# Patient Record
Sex: Female | Born: 1935
Health system: Southern US, Community
[De-identification: ages and names within clinical notes are randomized; demographics above are authoritative.]

## PROBLEM LIST (undated history)

## (undated) DIAGNOSIS — M1990 Unspecified osteoarthritis, unspecified site: Secondary | ICD-10-CM

## (undated) DIAGNOSIS — M545 Low back pain, unspecified: Secondary | ICD-10-CM

## (undated) DIAGNOSIS — K5900 Constipation, unspecified: Secondary | ICD-10-CM

## (undated) DIAGNOSIS — H04123 Dry eye syndrome of bilateral lacrimal glands: Secondary | ICD-10-CM

## (undated) DIAGNOSIS — M2540 Effusion, unspecified joint: Secondary | ICD-10-CM

## (undated) DIAGNOSIS — M2550 Pain in unspecified joint: Secondary | ICD-10-CM

## (undated) DIAGNOSIS — M810 Age-related osteoporosis without current pathological fracture: Secondary | ICD-10-CM

## (undated) DIAGNOSIS — R011 Cardiac murmur, unspecified: Secondary | ICD-10-CM

## (undated) DIAGNOSIS — E039 Hypothyroidism, unspecified: Secondary | ICD-10-CM

## (undated) DIAGNOSIS — R0602 Shortness of breath: Secondary | ICD-10-CM

## (undated) DIAGNOSIS — Z8709 Personal history of other diseases of the respiratory system: Secondary | ICD-10-CM

## (undated) DIAGNOSIS — F329 Major depressive disorder, single episode, unspecified: Secondary | ICD-10-CM

## (undated) DIAGNOSIS — R351 Nocturia: Secondary | ICD-10-CM

## (undated) DIAGNOSIS — M419 Scoliosis, unspecified: Secondary | ICD-10-CM

## (undated) DIAGNOSIS — T7840XA Allergy, unspecified, initial encounter: Secondary | ICD-10-CM

## (undated) DIAGNOSIS — I10 Essential (primary) hypertension: Secondary | ICD-10-CM

## (undated) DIAGNOSIS — R3915 Urgency of urination: Secondary | ICD-10-CM

## (undated) DIAGNOSIS — K219 Gastro-esophageal reflux disease without esophagitis: Secondary | ICD-10-CM

## (undated) DIAGNOSIS — G8929 Other chronic pain: Secondary | ICD-10-CM

## (undated) DIAGNOSIS — R32 Unspecified urinary incontinence: Secondary | ICD-10-CM

## (undated) DIAGNOSIS — R51 Headache: Secondary | ICD-10-CM

## (undated) DIAGNOSIS — H9319 Tinnitus, unspecified ear: Secondary | ICD-10-CM

## (undated) DIAGNOSIS — Z8719 Personal history of other diseases of the digestive system: Secondary | ICD-10-CM

## (undated) DIAGNOSIS — R350 Frequency of micturition: Secondary | ICD-10-CM

## (undated) DIAGNOSIS — F419 Anxiety disorder, unspecified: Secondary | ICD-10-CM

## (undated) DIAGNOSIS — F32A Depression, unspecified: Secondary | ICD-10-CM

## (undated) DIAGNOSIS — J309 Allergic rhinitis, unspecified: Secondary | ICD-10-CM

## (undated) DIAGNOSIS — R0789 Other chest pain: Secondary | ICD-10-CM

## (undated) HISTORY — PX: ESOPHAGOGASTRODUODENOSCOPY: SHX1529

## (undated) HISTORY — DX: Essential (primary) hypertension: I10

## (undated) HISTORY — PX: CATARACT EXTRACTION W/ INTRAOCULAR LENS  IMPLANT, BILATERAL: SHX1307

## (undated) HISTORY — DX: Gastro-esophageal reflux disease without esophagitis: K21.9

## (undated) HISTORY — DX: Allergic rhinitis, unspecified: J30.9

## (undated) HISTORY — DX: Depression, unspecified: F32.A

## (undated) HISTORY — DX: Major depressive disorder, single episode, unspecified: F32.9

## (undated) HISTORY — DX: Anxiety disorder, unspecified: F41.9

## (undated) HISTORY — PX: KNEE ARTHROSCOPY: SUR90

## (undated) HISTORY — PX: JOINT REPLACEMENT: SHX530

## (undated) HISTORY — DX: Allergy, unspecified, initial encounter: T78.40XA

## (undated) HISTORY — PX: COLONOSCOPY: SHX174

## (undated) HISTORY — PX: TUBAL LIGATION: SHX77

## (undated) HISTORY — PX: SHOULDER ARTHROSCOPY W/ ROTATOR CUFF REPAIR: SHX2400

## (undated) HISTORY — PX: EYE SURGERY: SHX253

## (undated) HISTORY — PX: NASAL SINUS SURGERY: SHX719

---
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
## 1997-12-25 ENCOUNTER — Encounter: Admission: RE | Admit: 1997-12-25 | Discharge: 1998-03-25 | Payer: Self-pay | Admitting: Internal Medicine

## 1998-03-29 ENCOUNTER — Other Ambulatory Visit: Admission: RE | Admit: 1998-03-29 | Discharge: 1998-03-29 | Payer: Self-pay | Admitting: Internal Medicine

## 1999-03-11 ENCOUNTER — Other Ambulatory Visit: Admission: RE | Admit: 1999-03-11 | Discharge: 1999-03-11 | Payer: Self-pay | Admitting: Internal Medicine

## 2000-04-05 ENCOUNTER — Other Ambulatory Visit: Admission: RE | Admit: 2000-04-05 | Discharge: 2000-04-05 | Payer: Self-pay | Admitting: Internal Medicine

## 2000-04-10 ENCOUNTER — Encounter: Payer: Self-pay | Admitting: Internal Medicine

## 2000-04-10 ENCOUNTER — Encounter: Admission: RE | Admit: 2000-04-10 | Discharge: 2000-04-10 | Payer: Self-pay | Admitting: Internal Medicine

## 2000-09-28 ENCOUNTER — Other Ambulatory Visit: Admission: RE | Admit: 2000-09-28 | Discharge: 2000-09-28 | Payer: Self-pay | Admitting: Internal Medicine

## 2001-05-20 ENCOUNTER — Other Ambulatory Visit: Admission: RE | Admit: 2001-05-20 | Discharge: 2001-05-20 | Payer: Self-pay | Admitting: Internal Medicine

## 2001-08-23 ENCOUNTER — Encounter: Payer: Self-pay | Admitting: Internal Medicine

## 2001-08-23 ENCOUNTER — Encounter: Admission: RE | Admit: 2001-08-23 | Discharge: 2001-08-23 | Payer: Self-pay | Admitting: Internal Medicine

## 2002-05-08 ENCOUNTER — Other Ambulatory Visit: Admission: RE | Admit: 2002-05-08 | Discharge: 2002-05-08 | Payer: Self-pay | Admitting: Internal Medicine

## 2002-12-05 ENCOUNTER — Ambulatory Visit (HOSPITAL_COMMUNITY): Admission: RE | Admit: 2002-12-05 | Discharge: 2002-12-05 | Payer: Self-pay | Admitting: Gastroenterology

## 2003-01-10 HISTORY — PX: CARDIAC CATHETERIZATION: SHX172

## 2003-05-11 ENCOUNTER — Other Ambulatory Visit: Admission: RE | Admit: 2003-05-11 | Discharge: 2003-05-11 | Payer: Self-pay | Admitting: Internal Medicine

## 2003-05-13 ENCOUNTER — Encounter: Admission: RE | Admit: 2003-05-13 | Discharge: 2003-05-13 | Payer: Self-pay | Admitting: Internal Medicine

## 2003-05-26 ENCOUNTER — Inpatient Hospital Stay (HOSPITAL_BASED_OUTPATIENT_CLINIC_OR_DEPARTMENT_OTHER): Admission: RE | Admit: 2003-05-26 | Discharge: 2003-05-26 | Payer: Self-pay | Admitting: Cardiovascular Disease

## 2004-06-28 ENCOUNTER — Other Ambulatory Visit: Admission: RE | Admit: 2004-06-28 | Discharge: 2004-06-28 | Payer: Self-pay | Admitting: Internal Medicine

## 2006-11-01 ENCOUNTER — Encounter: Admission: RE | Admit: 2006-11-01 | Discharge: 2006-11-01 | Payer: Self-pay | Admitting: Gastroenterology

## 2007-07-24 ENCOUNTER — Ambulatory Visit: Payer: Self-pay | Admitting: Cardiovascular Disease

## 2007-11-29 ENCOUNTER — Ambulatory Visit: Payer: Self-pay | Admitting: Internal Medicine

## 2007-12-31 ENCOUNTER — Ambulatory Visit: Payer: Self-pay | Admitting: Internal Medicine

## 2008-03-06 ENCOUNTER — Ambulatory Visit: Payer: Self-pay | Admitting: Internal Medicine

## 2008-03-26 ENCOUNTER — Ambulatory Visit: Payer: Self-pay | Admitting: Internal Medicine

## 2008-03-30 ENCOUNTER — Encounter: Admission: RE | Admit: 2008-03-30 | Discharge: 2008-03-30 | Payer: Self-pay | Admitting: Internal Medicine

## 2008-04-09 ENCOUNTER — Ambulatory Visit: Payer: Self-pay | Admitting: Internal Medicine

## 2008-08-20 ENCOUNTER — Ambulatory Visit: Payer: Self-pay | Admitting: Internal Medicine

## 2008-08-21 ENCOUNTER — Encounter: Admission: RE | Admit: 2008-08-21 | Discharge: 2008-08-21 | Payer: Self-pay | Admitting: Internal Medicine

## 2008-08-21 ENCOUNTER — Ambulatory Visit: Payer: Self-pay | Admitting: Internal Medicine

## 2008-08-25 ENCOUNTER — Ambulatory Visit: Payer: Self-pay | Admitting: Internal Medicine

## 2008-11-27 ENCOUNTER — Ambulatory Visit: Payer: Self-pay | Admitting: Internal Medicine

## 2009-04-16 ENCOUNTER — Ambulatory Visit: Payer: Self-pay | Admitting: Internal Medicine

## 2009-05-28 ENCOUNTER — Ambulatory Visit: Payer: Self-pay | Admitting: Internal Medicine

## 2009-10-28 ENCOUNTER — Ambulatory Visit: Payer: Self-pay | Admitting: Internal Medicine

## 2009-11-08 ENCOUNTER — Ambulatory Visit: Payer: Self-pay | Admitting: Internal Medicine

## 2009-11-30 ENCOUNTER — Ambulatory Visit: Payer: Self-pay | Admitting: Internal Medicine

## 2010-05-24 NOTE — Assessment & Plan Note (Signed)
[facility]                            CARDIOLOGY OFFICE NOTE   NAME:Terri Bridges, Terri Bridges                     MRN:          [id number]  DATE:07/24/2007                            DOB:          Jun 04, 1934    REFERRING PHYSICIAN:  [facility]   The patient referred by [facility].   The patient has had some mild dyspnea and was noted to have PACs on EKG.   In talking to the patient, she gets very infrequent palpitations.  She  has not had any in the last 3 months.  When she gets them, they are just  occasional flip-flops, they can be somewhat worse at night.   She has had a previous cardiac workup.  She saw Dr. Daleen Squibb in 2005.  I did  her heart cath.  She had normal coronary arteries.  Her chest pain  syndrome at that time was thought to be noncardiac.   The patient's coronary risk factors include hypertension which she takes  Cardizem for.  She is a nonsmoker and non-diabetic.   FAMILY HISTORY:  Only positive for hypertension not premature coronary  disease.   The patient's dyspnea seems functional.  She has actually raised her 21-  year-old granddaughter who is a left lower extremity amputee.  She has  not had PND or orthopnea.  There has been no lower extremity edema.   She has not had any syncope.   REVIEW OF SYSTEMS:  Otherwise negative.   PAST MEDICAL HISTORY:  Remarkable for history of hypertension, history  of hypothyroidism, history of anxiety, and depression.   The patient has a history of reflux.   PAST SURGICAL HISTORY:  She has had previous sinus surgery.   Family history is remarkable for hypertension on both sides of the  family.   I reviewed her notes from Dr. wall and my own heart cath from May 26, 2003.  The patient had normal LV function, normal coronary arteries with  an LVDP of 9.   The patient lives by herself.  She has raised her 74 year old  granddaughter by herself.  The granddaughter's  parents are not involved  with her care and the father is in jail.  The patient does have six  children total.  She is going to visit one of her sons in [place]  for the next 2-3 weeks.   ALLERGIES:  She has no known allergies.   MEDICATIONS:  1. Sertraline 100 a day.  2. Omeprazole 20 a day.  3. Cardizem 240 a day.  4. Synthroid 150 mcg a day.   The patient is anxious to get back to the care of Dr. Lenord Fellers.  Apparently, there was a transition period where Dr. Lenord Fellers was no longer  taking Medicare; however, since she is part of the extended [facility]  group now, I think that the patient go back to seeing her in October  2009.   PHYSICAL EXAMINATION:  GENERAL:  Her exam is remarkable for healthy-  appearing elderly white female in no distress.  Affect appropriate.  VITAL SIGNS:  Blood pressure is equal  to 140/80, pulse 70 and regular,  no PACs, respiratory rate 14, afebrile, weight 190.  HEENT:  Unremarkable.  NECK:  Carotids are normal without bruit.  No lymphadenopathy,  thyromegaly, or JVP elevation.  LUNGS:  Clear.  Good diaphragmatic motion.  No wheezing.  S1 and S2.  Normal heart sounds.  PMI normal.  ABDOMEN:  Benign.  Bowel sounds positive.  No AAA, no tenderness, no  bruit, no hepatosplenomegaly, no hepatojugular reflux, no tenderness.  Distal pulses were intact.  NEURO:  Nonfocal.  SKIN:  Warm and dry.  MUSCULOSKELETAL:  No muscular weakness.   EKG shows sinus rhythm with poor R-wave progression.   There has been no change from her EKG done in June 2005.   I reviewed lab work done at [facility].   The patient's LDL was 106, crit was 38.7, serum glucose was 94,  creatinine was 0.85   Liver function tests appeared normal.  TSH was 0.199.  Apparently, the  patient's Synthroid dose was thus decreased to 75 mcg a day.   IMPRESSION:  1. Premature atrial contractions and/or palpitations.  The patient has      a history of benign palpitations.   Premature atrial contractions      may be exacerbated by inappropriate Synthroid dose.  We will leave      this up to Dr. Lenord Fellers and her primary medical doctors to adjust      Synthroid based on normalizing TSH.  The patient does not need      further workup or event monitoring.  2. Dyspnea, functional.  The patient is overweight.  There is no      evidence of cardiopulmonary disease.  She has a normal exam.      Recommended increased exercise levels.  3. History of anxiety/depression.  Continue sertraline, reactive      secondary to issues regarding raising her granddaughter.  4. History of reflux.  Continue omeprazole.  Avoid late-night meals.      Encourage weight loss.  5. Hypertension.  Continue Cardizem 240 a day, currently seems under      good control.   Overall, I think the patient's cardiac status is stable.  I do not think  her palpitations, dyspnea, or PACs reflect underlying cardiac disease.  We have normal cath and no evidence of structural heart disease as  recent as in 2005.   The patient will try to get back in with Dr. Lenord Fellers regarding her  primary care needs.     Noralyn Pick. Eden Emms, MD, [facility]  Electronically Signed    PCN/MedQ  DD: 07/24/2007  DT: 07/24/2007  Job #: [id number]

## 2010-05-27 NOTE — Cardiovascular Report (Signed)
NAME:  Terri Bridges, Terri Bridges                        ACCOUNT NO.:  [id number]   MEDICAL RECORD NO.:  [id number]                   PATIENT TYPE:  OIB   LOCATION:  6501                                 FACILITY:  [facility]   PHYSICIAN:  Charlton Haws, M.D.                  DATE OF BIRTH:  1934-06-05   DATE OF PROCEDURE:  05/26/2003  DATE OF DISCHARGE:                              CARDIAC CATHETERIZATION   INDICATION:  The patient has had recurrent chest pain and dyspnea.   PROCEDURE:  Standard catheterization was done from the right femoral artery.   The left main coronary artery was normal.   The left anterior descending artery was a very large artery with two large  diagonal branches.  It was normal.   The circumflex coronary artery was also large.  There were two large obtuse  marginal branches.  They were normal.   The right coronary artery was large with two large posterior lateral  branches.  It was normal.   RAO VENTRICULOGRAPHY:  RAO ventriculography was normal.  Ejection fraction  was hyperdynamic in the 65% range. There was no gradient across the aortic  valve and no MR.  Aortic pressure was 144/74, LV pressure was in the 150/9  range.   IMPRESSION:  The patient's chest pain would appear to be noncardiac in  etiology.   She will follow up with Dr. Lenord Fellers in regards to further care.  She will be  discharged in about three hours as long as her groin heals well.                                               Charlton Haws, M.D.    PN/MEDQ  D:  05/26/2003  T:  05/26/2003  Job:  [id number]   cc:   Luanna Cole. Lenord Fellers, M.D.  [address], [place]  [place]  [place] [address]  Fax: [phone]   Jesse Sans. Wall, M.D.

## 2010-05-27 NOTE — Op Note (Signed)
NAME:  Terri Bridges, Terri Bridges                    ACCOUNT NO.:  [id number]   MEDICAL RECORD NO.:  [id number]                   PATIENT TYPE:  AMB   LOCATION:  ENDO                                 FACILITY:  [facility]   PHYSICIAN:  James L. Malon Kindle., M.D.          DATE OF BIRTH:  [birth year]   DATE OF PROCEDURE:  12/05/2002  DATE OF DISCHARGE:                                 OPERATIVE REPORT   PROCEDURE:  Esophagogastroduodenoscopy.   MEDICATIONS:  Fentanyl 50 mcg and Versed 5 mg IV.   INDICATIONS:  The patient with a history of asthma, persistent esophageal  reflux, has tried multiple medications.  Continues to have burning.  Takes a  lot of [address]  She has never had an endoscopy.  This is done as part of an  evaluation for reflux.   DESCRIPTION OF PROCEDURE:  The procedure explained to the patient and  consent obtained.  With the patient in the left lateral decubitus position,  the Olympus scope was then inserted and advanced.  We advanced into the  stomach.  The pylorus was identified and passed.  The bulb and second  duodenum were completely normal.  The scope was withdrawn, and the port and  channel was normal.  The antrum and body were normal.  The fundus and cardia  were seen well on the retroflexed view and were normal.  The diaphragmatic  hiatus was located at 39 cm.  There was a large hiatal hernia estimated to  be about 9 cm with the Z-line at approximately 30 cm.  There were several  erosions in the hiatal hernia sac, consistent with esophageal reflux.  The  distal and proximal esophagus were endoscopically normal.  The scope was  withdrawn.  The patient tolerated the procedure well.   ASSESSMENT:  Large hiatal hernia with erosions secondary to gastroesophageal  reflux disease, code 530.81.   PLAN:  Will give a reflux instruction sheet.  Given Nexium b.i.d. and Reglan  q.i.d. and see back in the office in three months.  If her symptoms are not  controllable, we will discuss  an antireflux operation with her at that time.                                               James L. Malon Kindle., M.D.    Waldron Session  D:  12/05/2002  T:  12/05/2002  Job:  [id number]   cc:   Jessica Priest, M.D.  [address]  [place] [address]  Fax: [phone]   Luanna Cole. Lenord Fellers, M.D.  [address] [place]  [place]  [place] [address]  Fax: [phone]

## 2010-06-02 ENCOUNTER — Other Ambulatory Visit: Payer: Self-pay

## 2010-06-03 ENCOUNTER — Encounter: Payer: Self-pay | Admitting: Internal Medicine

## 2010-06-03 ENCOUNTER — Ambulatory Visit (INDEPENDENT_AMBULATORY_CARE_PROVIDER_SITE_OTHER): Payer: Medicare Other | Admitting: Internal Medicine

## 2010-06-03 VITALS — BP 136/84 | HR 84 | Temp 98.1°F | Wt 164.0 lb

## 2010-06-03 DIAGNOSIS — F329 Major depressive disorder, single episode, unspecified: Secondary | ICD-10-CM

## 2010-06-03 DIAGNOSIS — F411 Generalized anxiety disorder: Secondary | ICD-10-CM

## 2010-06-03 DIAGNOSIS — E039 Hypothyroidism, unspecified: Secondary | ICD-10-CM

## 2010-06-03 DIAGNOSIS — F419 Anxiety disorder, unspecified: Secondary | ICD-10-CM

## 2010-06-03 MED ORDER — SERTRALINE HCL 100 MG PO TABS: 100.0000 mg | ORAL_TABLET | Freq: Every day | ORAL | Status: DC

## 2010-06-03 MED ORDER — LEVOTHYROXINE SODIUM 150 MCG PO TABS: 150.0000 ug | ORAL_TABLET | Freq: Every day | ORAL | Status: DC

## 2010-06-03 MED ORDER — ALPRAZOLAM 0.25 MG PO TABS: 0.5000 mg | ORAL_TABLET | Freq: Two times a day (BID) | ORAL | Status: DC | PRN

## 2010-06-03 NOTE — Patient Instructions (Signed)
Restart Xanax and Zoloft. Continue all other meds. See again in 6 months. Call Orthopedist about shoulder pain.

## 2010-06-03 NOTE — Progress Notes (Signed)
Addended by: Daisy Blossom on: 06/03/2010 12:49 PM   Modules accepted: Orders

## 2010-06-03 NOTE — Progress Notes (Signed)
  Subjective:    Patient ID: Terri Bridges, female    DOB: May 24, 1934, 76 y.o.   MRN: [id number]  HPI patient is here for six-month followup. Blood pressure under reasonably good control with 2 agents listed. She has hypothyroidism. TSH drawn today. Continues to have issues with benign positional vertigo. She  is now of Zoloft and Xanax. 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 Continues to take omeprazole for GE reflux. Has shoulder pain. Needs to contact orthopedist for medication for that issue. Has had shoulder surgery in the recent past.    Review of Systems     Objective:   Physical Exam neck: No thyromegaly chest: Clear to auscultation cardiac: Regular rate and rhythm normal S1 and S2 extremities: Without edema        Assessment & Plan:    #1 hypothyroidism-TSH pending  #2 GE reflux-continue omeprazole  #3 benign positional vertigo-renew Xanax        #4 depression-represcribed Zoloft  #5 hypertension-continue current medications  Return in 6 months for physical examination

## 2010-07-29 ENCOUNTER — Other Ambulatory Visit: Payer: Self-pay | Admitting: Internal Medicine

## 2010-09-16 ENCOUNTER — Other Ambulatory Visit: Payer: Self-pay | Admitting: Specialist

## 2010-09-16 ENCOUNTER — Encounter (HOSPITAL_COMMUNITY): Payer: Medicare Other

## 2010-09-16 ENCOUNTER — Ambulatory Visit (HOSPITAL_COMMUNITY)
Admission: RE | Admit: 2010-09-16 | Discharge: 2010-09-16 | Disposition: A | Discharge status: Home / Self Care | Payer: Medicare Other | Source: Ambulatory Visit | Attending: Specialist | Admitting: Specialist

## 2010-09-16 ENCOUNTER — Other Ambulatory Visit (HOSPITAL_COMMUNITY): Payer: Self-pay | Admitting: Specialist

## 2010-09-16 DIAGNOSIS — Z01812 Encounter for preprocedural laboratory examination: Secondary | ICD-10-CM | POA: Insufficient documentation

## 2010-09-16 DIAGNOSIS — M19019 Primary osteoarthritis, unspecified shoulder: Secondary | ICD-10-CM | POA: Insufficient documentation

## 2010-09-16 DIAGNOSIS — X58XXXA Exposure to other specified factors, initial encounter: Secondary | ICD-10-CM | POA: Insufficient documentation

## 2010-09-16 DIAGNOSIS — R9431 Abnormal electrocardiogram [ECG] [EKG]: Secondary | ICD-10-CM | POA: Insufficient documentation

## 2010-09-16 DIAGNOSIS — S43429A Sprain of unspecified rotator cuff capsule, initial encounter: Secondary | ICD-10-CM | POA: Insufficient documentation

## 2010-09-16 DIAGNOSIS — Z01818 Encounter for other preprocedural examination: Secondary | ICD-10-CM

## 2010-09-16 DIAGNOSIS — Z01810 Encounter for preprocedural cardiovascular examination: Secondary | ICD-10-CM | POA: Insufficient documentation

## 2010-09-16 LAB — CBC
MCH: 26.2 pg (ref 26.0–34.0)
MCHC: 32.1 g/dL (ref 30.0–36.0)
MCV: 81.7 fL (ref 78.0–100.0)
Platelets: 196 10*3/uL (ref 150–400)
RBC: 4.58 MIL/uL (ref 3.87–5.11)
RDW: 13.9 % (ref 11.5–15.5)

## 2010-09-16 LAB — BASIC METABOLIC PANEL
CO2: 31 mEq/L (ref 19–32)
Calcium: 10 mg/dL (ref 8.4–10.5)
Creatinine, Ser: 1.41 mg/dL — ABNORMAL HIGH (ref 0.50–1.10)

## 2010-09-16 LAB — SURGICAL PCR SCREEN: MRSA, PCR: NEGATIVE

## 2010-09-22 ENCOUNTER — Ambulatory Visit (HOSPITAL_COMMUNITY)
Admission: RE | Admit: 2010-09-22 | Discharge: 2010-09-24 | Disposition: A | Discharge status: Home / Self Care | Payer: Medicare Other | Source: Ambulatory Visit | Attending: Specialist | Admitting: Specialist

## 2010-09-22 DIAGNOSIS — I10 Essential (primary) hypertension: Secondary | ICD-10-CM | POA: Insufficient documentation

## 2010-09-22 DIAGNOSIS — X58XXXA Exposure to other specified factors, initial encounter: Secondary | ICD-10-CM | POA: Insufficient documentation

## 2010-09-22 DIAGNOSIS — Z79899 Other long term (current) drug therapy: Secondary | ICD-10-CM | POA: Insufficient documentation

## 2010-09-22 DIAGNOSIS — M19019 Primary osteoarthritis, unspecified shoulder: Secondary | ICD-10-CM | POA: Insufficient documentation

## 2010-09-22 DIAGNOSIS — J45909 Unspecified asthma, uncomplicated: Secondary | ICD-10-CM | POA: Insufficient documentation

## 2010-09-22 DIAGNOSIS — Z01812 Encounter for preprocedural laboratory examination: Secondary | ICD-10-CM | POA: Insufficient documentation

## 2010-09-22 DIAGNOSIS — S43429A Sprain of unspecified rotator cuff capsule, initial encounter: Secondary | ICD-10-CM | POA: Insufficient documentation

## 2010-09-22 LAB — BASIC METABOLIC PANEL
Calcium: 9.1 mg/dL (ref 8.4–10.5)
GFR calc Af Amer: 46 mL/min — ABNORMAL LOW (ref >60)
GFR calc non Af Amer: 38 mL/min — ABNORMAL LOW (ref >60)
Glucose, Bld: 99 mg/dL (ref 70–99)
Potassium: 3.9 mEq/L (ref 3.5–5.1)
Sodium: 142 mEq/L (ref 135–145)

## 2010-09-23 LAB — BASIC METABOLIC PANEL
Chloride: 103 mEq/L (ref 96–112)
GFR calc Af Amer: >60 mL/min (ref >60)
GFR calc non Af Amer: 52 mL/min — ABNORMAL LOW (ref >60)
Potassium: 3.5 mEq/L (ref 3.5–5.1)
Sodium: 138 mEq/L (ref 135–145)

## 2010-09-30 NOTE — Op Note (Signed)
Terri Bridges, Terri Bridges NO.:  [id number]  MEDICAL RECORD NO.:  [id number]  LOCATION:  DAYL                         FACILITY:  [facility]  PHYSICIAN:  Jene Every, M.D.    DATE OF BIRTH:  10/11/1934  DATE OF PROCEDURE:  09/22/2010 DATE OF DISCHARGE:                              OPERATIVE REPORT   PREOPERATIVE DIAGNOSES: 1. Rotator cuff tear, right shoulder. 2. Acromioclavicular joint arthrosis.  POSTOPERATIVE DIAGNOSES: 1. Rotator cuff tear, right shoulder. 2. Acromioclavicular joint arthrosis.  PROCEDURES PERFORMED: 1. Mini-open rotator cuff repair and subscapular tendon with a     JuggerKnot suture anchor. 2. Subacromial decompression and distal clavicle resection.  ANESTHESIA:  General.  ASSISTANT:  Roma Schanz, PA  BRIEF HISTORY:  A 76 year old with shoulder pain, retracted tear of the rotator cuff, severe AC arthrosis; indicated for subacromial decompression, repair of the rotator cuff, distal clavicle resection. Discussed the risks and benefits including bleeding, infection, no change in symptoms, worsening symptoms, need for protracted recovery, DVT, PE, anesthetic complications, etc.  TECHNIQUE:  The patient in supine beach-chair position, after induction of adequate general anesthesia and 1 g Kefzol, left shoulder was prepped and draped in the usual sterile fashion.  She actually had good range of motion in the exam.  Made an incision over the anterolateral aspect of the acromion towards the [facility] joint.  Attention turned towards first the [facility].  We incised the capsule; preserved it; skeletonized the distal clavicle; protected anteriorly, posteriorly, and inferiorly with mini Hohmann retractors.  Used an oscillating saw to remove a centimeter of the distal clavicle, which was severely arthritic.  There were degenerative changes of synovial fluid.  We performed a distal clavicle resection, preserving the coracoacromial ligaments.  Then irrigated  the joint and placed bone wax on the cancellous surface.  Next, I repaired the capsule given the deltotrapezial fascia with 2-0 Vicryl simple sutures.  I then turned attention to the raphe between the anterolateral heads of the deltoid and divided a centimeter and half of the anterolateral aspect of the acromion, placed a deep retractor. Subperiosteally elevated the deltoid off the anterolateral and anteromedial aspect of the acromion, preserving all its attachment.  I removed a spur with a 3 mm Kerrison.  Preserved the AC ligament, looked through the CA ligament.  I irrigated the joint and lavaged it.  There was complete absence of the rotator cuff tendon on the humeral head other than some portion of the subscap.  We thoroughly inspected the joint.  We were able to find the retracted supraspinatus.  Unable to find the infraspinatus.  We advanced the portion of the tendon only that was available which was the subscap.  No significant tear of the upper portion of the subscap.  Biceps tendon was ruptured, not evident. Repaired the subscap tendon utilizing a JuggerKnot suture anchor just medial to the bicipital groove after preparing it with A.O. elevator and a Beyer rongeur to good bleeding tissue.  Mobilized the subscap in the neutral position and placed a suture anchor in this position, threaded both of the suture up through the subscap tendon and with the subscap tendon held in the neutral position, we secured it with 2  good surgical knots.  Felt this completed the repair of the subscap in that neutral position.  Again, the remainder of the cuff was unrepairable.  Wound copiously irrigated.  Following this, I then repaired the raphe with #1 Vicryl interrupted figure-of-8 sutures over the top and through the acromion where I performed a bursectomy as well.  Subcu with 2-0 Vicryl simple sutures.  Skin was reapproximated with 4-0 subcuticular Prolene. Wound reinforced with Steri-Strips.   Sterile dressing applied, placed an abduction pillow, extubated without difficulty, and transported to the recovery room in satisfactory condition.  The patient tolerated the procedure well.  No complications.  Minimal blood loss.     Jene Every, M.D.     Cordelia Pen  D:  09/22/2010  T:  09/22/2010  Job:  [id number]  Electronically Signed by Jene Every M.D. on 09/30/2010 03:10:53 PM

## 2010-12-06 ENCOUNTER — Other Ambulatory Visit: Payer: Medicare Other | Admitting: Internal Medicine

## 2010-12-06 DIAGNOSIS — E039 Hypothyroidism, unspecified: Secondary | ICD-10-CM

## 2010-12-06 DIAGNOSIS — E559 Vitamin D deficiency, unspecified: Secondary | ICD-10-CM

## 2010-12-06 DIAGNOSIS — I10 Essential (primary) hypertension: Secondary | ICD-10-CM

## 2010-12-06 LAB — CBC WITH DIFFERENTIAL/PLATELET
Basophils Relative: 1 % (ref 0–1)
HCT: 37.3 % (ref 36.0–46.0)
Hemoglobin: 11.9 g/dL — ABNORMAL LOW (ref 12.0–15.0)
Lymphocytes Relative: 24 % (ref 12–46)
Monocytes Absolute: 0.5 10*3/uL (ref 0.1–1.0)
Monocytes Relative: 9 % (ref 3–12)
Neutro Abs: 3.2 10*3/uL (ref 1.7–7.7)
Neutrophils Relative %: 59 % (ref 43–77)
RBC: 4.5 MIL/uL (ref 3.87–5.11)
WBC: 5.4 10*3/uL (ref 4.0–10.5)

## 2010-12-06 LAB — COMPREHENSIVE METABOLIC PANEL
AST: 18 U/L (ref 0–37)
Albumin: 4 g/dL (ref 3.5–5.2)
Alkaline Phosphatase: 63 U/L (ref 39–117)
BUN: 25 mg/dL — ABNORMAL HIGH (ref 6–23)
Calcium: 9.4 mg/dL (ref 8.4–10.5)
Chloride: 104 mEq/L (ref 96–112)
Glucose, Bld: 84 mg/dL (ref 70–99)
Potassium: 4.4 mEq/L (ref 3.5–5.3)
Sodium: 144 mEq/L (ref 135–145)
Total Protein: 6.1 g/dL (ref 6.0–8.3)

## 2010-12-06 LAB — LIPID PANEL
HDL: 64 mg/dL (ref >39)
LDL Cholesterol: 145 mg/dL — ABNORMAL HIGH (ref 0–99)
Triglycerides: 74 mg/dL (ref <150)

## 2010-12-07 LAB — VITAMIN D 25 HYDROXY (VIT D DEFICIENCY, FRACTURES): Vit D, 25-Hydroxy: 37 ng/mL (ref 30–89)

## 2010-12-08 ENCOUNTER — Ambulatory Visit (INDEPENDENT_AMBULATORY_CARE_PROVIDER_SITE_OTHER): Payer: Medicare Other | Admitting: Internal Medicine

## 2010-12-08 ENCOUNTER — Encounter: Payer: Self-pay | Admitting: Internal Medicine

## 2010-12-08 VITALS — BP 130/82 | HR 68 | Temp 98.3°F | Ht 61.5 in | Wt 156.0 lb

## 2010-12-08 DIAGNOSIS — M8580 Other specified disorders of bone density and structure, unspecified site: Secondary | ICD-10-CM

## 2010-12-08 DIAGNOSIS — J329 Chronic sinusitis, unspecified: Secondary | ICD-10-CM

## 2010-12-08 DIAGNOSIS — I10 Essential (primary) hypertension: Secondary | ICD-10-CM

## 2010-12-08 DIAGNOSIS — Z8719 Personal history of other diseases of the digestive system: Secondary | ICD-10-CM

## 2010-12-08 DIAGNOSIS — J449 Chronic obstructive pulmonary disease, unspecified: Secondary | ICD-10-CM

## 2010-12-08 DIAGNOSIS — F329 Major depressive disorder, single episode, unspecified: Secondary | ICD-10-CM

## 2010-12-08 DIAGNOSIS — R7989 Other specified abnormal findings of blood chemistry: Secondary | ICD-10-CM

## 2010-12-08 DIAGNOSIS — J309 Allergic rhinitis, unspecified: Secondary | ICD-10-CM

## 2010-12-08 DIAGNOSIS — E039 Hypothyroidism, unspecified: Secondary | ICD-10-CM

## 2010-12-08 DIAGNOSIS — F3289 Other specified depressive episodes: Secondary | ICD-10-CM

## 2010-12-08 DIAGNOSIS — R799 Abnormal finding of blood chemistry, unspecified: Secondary | ICD-10-CM

## 2010-12-08 DIAGNOSIS — J4489 Other specified chronic obstructive pulmonary disease: Secondary | ICD-10-CM

## 2010-12-08 DIAGNOSIS — M899 Disorder of bone, unspecified: Secondary | ICD-10-CM

## 2010-12-08 LAB — POCT URINALYSIS DIPSTICK
Glucose, UA: NEGATIVE
Nitrite, UA: NEGATIVE
Protein, UA: NEGATIVE
Urobilinogen, UA: NEGATIVE

## 2010-12-09 ENCOUNTER — Other Ambulatory Visit: Payer: Self-pay | Admitting: Internal Medicine

## 2010-12-20 ENCOUNTER — Other Ambulatory Visit: Payer: Medicare Other | Admitting: Internal Medicine

## 2010-12-20 DIAGNOSIS — I10 Essential (primary) hypertension: Secondary | ICD-10-CM

## 2010-12-20 LAB — BASIC METABOLIC PANEL
Calcium: 9.1 mg/dL (ref 8.4–10.5)
Potassium: 4.5 mEq/L (ref 3.5–5.3)
Sodium: 144 mEq/L (ref 135–145)

## 2010-12-22 ENCOUNTER — Encounter: Payer: Self-pay | Admitting: Internal Medicine

## 2010-12-22 ENCOUNTER — Ambulatory Visit (INDEPENDENT_AMBULATORY_CARE_PROVIDER_SITE_OTHER): Payer: Medicare Other | Admitting: Internal Medicine

## 2010-12-22 VITALS — BP 188/44 | HR 88 | Temp 99.0°F | Resp 24 | Wt 156.0 lb

## 2010-12-22 DIAGNOSIS — R7989 Other specified abnormal findings of blood chemistry: Secondary | ICD-10-CM

## 2010-12-22 DIAGNOSIS — R799 Abnormal finding of blood chemistry, unspecified: Secondary | ICD-10-CM

## 2010-12-22 DIAGNOSIS — I10 Essential (primary) hypertension: Secondary | ICD-10-CM

## 2011-01-05 ENCOUNTER — Ambulatory Visit (INDEPENDENT_AMBULATORY_CARE_PROVIDER_SITE_OTHER): Payer: Medicare Other | Admitting: Internal Medicine

## 2011-01-05 ENCOUNTER — Other Ambulatory Visit: Payer: Self-pay | Admitting: Internal Medicine

## 2011-01-05 ENCOUNTER — Encounter: Payer: Self-pay | Admitting: Internal Medicine

## 2011-01-05 VITALS — BP 150/86 | HR 88 | Temp 98.4°F | Wt 158.0 lb

## 2011-01-05 DIAGNOSIS — M949 Disorder of cartilage, unspecified: Secondary | ICD-10-CM

## 2011-01-05 DIAGNOSIS — F329 Major depressive disorder, single episode, unspecified: Secondary | ICD-10-CM

## 2011-01-05 DIAGNOSIS — E039 Hypothyroidism, unspecified: Secondary | ICD-10-CM

## 2011-01-05 DIAGNOSIS — M8580 Other specified disorders of bone density and structure, unspecified site: Secondary | ICD-10-CM

## 2011-01-05 DIAGNOSIS — K219 Gastro-esophageal reflux disease without esophagitis: Secondary | ICD-10-CM

## 2011-01-05 DIAGNOSIS — J309 Allergic rhinitis, unspecified: Secondary | ICD-10-CM | POA: Insufficient documentation

## 2011-01-05 DIAGNOSIS — J329 Chronic sinusitis, unspecified: Secondary | ICD-10-CM | POA: Insufficient documentation

## 2011-01-05 DIAGNOSIS — I10 Essential (primary) hypertension: Secondary | ICD-10-CM

## 2011-01-05 NOTE — Progress Notes (Signed)
  Subjective:    Patient ID: Terri Bridges, female    DOB: 1934/02/03, 76 y.o.   MRN: [id number]  HPI patient here today to followup on hypertension. Says she has another sinus infection with a lot of postnasal drip and discolored sputum production. Says that since decreasing amlodipine to 5 mg daily she has felt much better. When she was taking 10 mg daily it made her extremely fatigued. We had to take her off lisinopril HCTZ 20/25 because of elevated creatinine at 1.6. Creatinine improved off this medication. I then had her on Lasix and amlodipine 10 mg daily but the 10 mg of amlodipine made her fatigued. We reduced her to 5 mg of amlodipine and continued with Lasix and she is now here to followup on that. Blood pressure still elevated at 150/90. We're going to need to add an additional medication for better blood pressure control. I rechecked it several times a day in both arms.    Review of Systems     Objective:   Physical Exam HEENT exam: TMs are slightly full; pharynx slightly injected; neck is supple without thyromegaly or adenopathy; chest clear; cardiac exam regular rate and rhythm; extremities without edema        Assessment & Plan:  Recurrent sinusitis-patient has long-standing history of this  Allergic rhinitis  Hypertension  Anxiety depression  Hypothyroidism  Plan: And Cozaar 50 mg daily 2 Lasix and amlodipine 5 mg daily. Return in 4 weeks at which time she'll need be met. Am reluctant to add beta blocker with history of respiratory issues. She is a former smoker and likely has some component of COPD. Have her metoprolol would be a possibility if Cozaar cannot be tolerated or increases her creatinine.  Prescription for Levaquin 500 milligrams daily for 10 days for recurrent sinusitis. Patient says she's going to have cataract extraction early January 2013 left eye.

## 2011-01-05 NOTE — Patient Instructions (Signed)
Take Levaquin 500 milligrams daily for 10 days for recurrent sinusitis. Start Cozaar 50 mg daily for hypertension control in addition to Lasix and Norvasc 5 mg daily. Return in 4 weeks

## 2011-01-08 ENCOUNTER — Encounter: Payer: Self-pay | Admitting: Internal Medicine

## 2011-01-08 DIAGNOSIS — J449 Chronic obstructive pulmonary disease, unspecified: Secondary | ICD-10-CM | POA: Insufficient documentation

## 2011-01-08 NOTE — Patient Instructions (Signed)
Continue same medications and return in 6 months 

## 2011-01-08 NOTE — Progress Notes (Signed)
  Subjective:    Patient ID: Franki Cabot, female    DOB: 27-Apr-1934, 76 y.o.   MRN: [id number]  HPI 76 year old white female in today for followup on hypertension. Last visit we increased amlodipine. She is complaining of extreme malaise and fatigue. Doesn't feel like doing anything. Just doesn't feel well. No energy to do anything. We had to discontinue lisinopril/HCTZ because of elevated creatinine.    Review of Systems     Objective:   Physical Exam neck supple without thyromegaly or carotid bruits; chest clear; cardiac exam regular rate and rhythm; extremities trace lower M.D. edema        Assessment & Plan:  Fatigue secondary to amlodipine increased dose  Hypertension  History of recurrent sinusitis  History of depression  History of hypothyroidism  Plan: Decrease amlodipine 5 mg daily and return in 2 weeks. May need to add another medication to control blood pressure.

## 2011-01-08 NOTE — Patient Instructions (Signed)
Decrease amlodipine to 5 mg daily and return in 2 weeks.

## 2011-01-09 ENCOUNTER — Encounter: Payer: Self-pay | Admitting: Internal Medicine

## 2011-01-09 NOTE — Progress Notes (Signed)
  Subjective:    Patient ID: Terri Bridges, female    DOB: Dec 25, 1934, 76 y.o.   MRN: [id number]  HPI 76 year old white female with history of recurrent sinus infections, allergic rhinitis, hypothyroidism, hypertension, depression, fatigue, GE reflux for annual exam. Recent fasting lab work showed elevated creatinine on ACE inhibitor. This is of some concern to me. Patient is raising her teenage granddaughter alone. Granddaughter has  a congenital  deformity and has had multiple surgeries. Patient struggles financially. Is retired. Several children but they don't seem to help her very much. Some of these children have addiction issues.    Review of Systems  Constitutional: Positive for fatigue.  HENT: Positive for congestion and postnasal drip.   Eyes: Negative.   Respiratory: Positive for cough.   Musculoskeletal: Positive for arthralgias.  Neurological: Positive for light-headedness.  Psychiatric/Behavioral: Positive for dysphoric mood.       Objective:   Physical Exam  Vitals reviewed. Constitutional: She is oriented to person, place, and time. She appears well-nourished.  HENT:  Head: Normocephalic and atraumatic.  Mouth/Throat: No oropharyngeal exudate.       Chronic scarring both the ears  Eyes: EOM are normal. Pupils are equal, round, and reactive to light. Right eye exhibits no discharge. Left eye exhibits no discharge. No scleral icterus.  Neck: Neck supple. No JVD present. No thyromegaly present.  Cardiovascular: Normal rate, regular rhythm, normal heart sounds and intact distal pulses.   No murmur heard. Pulmonary/Chest: Breath sounds normal. She has no wheezes. She has no rales.       Breasts normal female  Abdominal: Soft. Bowel sounds are normal. She exhibits no distension and no mass. There is no tenderness. There is no rebound and no guarding.  Genitourinary:       Bimanual normal  Musculoskeletal: Normal range of motion. She exhibits no edema.  Lymphadenopathy:      She has no cervical adenopathy.  Neurological: She is alert and oriented to person, place, and time. She has normal reflexes. No cranial nerve deficit. Coordination normal.  Skin: No rash noted. She is not diaphoretic.  Psychiatric:       Always has depressed affect          Assessment & Plan:  Elevated serum creatinine on ACE inhibitor  Other medical problems stable  Plan decrease ACE inhibitor with HCTZ. Try Lasix 20 mg daily with Norvasc 5 mg daily. Return in 2 weeks.

## 2011-02-02 ENCOUNTER — Ambulatory Visit (INDEPENDENT_AMBULATORY_CARE_PROVIDER_SITE_OTHER): Payer: Medicare Other | Admitting: Internal Medicine

## 2011-02-02 ENCOUNTER — Encounter: Payer: Self-pay | Admitting: Internal Medicine

## 2011-02-02 DIAGNOSIS — Z1211 Encounter for screening for malignant neoplasm of colon: Secondary | ICD-10-CM

## 2011-02-02 DIAGNOSIS — R5381 Other malaise: Secondary | ICD-10-CM

## 2011-02-02 DIAGNOSIS — I10 Essential (primary) hypertension: Secondary | ICD-10-CM

## 2011-02-02 DIAGNOSIS — R5383 Other fatigue: Secondary | ICD-10-CM

## 2011-02-02 LAB — BASIC METABOLIC PANEL
CO2: 28 mEq/L (ref 19–32)
Calcium: 8.6 mg/dL (ref 8.4–10.5)
Chloride: 106 mEq/L (ref 96–112)
Glucose, Bld: 86 mg/dL (ref 70–99)
Potassium: 4.1 mEq/L (ref 3.5–5.3)
Sodium: 141 mEq/L (ref 135–145)

## 2011-02-07 ENCOUNTER — Other Ambulatory Visit: Payer: Self-pay | Admitting: Internal Medicine

## 2011-02-21 ENCOUNTER — Ambulatory Visit (INDEPENDENT_AMBULATORY_CARE_PROVIDER_SITE_OTHER): Payer: Medicare Other | Admitting: Internal Medicine

## 2011-02-21 ENCOUNTER — Encounter: Payer: Self-pay | Admitting: Internal Medicine

## 2011-02-21 VITALS — BP 136/74 | HR 58 | Temp 99.1°F | Wt 160.0 lb

## 2011-02-21 DIAGNOSIS — H6691 Otitis media, unspecified, right ear: Secondary | ICD-10-CM

## 2011-02-21 DIAGNOSIS — J329 Chronic sinusitis, unspecified: Secondary | ICD-10-CM

## 2011-02-21 DIAGNOSIS — I10 Essential (primary) hypertension: Secondary | ICD-10-CM

## 2011-02-21 DIAGNOSIS — H7290 Unspecified perforation of tympanic membrane, unspecified ear: Secondary | ICD-10-CM

## 2011-02-21 DIAGNOSIS — H6690 Otitis media, unspecified, unspecified ear: Secondary | ICD-10-CM

## 2011-02-21 DIAGNOSIS — H7291 Unspecified perforation of tympanic membrane, right ear: Secondary | ICD-10-CM

## 2011-02-21 LAB — CBC WITH DIFFERENTIAL/PLATELET
Basophils Relative: 0 % (ref 0–1)
Eosinophils Relative: 6 % — ABNORMAL HIGH (ref 0–5)
HCT: 36.6 % (ref 36.0–46.0)
Hemoglobin: 11.4 g/dL — ABNORMAL LOW (ref 12.0–15.0)
MCH: 25.4 pg — ABNORMAL LOW (ref 26.0–34.0)
MCHC: 31.1 g/dL (ref 30.0–36.0)
MCV: 81.5 fL (ref 78.0–100.0)
Monocytes Absolute: 0.6 10*3/uL (ref 0.1–1.0)
Monocytes Relative: 8 % (ref 3–12)
Neutro Abs: 5.5 10*3/uL (ref 1.7–7.7)

## 2011-02-21 MED ORDER — CEFTRIAXONE SODIUM 1 G IJ SOLR
1.0000 g | Freq: Once | INTRAMUSCULAR | Status: AC
  Administered 2011-02-21: 1 g via INTRAMUSCULAR

## 2011-02-21 NOTE — Patient Instructions (Signed)
Take Avelox 400 mg daily for 6 days. Samples provided. He had been given an injection of 1 g IM Rocephin today. Use prescription for either drops in right external ear canal 4 drops 4 times a day for 5-7 days.

## 2011-02-21 NOTE — Progress Notes (Signed)
  Subjective:    Patient ID: Terri Bridges, female    DOB: Nov 06, 1934, 77 y.o.   MRN: [id number]  HPI  77 year old white female with multiple medical problems including hypothyroidism hypertension and depression as well as history of recurrent sinus infections. Late last week developed URI symptoms. Awakened on Friday, February 8 with drainage from her right ear on her pillow. Complains of maxillary sinus pressure and congestion. Discolored nasal drainage. Course and congested. No documented fever but his been sweating. No chills.    Review of Systems     Objective:   Physical Exam patient has purulent drainage in right external ear canal. Right TM is chronically scarred but not read. Left TM is chronically scarred but not read. Pharynx is injected without exudate. She sounds nasally congested and has a congested cough. Chest is clear.dictation without rales or wheezing.        Assessment & Plan:  Sinusitis  Perforation right TM  Right otitis media   Hypertension-currently working to get it under better control  Plan: 1 g IM Rocephin. Avelox samples 400 mg daily for 6 days. Ciprofloxacin ophthalmic drops to use in right external ear canal 4 drops right ear 4 times daily for 5-7 days. Patient has return appointment next week for blood pressure followup and we can check erythema time.

## 2011-02-22 ENCOUNTER — Encounter: Payer: Self-pay | Admitting: Internal Medicine

## 2011-03-01 ENCOUNTER — Other Ambulatory Visit: Payer: Self-pay | Admitting: Internal Medicine

## 2011-03-06 ENCOUNTER — Ambulatory Visit (INDEPENDENT_AMBULATORY_CARE_PROVIDER_SITE_OTHER): Payer: Medicare Other | Admitting: Internal Medicine

## 2011-03-06 ENCOUNTER — Encounter: Payer: Self-pay | Admitting: Internal Medicine

## 2011-03-06 DIAGNOSIS — F411 Generalized anxiety disorder: Secondary | ICD-10-CM

## 2011-03-06 DIAGNOSIS — H8110 Benign paroxysmal vertigo, unspecified ear: Secondary | ICD-10-CM

## 2011-03-06 DIAGNOSIS — H7290 Unspecified perforation of tympanic membrane, unspecified ear: Secondary | ICD-10-CM | POA: Insufficient documentation

## 2011-03-06 DIAGNOSIS — I10 Essential (primary) hypertension: Secondary | ICD-10-CM

## 2011-03-06 DIAGNOSIS — F419 Anxiety disorder, unspecified: Secondary | ICD-10-CM | POA: Insufficient documentation

## 2011-03-06 NOTE — Patient Instructions (Addendum)
Patient is to take Xanax sparingly 0.25 mg twice daily for benign positional vertigo. Return in 4 weeks to recheck right ruptured tympanic membrane. Take blood pressure medications in the mornings on a daily basis.

## 2011-03-06 NOTE — Progress Notes (Signed)
  Subjective:    Patient ID: Franki Cabot, female    DOB: Feb 27, 1934, 77 y.o.   MRN: [id number]  HPI today to followup on ruptured right tympanic membrane. She took 6 days of Avelox samples and use Cortisporin Otic suspension. Perforation remains in right tympanic membrane. She says that she feels like the ear is roaring. Has had some gait instability. Positional vertigo symptoms. Noticed this at church yesterday with change in position from sitting to standing quickly. Has had vertigo in the remote past. Blood pressure initially elevated on arrival rechecked it was 130/80. She has not had any antihypertensive medication this morning.    Review of Systems     Objective:   Physical Exam HEENT exam: Fairly large perforation right tympanic membrane. No further purulent drainage noted. Ear canal is clean. Left TM chronically scarred. Pharynx clear. Neck supple. Chest clear.        Assessment & Plan:  Perforation right tympanic membrane  Hypertension  Benign positional vertigo  Plan: Patient needs to take antihypertensive medication every morning on a regular basis. Says she was afraid to go purchased new medication since I might change it. For now will not change antihypertensive medication. She is to return here in 4 weeks and his eardrum has not begun to heal, she will need ENT referral. 4 benign positional vertigo have prescribed generic Xanax 0.25 mg #60 one by mouth twice a day when necessary vertigo with 2 refills. She is on generic Zoloft for depression.

## 2011-03-13 NOTE — Progress Notes (Signed)
  Subjective:    Patient ID: Terri Bridges, female    DOB: 03-17-1934, 77 y.o.   MRN: [id number]  HPI For followup of hypertension. Blood pressure control has not been good recently. We discontinued ACE inhibitor because of increasing creatinine. Tried amlodipine but she insisted this made her feel awful and tired. History of anxiety depression. Is raising grandchild alone. Never feels well. Has recurrent sinusitis. History of GE reflux.    Review of Systems     Objective:   Physical Exam chest clear to auscultation, cardiac exam: Regular rate and rhythm; extremities trace edema        Assessment & Plan:  Hypertension-patient complains of not feeling well on amlodipine. Says it made her tired.  Try Cozaar 50 mg daily with followup in a couple of weeks.

## 2011-03-15 ENCOUNTER — Other Ambulatory Visit (INDEPENDENT_AMBULATORY_CARE_PROVIDER_SITE_OTHER): Payer: Medicare Other

## 2011-03-15 DIAGNOSIS — Z1211 Encounter for screening for malignant neoplasm of colon: Secondary | ICD-10-CM

## 2011-03-15 LAB — POC HEMOCCULT BLD/STL (HOME/3-CARD/SCREEN)
Card #3 Fecal Occult Blood, POC: NEGATIVE
Fecal Occult Blood, POC: NEGATIVE

## 2011-03-19 NOTE — Patient Instructions (Signed)
Change to Cozaar 50 mg daily. Followup in a couple of weeks.

## 2011-04-03 ENCOUNTER — Encounter: Payer: Self-pay | Admitting: Internal Medicine

## 2011-04-03 ENCOUNTER — Ambulatory Visit (INDEPENDENT_AMBULATORY_CARE_PROVIDER_SITE_OTHER): Payer: Medicare Other | Admitting: Internal Medicine

## 2011-04-03 VITALS — BP 138/66 | Temp 98.5°F | Wt 160.0 lb

## 2011-04-03 DIAGNOSIS — M810 Age-related osteoporosis without current pathological fracture: Secondary | ICD-10-CM

## 2011-04-03 DIAGNOSIS — I10 Essential (primary) hypertension: Secondary | ICD-10-CM

## 2011-04-03 DIAGNOSIS — H7290 Unspecified perforation of tympanic membrane, unspecified ear: Secondary | ICD-10-CM

## 2011-04-03 DIAGNOSIS — J329 Chronic sinusitis, unspecified: Secondary | ICD-10-CM

## 2011-04-03 DIAGNOSIS — K219 Gastro-esophageal reflux disease without esophagitis: Secondary | ICD-10-CM

## 2011-04-03 NOTE — Progress Notes (Signed)
  Subjective:    Patient ID: Terri Bridges, female    DOB: 22-Dec-1934, 77 y.o.   MRN: [id number]  HPI Patient in today to followup on perforated right TM. At last visit was seen with purulent drainage from right external ear canal from perforated TM. Was given Rocephin and placed on oral antibiotics. Also wants to know results of bone density study. Bone density study shows osteoporosis which is not surprising given the fact that she's been on multiple rounds of steroids over the years for recurrent sinusitis. She also has history of smoking. Vitamin D level checked in November was within normal limits. She is not taking calcium or vitamin D supplement. Have recommended Caltrate +2000 units vitamin D 3 over-the-counter daily. Patient says that she cannot hear well out of right ear.    Review of Systems     Objective:   Physical Exam perforation right TM; also appears to have perforation of left TM although she has no complaint of hearing loss in that ear. Chest clear; cardiac exam regular rate and rhythm; extremities without edema. Blood pressure rechecked left arm regular cuff 120/80        Assessment & Plan:  Perforation right TM which was acute and now is complaining of hearing loss  Probable chronic left TM perforation with no complaint of hearing loss  Hypertension-well-controlled on current regimen  Depression  GE reflux  COPD  Chronic sinusitis recurrent  Plan: Refer to ENT physician to look at right tympanic membrane and check out hearing loss complaint.  Otherwise patient will return here in December 2013 for physical exam. Last physical exam was 12/08/2010. Have given her samples of albuterol inhaler, and Nexium today.

## 2011-04-03 NOTE — Patient Instructions (Signed)
Take calcium supplement daily as well as vitamin D 3 2000 units daily. We will make appointment for you to see ENT physician regarding hearing loss in right ear and perforation of right eardrum. Return in December 2013 for physical exam.

## 2011-04-06 ENCOUNTER — Telehealth: Payer: Self-pay | Admitting: Internal Medicine

## 2011-04-06 NOTE — Telephone Encounter (Signed)
Advised patient of appt and she verbalized understanding.

## 2011-04-30 ENCOUNTER — Other Ambulatory Visit: Payer: Self-pay | Admitting: Internal Medicine

## 2011-06-03 ENCOUNTER — Other Ambulatory Visit: Payer: Self-pay | Admitting: Internal Medicine

## 2011-07-03 ENCOUNTER — Other Ambulatory Visit: Payer: Self-pay | Admitting: Internal Medicine

## 2011-07-05 ENCOUNTER — Other Ambulatory Visit: Payer: Self-pay | Admitting: Internal Medicine

## 2011-08-16 ENCOUNTER — Other Ambulatory Visit: Payer: Self-pay | Admitting: Otolaryngology

## 2011-08-16 DIAGNOSIS — H9209 Otalgia, unspecified ear: Secondary | ICD-10-CM

## 2011-08-16 DIAGNOSIS — H7290 Unspecified perforation of tympanic membrane, unspecified ear: Secondary | ICD-10-CM

## 2011-08-17 ENCOUNTER — Ambulatory Visit
Admission: RE | Admit: 2011-08-17 | Discharge: 2011-08-17 | Disposition: A | Discharge status: Home / Self Care | Payer: Medicare Other | Source: Ambulatory Visit | Attending: Otolaryngology | Admitting: Otolaryngology

## 2011-08-17 DIAGNOSIS — H7290 Unspecified perforation of tympanic membrane, unspecified ear: Secondary | ICD-10-CM

## 2011-08-17 DIAGNOSIS — H9209 Otalgia, unspecified ear: Secondary | ICD-10-CM

## 2011-09-01 ENCOUNTER — Other Ambulatory Visit: Payer: Self-pay

## 2011-09-01 ENCOUNTER — Other Ambulatory Visit: Payer: Self-pay | Admitting: Internal Medicine

## 2011-09-01 MED ORDER — LOSARTAN POTASSIUM 50 MG PO TABS: 50.0000 mg | ORAL_TABLET | Freq: Every day | ORAL | Status: DC

## 2011-09-07 ENCOUNTER — Other Ambulatory Visit: Payer: Self-pay | Admitting: Internal Medicine

## 2011-10-03 ENCOUNTER — Other Ambulatory Visit: Payer: Self-pay | Admitting: Internal Medicine

## 2011-10-20 ENCOUNTER — Other Ambulatory Visit: Payer: Self-pay | Admitting: Internal Medicine

## 2011-10-20 ENCOUNTER — Encounter: Payer: Self-pay | Admitting: Internal Medicine

## 2011-10-20 ENCOUNTER — Ambulatory Visit
Admission: RE | Admit: 2011-10-20 | Discharge: 2011-10-20 | Disposition: A | Discharge status: Home / Self Care | Payer: Medicare Other | Source: Ambulatory Visit | Attending: Internal Medicine | Admitting: Internal Medicine

## 2011-10-20 ENCOUNTER — Ambulatory Visit (INDEPENDENT_AMBULATORY_CARE_PROVIDER_SITE_OTHER): Payer: Medicare Other | Admitting: Internal Medicine

## 2011-10-20 VITALS — BP 150/84 | HR 78 | Temp 97.9°F | Ht 61.5 in | Wt 167.0 lb

## 2011-10-20 DIAGNOSIS — F3289 Other specified depressive episodes: Secondary | ICD-10-CM

## 2011-10-20 DIAGNOSIS — S0990XA Unspecified injury of head, initial encounter: Secondary | ICD-10-CM

## 2011-10-20 DIAGNOSIS — Z23 Encounter for immunization: Secondary | ICD-10-CM

## 2011-10-20 DIAGNOSIS — F329 Major depressive disorder, single episode, unspecified: Secondary | ICD-10-CM

## 2011-10-20 DIAGNOSIS — I10 Essential (primary) hypertension: Secondary | ICD-10-CM

## 2011-10-20 DIAGNOSIS — R52 Pain, unspecified: Secondary | ICD-10-CM

## 2011-10-20 DIAGNOSIS — W19XXXA Unspecified fall, initial encounter: Secondary | ICD-10-CM

## 2011-10-20 DIAGNOSIS — J329 Chronic sinusitis, unspecified: Secondary | ICD-10-CM

## 2011-10-20 DIAGNOSIS — H9319 Tinnitus, unspecified ear: Secondary | ICD-10-CM

## 2011-10-20 DIAGNOSIS — R0781 Pleurodynia: Secondary | ICD-10-CM

## 2011-10-20 DIAGNOSIS — R51 Headache: Secondary | ICD-10-CM

## 2011-10-20 DIAGNOSIS — R079 Chest pain, unspecified: Secondary | ICD-10-CM

## 2011-10-20 MED ORDER — CEFTRIAXONE SODIUM 1 G IJ SOLR
1.0000 g | Freq: Once | INTRAMUSCULAR | Status: AC
  Administered 2011-10-20: 1 g via INTRAMUSCULAR

## 2011-10-20 NOTE — Patient Instructions (Addendum)
You have been given 1 g IM Rocephin today. Take clindamycin 3 times daily for 10 days. Use Advair inhaler and Nasonex nasal spray. Call in 2 weeks with progress report. You have Vicodin to take for rib cage pain.

## 2011-10-20 NOTE — Progress Notes (Signed)
  Subjective:    Patient ID: Terri Bridges, female    DOB: 1934-02-15, 77 y.o.   MRN: [id number]  HPI 77 year old White female fell at end of September and struck left side of head. Has not felt right since. Has ringing in ears. Saw Dr. Jearld Fenton end of September (ENT) who cleaned out right ear. Gave her antibiotics for several weeks. Says right ear and right post auricular area  was bruised and discolored. Had CT of temporal bone area on the right. There was suspicion for possible early cholesteatoma. Was told she had a persistent perforation and right TM.  Last week she was in her bedroom trying to open a cabinet, lost her balance and fell striking her left lateral rib cage. Does not think she had loss of consciousness with these falls. Having a lot of pain in left lateral rib cage area. Feels that she has persistent sinus congestion despite taking antibiotics for several weeks.  Says she's not had any issues with her memory. Says she's had some vague headaches.    Review of Systems     Objective:   Physical Exam perforation right TM. Chronic scarring left TM with some fullness. Pharynx is clear. PERRLA. Funduscopic exam is benign with this being sharp and flat bilaterally. Chest is clear. Cardiac exam regular rate and rhythm normal S1 and S2. No bruising noted around right postauricular area. Sounds nasally congested. Seems a bit drowsy today and fatigue. Extreme pain left lateral rib cage area to palpation along lower left lateral rib in axillary line. Neck is supple. Alert and oriented x3. Affect is flat and depressed but she seems fatigued and in pain. Sent for rib detail films.        Assessment & Plan:  Chronic sinusitis  Chronic perforation right TM  Possible right cholesteatoma  Left rib cage pain secondary to fall- no evidence of rib fracture  Tinnitus- no relief with lipoflavinoid  Plan: Vicodin/5/500 (#60) one po q 6 hours prn pain. Rocephin one gram IM.

## 2011-11-02 ENCOUNTER — Other Ambulatory Visit: Payer: Self-pay | Admitting: Internal Medicine

## 2011-11-13 ENCOUNTER — Ambulatory Visit
Admission: RE | Admit: 2011-11-13 | Discharge: 2011-11-13 | Disposition: A | Discharge status: Home / Self Care | Payer: Medicare Other | Source: Ambulatory Visit | Attending: Internal Medicine | Admitting: Internal Medicine

## 2011-11-13 ENCOUNTER — Encounter: Payer: Self-pay | Admitting: Internal Medicine

## 2011-11-13 ENCOUNTER — Ambulatory Visit (INDEPENDENT_AMBULATORY_CARE_PROVIDER_SITE_OTHER): Payer: Medicare Other | Admitting: Internal Medicine

## 2011-11-13 VITALS — BP 152/88 | HR 88 | Temp 98.2°F | Ht 61.5 in | Wt 167.0 lb

## 2011-11-13 DIAGNOSIS — S199XXA Unspecified injury of neck, initial encounter: Secondary | ICD-10-CM

## 2011-11-13 DIAGNOSIS — T148XXA Other injury of unspecified body region, initial encounter: Secondary | ICD-10-CM

## 2011-11-13 DIAGNOSIS — S0993XA Unspecified injury of face, initial encounter: Secondary | ICD-10-CM

## 2011-11-13 NOTE — Progress Notes (Signed)
  Subjective:    Patient ID: Terri Bridges, female    DOB: 12-15-1934, 77 y.o.   MRN: [id number]  HPI  77 year old white female with history of hypertension, hypothyroidism, chronic sinusitis and allergic rhinitis, depression in today without an appointment saying that she took a fall at home on Friday, November 1. Somehow she fell over an outside banister coming down handicap ramp built for her granddaughter. Does not sound like she had loss of consciousness. Her son came to help her up. She did not seek medical attention that day. She is not complaining of headache. No visual disturbance. Has noticed a lump on top forehead. She thinks her thinking is not disturbed and does not appear to be confused.       Review of Systems     Objective:   Physical Exam alert and oriented x3. Considerable bruising extending from left postauricular area down neck into occipital area. Large hematoma top of the head. PERRLA. Funduscopic exam shows cataract left eye. TMs are chronically scarred. Neck is supple. Chest clear to auscultation. Cardiac exam regular rate and rhythm. Skin is pale warm and dry. Cranial nerves II through XII are grossly intact. Deep tendon reflexes 2+ and symmetrical. Moves all 4 extremities. Cerebellar finger to nose testing is within normal limits.        Assessment & Plan:  Head trauma secondary to fall  Hematoma of the scalp  Contusions left postauricular and occipital areas  Plan: CT of the brain without contrast to rule out subdural hematoma or skull fracture. Also C-spine films.  Addendum: C-spine films are negative. CT of the brain shows no subdural hematoma or skull fracture.

## 2011-11-14 NOTE — Patient Instructions (Addendum)
Take Tylenol or Vicodin as needed for pain. Avoid NSAIDS. Apply ice to hematoma of the scalp. Call if not better in 7-10 days or sooner if worse.

## 2011-12-11 ENCOUNTER — Other Ambulatory Visit: Payer: Medicare Other | Admitting: Internal Medicine

## 2011-12-12 ENCOUNTER — Encounter: Payer: Medicare Other | Admitting: Internal Medicine

## 2011-12-25 ENCOUNTER — Other Ambulatory Visit: Payer: Medicare Other | Admitting: Internal Medicine

## 2011-12-25 DIAGNOSIS — E039 Hypothyroidism, unspecified: Secondary | ICD-10-CM

## 2011-12-25 DIAGNOSIS — I10 Essential (primary) hypertension: Secondary | ICD-10-CM

## 2011-12-25 DIAGNOSIS — M8580 Other specified disorders of bone density and structure, unspecified site: Secondary | ICD-10-CM

## 2011-12-25 LAB — CBC WITH DIFFERENTIAL/PLATELET
HCT: 38.4 % (ref 36.0–46.0)
Hemoglobin: 12.6 g/dL (ref 12.0–15.0)
Lymphocytes Relative: 20 % (ref 12–46)
Lymphs Abs: 1.1 10*3/uL (ref 0.7–4.0)
Monocytes Absolute: 0.6 10*3/uL (ref 0.1–1.0)
Monocytes Relative: 10 % (ref 3–12)
Neutro Abs: 3.9 10*3/uL (ref 1.7–7.7)
Neutrophils Relative %: 66 % (ref 43–77)
RBC: 4.9 MIL/uL (ref 3.87–5.11)

## 2011-12-25 LAB — LIPID PANEL
LDL Cholesterol: 135 mg/dL — ABNORMAL HIGH (ref 0–99)
Triglycerides: 56 mg/dL (ref <150)

## 2011-12-25 LAB — COMPREHENSIVE METABOLIC PANEL
Albumin: 4.3 g/dL (ref 3.5–5.2)
Alkaline Phosphatase: 83 U/L (ref 39–117)
BUN: 17 mg/dL (ref 6–23)
CO2: 26 mEq/L (ref 19–32)
Calcium: 9.2 mg/dL (ref 8.4–10.5)
Chloride: 107 mEq/L (ref 96–112)
Glucose, Bld: 93 mg/dL (ref 70–99)
Potassium: 4.2 mEq/L (ref 3.5–5.3)
Sodium: 145 mEq/L (ref 135–145)
Total Protein: 6.5 g/dL (ref 6.0–8.3)

## 2011-12-28 ENCOUNTER — Encounter: Payer: Self-pay | Admitting: Internal Medicine

## 2011-12-28 ENCOUNTER — Ambulatory Visit (INDEPENDENT_AMBULATORY_CARE_PROVIDER_SITE_OTHER): Payer: Medicare Other | Admitting: Internal Medicine

## 2011-12-28 VITALS — BP 160/84 | HR 80 | Temp 98.3°F | Ht 60.0 in | Wt 172.0 lb

## 2011-12-28 DIAGNOSIS — K219 Gastro-esophageal reflux disease without esophagitis: Secondary | ICD-10-CM

## 2011-12-28 DIAGNOSIS — F411 Generalized anxiety disorder: Secondary | ICD-10-CM

## 2011-12-28 DIAGNOSIS — R51 Headache: Secondary | ICD-10-CM

## 2011-12-28 DIAGNOSIS — H9193 Unspecified hearing loss, bilateral: Secondary | ICD-10-CM

## 2011-12-28 DIAGNOSIS — J309 Allergic rhinitis, unspecified: Secondary | ICD-10-CM

## 2011-12-28 DIAGNOSIS — I10 Essential (primary) hypertension: Secondary | ICD-10-CM

## 2011-12-28 DIAGNOSIS — F419 Anxiety disorder, unspecified: Secondary | ICD-10-CM

## 2011-12-28 DIAGNOSIS — Z0000 Encounter for general adult medical examination without abnormal findings: Secondary | ICD-10-CM

## 2011-12-28 DIAGNOSIS — M810 Age-related osteoporosis without current pathological fracture: Secondary | ICD-10-CM

## 2011-12-28 DIAGNOSIS — H9190 Unspecified hearing loss, unspecified ear: Secondary | ICD-10-CM

## 2011-12-28 DIAGNOSIS — E039 Hypothyroidism, unspecified: Secondary | ICD-10-CM

## 2011-12-28 DIAGNOSIS — F329 Major depressive disorder, single episode, unspecified: Secondary | ICD-10-CM

## 2011-12-28 DIAGNOSIS — J449 Chronic obstructive pulmonary disease, unspecified: Secondary | ICD-10-CM

## 2011-12-28 MED ORDER — LOSARTAN POTASSIUM 50 MG PO TABS: 100.0000 mg | ORAL_TABLET | Freq: Every day | ORAL | Status: DC

## 2011-12-28 MED ORDER — IPRATROPIUM BROMIDE 0.02 % IN SOLN: 500.0000 ug | Freq: Three times a day (TID) | RESPIRATORY_TRACT | Status: DC

## 2011-12-28 MED ORDER — SERTRALINE HCL 100 MG PO TABS: 100.0000 mg | ORAL_TABLET | Freq: Every day | ORAL | Status: DC

## 2011-12-28 MED ORDER — LEVOTHYROXINE SODIUM 150 MCG PO TABS: 150.0000 ug | ORAL_TABLET | Freq: Every day | ORAL | Status: DC

## 2011-12-28 MED ORDER — ALPRAZOLAM 0.25 MG PO TABS: 0.5000 mg | ORAL_TABLET | Freq: Two times a day (BID) | ORAL | Status: DC | PRN

## 2011-12-28 MED ORDER — LOSARTAN POTASSIUM 100 MG PO TABS: 100.0000 mg | ORAL_TABLET | Freq: Every day | ORAL | Status: DC

## 2011-12-28 NOTE — Patient Instructions (Addendum)
Change omeprazole to Protonix 40 mg daily. Increase Cozaar to 100 mg daily. Take xanax at night to sleep.

## 2011-12-28 NOTE — Progress Notes (Signed)
Subjective:    Patient ID: Terri Bridges, female    DOB: May 18, 1934, 77 y.o.   MRN: [id number]  HPI 77 year old for  health maintenance and evaluation of medical problems. GE reflux worse on omeprazole. Right ear has ringing and continues to see ENT. Had bad fall in November. Feels fatigued. Trying to clean houses for money. Chronically deperssed for years. Hx COPD and recurrent sinusitis. History of elevated serum creatinine on ACE inhibitor. Patient is raising her teenage granddaughter of line. Granddaughter has a history of leg deformity. Has had multiple surgeries. Is unable to find a job. Patient struggles financially. She has several children but they don't seem to help very much. Some of these children have addiction issues. She had a fall in November. She fell over an outside banister coming down a handicap ramp built for her granddaughter. Did not have loss of consciousness. Had hematoma old forehead. CT was negative. Continues to complain of headache.  History of hypothyroidism and hypertension. History of osteopenia. History of allergic rhinitis.  History of stress urinary incontinence. No known drug allergies.  History of bilateral sphenoethmoidectomies, bilateral nasal polypectomies, bilateral maxillary antrotomy 07/02/1989 by Dr. Lyman Bishop. Right intranasal ethmoidectomy, left drainage of periorbital abscess right 03/31/1991. Last ethmoidectomy, left sphenoidectomy, transnasal frontal sinusotomy 04/07/1991. Arthroscopic surgery right knee February 2004. Bilateral chronic tubes placed February 2004. Left cataract extraction January 2013.  Social history: Resides with granddaughter. 6 adult children. Is divorced. 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  Patient had colonoscopy by Dr. Randa Evens 12/24/2006. Had upper GI endoscopy at the same time showing a hiatal hernia.  History of  asthma and allergic rhinitis treated by Dr. Lucie Leather.  History of exercise tolerance test to evaluate chest pain but Dr. Deborah Chalk November 1990. Study was clinically negative electrocardiographically negative. Dr. Daleen Squibb saw her in 2005 for palpitations. He placed her on Cardizem with improvement in blood pressure and palpitations. She had a cardiac catheterization that showed no significant coronary artery disease. Ejection fraction was 65% with a hyperdynamic ventricle.  Patient had osteoporosis screening with bone density 02/13/2011 at [facility]. T score in the left femoral neck -2.5. T score at L4 -2.5. Because of GE reflux it is not likely she would tolerate Fosamax . She was on Actonel for while in 2002 but was discontinued.   Calcium and vitamin D recommended. Has difficulty affording medications. We give her many samples.  Former heavy smoker having smoked one or 2 packs of cigarettes daily for some 10 years. Quit smoking in 1993.  Family history: Father died at age 32 an MI, mother died at age 6 of an MI.   Review of Systems  Constitutional: Positive for fatigue.  HENT: Positive for hearing loss and congestion.   Eyes: Negative.   Respiratory:       History of asthma and allergic rhinitis with some occasional bronchospasm  Cardiovascular:       History of chest pain in the remote past with negative cardiac catheterization 20th  Endocrine: Negative.   Genitourinary:       Stress urinary incontinence  Neurological:       Headaches  Psychiatric/Behavioral:       Anxiety depression       Objective:   Physical Exam  Vitals reviewed. Constitutional: She appears well-developed and well-nourished. No distress.  HENT:  Head: Normocephalic and atraumatic.  Right Ear: External ear normal.  Left Ear: External ear normal.  Mouth/Throat: Oropharynx is clear and moist. No oropharyngeal exudate.  Eyes: Conjunctivae and EOM are normal. Pupils are equal, round, and reactive to light. Right eye  exhibits no discharge. Left eye exhibits no discharge. No scleral icterus.  Neck: No JVD present. No thyromegaly present.  Cardiovascular: Regular rhythm, normal heart sounds and intact distal pulses.   No murmur heard. Pulmonary/Chest: Effort normal. No respiratory distress. She has no wheezes. She has no rales. She exhibits no tenderness.  Breasts normal female  Abdominal: Soft. Bowel sounds are normal. She exhibits no distension and no mass. There is no tenderness. There is no rebound and no guarding.  Musculoskeletal: Normal range of motion. She exhibits no edema.  Lymphadenopathy:    She has no cervical adenopathy.  Neurological: She has normal reflexes. No cranial nerve deficit. Coordination normal.  Skin: Skin is warm and dry. No rash noted. She is not diaphoretic.  Psychiatric: Her behavior is normal. Judgment and thought content normal.  Flat affect          Assessment & Plan:  Hypertension-need to improve control. Increase Cozaar to 100 mg daily.  Hypothyroidism  History of stress urinary incontinence has taken it for pain in the past-on thyroid replacement therapy-recommend calcium and vitamin D  Anxiety depression-stable but chronic despite Zoloft  Osteoporosis  GE reflux-patient says Prilosec not working. Change to Protonix 40 mg daily.  Asthma and allergic rhinitis-has rescue inhaler  Headache related to head trauma with fall November 2013  Plan: Reevaluate blood pressure in several weeks.    . Subjective:   Patient presents for Medicare Annual/Subsequent preventive examination.   Review Past Medical/Family/Social:  See Epic   Risk Factors  Current exercise habits: sedentary Dietary issues discussed: low fat low carb  Cardiac risk factors:HTN, hyperlipidemia  Depression Screen  (Note: if answer to either of the following is "Yes", a more complete depression screening is indicated)   Over the past two weeks, have you felt down, depressed or  hopeless? yes Over the past two weeks, have you felt little interest or pleasure in doing things? yes Have you lost interest or pleasure in daily life? No Do you often feel hopeless? No Do you cry easily over simple problems? No   Activities of Daily Living  In your present state of health, do you have any difficulty performing the following activities?:   Driving? No  Managing money? No  Feeding yourself? No  Getting from bed to chair? No  Climbing a flight of stairs? No  Preparing food and eating?: No  Bathing or showering? No  Getting dressed: No  Getting to the toilet? No  Using the toilet:No  Moving around from place to place: No  In the past year have you fallen or had a near fall?:No  Are you sexually active? No  Do you have more than one partner? No   Hearing Difficulties: No  Do you often ask people to speak up or repeat themselves? No  Do you experience ringing or noises in your ears? No  Do you have difficulty understanding soft or whispered voices? No  Do you feel that you have a problem with memory? No Do you often misplace items? No    Home Safety:  Do you have a smoke alarm at your residence? Yes Do you have grab bars in the bathroom? no Do you have throw rugs in your house?yes   Cognitive Testing  Alert? Yes Normal Appearance?Yes  Oriented to  person? Yes Place? Yes  Time? Yes  Recall of three objects? Yes  Can perform simple calculations? Yes  Displays appropriate judgment?Yes  Can read the correct time from a watch face?Yes   List the Names of Other Physician/Practitioners you currently use:  See referral list for the physicians patient is currently seeing. Dr. Dagoberto Ligas    Review of Systems:   Objective:     General appearance: Appears stated age and mildly obese  Head: Normocephalic, without obvious abnormality, atraumatic  Eyes: conj clear, EOMi PEERLA  Ears: normal TM's and external ear canals both ears  Nose: Nares normal. Septum  midline. Mucosa normal. No drainage or sinus tenderness.  Throat: lips, mucosa, and tongue normal; teeth and gums normal  Neck: no adenopathy, no carotid bruit, no JVD, supple, symmetrical, trachea midline and thyroid not enlarged, symmetric, no tenderness/mass/nodules  No CVA tenderness.  Lungs: clear to auscultation bilaterally  Breasts: normal appearance, no masses or tenderness Heart: regular rate and rhythm, S1, S2 normal, no murmur, click, rub or gallop  Abdomen: soft, non-tender; bowel sounds normal; no masses, no organomegaly  Musculoskeletal: ROM normal in all joints, no crepitus, no deformity, Normal muscle strengthen. Back  is symmetric, no curvature. Skin: Skin color, texture, turgor normal. No rashes or lesions  Lymph nodes: Cervical, supraclavicular, and axillary nodes normal.  Neurologic: CN 2 -12 Normal, Normal symmetric reflexes. Normal coordination and gait  Psych: Alert & Oriented x 3, Mood appear stable.    Assessment:    Annual wellness medicare exam   Plan:    During the course of the visit the patient was educated and counseled about appropriate screening and preventive services including: mammogram       Patient Instructions (the written plan) was given to the patient.  Medicare Attestation  I have personally reviewed:  The patient's medical and social history  Their use of alcohol, tobacco or illicit drugs  Their current medications and supplements  The patient's functional ability including ADLs,fall risks, home safety risks, cognitive, and hearing and visual impairment  Diet and physical activities  Evidence for depression or mood disorders  The patient's weight, height, BMI, and visual acuity have been recorded in the chart. I have made referrals, counseling, and provided education to the patient based on review of the above and I have provided the patient with a written personalized care plan for preventive services.

## 2011-12-29 ENCOUNTER — Other Ambulatory Visit: Payer: Self-pay | Admitting: Internal Medicine

## 2012-02-08 ENCOUNTER — Other Ambulatory Visit: Payer: Self-pay | Admitting: Internal Medicine

## 2012-02-12 ENCOUNTER — Ambulatory Visit (INDEPENDENT_AMBULATORY_CARE_PROVIDER_SITE_OTHER): Payer: Medicare Other | Admitting: Internal Medicine

## 2012-02-12 ENCOUNTER — Encounter: Payer: Self-pay | Admitting: Internal Medicine

## 2012-02-12 VITALS — BP 134/70 | HR 76 | Temp 99.0°F | Wt 176.0 lb

## 2012-02-12 DIAGNOSIS — Z9181 History of falling: Secondary | ICD-10-CM

## 2012-02-12 DIAGNOSIS — M170 Bilateral primary osteoarthritis of knee: Secondary | ICD-10-CM

## 2012-02-12 DIAGNOSIS — M79604 Pain in right leg: Secondary | ICD-10-CM

## 2012-02-12 DIAGNOSIS — M79609 Pain in unspecified limb: Secondary | ICD-10-CM

## 2012-02-12 DIAGNOSIS — M79605 Pain in left leg: Secondary | ICD-10-CM | POA: Insufficient documentation

## 2012-02-12 DIAGNOSIS — I10 Essential (primary) hypertension: Secondary | ICD-10-CM

## 2012-02-12 DIAGNOSIS — M1710 Unilateral primary osteoarthritis, unspecified knee: Secondary | ICD-10-CM

## 2012-02-12 DIAGNOSIS — R51 Headache: Secondary | ICD-10-CM

## 2012-02-12 LAB — BASIC METABOLIC PANEL
Glucose, Bld: 82 mg/dL (ref 70–99)
Potassium: 4.3 mEq/L (ref 3.5–5.3)
Sodium: 142 mEq/L (ref 135–145)

## 2012-02-12 NOTE — Patient Instructions (Addendum)
Chronic leg pain at night to be evaluated with ABIs. Start Elavil 10 mg at bedtime for chronic headache. For chronic sinusitis Augmentin 500 mg 3 times daily for 10 days. May need to have ENT check right ear. Consider bilateral knee replacement as suggested by orthopedist. Do not take Xanax at night since starting Elavil. Return in 6 months for office visit and TSH.  Patient scheduled for ABI's on 02/19/2012 at 2:00pm at [facility]

## 2012-02-12 NOTE — Progress Notes (Signed)
  Subjective:    Patient ID: Terri Bridges, female    DOB: 11-05-1934, 78 y.o.   MRN: [id number]  HPI Multiple complaints. Never feels well.  Right occipital head pain daily and neck pain. Not continuous pain. Had CT of brain November showing old lacunar infarction and chronic sinusitis. Legs hurt at night. 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 Former smoker. Worried about peripheral vascular disease with leg pain and history of smoking. Has been told needs bilateral knee replacement which she is putting off. History of hypertension. Sister with scleroderma and arthritis.  At last visit, Cozaar was increased from 50 to 100 mg daily. B-met drawn today. Has fallen again and has contusions of both legs. Says she loses her balance. Has a cane at home but does not use. Hx hypothyroidism and TSH checked in December at time of PE.   FHX: Oldest sister has arthritis age 6. One sister with scleroderma age 68. One sister disappeared 40 years ago. Mother died with MI as did father. No brothers.  Quit smoking 1993. Formerly heavy smoker. Sees Dr. Dagoberto Ligas for opthalmology. Bilateral cataract extraction 2 years ago. Having trouble seeing well out of lefty eye and will see Dr. Dagoberto Ligas soon History of chronic depression and dysthymia.  Raising granddaughter who had leg length discrepancy resulting in amputation and prosthesis. Granddaughter cannot find a job and is age 39.    Review of Systems     Objective:   Physical Exam neck is supple without adenopathy JVD or thyromegaly. Chest clear to auscultation without rales. Cardiac exam regular rate and rhythm normal S1 and S2. Extremities without edema. Patient complaining of pain left popliteal fossa. Bilateral lower leg contusion secondary to fall. Is alert and oriented x3. Skin is dry. Affect is flat. Somewhat tearful in office today.        Assessment & Plan:    Chronic headache. Start Elavil 10 mg hs with refills. Do not take Xanax at bedtime. HTN- continue Cozaar 100 mg daily. Basic metabolic panel drawn today on Cozaar. Frequent falls- needs to use cane Bilateral leg pain- Have vascular eval done Depression- on Zoloft Hypothyroidism- stable on Synthroid 0.15 mg daily. TSH check December 2013  Chronic sinusitis-treat with Augmentin 500 mg 3 times daily for 10 days. May need to have right ear checked by ENT once again Return for six-month recheck July 2014 with TSH only.  25 minutes spent with patient.

## 2012-02-12 NOTE — Addendum Note (Signed)
Addended by: Judy Pimple on: 02/12/2012 03:22 PM   Modules accepted: Orders

## 2012-02-13 NOTE — Addendum Note (Signed)
Addended by: Judy Pimple on: 02/13/2012 01:07 PM   Modules accepted: Orders

## 2012-02-19 ENCOUNTER — Encounter (INDEPENDENT_AMBULATORY_CARE_PROVIDER_SITE_OTHER): Payer: Medicare Other | Admitting: *Deleted

## 2012-02-19 DIAGNOSIS — M79609 Pain in unspecified limb: Secondary | ICD-10-CM

## 2012-03-24 DIAGNOSIS — M810 Age-related osteoporosis without current pathological fracture: Secondary | ICD-10-CM | POA: Insufficient documentation

## 2012-04-06 ENCOUNTER — Other Ambulatory Visit: Payer: Self-pay | Admitting: Internal Medicine

## 2012-04-07 NOTE — Telephone Encounter (Signed)
Why does pt need refill? 

## 2012-04-08 NOTE — Telephone Encounter (Signed)
See request- does she need OV?

## 2012-04-09 NOTE — Telephone Encounter (Signed)
Called patient to see why she needed Augmentin refilled. Stated sinusitis back again. Dr. Lenord Fellers wanting to know if she got better from first round of antibiotics. Phone call then got disconnected

## 2012-04-11 ENCOUNTER — Other Ambulatory Visit: Payer: Self-pay | Admitting: Internal Medicine

## 2012-05-22 ENCOUNTER — Other Ambulatory Visit: Payer: Self-pay | Admitting: Internal Medicine

## 2012-07-07 ENCOUNTER — Other Ambulatory Visit: Payer: Self-pay | Admitting: Internal Medicine

## 2012-07-15 ENCOUNTER — Other Ambulatory Visit: Payer: Self-pay | Admitting: Internal Medicine

## 2012-07-24 ENCOUNTER — Telehealth: Payer: Self-pay

## 2012-07-24 NOTE — Telephone Encounter (Signed)
See Friday unless symptoms warrant sooner evaluation.

## 2012-07-24 NOTE — Telephone Encounter (Signed)
Patient informed. 

## 2012-07-24 NOTE — Telephone Encounter (Signed)
Patient fell at home yesterday. Went down on her knees and hit her head on the floor. Has a large goose egg knot. Using ice, Tylenol. Worried about her medications. Also states she just buried her son this past Saturday. Ha an appointment Friday 07/26/2012.

## 2012-07-26 ENCOUNTER — Encounter: Payer: Self-pay | Admitting: Internal Medicine

## 2012-07-26 ENCOUNTER — Ambulatory Visit
Admission: RE | Admit: 2012-07-26 | Discharge: 2012-07-26 | Disposition: A | Discharge status: Home / Self Care | Payer: Medicare Other | Source: Ambulatory Visit | Attending: Internal Medicine | Admitting: Internal Medicine

## 2012-07-26 ENCOUNTER — Ambulatory Visit (INDEPENDENT_AMBULATORY_CARE_PROVIDER_SITE_OTHER): Payer: Medicare Other | Admitting: Internal Medicine

## 2012-07-26 VITALS — BP 148/86 | HR 80 | Temp 98.5°F | Wt 171.0 lb

## 2012-07-26 DIAGNOSIS — Y92009 Unspecified place in unspecified non-institutional (private) residence as the place of occurrence of the external cause: Secondary | ICD-10-CM

## 2012-07-26 DIAGNOSIS — S8000XA Contusion of unspecified knee, initial encounter: Secondary | ICD-10-CM

## 2012-07-26 DIAGNOSIS — E039 Hypothyroidism, unspecified: Secondary | ICD-10-CM

## 2012-07-26 DIAGNOSIS — S0510XA Contusion of eyeball and orbital tissues, unspecified eye, initial encounter: Secondary | ICD-10-CM

## 2012-07-26 DIAGNOSIS — W19XXXA Unspecified fall, initial encounter: Secondary | ICD-10-CM

## 2012-07-26 DIAGNOSIS — F4321 Adjustment disorder with depressed mood: Secondary | ICD-10-CM

## 2012-07-26 DIAGNOSIS — F329 Major depressive disorder, single episode, unspecified: Secondary | ICD-10-CM

## 2012-07-26 DIAGNOSIS — F411 Generalized anxiety disorder: Secondary | ICD-10-CM

## 2012-07-26 DIAGNOSIS — S0011XA Contusion of right eyelid and periocular area, initial encounter: Secondary | ICD-10-CM

## 2012-07-26 DIAGNOSIS — S8001XA Contusion of right knee, initial encounter: Secondary | ICD-10-CM

## 2012-07-26 LAB — TSH: TSH: 0.075 u[IU]/mL — ABNORMAL LOW (ref 0.350–4.500)

## 2012-07-26 NOTE — Progress Notes (Signed)
  Subjective:    Patient ID: Terri Bridges, female    DOB: [birth year], 78 y.o.   MRN: [id number]  HPI  She found her son sitting on the truck bed outside unresponsive . [facility] was subsequently [facility]. Doctors felt he had a cardiac arrest. He apparently had an anoxic brain damage and passed away. She is grief stricken. On Tuesday, July 15 she had a fall at home. She was walking across a room in her home and suddenly failed. Says she felt a little unsteady at the time. She's been sleeping fair but not through the night. She did not black out. She has a right black eye and a bruised right knee. She has a little bit of headache on the right side. Her granddaughter is with her today. Patient has not been back to work yet because she's too distraught.    Review of Systems     Objective:   Physical Exam She is alert and oriented x3. She's crying in the office today with grief over losing her son. Moves all 4 extremities. Has significant contusions right knee. Is able to ambulate. Disc are sharp and flat bilaterally. No facial weakness. Significant right periorbital hematoma.       Assessment & Plan:  Right. Orbital hematoma secondary to fall at home   Contusion right knee secondary to fall at home  Grief reaction  Anxiety  Depression  Plan: CT of the brain without contrast  45 minutes spent with patient

## 2012-07-30 NOTE — Progress Notes (Signed)
Informed.

## 2012-08-08 ENCOUNTER — Other Ambulatory Visit: Payer: Self-pay | Admitting: Internal Medicine

## 2012-09-12 ENCOUNTER — Other Ambulatory Visit: Payer: Self-pay | Admitting: Internal Medicine

## 2012-09-29 ENCOUNTER — Encounter (HOSPITAL_COMMUNITY): Payer: Self-pay | Admitting: Emergency Medicine

## 2012-09-29 ENCOUNTER — Emergency Department (HOSPITAL_COMMUNITY): Payer: Medicare Other

## 2012-09-29 ENCOUNTER — Emergency Department (HOSPITAL_COMMUNITY)
Admission: EM | Admit: 2012-09-29 | Discharge: 2012-09-29 | Disposition: A | Discharge status: Home / Self Care | Payer: Medicare Other | Attending: Emergency Medicine | Admitting: Emergency Medicine

## 2012-09-29 DIAGNOSIS — Z8739 Personal history of other diseases of the musculoskeletal system and connective tissue: Secondary | ICD-10-CM | POA: Insufficient documentation

## 2012-09-29 DIAGNOSIS — Z79899 Other long term (current) drug therapy: Secondary | ICD-10-CM | POA: Insufficient documentation

## 2012-09-29 DIAGNOSIS — F329 Major depressive disorder, single episode, unspecified: Secondary | ICD-10-CM | POA: Insufficient documentation

## 2012-09-29 DIAGNOSIS — K219 Gastro-esophageal reflux disease without esophagitis: Secondary | ICD-10-CM | POA: Insufficient documentation

## 2012-09-29 DIAGNOSIS — S62639A Displaced fracture of distal phalanx of unspecified finger, initial encounter for closed fracture: Secondary | ICD-10-CM | POA: Insufficient documentation

## 2012-09-29 DIAGNOSIS — Y9229 Other specified public building as the place of occurrence of the external cause: Secondary | ICD-10-CM | POA: Insufficient documentation

## 2012-09-29 DIAGNOSIS — E039 Hypothyroidism, unspecified: Secondary | ICD-10-CM | POA: Insufficient documentation

## 2012-09-29 DIAGNOSIS — F3289 Other specified depressive episodes: Secondary | ICD-10-CM | POA: Insufficient documentation

## 2012-09-29 DIAGNOSIS — I10 Essential (primary) hypertension: Secondary | ICD-10-CM | POA: Insufficient documentation

## 2012-09-29 DIAGNOSIS — W010XXA Fall on same level from slipping, tripping and stumbling without subsequent striking against object, initial encounter: Secondary | ICD-10-CM | POA: Insufficient documentation

## 2012-09-29 DIAGNOSIS — Z87891 Personal history of nicotine dependence: Secondary | ICD-10-CM | POA: Insufficient documentation

## 2012-09-29 DIAGNOSIS — F411 Generalized anxiety disorder: Secondary | ICD-10-CM | POA: Insufficient documentation

## 2012-09-29 DIAGNOSIS — Y9389 Activity, other specified: Secondary | ICD-10-CM | POA: Insufficient documentation

## 2012-09-29 MED ORDER — BUPIVACAINE HCL 0.5 % IJ SOLN
50.0000 mL | Freq: Once | INTRAMUSCULAR | Status: DC
  Filled 2012-09-29: qty 50

## 2012-09-29 MED ORDER — BUPIVACAINE HCL (PF) 0.5 % IJ SOLN: 30.0000 mL | Freq: Once | INTRAMUSCULAR | Status: DC

## 2012-09-29 MED ORDER — HYDROCODONE-ACETAMINOPHEN 5-325 MG PO TABS: 1.0000 | ORAL_TABLET | Freq: Four times a day (QID) | ORAL | Status: DC | PRN

## 2012-09-29 MED ORDER — IBUPROFEN 600 MG PO TABS: 600.0000 mg | ORAL_TABLET | Freq: Four times a day (QID) | ORAL | Status: DC | PRN

## 2012-09-29 NOTE — ED Notes (Signed)
Pt fell at [employer] and has obvious deformity to right pinky finger

## 2012-09-29 NOTE — ED Provider Notes (Signed)
CSN: [id number]     Arrival date & time 09/29/12  1116 History   First MD Initiated Contact with Patient 09/29/12 1134     Chief Complaint  Patient presents with  . Finger Injury   (Consider location/radiation/quality/duration/timing/severity/associated sxs/prior Treatment) HPI Terri Bridges is a 78 y.o. female presents to emergency department with complaint of right fifth finger deformity. Patient states she was at church and tripped over something on the floor and fell down catching herself with her right hand. States what looked at her hand her right fifth finger was pointing in the wrong direction. Patient denies any other injuries. She states that she did not fall but to the ground or hit her head. She states that she did have her left knee on the ground but denies any pain. Patient is ambulatory. In fact patient was able to sit through the rest of the service at church and had to saying in the choir.  Past Medical History  Diagnosis Date  . Hypertension   . Allergy   . Thyroid disease     hypothyroidism  . SUI (stress urinary incontinence, female)   . Anxiety   . Depression   . PVC (premature ventricular contraction)   . GERD (gastroesophageal reflux disease)   . Osteopenia    Past Surgical History  Procedure Laterality Date  . Nasal sinus surgery    . Nasal polyp surgery    . Knee arthroscopy      rt knee   History reviewed. No pertinent family history. History  Substance Use Topics  . Smoking status: Former Smoker -- 1.00 packs/day for 10 years    Types: Cigarettes    Quit date: 06/03/1991  . Smokeless tobacco: Never Used  . Alcohol Use: No   OB History   Grav Para Term Preterm Abortions TAB SAB Ect Mult Living                 Review of Systems  Constitutional: Negative for fever and chills.  HENT: Negative for neck pain and neck stiffness.   Respiratory: Negative for cough, chest tightness and shortness of breath.   Cardiovascular: Negative for chest pain,  palpitations and leg swelling.  Musculoskeletal: Positive for joint swelling and arthralgias.  Neurological: Negative for dizziness, weakness, numbness and headaches.  All other systems reviewed and are negative.    Allergies  Review of patient's allergies indicates no known allergies.  Home Medications   Current Outpatient Rx  Name  Route  Sig  Dispense  Refill  . ALPRAZolam (XANAX) 0.25 MG tablet      TAKE 2 TABLETS BY MOUTH TWICE A DAY AS NEEDED   60 tablet   5   . diltiazem (DILACOR XR) 240 MG 24 hr capsule      TAKE ONE CAPSULE BY MOUTH EVERY DAY AS NEEDED FOR HYPERTENSION   30 capsule   6   . fish oil-omega-3 fatty acids 1000 MG capsule   Oral   Take 2 g by mouth daily.           . folic acid-pyridoxine-cyancobalamin (FOLTX) 2.5-25-2 MG TABS   Oral   Take 1 tablet by mouth daily.           Marland Kitchen ipratropium (ATROVENT) 0.02 % nebulizer solution   Nebulization   Take 2.5 mLs (500 mcg total) by nebulization 3 (three) times daily.   250 mL   0   . levothyroxine (SYNTHROID) 150 MCG tablet   Oral   Take  1 tablet (150 mcg total) by mouth daily.   30 tablet   6   . losartan (COZAAR) 100 MG tablet   Oral   Take 1 tablet (100 mg total) by mouth daily.   90 tablet   3   . omeprazole (PRILOSEC) 20 MG capsule      TAKE 1 TABLET BY MOUTH DAILY FOR REFLUX   30 capsule   11   . pantoprazole (PROTONIX) 40 MG tablet      TAKE 1 TABLET BY MOUTH EVERY DAY AS NEEDED FOR RELFUX   30 tablet   11   . sertraline (ZOLOFT) 100 MG tablet   Oral   Take 1 tablet (100 mg total) by mouth daily.   30 tablet   11   . sertraline (ZOLOFT) 100 MG tablet      TAKE 1 TABLET (100 MG TOTAL) BY MOUTH DAILY.   30 tablet   5   . SYNTHROID 150 MCG tablet      TAKE 1 TABLET EVERY DAY   30 tablet   6    BP 167/83  Pulse 79  Temp(Src) 98.2 F (36.8 C) (Oral)  Resp 16  SpO2 95% Physical Exam  Vitals reviewed. Constitutional: She is oriented to person, place, and time.  She appears well-developed and well-nourished. No distress.  HENT:  Head: Normocephalic.  Eyes: Conjunctivae are normal.  Musculoskeletal:  Deformity noted to the right fifth finger. There is a deformity to the proximal phalanx and PIP joint. The distal and of the finger is pointing dorsally and laterally. Good cap refill distally. There is some swelling and bruising over the PIP joint.  Neurological: She is alert and oriented to person, place, and time.    ED Course  Reduction of dislocation Date/Time: 09/29/2012 1:36 PM Performed by: Jaynie Crumble A Authorized by: Jaynie Crumble A Consent: Verbal consent obtained. Risks and benefits: risks, benefits and alternatives were discussed Consent given by: patient Local anesthesia used: yes Anesthesia: digital block Local anesthetic: bupivacaine 0.5% without epinephrine Anesthetic total: 4 ml Patient sedated: no Patient tolerance: Patient tolerated the procedure well with no immediate complications. Comments: Right 5th finger PIP dislocation. Successful closed reduction using traction.    (including critical care time)  NERVE BLOCK Performed by: Jaynie Crumble A Consent: Verbal consent obtained. Required items: required blood products, implants, devices, and special equipment available Time out: Immediately prior to procedure a "time out" was called to verify the correct patient, procedure, equipment, support staff and site/side marked as required.  Indication: right 5th finger dislocation Nerve block body site: finger  Preparation: Patient was prepped and draped in the usual sterile fashion. Needle gauge: 24 G Location technique: anatomical landmarks  Local anesthetic: lidocaine wo epi  Anesthetic total: 4 ml  Outcome: pain improved Patient tolerance: Patient tolerated the procedure well with no immediate complications.    Labs Review Labs Reviewed - No data to display Imaging Review Dg Finger Little  Right  09/29/2012   CLINICAL DATA:  Fall.  EXAM: RIGHT LITTLE FINGER 2+V  COMPARISON:  None.  FINDINGS: Small dorsal plate fracture involving the distal PIP joint is noted. There is mild diffuse soft tissue swelling.  IMPRESSION: Small dorsal plate fracture involves the DIP   Electronically Signed   By: Signa Kell M.D.   On: 09/29/2012 12:47    MDM   1. Closed fracture dislocation of finger, closed, initial encounter     Patient with right fifth finger dislocation at the PIP  joint. It was reduced in ER. X-ray shows small dorsal plate fracture involving the DIP joint. Patient's finger was splinted and buddy taped. She will be discharged home with close followup with a hand specialist.  Filed Vitals:   09/29/12 1121 09/29/12 1229 09/29/12 1329  BP: 167/83 136/80 159/82  Pulse: 79 71 79  Temp: 98.2 F (36.8 C)    TempSrc: Oral    Resp: 16 16 16   SpO2: 95% 94% 97%     Seith Aikey A Summers Buendia, PA-C 09/29/12 1608

## 2012-09-29 NOTE — ED Notes (Signed)
Patient transported to X-ray 

## 2012-09-29 NOTE — ED Provider Notes (Signed)
Medical screening examination/treatment/procedure(s) were performed by non-physician practitioner and as supervising physician I was immediately available for consultation/collaboration.   Shanna Cisco, MD 09/29/12 1622

## 2012-10-13 NOTE — Patient Instructions (Addendum)
CT of the brain is negative for subdural hematoma. Due for physical examination in 6 months. Return as needed.

## 2012-12-02 ENCOUNTER — Other Ambulatory Visit: Payer: Self-pay | Admitting: Internal Medicine

## 2013-01-17 ENCOUNTER — Other Ambulatory Visit: Payer: Self-pay

## 2013-01-17 MED ORDER — ALBUTEROL SULFATE (2.5 MG/3ML) 0.083% IN NEBU: 2.5000 mg | INHALATION_SOLUTION | Freq: Four times a day (QID) | RESPIRATORY_TRACT | Status: DC | PRN

## 2013-02-09 DIAGNOSIS — J069 Acute upper respiratory infection, unspecified: Secondary | ICD-10-CM | POA: Diagnosis not present

## 2013-02-09 DIAGNOSIS — R0982 Postnasal drip: Secondary | ICD-10-CM | POA: Diagnosis not present

## 2013-02-09 DIAGNOSIS — J019 Acute sinusitis, unspecified: Secondary | ICD-10-CM | POA: Diagnosis not present

## 2013-02-10 ENCOUNTER — Other Ambulatory Visit: Payer: Self-pay

## 2013-02-10 DIAGNOSIS — I10 Essential (primary) hypertension: Secondary | ICD-10-CM

## 2013-02-10 MED ORDER — LOSARTAN POTASSIUM 100 MG PO TABS: 100.0000 mg | ORAL_TABLET | Freq: Every day | ORAL | Status: DC

## 2013-02-11 ENCOUNTER — Other Ambulatory Visit: Payer: Self-pay

## 2013-02-11 MED ORDER — ALPRAZOLAM 0.25 MG PO TABS: 0.5000 mg | ORAL_TABLET | Freq: Two times a day (BID) | ORAL | Status: DC | PRN

## 2013-02-11 MED ORDER — OMEPRAZOLE 20 MG PO CPDR: 20.0000 mg | DELAYED_RELEASE_CAPSULE | Freq: Every day | ORAL | Status: DC

## 2013-02-28 DIAGNOSIS — J019 Acute sinusitis, unspecified: Secondary | ICD-10-CM | POA: Diagnosis not present

## 2013-03-04 DIAGNOSIS — IMO0002 Reserved for concepts with insufficient information to code with codable children: Secondary | ICD-10-CM | POA: Diagnosis not present

## 2013-03-04 DIAGNOSIS — M1710 Unilateral primary osteoarthritis, unspecified knee: Secondary | ICD-10-CM | POA: Diagnosis not present

## 2013-03-04 DIAGNOSIS — M25469 Effusion, unspecified knee: Secondary | ICD-10-CM | POA: Diagnosis not present

## 2013-03-04 DIAGNOSIS — M25569 Pain in unspecified knee: Secondary | ICD-10-CM | POA: Diagnosis not present

## 2013-03-11 ENCOUNTER — Other Ambulatory Visit: Payer: Medicare Other | Admitting: Internal Medicine

## 2013-03-11 DIAGNOSIS — E785 Hyperlipidemia, unspecified: Secondary | ICD-10-CM | POA: Diagnosis not present

## 2013-03-11 DIAGNOSIS — I10 Essential (primary) hypertension: Secondary | ICD-10-CM

## 2013-03-11 DIAGNOSIS — E039 Hypothyroidism, unspecified: Secondary | ICD-10-CM

## 2013-03-11 DIAGNOSIS — Z130 Encounter for screening for diseases of the blood and blood-forming organs and certain disorders involving the immune mechanism: Secondary | ICD-10-CM | POA: Diagnosis not present

## 2013-03-11 DIAGNOSIS — Z79899 Other long term (current) drug therapy: Secondary | ICD-10-CM | POA: Diagnosis not present

## 2013-03-11 DIAGNOSIS — Z1329 Encounter for screening for other suspected endocrine disorder: Secondary | ICD-10-CM

## 2013-03-11 DIAGNOSIS — Z13228 Encounter for screening for other metabolic disorders: Secondary | ICD-10-CM

## 2013-03-11 LAB — LIPID PANEL
Cholesterol: 207 mg/dL — ABNORMAL HIGH (ref 0–200)
HDL: 72 mg/dL (ref >39)
LDL Cholesterol: 125 mg/dL — ABNORMAL HIGH (ref 0–99)
TRIGLYCERIDES: 50 mg/dL (ref <150)
Total CHOL/HDL Ratio: 2.9 Ratio
VLDL: 10 mg/dL (ref 0–40)

## 2013-03-11 LAB — CBC WITH DIFFERENTIAL/PLATELET
Basophils Absolute: 0 10*3/uL (ref 0.0–0.1)
Basophils Relative: 0 % (ref 0–1)
EOS ABS: 0.3 10*3/uL (ref 0.0–0.7)
EOS PCT: 6 % — AB (ref 0–5)
HCT: 38.5 % (ref 36.0–46.0)
HEMOGLOBIN: 12.5 g/dL (ref 12.0–15.0)
Lymphocytes Relative: 26 % (ref 12–46)
Lymphs Abs: 1.2 10*3/uL (ref 0.7–4.0)
MCH: 25.8 pg — AB (ref 26.0–34.0)
MCHC: 32.5 g/dL (ref 30.0–36.0)
MCV: 79.4 fL (ref 78.0–100.0)
MONOS PCT: 7 % (ref 3–12)
Monocytes Absolute: 0.3 10*3/uL (ref 0.1–1.0)
Neutro Abs: 2.8 10*3/uL (ref 1.7–7.7)
Neutrophils Relative %: 61 % (ref 43–77)
PLATELETS: 194 10*3/uL (ref 150–400)
RBC: 4.85 MIL/uL (ref 3.87–5.11)
RDW: 15.1 % (ref 11.5–15.5)
WBC: 4.6 10*3/uL (ref 4.0–10.5)

## 2013-03-11 LAB — COMPREHENSIVE METABOLIC PANEL
ALT: 10 U/L (ref 0–35)
AST: 14 U/L (ref 0–37)
Albumin: 3.8 g/dL (ref 3.5–5.2)
Alkaline Phosphatase: 75 U/L (ref 39–117)
BILIRUBIN TOTAL: 0.4 mg/dL (ref 0.2–1.2)
BUN: 25 mg/dL — ABNORMAL HIGH (ref 6–23)
CALCIUM: 9.1 mg/dL (ref 8.4–10.5)
CHLORIDE: 105 meq/L (ref 96–112)
CO2: 31 mEq/L (ref 19–32)
CREATININE: 1.23 mg/dL — AB (ref 0.50–1.10)
Glucose, Bld: 89 mg/dL (ref 70–99)
Potassium: 4.6 mEq/L (ref 3.5–5.3)
SODIUM: 141 meq/L (ref 135–145)
TOTAL PROTEIN: 6.4 g/dL (ref 6.0–8.3)

## 2013-03-11 LAB — TSH: TSH: 0.131 u[IU]/mL — ABNORMAL LOW (ref 0.350–4.500)

## 2013-03-12 LAB — VITAMIN D 25 HYDROXY (VIT D DEFICIENCY, FRACTURES): VIT D 25 HYDROXY: 49 ng/mL (ref 30–89)

## 2013-03-13 ENCOUNTER — Encounter: Payer: Self-pay | Admitting: Internal Medicine

## 2013-03-13 ENCOUNTER — Ambulatory Visit (INDEPENDENT_AMBULATORY_CARE_PROVIDER_SITE_OTHER): Payer: Medicare Other | Admitting: Internal Medicine

## 2013-03-13 VITALS — BP 166/96 | HR 80 | Temp 98.6°F | Ht 60.25 in | Wt 169.0 lb

## 2013-03-13 DIAGNOSIS — F341 Dysthymic disorder: Secondary | ICD-10-CM

## 2013-03-13 DIAGNOSIS — N393 Stress incontinence (female) (male): Secondary | ICD-10-CM | POA: Diagnosis not present

## 2013-03-13 DIAGNOSIS — J329 Chronic sinusitis, unspecified: Secondary | ICD-10-CM | POA: Diagnosis not present

## 2013-03-13 DIAGNOSIS — M1710 Unilateral primary osteoarthritis, unspecified knee: Secondary | ICD-10-CM | POA: Diagnosis not present

## 2013-03-13 DIAGNOSIS — F419 Anxiety disorder, unspecified: Secondary | ICD-10-CM

## 2013-03-13 DIAGNOSIS — F329 Major depressive disorder, single episode, unspecified: Secondary | ICD-10-CM

## 2013-03-13 DIAGNOSIS — Z87891 Personal history of nicotine dependence: Secondary | ICD-10-CM | POA: Diagnosis not present

## 2013-03-13 DIAGNOSIS — I10 Essential (primary) hypertension: Secondary | ICD-10-CM

## 2013-03-13 DIAGNOSIS — Z8709 Personal history of other diseases of the respiratory system: Secondary | ICD-10-CM

## 2013-03-13 DIAGNOSIS — E039 Hypothyroidism, unspecified: Secondary | ICD-10-CM

## 2013-03-13 DIAGNOSIS — Z0000 Encounter for general adult medical examination without abnormal findings: Secondary | ICD-10-CM | POA: Diagnosis not present

## 2013-03-13 DIAGNOSIS — M810 Age-related osteoporosis without current pathological fracture: Secondary | ICD-10-CM

## 2013-03-13 DIAGNOSIS — Z23 Encounter for immunization: Secondary | ICD-10-CM | POA: Diagnosis not present

## 2013-03-13 DIAGNOSIS — J309 Allergic rhinitis, unspecified: Secondary | ICD-10-CM | POA: Diagnosis not present

## 2013-03-13 DIAGNOSIS — IMO0002 Reserved for concepts with insufficient information to code with codable children: Secondary | ICD-10-CM | POA: Diagnosis not present

## 2013-03-13 DIAGNOSIS — Z01810 Encounter for preprocedural cardiovascular examination: Secondary | ICD-10-CM

## 2013-03-13 DIAGNOSIS — K219 Gastro-esophageal reflux disease without esophagitis: Secondary | ICD-10-CM

## 2013-03-13 DIAGNOSIS — M170 Bilateral primary osteoarthritis of knee: Secondary | ICD-10-CM

## 2013-03-13 DIAGNOSIS — F32A Depression, unspecified: Secondary | ICD-10-CM

## 2013-03-13 LAB — POCT URINALYSIS DIPSTICK
Bilirubin, UA: NEGATIVE
Blood, UA: NEGATIVE
Glucose, UA: NEGATIVE
Ketones, UA: NEGATIVE
Leukocytes, UA: NEGATIVE
Nitrite, UA: NEGATIVE
Protein, UA: NEGATIVE
Spec Grav, UA: 1.02
Urobilinogen, UA: NEGATIVE
pH, UA: 6

## 2013-03-13 MED ORDER — TETANUS-DIPHTH-ACELL PERTUSSIS 5-2.5-18.5 LF-MCG/0.5 IM SUSP: 0.5000 mL | Freq: Once | INTRAMUSCULAR | Status: DC

## 2013-03-21 ENCOUNTER — Ambulatory Visit (INDEPENDENT_AMBULATORY_CARE_PROVIDER_SITE_OTHER): Payer: Medicare Other | Admitting: Internal Medicine

## 2013-03-21 ENCOUNTER — Encounter: Payer: Self-pay | Admitting: Internal Medicine

## 2013-03-21 VITALS — BP 130/80

## 2013-03-21 DIAGNOSIS — I10 Essential (primary) hypertension: Secondary | ICD-10-CM

## 2013-03-21 NOTE — Progress Notes (Signed)
   Subjective:    Patient ID: Terri Bridges, female    DOB: 1934/02/06, 79 y.o.   MRN: [id number]  HPI At last visit, she was here for physical examination and surgical clearance for joint replacement. She had not taken her antihypertensive medication prior to coming to the office and blood pressure was elevated so we deferred clearing her for surgery. She is here today having taken her medication as directed. She is closing out her sons a state. This is been stressful for her. Says she wants to get that done before having joint replacement.    Review of Systems     Objective:   Physical Exam Blood pressure 130/80 left arm. Neck is supple without thyromegaly JVD or carotid bruits. Chest clear. Cardiac exam regular rate and rhythm. Extremities without edema.       Assessment & Plan:  Essential hypertension  Plan: Form signed to clear for joint replacement surgery. Patient given copy of form.

## 2013-03-21 NOTE — Patient Instructions (Addendum)
Form signed for joint replacement surgery approval. Return in 6 months for six-month recheck.

## 2013-03-25 ENCOUNTER — Other Ambulatory Visit: Payer: Self-pay | Admitting: Orthopedic Surgery

## 2013-03-30 ENCOUNTER — Encounter: Payer: Self-pay | Admitting: Internal Medicine

## 2013-03-30 NOTE — Patient Instructions (Signed)
Return for blood pressure check when you have had her medicine prior to coming to the office. We cannot approve you for preoperative clearance until blood pressure is normal.

## 2013-03-30 NOTE — Progress Notes (Signed)
Subjective:    Patient ID: Terri Bridges, female    DOB: Oct 20, 1934, 79 y.o.   MRN: [id number]  HPI 79 year old White female in today for preop medical evaluation. Patient plans to have joint replacement in the near future by Dr. Ronnie Derby. Unfortunately has not taken antihypertensive medications this morning. Blood pressure is elevated in office today.  She has a history of chronic recurrent sinusitis and COPD. Has been chronically depressed for years. History of hypothyroidism and hypertension. History of allergic rhinitis. History of GE reflux and osteopenia. History of stress urinary incontinence.  History of bilateral sphenoethmoidectomies, bilateral nasal polypectomies, bilateral maxillary antrostomies in 1991 by Dr. Purcell Nails. Right intranasal ethmoidectomy,  drainage of right periorbital abscess on and March 31, 1991. Left ethmoidectomy, left sphenoidectomy, transnasal frontal sinusotomy April 07, 1991.  Arthroscopic surgery right knee February 2004. Bilateral grommet T tubes placed February 2004. Left cataract extraction January 2013. Right rotator cuff repair 2011.  Patient had colonoscopy by Dr. Oletta Lamas December 2008. She had upper GI endoscopy at the same time showing a hiatal hernia.  History of asthma and allergic rhinitis treated by Dr. Neldon Mc.  History of exercise tolerance test to evaluate chest pain by Dr. Doreatha Lew November 1990. Study was clinically and electrocardiographically negative. Dr. Verl Blalock saw her in 2005 for palpitations. He placed her on Cardizem with improvement in blood pressure and palpitations. She had a cardiac catheterization at that time showing no significant coronary artery disease. Ejection fraction was 65% with a hyperdynamic ventricle.  Patient had osteoporosis screening February 2013 at [facility] with T score in left femoral neck of -2.5 and T score at L4 being -2.5. Because of GE reflux, it is not likely she would tolerate Fosamax. She was on Actonel for while  in 2002 but it was discontinued. Calcium and vitamin D supplements have been recommended.  She has difficulty affording medications and we have given her many samples.  She is a former heavy smoker having smoked one to 2 packs of cigarettes daily for some 10 years. She quit smoking in 1993.  Family history: Father died at 39 of an MI. Mother died at 60 of an MI. Son was found unconscious and died presumably of an MI  Summer  2014  with history of alcoholism and drug use in the remote past.  Social history: Patient resides with her granddaughter who has history of leg deformity and has had multiple surgeries. Patient is divorced. 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 Prior to that she worked at [employer]. 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 She struggles financially. Patient does not consume alcohol. Quit smoking in 1993.    Review of Systems  Constitutional: Positive for fatigue.  HENT:       Denies sinusitis symptoms today  Eyes:       History of left cataract extraction  Respiratory: Negative.   Cardiovascular: Negative for chest pain, palpitations and leg swelling.  Gastrointestinal:       History of GE reflux  Endocrine:       Hypothyroidism  Genitourinary:       Stress urinary incontinence  Musculoskeletal:       Joint pain  Allergic/Immunologic:       History of allergic rhinitis  Neurological:       History of headaches  Psychiatric/Behavioral:       Anxiety depression  Objective:   Physical Exam  Vitals reviewed. Constitutional: She appears well-developed and well-nourished. No distress.  HENT:  Head: Normocephalic and atraumatic.  Left Ear: External ear normal.  Mouth/Throat: No oropharyngeal exudate.  Eyes: Conjunctivae and EOM are normal. Pupils are equal, round, and reactive to light. Right eye exhibits no discharge. Left eye exhibits  no discharge. No scleral icterus.  Neck: Neck supple. No JVD present. No thyromegaly present.  Cardiovascular: Normal rate, normal heart sounds and intact distal pulses.   No murmur heard. Pulmonary/Chest: Effort normal and breath sounds normal. No respiratory distress. She has no wheezes. She has no rales. She exhibits no tenderness.  Breasts normal female  Abdominal: Soft. Bowel sounds are normal. She exhibits no distension and no mass. There is no rebound and no guarding.  Genitourinary:  Deferred  Musculoskeletal: She exhibits no edema.  Lymphadenopathy:    She has no cervical adenopathy.  Neurological: She is alert. She has normal reflexes. No cranial nerve deficit. Coordination normal.  Skin: Skin is warm and dry. No rash noted. She is not diaphoretic.  Psychiatric: Her behavior is normal. Judgment and thought content normal.  Dysphoric mood          Assessment & Plan:  Hypertension-did not take antihypertensive medications this morning before coming to office therefore we will have to defer preop clearance. She'll need to return when she has had her medications in the next week or so.  Anxiety depression  History of chronic recurrent sinusitis  Osteoporosis  Hypothyroidism  History of smoking and COPD  Osteoarthritis  History of headaches  History of GE reflux  History of stress urinary incontinence  History of vertigo  Plan: Return for repeat blood pressure check and office visit prior to approving preoperative clearance. TSH is always low. She generally feels better on current dose of Synthroid so will not change at this point in time. She is on diltiazem on losartan for hypertension. Takes Protonix for GE reflux. Dose of Synthroid is 0.15 mg daily. Takes Zoloft and Xanax for anxiety depression.  Subjective:   Patient presents for Medicare Annual/Subsequent preventive examination.  Review Past Medical/Family/Social: See above   Risk Factors  Current  exercise habits: Sedentary Dietary issues discussed: Low-fat low-carb  Cardiac risk factors: Hypertension and family history  Depression Screen  (Note: if answer to either of the following is "Yes", a more complete depression screening is indicated)   Over the past two weeks, have you felt down, depressed or hopeless? No  Over the past two weeks, have you felt little interest or pleasure in doing things? No Have you lost interest or pleasure in daily life? No Do you often feel hopeless? No Do you cry easily over simple problems? yes  Activities of Daily Living  In your present state of health, do you have any difficulty performing the following activities?:   Driving? No  Managing money? No  Feeding yourself? No  Getting from bed to chair? No  Climbing a flight of stairs? yes Preparing food and eating?: No  Bathing or showering? No  Getting dressed: No  Getting to the toilet? No  Using the toilet:No  Moving around from place to place: No  In the past year have you fallen or had a near fall?: yes Are you sexually active? No  Do you have more than one partner? No   Hearing Difficulties: No  Do you often ask people to speak up or repeat themselves? yes Do you experience ringing or noises  in your ears? yes Do you have difficulty understanding soft or whispered voices? yes Do you feel that you have a problem with memory?  yes Do you often misplace items?  yes   Home Safety:  Do you have a smoke alarm at your residence? Yes Do you have grab bars in the bathroom? No Do you have throw rugs in your house? No   Cognitive Testing  Alert? Yes Normal Appearance?Yes  Oriented to person? Yes Place? Yes  Time? Yes  Recall of three objects? Yes  Can perform simple calculations? Yes  Displays appropriate judgment?Yes  Can read the correct time from a watch face?Yes   List the Names of Other Physician/Practitioners you currently use:  See referral list for the physicians patient  is currently seeing.  Dr. Ronnie Derby   Review of Systems: See above   Objective:     General appearance: Appears stated age and mildly obese  Head: Normocephalic, without obvious abnormality, atraumatic  Eyes: conj clear, EOMi PEERLA  Ears: normal TM's and external ear canals both ears  Nose: Nares normal. Septum midline. Mucosa normal. No drainage or sinus tenderness.  Throat: lips, mucosa, and tongue normal; teeth and gums normal  Neck: no adenopathy, no carotid bruit, no JVD, supple, symmetrical, trachea midline and thyroid not enlarged, symmetric, no tenderness/mass/nodules  No CVA tenderness.  Lungs: clear to auscultation bilaterally  Breasts: normal appearance, no masses or tenderness Heart: regular rate and rhythm, S1, S2 normal, no murmur, click, rub or gallop  Abdomen: soft, non-tender; bowel sounds normal; no masses, no organomegaly  Musculoskeletal: ROM normal in all joints, no crepitus, no deformity, Normal muscle strengthen. Back  is symmetric, no curvature. Skin: Skin color, texture, turgor normal. No rashes or lesions  Lymph nodes: Cervical, supraclavicular, and axillary nodes normal.  Neurologic: CN 2 -12 Normal, Normal symmetric reflexes. Normal coordination and gait  Psych: Alert & Oriented x 3, Mood appear stable.    Assessment:    Annual wellness medicare exam   Plan:    During the course of the visit the patient was educated and counseled about appropriate screening and preventive services including:  Recommend annual mammogram and influenza immunization      Patient Instructions (the written plan) was given to the patient.  Medicare Attestation  I have personally reviewed:  The patient's medical and social history  Their use of alcohol, tobacco or illicit drugs  Their current medications and supplements  The patient's functional ability including ADLs,fall risks, home safety risks, cognitive, and hearing and visual impairment  Diet and physical  activities  Evidence for depression or mood disorders  The patient's weight, height, BMI, and visual acuity have been recorded in the chart. I have made referrals, counseling, and provided education to the patient based on review of the above and I have provided the patient with a written personalized care plan for preventive services.

## 2013-04-08 ENCOUNTER — Encounter (HOSPITAL_COMMUNITY): Payer: Self-pay | Admitting: Pharmacy Technician

## 2013-04-08 NOTE — Pre-Procedure Instructions (Signed)
Terri Bridges  04/08/2013   Your procedure is scheduled on:  Mon, April 13 @ 8:45 AM  Report to [facility]  at 6:45 AM.  Call this number if you have problems the morning of surgery: [phone]   Remember:   Do not eat food or drink liquids after midnight.   Take these medicines the morning of surgery with A SIP OF WATER: Albuterol<Bring Your Inhaler With You>,Xanax(Alprazolam),Diltiazem(Dilacor),Flonase(Fluticasone),Pain Pill(if needed),Atrovent Neb treatment,Synthroid(Levothyroxine),Omeprazole(Prilosec),Pantoprazole(Protonix-if needed),Cleocin(Clindamycin),and Zoloft(Sertraline)               Stop taking your Fish Oil and Ibuprofen. No Goody's,BC's,Aleve,or any Herbal Medications   Do not wear jewelry, make-up or nail polish.  Do not wear lotions, powders, or perfumes. You may wear deodorant.  Do not shave 48 hours prior to surgery.   Do not bring valuables to the hospital.  [facility] is not responsible                  for any belongings or valuables.               Contacts, dentures or bridgework may not be worn into surgery.  Leave suitcase in the car. After surgery it may be brought to your room.  For patients admitted to the hospital, discharge time is determined by your                treatment team.               Special Instructions:  [place] - Preparing for Surgery  Before surgery, you can play an important role.  Because skin is not sterile, your skin needs to be as free of germs as possible.  You can reduce the number of germs on you skin by washing with CHG (chlorahexidine gluconate) soap before surgery.  CHG is an antiseptic cleaner which kills germs and bonds with the skin to continue killing germs even after washing.  Please DO NOT use if you have an allergy to CHG or antibacterial soaps.  If your skin becomes reddened/irritated stop using the CHG and inform your nurse when you arrive at Short Stay.  Do not shave (including legs and underarms) for  at least 48 hours prior to the first CHG shower.  You may shave your face.  Please follow these instructions carefully:   1.  Shower with CHG Soap the night before surgery and the                                morning of Surgery.  2.  If you choose to wash your hair, wash your hair first as usual with your       normal shampoo.  3.  After you shampoo, rinse your hair and body thoroughly to remove the                      Shampoo.  4.  Use CHG as you would any other liquid soap.  You can apply chg directly       to the skin and wash gently with scrungie or a clean washcloth.  5.  Apply the CHG Soap to your body ONLY FROM THE NECK DOWN.        Do not use on open wounds or open sores.  Avoid contact with your eyes,       ears, mouth and genitals (private parts).  Wash  genitals (private parts)       with your normal soap.  6.  Wash thoroughly, paying special attention to the area where your surgery        will be performed.  7.  Thoroughly rinse your body with warm water from the neck down.  8.  DO NOT shower/wash with your normal soap after using and rinsing off       the CHG Soap.  9.  Pat yourself dry with a clean towel.            10.  Wear clean pajamas.            11.  Place clean sheets on your bed the night of your first shower and do not        sleep with pets.  Day of Surgery  Do not apply any lotions/deoderants the morning of surgery.  Please wear clean clothes to the hospital/surgery center.     Please read over the following fact sheets that you were given: Pain Booklet, Coughing and Deep Breathing, Blood Transfusion Information, MRSA Information and Surgical Site Infection Prevention

## 2013-04-09 ENCOUNTER — Encounter (HOSPITAL_COMMUNITY)
Admission: RE | Admit: 2013-04-09 | Discharge: 2013-04-09 | Disposition: A | Discharge status: Home / Self Care | Payer: Medicare Other | Source: Ambulatory Visit | Attending: Orthopedic Surgery | Admitting: Orthopedic Surgery

## 2013-04-09 ENCOUNTER — Encounter (HOSPITAL_COMMUNITY): Payer: Self-pay

## 2013-04-09 DIAGNOSIS — Z01818 Encounter for other preprocedural examination: Secondary | ICD-10-CM | POA: Diagnosis not present

## 2013-04-09 DIAGNOSIS — Z01812 Encounter for preprocedural laboratory examination: Secondary | ICD-10-CM | POA: Insufficient documentation

## 2013-04-09 HISTORY — DX: Nocturia: R35.1

## 2013-04-09 HISTORY — DX: Urgency of urination: R39.15

## 2013-04-09 HISTORY — DX: Pain in unspecified joint: M25.50

## 2013-04-09 HISTORY — DX: Unspecified osteoarthritis, unspecified site: M19.90

## 2013-04-09 HISTORY — DX: Dry eye syndrome of bilateral lacrimal glands: H04.123

## 2013-04-09 HISTORY — DX: Effusion, unspecified joint: M25.40

## 2013-04-09 HISTORY — DX: Personal history of other diseases of the respiratory system: Z87.09

## 2013-04-09 HISTORY — DX: Age-related osteoporosis without current pathological fracture: M81.0

## 2013-04-09 HISTORY — DX: Frequency of micturition: R35.0

## 2013-04-09 HISTORY — DX: Tinnitus, unspecified ear: H93.19

## 2013-04-09 HISTORY — DX: Hypothyroidism, unspecified: E03.9

## 2013-04-09 HISTORY — DX: Constipation, unspecified: K59.00

## 2013-04-09 HISTORY — DX: Unspecified urinary incontinence: R32

## 2013-04-09 LAB — SURGICAL PCR SCREEN
MRSA, PCR: NEGATIVE
STAPHYLOCOCCUS AUREUS: NEGATIVE

## 2013-04-09 LAB — COMPREHENSIVE METABOLIC PANEL
ALT: 11 U/L (ref 0–35)
AST: 17 U/L (ref 0–37)
Albumin: 3.6 g/dL (ref 3.5–5.2)
Alkaline Phosphatase: 83 U/L (ref 39–117)
BUN: 26 mg/dL — AB (ref 6–23)
CO2: 26 mEq/L (ref 19–32)
Calcium: 9.1 mg/dL (ref 8.4–10.5)
Chloride: 104 mEq/L (ref 96–112)
Creatinine, Ser: 1.04 mg/dL (ref 0.50–1.10)
GFR calc Af Amer: 59 mL/min — ABNORMAL LOW (ref >90)
GFR calc non Af Amer: 51 mL/min — ABNORMAL LOW (ref >90)
Glucose, Bld: 85 mg/dL (ref 70–99)
POTASSIUM: 4.4 meq/L (ref 3.7–5.3)
Sodium: 142 mEq/L (ref 137–147)
TOTAL PROTEIN: 6.7 g/dL (ref 6.0–8.3)
Total Bilirubin: 0.3 mg/dL (ref 0.3–1.2)

## 2013-04-09 LAB — ABO/RH: ABO/RH(D): O POS

## 2013-04-09 LAB — CBC WITH DIFFERENTIAL/PLATELET
BASOS ABS: 0 10*3/uL (ref 0.0–0.1)
BASOS PCT: 1 % (ref 0–1)
EOS ABS: 0.3 10*3/uL (ref 0.0–0.7)
Eosinophils Relative: 7 % — ABNORMAL HIGH (ref 0–5)
HCT: 40 % (ref 36.0–46.0)
Hemoglobin: 13.1 g/dL (ref 12.0–15.0)
Lymphocytes Relative: 31 % (ref 12–46)
Lymphs Abs: 1.5 10*3/uL (ref 0.7–4.0)
MCH: 26.6 pg (ref 26.0–34.0)
MCHC: 32.8 g/dL (ref 30.0–36.0)
MCV: 81.1 fL (ref 78.0–100.0)
MONOS PCT: 8 % (ref 3–12)
Monocytes Absolute: 0.4 10*3/uL (ref 0.1–1.0)
NEUTROS PCT: 53 % (ref 43–77)
Neutro Abs: 2.6 10*3/uL (ref 1.7–7.7)
Platelets: 178 10*3/uL (ref 150–400)
RBC: 4.93 MIL/uL (ref 3.87–5.11)
RDW: 14.1 % (ref 11.5–15.5)
WBC: 4.8 10*3/uL (ref 4.0–10.5)

## 2013-04-09 LAB — TYPE AND SCREEN
ABO/RH(D): O POS
Antibody Screen: NEGATIVE

## 2013-04-09 LAB — PROTIME-INR
INR: 0.99 (ref 0.00–1.49)
PROTHROMBIN TIME: 12.9 s (ref 11.6–15.2)

## 2013-04-09 LAB — URINALYSIS, ROUTINE W REFLEX MICROSCOPIC
Bilirubin Urine: NEGATIVE
Glucose, UA: NEGATIVE mg/dL
Hgb urine dipstick: NEGATIVE
Ketones, ur: NEGATIVE mg/dL
LEUKOCYTES UA: NEGATIVE
Nitrite: NEGATIVE
Protein, ur: NEGATIVE mg/dL
SPECIFIC GRAVITY, URINE: 1.023 (ref 1.005–1.030)
Urobilinogen, UA: 0.2 mg/dL (ref 0.0–1.0)
pH: 5.5 (ref 5.0–8.0)

## 2013-04-09 LAB — APTT: aPTT: 29 seconds (ref 24–37)

## 2013-04-09 MED ORDER — CHLORHEXIDINE GLUCONATE 4 % EX LIQD: 60.0000 mL | Freq: Once | CUTANEOUS | Status: DC

## 2013-04-09 NOTE — Progress Notes (Addendum)
Saw Dr.Wall in 2005 but not since  Stress test done at least 45yrs ago  Denies ever having an echo  EKG to be requested from [facility]  Denies CXR in past yr   Heart cath in epic from 2005  Medical Md is Dr.Vaanya Renold Genta

## 2013-04-10 LAB — URINE CULTURE: Colony Count: 4000

## 2013-04-11 DIAGNOSIS — IMO0002 Reserved for concepts with insufficient information to code with codable children: Secondary | ICD-10-CM | POA: Diagnosis not present

## 2013-04-11 DIAGNOSIS — M25569 Pain in unspecified knee: Secondary | ICD-10-CM | POA: Insufficient documentation

## 2013-04-11 DIAGNOSIS — M1710 Unilateral primary osteoarthritis, unspecified knee: Secondary | ICD-10-CM | POA: Diagnosis not present

## 2013-04-20 MED ORDER — CEFAZOLIN SODIUM-DEXTROSE 2-3 GM-% IV SOLR
2.0000 g | INTRAVENOUS | Status: AC
  Administered 2013-04-21: 2 g via INTRAVENOUS
  Filled 2013-04-20: qty 50

## 2013-04-20 MED ORDER — TRANEXAMIC ACID 100 MG/ML IV SOLN
1000.0000 mg | INTRAVENOUS | Status: AC
  Administered 2013-04-21: 1000 mg via INTRAVENOUS
  Filled 2013-04-20: qty 10

## 2013-04-21 ENCOUNTER — Inpatient Hospital Stay (HOSPITAL_COMMUNITY): Payer: Medicare Other | Admitting: Anesthesiology

## 2013-04-21 ENCOUNTER — Encounter (HOSPITAL_COMMUNITY)
Admission: RE | Disposition: A | Discharge status: Skilled Nursing Facility | Payer: Self-pay | Source: Ambulatory Visit | Attending: Orthopedic Surgery

## 2013-04-21 ENCOUNTER — Inpatient Hospital Stay (HOSPITAL_COMMUNITY)
Admission: RE | Admit: 2013-04-21 | Discharge: 2013-04-24 | DRG: 470 | Disposition: A | Discharge status: Skilled Nursing Facility | Payer: Medicare Other | Source: Ambulatory Visit | Attending: Orthopedic Surgery | Admitting: Orthopedic Surgery

## 2013-04-21 ENCOUNTER — Encounter (HOSPITAL_COMMUNITY): Payer: Self-pay | Admitting: Certified Registered Nurse Anesthetist

## 2013-04-21 ENCOUNTER — Encounter (HOSPITAL_COMMUNITY): Payer: Medicare Other | Admitting: Anesthesiology

## 2013-04-21 DIAGNOSIS — D62 Acute posthemorrhagic anemia: Secondary | ICD-10-CM | POA: Diagnosis not present

## 2013-04-21 DIAGNOSIS — Z87891 Personal history of nicotine dependence: Secondary | ICD-10-CM

## 2013-04-21 DIAGNOSIS — J45909 Unspecified asthma, uncomplicated: Secondary | ICD-10-CM | POA: Diagnosis not present

## 2013-04-21 DIAGNOSIS — F3289 Other specified depressive episodes: Secondary | ICD-10-CM | POA: Diagnosis present

## 2013-04-21 DIAGNOSIS — J4489 Other specified chronic obstructive pulmonary disease: Secondary | ICD-10-CM | POA: Diagnosis not present

## 2013-04-21 DIAGNOSIS — J449 Chronic obstructive pulmonary disease, unspecified: Secondary | ICD-10-CM | POA: Diagnosis not present

## 2013-04-21 DIAGNOSIS — M1710 Unilateral primary osteoarthritis, unspecified knee: Secondary | ICD-10-CM | POA: Diagnosis not present

## 2013-04-21 DIAGNOSIS — E785 Hyperlipidemia, unspecified: Secondary | ICD-10-CM | POA: Diagnosis not present

## 2013-04-21 DIAGNOSIS — E039 Hypothyroidism, unspecified: Secondary | ICD-10-CM | POA: Diagnosis present

## 2013-04-21 DIAGNOSIS — K219 Gastro-esophageal reflux disease without esophagitis: Secondary | ICD-10-CM | POA: Diagnosis present

## 2013-04-21 DIAGNOSIS — Z79899 Other long term (current) drug therapy: Secondary | ICD-10-CM

## 2013-04-21 DIAGNOSIS — I10 Essential (primary) hypertension: Secondary | ICD-10-CM | POA: Diagnosis present

## 2013-04-21 DIAGNOSIS — F329 Major depressive disorder, single episode, unspecified: Secondary | ICD-10-CM | POA: Diagnosis present

## 2013-04-21 DIAGNOSIS — F411 Generalized anxiety disorder: Secondary | ICD-10-CM | POA: Diagnosis present

## 2013-04-21 DIAGNOSIS — G8918 Other acute postprocedural pain: Secondary | ICD-10-CM | POA: Diagnosis not present

## 2013-04-21 DIAGNOSIS — Z96659 Presence of unspecified artificial knee joint: Secondary | ICD-10-CM

## 2013-04-21 DIAGNOSIS — Z5189 Encounter for other specified aftercare: Secondary | ICD-10-CM | POA: Diagnosis not present

## 2013-04-21 DIAGNOSIS — N183 Chronic kidney disease, stage 3 unspecified: Secondary | ICD-10-CM | POA: Diagnosis not present

## 2013-04-21 DIAGNOSIS — D649 Anemia, unspecified: Secondary | ICD-10-CM | POA: Diagnosis not present

## 2013-04-21 DIAGNOSIS — IMO0002 Reserved for concepts with insufficient information to code with codable children: Secondary | ICD-10-CM | POA: Diagnosis not present

## 2013-04-21 DIAGNOSIS — D696 Thrombocytopenia, unspecified: Secondary | ICD-10-CM | POA: Diagnosis not present

## 2013-04-21 DIAGNOSIS — Z471 Aftercare following joint replacement surgery: Secondary | ICD-10-CM | POA: Diagnosis not present

## 2013-04-21 HISTORY — DX: Low back pain: M54.5

## 2013-04-21 HISTORY — DX: Other chronic pain: G89.29

## 2013-04-21 HISTORY — DX: Shortness of breath: R06.02

## 2013-04-21 HISTORY — DX: Low back pain, unspecified: M54.50

## 2013-04-21 HISTORY — DX: Scoliosis, unspecified: M41.9

## 2013-04-21 HISTORY — PX: TOTAL KNEE ARTHROPLASTY: SHX125

## 2013-04-21 LAB — CBC
HCT: 35.6 % — ABNORMAL LOW (ref 36.0–46.0)
Hemoglobin: 11.4 g/dL — ABNORMAL LOW (ref 12.0–15.0)
MCH: 26.5 pg (ref 26.0–34.0)
MCHC: 32 g/dL (ref 30.0–36.0)
MCV: 82.6 fL (ref 78.0–100.0)
Platelets: 148 10*3/uL — ABNORMAL LOW (ref 150–400)
RBC: 4.31 MIL/uL (ref 3.87–5.11)
RDW: 13.9 % (ref 11.5–15.5)
WBC: 8.4 10*3/uL (ref 4.0–10.5)

## 2013-04-21 LAB — CREATININE, SERUM
CREATININE: 1.01 mg/dL (ref 0.50–1.10)
GFR calc Af Amer: 61 mL/min — ABNORMAL LOW (ref >90)
GFR calc non Af Amer: 53 mL/min — ABNORMAL LOW (ref >90)

## 2013-04-21 SURGERY — ARTHROPLASTY, KNEE, TOTAL
Anesthesia: General | Site: Knee | Laterality: Left

## 2013-04-21 MED ORDER — SODIUM CHLORIDE 0.9 % IV SOLN
INTRAVENOUS | Status: DC
  Administered 2013-04-21: 16:00:00 via INTRAVENOUS

## 2013-04-21 MED ORDER — MENTHOL 3 MG MT LOZG: 1.0000 | LOZENGE | OROMUCOSAL | Status: DC | PRN

## 2013-04-21 MED ORDER — SERTRALINE HCL 100 MG PO TABS
100.0000 mg | ORAL_TABLET | Freq: Every day | ORAL | Status: DC
  Administered 2013-04-21 – 2013-04-24 (×4): 100 mg via ORAL
  Filled 2013-04-21 (×4): qty 1

## 2013-04-21 MED ORDER — BUPIVACAINE-EPINEPHRINE (PF) 0.5% -1:200000 IJ SOLN
INTRAMUSCULAR | Status: AC
  Filled 2013-04-21: qty 10

## 2013-04-21 MED ORDER — FENTANYL CITRATE 0.05 MG/ML IJ SOLN
INTRAMUSCULAR | Status: DC | PRN
  Administered 2013-04-21: 50 ug via INTRAVENOUS
  Administered 2013-04-21: 25 ug via INTRAVENOUS
  Administered 2013-04-21: 50 ug via INTRAVENOUS
  Administered 2013-04-21 (×3): 25 ug via INTRAVENOUS
  Administered 2013-04-21: 50 ug via INTRAVENOUS

## 2013-04-21 MED ORDER — SODIUM CHLORIDE 0.9 % IR SOLN
Status: DC | PRN
  Administered 2013-04-21: 1000 mL

## 2013-04-21 MED ORDER — LEVOTHYROXINE SODIUM 150 MCG PO TABS
150.0000 ug | ORAL_TABLET | Freq: Every day | ORAL | Status: DC
  Administered 2013-04-22 – 2013-04-24 (×3): 150 ug via ORAL
  Filled 2013-04-21 (×4): qty 1

## 2013-04-21 MED ORDER — DOCUSATE SODIUM 100 MG PO CAPS
100.0000 mg | ORAL_CAPSULE | Freq: Two times a day (BID) | ORAL | Status: DC
  Administered 2013-04-21 – 2013-04-24 (×6): 100 mg via ORAL
  Filled 2013-04-21 (×7): qty 1

## 2013-04-21 MED ORDER — ONDANSETRON HCL 4 MG/2ML IJ SOLN
4.0000 mg | Freq: Four times a day (QID) | INTRAMUSCULAR | Status: DC | PRN
  Administered 2013-04-21: 4 mg via INTRAVENOUS
  Filled 2013-04-21: qty 2

## 2013-04-21 MED ORDER — MIDAZOLAM HCL 2 MG/2ML IJ SOLN
INTRAMUSCULAR | Status: AC
  Administered 2013-04-21: 2 mg
  Filled 2013-04-21: qty 2

## 2013-04-21 MED ORDER — METHOCARBAMOL 500 MG PO TABS
500.0000 mg | ORAL_TABLET | Freq: Four times a day (QID) | ORAL | Status: DC | PRN
  Administered 2013-04-21 – 2013-04-23 (×2): 500 mg via ORAL
  Filled 2013-04-21: qty 1

## 2013-04-21 MED ORDER — PROPOFOL 10 MG/ML IV BOLUS
INTRAVENOUS | Status: AC
  Filled 2013-04-21: qty 20

## 2013-04-21 MED ORDER — SODIUM CHLORIDE 0.9 % IV SOLN: INTRAVENOUS | Status: DC

## 2013-04-21 MED ORDER — PHENOL 1.4 % MT LIQD: 1.0000 | OROMUCOSAL | Status: DC | PRN

## 2013-04-21 MED ORDER — OXYCODONE HCL 5 MG/5ML PO SOLN: 5.0000 mg | Freq: Once | ORAL | Status: AC | PRN

## 2013-04-21 MED ORDER — BUPIVACAINE LIPOSOME 1.3 % IJ SUSP
INTRAMUSCULAR | Status: DC | PRN
  Administered 2013-04-21: 20 mL

## 2013-04-21 MED ORDER — ACETAMINOPHEN 325 MG PO TABS: 650.0000 mg | ORAL_TABLET | Freq: Four times a day (QID) | ORAL | Status: DC | PRN

## 2013-04-21 MED ORDER — FLEET ENEMA 7-19 GM/118ML RE ENEM: 1.0000 | ENEMA | Freq: Once | RECTAL | Status: AC | PRN

## 2013-04-21 MED ORDER — BUPIVACAINE-EPINEPHRINE 0.5% -1:200000 IJ SOLN
INTRAMUSCULAR | Status: DC | PRN
  Administered 2013-04-21: 30 mL

## 2013-04-21 MED ORDER — LIDOCAINE HCL (CARDIAC) 20 MG/ML IV SOLN
INTRAVENOUS | Status: AC
  Filled 2013-04-21: qty 5

## 2013-04-21 MED ORDER — SUCCINYLCHOLINE CHLORIDE 20 MG/ML IJ SOLN
INTRAMUSCULAR | Status: AC
  Filled 2013-04-21: qty 1

## 2013-04-21 MED ORDER — ROCURONIUM BROMIDE 50 MG/5ML IV SOLN
INTRAVENOUS | Status: AC
  Filled 2013-04-21: qty 1

## 2013-04-21 MED ORDER — PANTOPRAZOLE SODIUM 40 MG PO TBEC: 40.0000 mg | DELAYED_RELEASE_TABLET | Freq: Every day | ORAL | Status: DC | PRN

## 2013-04-21 MED ORDER — FENTANYL CITRATE 0.05 MG/ML IJ SOLN
INTRAMUSCULAR | Status: AC
  Filled 2013-04-21: qty 5

## 2013-04-21 MED ORDER — ONDANSETRON HCL 4 MG/2ML IJ SOLN
INTRAMUSCULAR | Status: DC | PRN
  Administered 2013-04-21: 4 mg via INTRAVENOUS

## 2013-04-21 MED ORDER — LIDOCAINE HCL (CARDIAC) 20 MG/ML IV SOLN
INTRAVENOUS | Status: DC | PRN
  Administered 2013-04-21: 100 mg via INTRAVENOUS

## 2013-04-21 MED ORDER — METOCLOPRAMIDE HCL 10 MG PO TABS: 5.0000 mg | ORAL_TABLET | Freq: Three times a day (TID) | ORAL | Status: DC | PRN

## 2013-04-21 MED ORDER — FENTANYL CITRATE 0.05 MG/ML IJ SOLN
INTRAMUSCULAR | Status: AC
  Administered 2013-04-21: 50 ug
  Filled 2013-04-21: qty 2

## 2013-04-21 MED ORDER — MEPERIDINE HCL 25 MG/ML IJ SOLN: 6.2500 mg | INTRAMUSCULAR | Status: DC | PRN

## 2013-04-21 MED ORDER — EPHEDRINE SULFATE 50 MG/ML IJ SOLN
INTRAMUSCULAR | Status: AC
  Filled 2013-04-21: qty 1

## 2013-04-21 MED ORDER — DIPHENHYDRAMINE HCL 12.5 MG/5ML PO ELIX: 12.5000 mg | ORAL_SOLUTION | ORAL | Status: DC | PRN

## 2013-04-21 MED ORDER — ALUM & MAG HYDROXIDE-SIMETH 200-200-20 MG/5ML PO SUSP: 30.0000 mL | ORAL | Status: DC | PRN

## 2013-04-21 MED ORDER — HYDROMORPHONE HCL PF 1 MG/ML IJ SOLN: 1.0000 mg | INTRAMUSCULAR | Status: DC | PRN

## 2013-04-21 MED ORDER — 0.9 % SODIUM CHLORIDE (POUR BTL) OPTIME
TOPICAL | Status: DC | PRN
  Administered 2013-04-21: 1000 mL

## 2013-04-21 MED ORDER — OXYCODONE HCL ER 10 MG PO T12A
10.0000 mg | EXTENDED_RELEASE_TABLET | Freq: Two times a day (BID) | ORAL | Status: DC
  Administered 2013-04-21: 10 mg via ORAL
  Filled 2013-04-21 (×2): qty 1

## 2013-04-21 MED ORDER — ONDANSETRON HCL 4 MG/2ML IJ SOLN
INTRAMUSCULAR | Status: AC
  Filled 2013-04-21: qty 2

## 2013-04-21 MED ORDER — CEFAZOLIN SODIUM 1-5 GM-% IV SOLN
1.0000 g | Freq: Four times a day (QID) | INTRAVENOUS | Status: AC
  Administered 2013-04-21 (×2): 1 g via INTRAVENOUS
  Filled 2013-04-21 (×2): qty 50

## 2013-04-21 MED ORDER — DIPHENHYDRAMINE HCL 50 MG/ML IJ SOLN
INTRAMUSCULAR | Status: AC
  Administered 2013-04-21: 6.25 mg
  Filled 2013-04-21: qty 1

## 2013-04-21 MED ORDER — SODIUM CHLORIDE 0.9 % IJ SOLN
INTRAMUSCULAR | Status: AC
  Filled 2013-04-21: qty 10

## 2013-04-21 MED ORDER — METHOCARBAMOL 500 MG PO TABS
ORAL_TABLET | ORAL | Status: AC
  Administered 2013-04-21: 500 mg via ORAL
  Filled 2013-04-21: qty 1

## 2013-04-21 MED ORDER — FLUTICASONE PROPIONATE 50 MCG/ACT NA SUSP: 2.0000 | Freq: Every day | NASAL | Status: DC | PRN

## 2013-04-21 MED ORDER — ALBUTEROL SULFATE (2.5 MG/3ML) 0.083% IN NEBU
2.5000 mg | INHALATION_SOLUTION | Freq: Four times a day (QID) | RESPIRATORY_TRACT | Status: DC | PRN
  Administered 2013-04-21 – 2013-04-23 (×3): 2.5 mg via RESPIRATORY_TRACT
  Filled 2013-04-21 (×3): qty 3

## 2013-04-21 MED ORDER — ENOXAPARIN SODIUM 30 MG/0.3ML ~~LOC~~ SOLN
30.0000 mg | Freq: Two times a day (BID) | SUBCUTANEOUS | Status: DC
  Administered 2013-04-22 – 2013-04-24 (×5): 30 mg via SUBCUTANEOUS
  Filled 2013-04-21 (×7): qty 0.3

## 2013-04-21 MED ORDER — MIDAZOLAM HCL 2 MG/2ML IJ SOLN
INTRAMUSCULAR | Status: AC
  Filled 2013-04-21: qty 2

## 2013-04-21 MED ORDER — ARTIFICIAL TEARS OP OINT
TOPICAL_OINTMENT | OPHTHALMIC | Status: AC
Start: 2013-04-21 — End: 2013-04-21
  Filled 2013-04-21: qty 3.5

## 2013-04-21 MED ORDER — METOCLOPRAMIDE HCL 5 MG/ML IJ SOLN
5.0000 mg | Freq: Three times a day (TID) | INTRAMUSCULAR | Status: DC | PRN
  Administered 2013-04-22: 10 mg via INTRAVENOUS
  Filled 2013-04-21: qty 2

## 2013-04-21 MED ORDER — DILTIAZEM HCL ER COATED BEADS 240 MG PO CP24
240.0000 mg | ORAL_CAPSULE | Freq: Every day | ORAL | Status: DC
  Administered 2013-04-23 – 2013-04-24 (×2): 240 mg via ORAL
  Filled 2013-04-21 (×3): qty 1

## 2013-04-21 MED ORDER — METHOCARBAMOL 100 MG/ML IJ SOLN
500.0000 mg | Freq: Four times a day (QID) | INTRAVENOUS | Status: DC | PRN
  Filled 2013-04-21: qty 5

## 2013-04-21 MED ORDER — ONDANSETRON HCL 4 MG PO TABS
4.0000 mg | ORAL_TABLET | Freq: Four times a day (QID) | ORAL | Status: DC | PRN
  Administered 2013-04-22: 4 mg via ORAL
  Filled 2013-04-21: qty 1

## 2013-04-21 MED ORDER — PROPOFOL 10 MG/ML IV BOLUS
INTRAVENOUS | Status: DC | PRN
  Administered 2013-04-21: 150 mg via INTRAVENOUS

## 2013-04-21 MED ORDER — LACTATED RINGERS IV SOLN
INTRAVENOUS | Status: DC
  Administered 2013-04-21 (×2): via INTRAVENOUS

## 2013-04-21 MED ORDER — ALPRAZOLAM 0.5 MG PO TABS: 0.5000 mg | ORAL_TABLET | Freq: Two times a day (BID) | ORAL | Status: DC | PRN

## 2013-04-21 MED ORDER — OXYCODONE HCL 5 MG PO TABS
ORAL_TABLET | ORAL | Status: AC
  Administered 2013-04-21: 5 mg via ORAL
  Filled 2013-04-21: qty 1

## 2013-04-21 MED ORDER — SENNOSIDES-DOCUSATE SODIUM 8.6-50 MG PO TABS: 1.0000 | ORAL_TABLET | Freq: Every evening | ORAL | Status: DC | PRN

## 2013-04-21 MED ORDER — CELECOXIB 200 MG PO CAPS
200.0000 mg | ORAL_CAPSULE | Freq: Two times a day (BID) | ORAL | Status: DC
  Administered 2013-04-21 – 2013-04-24 (×5): 200 mg via ORAL
  Filled 2013-04-21 (×8): qty 1

## 2013-04-21 MED ORDER — HYDROMORPHONE HCL PF 1 MG/ML IJ SOLN
0.2500 mg | INTRAMUSCULAR | Status: DC | PRN
  Administered 2013-04-21: 0.5 mg via INTRAVENOUS
  Administered 2013-04-21 (×2): 0.25 mg via INTRAVENOUS
  Administered 2013-04-21: 0.5 mg via INTRAVENOUS

## 2013-04-21 MED ORDER — BUPIVACAINE-EPINEPHRINE PF 0.5-1:200000 % IJ SOLN
INTRAMUSCULAR | Status: DC | PRN
  Administered 2013-04-21: 30 mL via PERINEURAL

## 2013-04-21 MED ORDER — HYDROMORPHONE HCL PF 1 MG/ML IJ SOLN
INTRAMUSCULAR | Status: AC
  Administered 2013-04-21: 0.5 mg via INTRAVENOUS
  Filled 2013-04-21: qty 1

## 2013-04-21 MED ORDER — BUPIVACAINE LIPOSOME 1.3 % IJ SUSP
20.0000 mL | Freq: Once | INTRAMUSCULAR | Status: DC
  Filled 2013-04-21: qty 20

## 2013-04-21 MED ORDER — PHENYLEPHRINE 40 MCG/ML (10ML) SYRINGE FOR IV PUSH (FOR BLOOD PRESSURE SUPPORT)
PREFILLED_SYRINGE | INTRAVENOUS | Status: AC
  Filled 2013-04-21: qty 10

## 2013-04-21 MED ORDER — LOSARTAN POTASSIUM 50 MG PO TABS
100.0000 mg | ORAL_TABLET | Freq: Every day | ORAL | Status: DC
  Administered 2013-04-23 – 2013-04-24 (×2): 100 mg via ORAL
  Filled 2013-04-21 (×4): qty 2

## 2013-04-21 MED ORDER — OXYCODONE HCL 5 MG PO TABS
5.0000 mg | ORAL_TABLET | Freq: Once | ORAL | Status: AC | PRN
  Administered 2013-04-21: 5 mg via ORAL

## 2013-04-21 MED ORDER — HYDROMORPHONE HCL PF 1 MG/ML IJ SOLN
INTRAMUSCULAR | Status: AC
  Administered 2013-04-21: 0.25 mg via INTRAVENOUS
  Filled 2013-04-21: qty 1

## 2013-04-21 MED ORDER — ACETAMINOPHEN 650 MG RE SUPP: 650.0000 mg | Freq: Four times a day (QID) | RECTAL | Status: DC | PRN

## 2013-04-21 MED ORDER — OXYCODONE HCL 5 MG PO TABS
5.0000 mg | ORAL_TABLET | ORAL | Status: DC | PRN
  Administered 2013-04-21: 10 mg via ORAL
  Administered 2013-04-21: 5 mg via ORAL
  Administered 2013-04-22: 10 mg via ORAL
  Filled 2013-04-21: qty 1
  Filled 2013-04-21 (×2): qty 2

## 2013-04-21 MED ORDER — ONDANSETRON HCL 4 MG/2ML IJ SOLN: 4.0000 mg | Freq: Once | INTRAMUSCULAR | Status: DC | PRN

## 2013-04-21 MED ORDER — BISACODYL 5 MG PO TBEC: 5.0000 mg | DELAYED_RELEASE_TABLET | Freq: Every day | ORAL | Status: DC | PRN

## 2013-04-21 SURGICAL SUPPLY — 59 items
BANDAGE ESMARK 6X9 LF (GAUZE/BANDAGES/DRESSINGS) ×1 IMPLANT
BLADE SAGITTAL 13X1.27X60 (BLADE) ×2 IMPLANT
BLADE SAGITTAL 13X1.27X60MM (BLADE) ×1
BLADE SAW SGTL 83.5X18.5 (BLADE) ×3 IMPLANT
BNDG CMPR 9X6 STRL LF SNTH (GAUZE/BANDAGES/DRESSINGS) ×1
BNDG ESMARK 6X9 LF (GAUZE/BANDAGES/DRESSINGS) ×3
BOWL SMART MIX CTS (DISPOSABLE) ×3 IMPLANT
CAP POR TM CP VIT E LN CER HD ×2 IMPLANT
CEMENT BONE SIMPLEX SPEEDSET (Cement) ×6 IMPLANT
COVER SURGICAL LIGHT HANDLE (MISCELLANEOUS) ×3 IMPLANT
CUFF TOURNIQUET SINGLE 34IN LL (TOURNIQUET CUFF) ×3 IMPLANT
DRAPE EXTREMITY T 121X128X90 (DRAPE) ×3 IMPLANT
DRAPE INCISE IOBAN 66X45 STRL (DRAPES) ×6 IMPLANT
DRAPE PROXIMA HALF (DRAPES) ×3 IMPLANT
DRAPE U-SHAPE 47X51 STRL (DRAPES) ×3 IMPLANT
DRSG ADAPTIC 3X8 NADH LF (GAUZE/BANDAGES/DRESSINGS) ×3 IMPLANT
DRSG MEPILEX BORDER 4X4 (GAUZE/BANDAGES/DRESSINGS) ×2 IMPLANT
DRSG PAD ABDOMINAL 8X10 ST (GAUZE/BANDAGES/DRESSINGS) ×3 IMPLANT
DURAPREP 26ML APPLICATOR (WOUND CARE) ×6 IMPLANT
ELECT REM PT RETURN 9FT ADLT (ELECTROSURGICAL) ×3
ELECTRODE REM PT RTRN 9FT ADLT (ELECTROSURGICAL) ×1 IMPLANT
EVACUATOR 1/8 PVC DRAIN (DRAIN) ×3 IMPLANT
GLOVE BIOGEL M 7.0 STRL (GLOVE) IMPLANT
GLOVE BIOGEL PI IND STRL 7.5 (GLOVE) IMPLANT
GLOVE BIOGEL PI IND STRL 8.5 (GLOVE) ×2 IMPLANT
GLOVE BIOGEL PI INDICATOR 7.5 (GLOVE)
GLOVE BIOGEL PI INDICATOR 8.5 (GLOVE) ×4
GLOVE SURG ORTHO 8.0 STRL STRW (GLOVE) ×6 IMPLANT
GOWN STRL REUS W/ TWL LRG LVL3 (GOWN DISPOSABLE) ×2 IMPLANT
GOWN STRL REUS W/ TWL XL LVL3 (GOWN DISPOSABLE) ×2 IMPLANT
GOWN STRL REUS W/TWL LRG LVL3 (GOWN DISPOSABLE) ×6
GOWN STRL REUS W/TWL XL LVL3 (GOWN DISPOSABLE) ×6
HANDPIECE INTERPULSE COAX TIP (DISPOSABLE) ×3
HOOD PEEL AWAY FACE SHEILD DIS (HOOD) ×12 IMPLANT
KIT BASIN OR (CUSTOM PROCEDURE TRAY) ×3 IMPLANT
KIT ROOM TURNOVER OR (KITS) ×3 IMPLANT
MANIFOLD NEPTUNE II (INSTRUMENTS) ×3 IMPLANT
NEEDLE 22X1 1/2 (OR ONLY) (NEEDLE) ×6 IMPLANT
NS IRRIG 1000ML POUR BTL (IV SOLUTION) ×3 IMPLANT
PACK TOTAL JOINT (CUSTOM PROCEDURE TRAY) ×3 IMPLANT
PAD ABD 8X10 STRL (GAUZE/BANDAGES/DRESSINGS) ×2 IMPLANT
PAD ARMBOARD 7.5X6 YLW CONV (MISCELLANEOUS) ×6 IMPLANT
PADDING CAST COTTON 6X4 STRL (CAST SUPPLIES) ×3 IMPLANT
SET HNDPC FAN SPRY TIP SCT (DISPOSABLE) ×1 IMPLANT
SPONGE GAUZE 4X4 12PLY (GAUZE/BANDAGES/DRESSINGS) ×3 IMPLANT
SPONGE GAUZE 4X4 12PLY STER LF (GAUZE/BANDAGES/DRESSINGS) ×2 IMPLANT
STAPLER VISISTAT 35W (STAPLE) ×3 IMPLANT
SUCTION FRAZIER TIP 10 FR DISP (SUCTIONS) ×3 IMPLANT
SUT BONE WAX W31G (SUTURE) ×3 IMPLANT
SUT VIC AB 0 CTB1 27 (SUTURE) ×6 IMPLANT
SUT VIC AB 1 CT1 27 (SUTURE) ×6
SUT VIC AB 1 CT1 27XBRD ANBCTR (SUTURE) ×2 IMPLANT
SUT VIC AB 2-0 CT1 27 (SUTURE) ×6
SUT VIC AB 2-0 CT1 TAPERPNT 27 (SUTURE) ×2 IMPLANT
SYR CONTROL 10ML LL (SYRINGE) ×6 IMPLANT
TOWEL OR 17X24 6PK STRL BLUE (TOWEL DISPOSABLE) ×3 IMPLANT
TOWEL OR 17X26 10 PK STRL BLUE (TOWEL DISPOSABLE) ×3 IMPLANT
TRAY FOLEY CATH 14FR (SET/KITS/TRAYS/PACK) ×3 IMPLANT
WATER STERILE IRR 1000ML POUR (IV SOLUTION) ×6 IMPLANT

## 2013-04-21 NOTE — Progress Notes (Signed)
Orthopedic Tech Progress Note Patient Details:  Terri Bridges [birth year] [id number] CPM applied to LLE with appropriate settings. OHF applied to bed.  CPM Left Knee CPM Left Knee: On Left Knee Flexion (Degrees): 90 Left Knee Extension (Degrees): 0   Asia R Thompson 04/21/2013, 12:59 PM

## 2013-04-21 NOTE — H&P (Signed)
Terri Bridges MRN:  [id number] DOB/SEX:  04-02-1934/female  CHIEF COMPLAINT:  Painful left Knee  HISTORY: Patient is a 79 y.o. female presented with a history of pain in the left knee. Onset of symptoms was gradual starting several years ago with gradually worsening course since that time. Prior procedures on the knee include none. Patient has been treated conservatively with over-the-counter NSAIDs and activity modification. Patient currently rates pain in the knee at 9 out of 10 with activity. There is pain at night.  PAST MEDICAL HISTORY: Patient Active Problem List   Diagnosis Date Noted  . Osteoporosis, unspecified 03/24/2012  . At high risk for falls 02/12/2012  . Chronic headache 02/12/2012  . Osteoarthritis of both knees 02/12/2012  . Leg pain, bilateral 02/12/2012  . Ruptured tympanic membrane 03/06/2011  . Benign positional vertigo 03/06/2011  . Anxiety 03/06/2011  . COPD (chronic obstructive pulmonary disease) 01/08/2011  . Hypertension 01/05/2011  . Hypothyroidism 01/05/2011  . Recurrent sinusitis 01/05/2011  . Allergic rhinitis 01/05/2011  . Depression 01/05/2011  . GE reflux 01/05/2011   Past Medical History  Diagnosis Date  . Allergy     uses Flonase daily as needed  . Hypothyroidism     takes Synthroid daily  . Hypertension     takes Losartan and Cardizem daily  . Depression     takes Zoloft daily  . GERD (gastroesophageal reflux disease)     takes Omeprazole daily and Protonix daily as needed  . Anxiety     takes Xanax daily as needed  . Asthma     Albuterol as needed  . Shortness of breath     with exertion  . History of bronchitis     last time many yrs ago  . Ringing in ears     sees Dr.Byers for this  . Arthritis   . Joint pain   . Joint swelling   . Back pain     scoliosis  . Osteoporosis   . Constipation     takes an OTC stool softener  . Urinary frequency   . Urinary urgency   . Urinary incontinence   . Nocturia   . Dry eyes     uses eye drops   Past Surgical History  Procedure Laterality Date  . Nasal sinus surgery      x 4  . Knee arthroscopy      rt knee  . Shoulder surgery Right     rotator cuff   . Tonsillectomy    . Cataract surgery Bilateral   . Tubal ligation    . Cardiac catheterization  2005  . Colonoscopy    . Esophagogastroduodenoscopy       MEDICATIONS:   No prescriptions prior to admission    ALLERGIES:  No Known Allergies  REVIEW OF SYSTEMS:  Pertinent items are noted in HPI.   FAMILY HISTORY:  No family history on file.  SOCIAL HISTORY:   History  Substance Use Topics  . Smoking status: Former Smoker -- 1.00 packs/day for 10 years    Types: Cigarettes  . Smokeless tobacco: Never Used     Comment: quit smoking in Mar 1993  . Alcohol Use: No     EXAMINATION:  Vital signs in last 24 hours:    General appearance: alert, cooperative and no distress Lungs: clear to auscultation bilaterally Heart: regular rate and rhythm, S1, S2 normal, no murmur, click, rub or gallop Abdomen: soft, non-tender; bowel sounds normal; no masses,  no organomegaly Extremities:  extremities normal, atraumatic, no cyanosis or edema and Homans sign is negative, no sign of DVT Pulses: 2+ and symmetric Skin: Skin color, texture, turgor normal. No rashes or lesions Neurologic: Alert and oriented X 3, normal strength and tone. Normal symmetric reflexes. Normal coordination and gait  Musculoskeletal:  ROM 0-115, Ligaments intact,  Imaging Review Plain radiographs demonstrate severe degenerative joint disease of the left knee. The overall alignment is mild valgus. The bone quality appears to be good for age and reported activity level.  Assessment/Plan: End stage arthritis, left knee   The patient history, physical examination and imaging studies are consistent with advanced degenerative joint disease of the left knee. The patient has failed conservative treatment.  The clearance notes were reviewed.   After discussion with the patient it was felt that Total Knee Replacement was indicated. The procedure,  risks, and benefits of total knee arthroplasty were presented and reviewed. The risks including but not limited to aseptic loosening, infection, blood clots, vascular injury, stiffness, patella tracking problems complications among others were discussed. The patient acknowledged the explanation, agreed to proceed with the plan.  Carlynn Spry 04/21/2013, 6:54 AM

## 2013-04-21 NOTE — Anesthesia Procedure Notes (Addendum)
Anesthesia Regional Block:  Adductor canal block  Pre-Anesthetic Checklist: ,, timeout performed, Correct Patient, Correct Site, Correct Laterality, Correct Procedure, Correct Position, site marked, Risks and benefits discussed,  Surgical consent,  Pre-op evaluation,  At surgeon's request and post-op pain management  Laterality: Left  Prep: chloraprep       Needles:  Injection technique: Single-shot  Needle Type: Echogenic Stimulator Needle     Needle Length: 9cm 9 cm Needle Gauge: 21 and 21 G    Additional Needles:  Procedures: ultrasound guided (picture in chart) Adductor canal block Narrative:  Start time: 04/21/2013 9:10 AM End time: 04/21/2013 9:25 AM Injection made incrementally with aspirations every 5 mL.  Performed by: Personally  Anesthesiologist: Lillia Abed MD  Additional Notes: Monitors applied. Patient sedated. Sterile prep and drape,hand hygiene and sterile gloves were used. Relevant anatomy identified.Needle position confirmed.Local anesthetic injected incrementally after negative aspiration. Local anesthetic spread visualized around nerve(s). Vascular puncture avoided. No complications. Image printed for medical record.The patient tolerated the procedure well.    Lillia Abed MD   Procedure Name: LMA Insertion Date/Time: 04/21/2013 10:00 AM Performed by: [place] Pink Pre-anesthesia Checklist: Patient identified, Timeout performed, Emergency Drugs available, Suction available and Patient being monitored Patient Re-evaluated:Patient Re-evaluated prior to inductionOxygen Delivery Method: Circle system utilized Preoxygenation: Pre-oxygenation with 100% oxygen Intubation Type: IV induction LMA: LMA inserted LMA Size: 4.0 Number of attempts: 1 Placement Confirmation: positive ETCO2 and breath sounds checked- equal and bilateral Tube secured with: Tape Dental Injury: Teeth and Oropharynx as per pre-operative assessment

## 2013-04-21 NOTE — Progress Notes (Signed)
Orthopedic Tech Progress Note Patient Details:  Terri Bridges [facility] [birth year] [id number]  Ortho Devices Ortho Device/Splint Location: footsie roll Ortho Device/Splint Interventions: Criss Alvine 04/21/2013, 4:42 PM

## 2013-04-21 NOTE — Anesthesia Postprocedure Evaluation (Signed)
Anesthesia Post Note  Patient: Terri Bridges  Procedure(s) Performed: Procedure(s) (LRB): TOTAL KNEE ARTHROPLASTY (Left)  Anesthesia type: general  Patient location: PACU  Post pain: Pain level controlled  Post assessment: Patient's Cardiovascular Status Stable  Last Vitals:  Filed Vitals:   04/21/13 1245  BP: 142/52  Pulse: 59  Temp:   Resp: 18    Post vital signs: Reviewed and stable  Level of consciousness: sedated  Complications: No apparent anesthesia complications

## 2013-04-21 NOTE — Transfer of Care (Signed)
Immediate Anesthesia Transfer of Care Note  Patient: Terri Bridges  Procedure(s) Performed: Procedure(s): TOTAL KNEE ARTHROPLASTY (Left)  Patient Location: PACU  Anesthesia Type:General  Level of Consciousness: awake, alert  and oriented  Airway & Oxygen Therapy: Patient Spontanous Breathing and Patient connected to face mask oxygen  Post-op Assessment: Report given to PACU RN and Post -op Vital signs reviewed and stable  Post vital signs: Reviewed and stable  Complications: No apparent anesthesia complications

## 2013-04-21 NOTE — Progress Notes (Signed)
Utilization review completed.  

## 2013-04-21 NOTE — Progress Notes (Signed)
Dr.Ossey called, updated on pt status, ECG strips show 1 deg block with sinus arrythimia, Pre-op ecg shows 1 deg block only, states he will come to evaluate patient

## 2013-04-21 NOTE — Anesthesia Preprocedure Evaluation (Signed)
Anesthesia Evaluation  Patient identified by MRN, date of birth, ID band Patient awake    Reviewed: Allergy & Precautions, H&P , NPO status , Patient's Chart, lab work & pertinent test results  Airway Mallampati: I TM Distance: >3 FB Neck ROM: Full    Dental   Pulmonary COPDformer smoker,          Cardiovascular hypertension, Pt. on medications     Neuro/Psych    GI/Hepatic GERD-  Medicated and Controlled,  Endo/Other  Hypothyroidism   Renal/GU      Musculoskeletal   Abdominal   Peds  Hematology   Anesthesia Other Findings   Reproductive/Obstetrics                           Anesthesia Physical Anesthesia Plan  ASA: II  Anesthesia Plan: General   Post-op Pain Management:    Induction: Intravenous  Airway Management Planned: LMA  Additional Equipment:   Intra-op Plan:   Post-operative Plan: Extubation in OR  Informed Consent: I have reviewed the patients History and Physical, chart, labs and discussed the procedure including the risks, benefits and alternatives for the proposed anesthesia with the patient or authorized representative who has indicated his/her understanding and acceptance.     Plan Discussed with: CRNA and Surgeon  Anesthesia Plan Comments:         Anesthesia Quick Evaluation

## 2013-04-21 NOTE — Plan of Care (Signed)
Problem: Consults Goal: Diagnosis- Total Joint Replacement Outcome: Completed/Met Date Met:  04/21/13 Primary Total Knee

## 2013-04-21 NOTE — Progress Notes (Signed)
Dr.Ossey at bedside to eval patient, no new orders, pt VSS

## 2013-04-21 NOTE — Progress Notes (Signed)
Top teeth in pacu.  [place]. Called detiny

## 2013-04-22 ENCOUNTER — Encounter (HOSPITAL_COMMUNITY): Payer: Self-pay | Admitting: Orthopedic Surgery

## 2013-04-22 LAB — CBC
HEMATOCRIT: 31.9 % — AB (ref 36.0–46.0)
Hemoglobin: 10 g/dL — ABNORMAL LOW (ref 12.0–15.0)
MCH: 26.1 pg (ref 26.0–34.0)
MCHC: 31.3 g/dL (ref 30.0–36.0)
MCV: 83.3 fL (ref 78.0–100.0)
Platelets: 132 10*3/uL — ABNORMAL LOW (ref 150–400)
RBC: 3.83 MIL/uL — AB (ref 3.87–5.11)
RDW: 14 % (ref 11.5–15.5)
WBC: 6.5 10*3/uL (ref 4.0–10.5)

## 2013-04-22 LAB — BASIC METABOLIC PANEL
BUN: 18 mg/dL (ref 6–23)
CALCIUM: 8.3 mg/dL — AB (ref 8.4–10.5)
CO2: 25 meq/L (ref 19–32)
CREATININE: 1.13 mg/dL — AB (ref 0.50–1.10)
Chloride: 106 mEq/L (ref 96–112)
GFR calc Af Amer: 53 mL/min — ABNORMAL LOW (ref >90)
GFR calc non Af Amer: 46 mL/min — ABNORMAL LOW (ref >90)
GLUCOSE: 106 mg/dL — AB (ref 70–99)
Potassium: 4.3 mEq/L (ref 3.7–5.3)
Sodium: 142 mEq/L (ref 137–147)

## 2013-04-22 MED ORDER — HYDROCODONE-ACETAMINOPHEN 5-325 MG PO TABS
1.0000 | ORAL_TABLET | ORAL | Status: DC | PRN
  Administered 2013-04-23 – 2013-04-24 (×5): 1 via ORAL
  Filled 2013-04-22 (×4): qty 1
  Filled 2013-04-22: qty 2

## 2013-04-22 MED ORDER — TRAMADOL HCL 50 MG PO TABS
50.0000 mg | ORAL_TABLET | Freq: Four times a day (QID) | ORAL | Status: DC | PRN
  Administered 2013-04-22: 50 mg via ORAL
  Filled 2013-04-22: qty 1

## 2013-04-22 NOTE — Care Management Note (Signed)
CARE MANAGEMENT NOTE 04/22/2013  Patient:  Terri Bridges, Terri Bridges   Account Number:  [id number]  Date Initiated:  04/22/2013  Documentation initiated by:  Ricki Miller  Subjective/Objective Assessment:   79 yr old female s/p left total knee arthroplasty.     Action/Plan:   Case manager spoke with patient concerning needs at discharge. Patient states she is going to SNF for shorttern rehab. Wants either [facility] or [facility]. Social worker notified.   Anticipated DC Date:  04/24/2013   Anticipated DC Plan:  SKILLED NURSING FACILITY  In-house referral  Clinical Social Worker      DC Planning Services  CM consult      [facility] Choice  NA   Choice offered to / List presented to:             Status of service:  Completed, signed off Medicare Important Message given?   (If response is "NO", the following Medicare IM given date fields will be blank) Date Medicare IM given:   Date Additional Medicare IM given:    Discharge Disposition:  [place]

## 2013-04-22 NOTE — Evaluation (Signed)
Physical Therapy Evaluation Patient Details Name: Terri Bridges MRN: [id number] DOB: 1934-05-17 Today's Date: 04/22/2013   History of Present Illness  pt presents with L TKA.    Clinical Impression  Pt with decreased arousal and needs frequent cueing to maintain arousal even during ambulation.  RN made aware.  Pt will need SNF level of care prior to returning to home.  Will continue to follow.      Follow Up Recommendations SNF    Equipment Recommendations  None recommended by PT    Recommendations for Other Services       Precautions / Restrictions Precautions Precautions: Knee;Fall Precaution Booklet Issued: Yes (comment) Restrictions Weight Bearing Restrictions: Yes LLE Weight Bearing: Weight bearing as tolerated      Mobility  Bed Mobility Overal bed mobility: Needs Assistance Bed Mobility: Supine to Sit     Supine to sit: Min assist     General bed mobility comments: cues for sequencing.    Transfers Overall transfer level: Needs assistance Equipment used: Rolling walker (2 wheeled) Transfers: Sit to/from Stand Sit to Stand: Mod assist         General transfer comment: cues for UE use and getting closer to chair prior to sitting.  pt getting anxious towards end of amb 2/2 fatigued and fear of falling.    Ambulation/Gait Ambulation/Gait assistance: Min assist Ambulation Distance (Feet): 10 Feet Assistive device: Rolling walker (2 wheeled) Gait Pattern/deviations: Step-through pattern;Decreased stride length;Decreased step length - right;Decreased stance time - left;Trunk flexed     General Gait Details: pt fatigued and seems to start sleeping during amb and needs frequent cueing for arousal and attending to task.  pt gets fearful of falling as she increases fatigue.  pt needs firm cueing for safety and maintaining arousal.    Stairs            Wheelchair Mobility    Modified Rankin (Stroke Patients Only)       Balance                                             Pertinent Vitals/Pain Did not rate, but states pain during [place], but then begins to falls asleep.  RN made aware.      Home Living Family/patient expects to be discharged to:: Skilled nursing facility                      Prior Function Level of Independence: Independent with assistive device(s)               Hand Dominance        Extremity/Trunk Assessment   Upper Extremity Assessment: Defer to OT evaluation           Lower Extremity Assessment: LLE deficits/detail;Generalized weakness   LLE Deficits / Details: AAROM ~10-60.       Communication   Communication: No difficulties  Cognition Arousal/Alertness: Lethargic Behavior During Therapy: Flat affect Overall Cognitive Status: Within Functional Limits for tasks assessed                      General Comments      Exercises Total Joint Exercises Ankle Circles/Pumps: AROM;Left;10 reps Straight Leg Raises: AROM;Left;5 reps Long Arc Quad: AROM;Left;10 reps Knee Flexion: AROM;Left;10 reps      Assessment/Plan    PT Assessment Patient needs continued PT  services  PT Diagnosis Abnormality of gait;Acute pain   PT Problem List Decreased strength;Decreased range of motion;Decreased activity tolerance;Decreased balance;Decreased mobility;Decreased knowledge of use of DME;Pain;Impaired sensation  PT Treatment Interventions DME instruction;Gait training;Stair training;Functional mobility training;Therapeutic activities;Therapeutic exercise;Balance training;Patient/family education   PT Goals (Current goals can be found in the Care Plan section) Acute Rehab PT Goals Patient Stated Goal: Move better PT Goal Formulation: With patient Time For Goal Achievement: 05/06/13 Potential to Achieve Goals: Good    Frequency 7X/week   Barriers to discharge        Co-evaluation               End of Session Equipment Utilized During Treatment: Gait  belt Activity Tolerance: Patient tolerated treatment well Patient left: in chair;with call bell/phone within reach Nurse Communication: Mobility status (Arousal level)         Time: 1517-6160 PT Time Calculation (min): 26 min   Charges:   PT Evaluation $Initial PT Evaluation Tier I: 1 Procedure PT Treatments $Gait Training: 8-22 mins $Therapeutic Activity: 8-22 mins   PT G CodesCatarina Hartshorn, [place] [phone] 04/22/2013, 10:19 AM

## 2013-04-22 NOTE — Progress Notes (Signed)
SPORTS MEDICINE AND JOINT REPLACEMENT  Lara Mulch, MD   Carlynn Spry, PA-C [address], [place], [place]  [address]                             [phone]   PROGRESS NOTE  Subjective:  negative for Chest Pain  negative for Shortness of Breath  negative for Nausea/Vomiting   negative for Calf Pain  negative for Bowel Movement   Tolerating Diet: yes         Patient reports pain as 3 on 0-10 scale.    Objective: Vital signs in last 24 hours:   Patient Vitals for the past 24 hrs:  BP Temp Temp src Pulse Resp SpO2  04/22/13 1135 - - - - 18 -  04/22/13 1130 103/52 mmHg - - 79 18 90 %  04/22/13 0800 - - - - 18 -  04/22/13 0643 - - - 80 20 -  04/22/13 0600 115/53 mmHg 98.4 F (36.9 C) Oral 82 18 98 %  04/22/13 0400 - - - - 18 93 %  04/22/13 0138 117/59 mmHg 98.3 F (36.8 C) Oral 79 18 98 %  04/22/13 0000 - - - - 18 90 %  04/21/13 2053 130/66 mmHg 98.3 F (36.8 C) Oral 77 18 98 %  04/21/13 2043 - - - 80 18 -  04/21/13 2000 - - - - 18 97 %  04/21/13 1606 135/48 mmHg - - 70 - -  04/21/13 1600 - - - - 18 96 %  04/21/13 1504 96/62 mmHg 97.7 F (36.5 C) - 58 18 100 %  04/21/13 1415 125/61 mmHg 98.1 F (36.7 C) - 63 16 100 %  04/21/13 1400 98/76 mmHg - - 60 22 99 %  04/21/13 1345 121/62 mmHg - - 46 16 100 %  04/21/13 1330 117/65 mmHg - - 61 18 98 %    @flow {1959:LAST@   Intake/Output from previous day:   04/13 0701 - 04/14 0700 In: 2320 [P.O.:420; I.V.:1900] Out: 460 [Urine:450]   Intake/Output this shift:       Intake/Output     04/13 0701 - 04/14 0700 04/14 0701 - 04/15 0700   P.O. 420    I.V. (mL/kg) 1900 (25.1)    Total Intake(mL/kg) 2320 (30.6)    Urine (mL/kg/hr) 450    Blood 10    Total Output 460     Net +1860          Urine Occurrence 3 x    Emesis Occurrence 1 x       LABORATORY DATA:  Recent Labs  04/21/13 1542 04/22/13 0400  WBC 8.4 6.5  HGB 11.4* 10.0*  HCT 35.6* 31.9*  PLT 148* 132*    Recent Labs  04/21/13 1542  04/22/13 0400  NA  --  142  K  --  4.3  CL  --  106  CO2  --  25  BUN  --  18  CREATININE 1.01 1.13*  GLUCOSE  --  106*  CALCIUM  --  8.3*   Lab Results  Component Value Date   INR 0.99 04/09/2013    Examination:  General appearance: alert, cooperative and sleepy Extremities: Homans sign is negative, no sign of DVT  Wound Exam: clean, dry, intact   Drainage:  Scant/small amount Serosanguinous exudate  Motor Exam: EHL and FHL Intact  Sensory Exam: Deep Peroneal normal   Assessment:    1 Day  Post-Op  Procedure(s) (LRB): TOTAL KNEE ARTHROPLASTY (Left)  ADDITIONAL DIAGNOSIS:  Active Problems:   S/P total knee arthroplasty  Acute Blood Loss Anemia   Plan: Physical Therapy as ordered Weight Bearing as Tolerated (WBAT)  DVT Prophylaxis:  Lovenox  DISCHARGE PLAN: Skilled Nursing Facility/Rehab  DISCHARGE NEEDS: HHPT, CPM, Walker and 3-in-1 comode seat         Carlynn Spry 04/22/2013, 1:29 PM

## 2013-04-22 NOTE — Op Note (Signed)
TOTAL KNEE REPLACEMENT OPERATIVE NOTE:  04/21/2013  8:40 PM  PATIENT:  Terri Bridges  79 y.o. female  PRE-OPERATIVE DIAGNOSIS:  osteoarthritis left knee  POST-OPERATIVE DIAGNOSIS:  OA left knee  PROCEDURE:  Procedure(s): TOTAL KNEE ARTHROPLASTY  SURGEON:  Surgeon(s): Vickey Huger, MD  PHYSICIAN ASSISTANT: Carlynn Spry, [facility]  ANESTHESIA:   general  DRAINS: Hemovac  SPECIMEN: None  COUNTS:  Correct  TOURNIQUET:   Total Tourniquet Time Documented: Thigh (Left) - 49 minutes Total: Thigh (Left) - 49 minutes   DICTATION:  Indication for procedure:    The patient is a 79 y.o. female who has failed conservative treatment for osteoarthritis left knee.  Informed consent was obtained prior to anesthesia. The risks versus benefits of the operation were explain and in a way the patient can, and did, understand.   On the implant demand matching protocol, this patient scored 10.  Therefore, this patient was not receive a polyethylene insert with vitamin E which is a high demand implant.  Description of procedure:     The patient was taken to the operating room and placed under anesthesia.  The patient was positioned in the usual fashion taking care that all body parts were adequately padded and/or protected.  I foley catheter was not placed.  A tourniquet was applied and the leg prepped and draped in the usual sterile fashion.  The extremity was exsanguinated with the esmarch and tourniquet inflated to 350 mmHg.  Pre-operative range of motion was normal.  The knee was in 5 degree of mild varus.  A midline incision approximately 6-7 inches long was made with a #10 blade.  A new blade was used to make a parapatellar arthrotomy going 2-3 cm into the quadriceps tendon, over the patella, and alongside the medial aspect of the patellar tendon.  A synovectomy was then performed with the #10 blade and forceps. I then elevated the deep MCL off the medial tibial metaphysis subperiosteally  around to the semimembranosus attachment.    I everted the patella and used calipers to measure patellar thickness.  I used the reamer to ream down to appropriate thickness to recreate the native thickness.  I then removed excess bone with the rongeur and sagittal saw.  I used the appropriately sized template and drilled the three lug holes.  I then put the trial in place and measured the thickness with the calipers to ensure recreation of the native thickness.  The trial was then removed and the patella subluxed and the knee brought into flexion.  A homan retractor was place to retract and protect the patella and lateral structures.  A Z-retractor was place medially to protect the medial structures.  The extra-medullary alignment system was used to make cut the tibial articular surface perpendicular to the anamotic axis of the tibia and in 3 degrees of posterior slope.  The cut surface and alignment jig was removed.  I then used the intramedullary alignment guide to make a 5 valgus cut on the distal femur.  I then marked out the epicondylar axis on the distal femur.  The posterior condylar axis measured 6 degrees.  I then used the anterior referencing sizer and measured the femur to be a size 7.  The 4-In-1 cutting block was screwed into place in external rotation matching the posterior condylar angle, making our cuts perpendicular to the epicondylar axis.  Anterior, posterior and chamfer cuts were made with the sagittal saw.  The cutting block and cut pieces were removed.  A  lamina spreader was placed in 90 degrees of flexion.  The ACL, PCL, menisci, and posterior condylar osteophytes were removed.  A 10 mm spacer blocked was found to offer good flexion and extension gap balance after mild in degree releasing.   The scoop retractor was then placed and the femoral finishing block was pinned in place.  The small sagittal saw was used as well as the lug drill to finish the femur.  The block and cut surfaces  were removed and the medullary canal hole filled with autograft bone from the cut pieces.  The tibia was delivered forward in deep flexion and external rotation.  A size D tray was selected and pinned into place centered on the medial 1/3 of the tibial tubercle.  The reamer and keel was used to prepare the tibia through the tray.    I then trialed with the size 7 femur, size D tibia, a 11 mm insert and the 32 patella.  I had excellent flexion/extension gap balance, excellent patella tracking.  Flexion was full and beyond 120 degrees; extension was zero.  These components were chosen and the staff opened them to me on the back table while the knee was lavaged copiously and the cement mixed.  The soft tissue was infiltrated with 60cc of exparel 1.3% through a 21 gauge needle.  I cemented in the components and removed all excess cement.  The polyethylene tibial component was snapped into place and the knee placed in extension while cement was hardening.  The capsule was infilltrated with 30cc of .25% Marcaine with epinephrine.  A hemovac was place in the joint exiting superolaterally.  A pain pump was place superomedially superficial to the arthrotomy.  Once the cement was hard, the tourniquet was let down.  Hemostasis was obtained.  The arthrotomy was closed with figure-8 #1 vicryl sutures.  The deep soft tissues were closed with #0 vicryls and the subcuticular layer closed with a running #2-0 vicryl.  The skin was reapproximated and closed with skin staples.  The wound was dressed with xeroform, 4 x4's, 2 ABD sponges, a single layer of webril and a TED stocking.   The patient was then awakened, extubated, and taken to the recovery room in stable condition.  BLOOD LOSS:  300cc DRAINS: 1 hemovac, 1 pain catheter COMPLICATIONS:  None.  PLAN OF CARE: Admit to inpatient   PATIENT DISPOSITION:  PACU - hemodynamically stable.   Delay start of Pharmacological VTE agent (>24hrs) due to surgical blood loss or  risk of bleeding:  not applicable  Please fax a copy of this op note to my office at [phone] (please only include page 1 and 2 of the Case Information op note)

## 2013-04-22 NOTE — Progress Notes (Signed)
OT Cancellation Note  Patient Details Name: Terri Bridges MRN: [id number] DOB: 02-19-1934   Cancelled Treatment:    Reason Eval/Treat Not Completed: Other (comment) Pt is Medicare and current D/C plan is SNF. No apparent immediate acute care OT needs, therefore will defer OT to SNF. If OT eval is needed please call Acute Rehab Dept. at [phone] or text page OT at [phone].   Juluis Rainier [phone] 04/22/2013, 11:00 AM

## 2013-04-23 LAB — CBC
HCT: 30.9 % — ABNORMAL LOW (ref 36.0–46.0)
Hemoglobin: 9.8 g/dL — ABNORMAL LOW (ref 12.0–15.0)
MCH: 26.3 pg (ref 26.0–34.0)
MCHC: 31.7 g/dL (ref 30.0–36.0)
MCV: 82.8 fL (ref 78.0–100.0)
PLATELETS: 116 10*3/uL — AB (ref 150–400)
RBC: 3.73 MIL/uL — ABNORMAL LOW (ref 3.87–5.11)
RDW: 13.8 % (ref 11.5–15.5)
WBC: 5.8 10*3/uL (ref 4.0–10.5)

## 2013-04-23 MED ORDER — METHOCARBAMOL 500 MG PO TABS: 500.0000 mg | ORAL_TABLET | Freq: Four times a day (QID) | ORAL | Status: DC | PRN

## 2013-04-23 MED ORDER — ENOXAPARIN SODIUM 40 MG/0.4ML ~~LOC~~ SOLN: 40.0000 mg | SUBCUTANEOUS | Status: DC

## 2013-04-23 MED ORDER — HYDROCODONE-ACETAMINOPHEN 5-325 MG PO TABS: 1.0000 | ORAL_TABLET | ORAL | Status: DC | PRN

## 2013-04-23 NOTE — Progress Notes (Signed)
SPORTS MEDICINE AND JOINT REPLACEMENT  Terri Mulch, MD   Carlynn Spry, PA-C [place], [place], [place]  [address]                             [phone]   PROGRESS NOTE  Subjective:  negative for Chest Pain  negative for Shortness of Breath  negative for Nausea/Vomiting   negative for Calf Pain  negative for Bowel Movement   Tolerating Diet: yes         Patient reports pain as 2 on 0-10 scale.    Objective: Vital signs in last 24 hours:   Patient Vitals for the past 24 hrs:  BP Temp Pulse Resp SpO2  04/23/13 1713 - - - - 96 %  04/23/13 1330 129/57 mmHg 98.3 F (36.8 C) 90 18 97 %  04/23/13 1054 145/62 mmHg - - - -  04/23/13 0623 145/62 mmHg 98.6 F (37 C) 85 18 99 %  04/22/13 2119 144/63 mmHg 98 F (36.7 C) 89 18 96 %  04/22/13 1933 - - - - 97 %    @flow {1959:LAST@   Intake/Output from previous day:   04/14 0701 - 04/15 0700 In: 840 [P.O.:840] Out: -    Intake/Output this shift:   04/15 0701 - 04/15 1900 In: 360 [P.O.:360] Out: 700 [Urine:700]   Intake/Output     04/14 0701 - 04/15 0700 04/15 0701 - 04/16 0700   P.O. 840 360   I.V. (mL/kg)     Total Intake(mL/kg) 840 (11.1) 360 (4.8)   Urine (mL/kg/hr)  700 (0.9)   Blood     Total Output   700   Net +840 -340        Urine Occurrence 2 x 1 x   Emesis Occurrence 1 x       LABORATORY DATA:  Recent Labs  04/21/13 1542 04/22/13 0400 04/23/13 0404  WBC 8.4 6.5 5.8  HGB 11.4* 10.0* 9.8*  HCT 35.6* 31.9* 30.9*  PLT 148* 132* 116*    Recent Labs  04/21/13 1542 04/22/13 0400  NA  --  142  K  --  4.3  CL  --  106  CO2  --  25  BUN  --  18  CREATININE 1.01 1.13*  GLUCOSE  --  106*  CALCIUM  --  8.3*   Lab Results  Component Value Date   INR 0.99 04/09/2013    Examination:  General appearance: alert, cooperative and no distress Extremities: Homans sign is negative, no sign of DVT  Wound Exam: clean, dry, intact   Drainage:  None: wound tissue dry  Motor Exam: EHL and  FHL Intact  Sensory Exam: Deep Peroneal normal   Assessment:    2 Days Post-Op  Procedure(s) (LRB): TOTAL KNEE ARTHROPLASTY (Left)  ADDITIONAL DIAGNOSIS:  Active Problems:   S/P total knee arthroplasty  Acute Blood Loss Anemia   Plan: Physical Therapy as ordered Weight Bearing as Tolerated (WBAT)  DVT Prophylaxis:  Lovenox  DISCHARGE PLAN: Skilled Nursing Facility/Rehab  DISCHARGE NEEDS: CPM, Walker and 3-in-1 comode seat         Carlynn Spry 04/23/2013, 5:29 PM

## 2013-04-23 NOTE — Progress Notes (Signed)
Therex: APx10 BLEs QSx10 BLEs LAQx5 LLE HSx5 LLE  Agreed with above note 04/23/2013 Tonia Brooms Robinette PTA [phone] pager [phone] office

## 2013-04-23 NOTE — Clinical Social Work Note (Signed)
Clinical Social Work Department BRIEF PSYCHOSOCIAL ASSESSMENT 04/23/2013  Patient:  Terri Bridges,Terri Bridges     Account Number:  [id number]     Admit date:  04/21/2013  Clinical Social Worker:  Edis Huish, LCSWA  Date/Time:  04/23/2013 04:50 PM  Referred by:  Physician  Date Referred:  04/23/2013 Referred for  SNF Placement   Other Referral:   Interview type:  Patient Other interview type:    PSYCHOSOCIAL DATA Living Status:  ALONE Admitted from facility:   Level of care:   Primary support name:  daughter- Kim Primary support relationship to patient:  FAMILY Degree of support available:   good    CURRENT CONCERNS Current Concerns  Post-Acute Placement   Other Concerns:    SOCIAL WORK ASSESSMENT / PLAN Patient admitted from home where she lives alone- she is interested in SNF for rehab at [place] and CSW has given her bed offers and she has selected offer at [facility].   Assessment/plan status:  Other - See comment Other assessment/ plan:   FL2 and PASARR for SNF   Information/referral to community resources:   SNF list    PATIENT'S/FAMILY'S RESPONSE TO PLAN OF CARE: Patient pleased to know [facility] can take her for rehab at dc- her sister was there once for rehab and had a good experience-   Evetta Renner, MSW, LCSWA [phone]      

## 2013-04-23 NOTE — Progress Notes (Signed)
Orthopedic Tech Progress Note Patient Details:  Terri Bridges 1934-05-13 [id number] On cpm at 7:40 pm LLE 0-90 Patient ID: Terri Bridges, female   DOB: March 21, 1934, 79 y.o.   MRN: [id number]   Terri Bridges 04/23/2013, 7:40 PM

## 2013-04-23 NOTE — Clinical Social Work Note (Signed)
Clinical Social Work Department BRIEF PSYCHOSOCIAL ASSESSMENT 04/23/2013  Patient:  Terri Bridges,Terri Bridges     Account Number:  [id number]     Admit date:  04/21/2013  Clinical Social Worker:  Loralee Weitzman, LCSWA  Date/Time:  04/23/2013 04:50 PM  Referred by:  Physician  Date Referred:  04/23/2013 Referred for  SNF Placement   Other Referral:   Interview type:  Patient Other interview type:    PSYCHOSOCIAL DATA Living Status:  ALONE Admitted from facility:   Level of care:   Primary support name:  daughter- Kim Primary support relationship to patient:  FAMILY Degree of support available:   good    CURRENT CONCERNS Current Concerns  Post-Acute Placement   Other Concerns:    SOCIAL WORK ASSESSMENT / PLAN Patient admitted from home where she lives alone- she is interested in SNF for rehab at [place] and CSW has given her bed offers and she has selected offer at [facility].   Assessment/plan status:  Other - See comment Other assessment/ plan:   FL2 and PASARR for SNF   Information/referral to community resources:   SNF list    PATIENT'S/FAMILY'S RESPONSE TO PLAN OF CARE: Patient pleased to know [facility] can take her for rehab at dc- her sister was there once for rehab and had a good experience-   Jamile Sivils, MSW, LCSWA [phone]      

## 2013-04-23 NOTE — Discharge Instructions (Signed)
Diet: As you were doing prior to hospitalization   Activity:  Increase activity slowly as tolerated                  No lifting or driving for 6 weeks  Shower:  May shower without a dressing once there is no drainage from your wound.                 Do NOT wash over the wound.                 Dressing:  You may change your dressing on Friday                    Then change the dressing daily with sterile 4"x4"s gauze dressing                     And TED hose for knees.  Weight Bearing:  Weight bearing as tolerated as taught in physical therapy.  Use a                                walker or Crutches as instructed.  To prevent constipation: you may use a stool softener such as -               Colace ( over the counter) 100 mg by mouth twice a day                Drink plenty of fluids ( prune juice may be helpful) and high fiber foods                Miralax ( over the counter) for constipation as needed.    Precautions:  If you experience chest pain or shortness of breath - call 911 immediately               For transfer to the hospital emergency department!!               If you develop a fever greater that 101 F, purulent drainage from wound,                             increased redness or drainage from wound, or calf pain -- Call the office.  Follow- Up Appointment:  Please call for an appointment to be seen on 05/06/13                                              [facility] office:  [phone]            [address] [place],  [address]

## 2013-04-23 NOTE — Discharge Summary (Signed)
SPORTS MEDICINE & JOINT REPLACEMENT   Lara Mulch, MD   Carlynn Spry, PA-C [address], [place], [place]  [address]                             [phone]  PATIENT ID: Terri Bridges        MRN:  [id number]          DOB/AGE: 77-Feb-1936 / 77 y.o.    DISCHARGE SUMMARY  ADMISSION DATE:    04/21/2013 DISCHARGE DATE:   04/24/2013  ADMISSION DIAGNOSIS: osteoarthritis left knee    DISCHARGE DIAGNOSIS:  osteoarthritis left knee    ADDITIONAL DIAGNOSIS: Active Problems:   S/P total knee arthroplasty  Past Medical History  Diagnosis Date  . Allergy     uses Flonase daily as needed  . Hypothyroidism     takes Synthroid daily  . Hypertension     takes Losartan and Cardizem daily  . Depression     takes Zoloft daily  . GERD (gastroesophageal reflux disease)     takes Omeprazole daily and Protonix daily as needed  . Anxiety     takes Xanax daily as needed  . Asthma     Albuterol as needed  . History of bronchitis     last time many yrs ago  . Ringing in ears     sees Dr.Byers for this  . Joint pain   . Joint swelling   . Osteoporosis   . Constipation     takes an OTC stool softener  . Urinary frequency   . Urinary urgency   . Urinary incontinence   . Nocturia   . Dry eyes     uses eye drops  . Exertional shortness of breath   . Arthritis     "back" (04/21/2013)  . Scoliosis   . Chronic lower back pain     PROCEDURE: Procedure(s): TOTAL KNEE ARTHROPLASTY on 04/21/2013  CONSULTS:     HISTORY:  See H&P in chart  HOSPITAL COURSE:  Terri Bridges is a 79 y.o. admitted on 04/21/2013 and found to have a diagnosis of osteoarthritis left knee.  After appropriate laboratory studies were obtained  they were taken to the operating room on 04/21/2013 and underwent Procedure(s): TOTAL KNEE ARTHROPLASTY.   They were given perioperative antibiotics:  Anti-infectives   Start     Dose/Rate Route Frequency Ordered Stop   04/21/13 1600  ceFAZolin (ANCEF) IVPB 1  g/50 mL premix     1 g 100 mL/hr over 30 Minutes Intravenous Every 6 hours 04/21/13 1450 04/21/13 2244   04/21/13 0600  ceFAZolin (ANCEF) IVPB 2 g/50 mL premix     2 g 100 mL/hr over 30 Minutes Intravenous On call to O.R. 04/20/13 1309 04/21/13 1008    .  Tolerated the procedure well.  Placed with a foley intraoperatively.  Given Ofirmev at induction and for 48 hours.    POD# 1: Vital signs were stable.  Patient denied Chest pain, shortness of breath, or calf pain.  Patient was started on Lovenox 30 mg subcutaneously twice daily at 8am.  Consults to PT, OT, and care management were made.  The patient was weight bearing as tolerated.  CPM was placed on the operative leg 0-90 degrees for 6-8 hours a day.  Incentive spirometry was taught.  Dressing was changed.  Marcaine pump and hemovac were discontinued.      Continued  PT for ambulation and  exercise program.  IV saline locked.  O2 discontinued.    The remainder of the hospital course was dedicated to ambulation and strengthening.   The patient was discharged on 3 days post op in  Good condition.  Blood products given:none  DIAGNOSTIC STUDIES: Recent vital signs: Patient Vitals for the past 24 hrs:  BP Temp Pulse Resp SpO2  04/23/13 1713 - - - - 96 %  04/23/13 1330 129/57 mmHg 98.3 F (36.8 C) 90 18 97 %  04/23/13 1054 145/62 mmHg - - - -  04/23/13 0623 145/62 mmHg 98.6 F (37 C) 85 18 99 %  04/22/13 2119 144/63 mmHg 98 F (36.7 C) 89 18 96 %  04/22/13 1933 - - - - 97 %       Recent laboratory studies:  Recent Labs  04/21/13 1542 04/22/13 0400 04/23/13 0404  WBC 8.4 6.5 5.8  HGB 11.4* 10.0* 9.8*  HCT 35.6* 31.9* 30.9*  PLT 148* 132* 116*    Recent Labs  04/21/13 1542 04/22/13 0400  NA  --  142  K  --  4.3  CL  --  106  CO2  --  25  BUN  --  18  CREATININE 1.01 1.13*  GLUCOSE  --  106*  CALCIUM  --  8.3*   Lab Results  Component Value Date   INR 0.99 04/09/2013     Recent Radiographic Studies :  Dg  Chest 2 View  04/09/2013   CLINICAL DATA:  Pre operative respiratory exam. Osteoarthritis of the knee.  EXAM: CHEST  2 VIEW  COMPARISON:  09/16/2010  FINDINGS: The heart size and pulmonary vascularity are normal. Is chronic peribronchial thickening. There is a large hiatal hernia. Prominent accentuation of the thoracic kyphosis with tortuosity and calcification of the thoracic aorta. The lungs are somewhat hyperinflated suggesting emphysema.  IMPRESSION: No acute abnormality. Probable chronic lung disease. Large hiatal hernia.   Electronically Signed   By: Rozetta Nunnery M.D.   On: 04/09/2013 10:02    DISCHARGE INSTRUCTIONS: Discharge Orders   Future Orders Complete By Expires   Call MD / Call 911  As directed    Change dressing  As directed    Constipation Prevention  As directed    CPM  As directed    Diet - low sodium heart healthy  As directed    Do not put a pillow under the knee. Place it under the heel.  As directed    Driving restrictions  As directed    Increase activity slowly as tolerated  As directed    Lifting restrictions  As directed    TED hose  As directed       DISCHARGE MEDICATIONS:     Medication List    STOP taking these medications       fish oil-omega-3 fatty acids 1000 MG capsule      TAKE these medications       albuterol (2.5 MG/3ML) 0.083% nebulizer solution  Commonly known as:  PROVENTIL  Take 3 mLs (2.5 mg total) by nebulization every 6 (six) hours as needed for wheezing or shortness of breath.     ALPRAZolam 0.25 MG tablet  Commonly known as:  XANAX  Take 2 tablets (0.5 mg total) by mouth 2 (two) times daily as needed for anxiety.     CALCIUM + D PO  Take 1 tablet by mouth daily.     diltiazem 240 MG 24 hr capsule  Commonly known as:  CARDIZEM  CD  Take 240 mg by mouth daily.     enoxaparin 40 MG/0.4ML injection  Commonly known as:  LOVENOX  Inject 0.4 mLs (40 mg total) into the skin daily.     fluticasone 50 MCG/ACT nasal spray  Commonly  known as:  FLONASE  Place 2 sprays into both nostrils daily as needed for allergies.     HYDROcodone-acetaminophen 5-325 MG per tablet  Commonly known as:  NORCO/VICODIN  Take 1-2 tablets by mouth every 4 (four) hours as needed for moderate pain.     ibuprofen 600 MG tablet  Commonly known as:  ADVIL,MOTRIN  Take 1 tablet (600 mg total) by mouth every 6 (six) hours as needed for pain.     levothyroxine 150 MCG tablet  Commonly known as:  SYNTHROID  Take 1 tablet (150 mcg total) by mouth daily.     losartan 100 MG tablet  Commonly known as:  COZAAR  Take 1 tablet (100 mg total) by mouth daily.     methocarbamol 500 MG tablet  Commonly known as:  ROBAXIN  Take 1 tablet (500 mg total) by mouth every 6 (six) hours as needed for muscle spasms.     omeprazole 20 MG capsule  Commonly known as:  PRILOSEC  Take 20 mg by mouth daily.     pantoprazole 40 MG tablet  Commonly known as:  PROTONIX  Take 40 mg by mouth daily as needed (for reflux).     sertraline 100 MG tablet  Commonly known as:  ZOLOFT  Take 100 mg by mouth daily.     SYSTANE OP  Apply 1 drop to eye daily as needed (for dry eyes).        FOLLOW UP VISIT:       Follow-up Information   Follow up with Rudean Haskell, MD. Call on 05/06/2013.   Specialty:  Orthopedic Surgery   Contact information:   [place] [address] [phone]       DISPOSITION: SNF  CONDITION:  Vertell Novak 04/23/2013, 5:37 PM

## 2013-04-23 NOTE — Clinical Social Work Note (Signed)
Clinical Social Work Department BRIEF PSYCHOSOCIAL ASSESSMENT 04/23/2013  Patient:  DALISSA, LOVIN     Account Number:  [id number]     Admit date:  04/21/2013  Clinical Social Worker:  Daiva Huge  Date/Time:  04/23/2013 04:50 PM  Referred by:  Physician  Date Referred:  04/23/2013 Referred for  SNF Placement   Other Referral:   Interview type:  Patient Other interview type:    PSYCHOSOCIAL DATA Living Status:  ALONE Admitted from facility:   Level of care:   Primary support name:  daughterMaudie Mercury Primary support relationship to patient:  FAMILY Degree of support available:   good    CURRENT CONCERNS Current Concerns  Post-Acute Placement   Other Concerns:    SOCIAL WORK ASSESSMENT / PLAN Patient admitted from home where she lives alone- she is interested in SNF for rehab at [place] and CSW has given her bed offers and she has selected offer at [facility].   Assessment/plan status:  Other - See comment Other assessment/ plan:   FL2 and PASARR for SNF   Information/referral to community resources:   SNF list    PATIENT'S/FAMILY'S RESPONSE TO PLAN OF CARE: Patient pleased to know [facility] can take her for rehab at dc- her sister was there once for rehab and had a good experience-   Eduard Clos, MSW, [place]

## 2013-04-23 NOTE — Progress Notes (Signed)
Physical Therapy Treatment Patient Details Name: Terri Bridges MRN: [id number] DOB: November 21, 1934 Today's Date: 04/23/2013    History of Present Illness pt presents with L TKA.      PT Comments    Patient able to tolerate increase weight on LE during gait training. During therex patient appeared lethargic but wished to continue therapy. Patient is progressing towards goals but requires A for safety and encouragement. Continue to increase tolerance with activity. Continue to recommend SNF at this time for safety.   Follow Up Recommendations  SNF     Equipment Recommendations  None recommended by PT    Recommendations for Other Services       Precautions / Restrictions Precautions Precautions: Knee;Fall Restrictions Weight Bearing Restrictions: Yes LLE Weight Bearing: Weight bearing as tolerated    Mobility  Bed Mobility               General bed mobility comments: Patient in recliner prior to therapy.  Transfers Overall transfer level: Needs assistance Equipment used: Rolling walker (2 wheeled) Transfers: Sit to/from Stand Sit to Stand: Min assist         General transfer comment: Patient required cues for hand placement.  Ambulation/Gait Ambulation/Gait assistance: Min assist Ambulation Distance (Feet): 20 Feet Assistive device: Rolling walker (2 wheeled) Gait Pattern/deviations: Step-to pattern;Decreased stride length;Trunk flexed     General Gait Details: Patient able to tolerate more weight on LE. Patient required cues for upright posture and RW sequencing.    Stairs            Wheelchair Mobility    Modified Rankin (Stroke Patients Only)       Balance                                    Cognition Arousal/Alertness: Lethargic Behavior During Therapy: WFL for tasks assessed/performed Overall Cognitive Status: Within Functional Limits for tasks assessed                      Exercises      General Comments         Pertinent Vitals/Pain Patient reports 7/10 pain level prior to therapy and denies pain after. Patient was positioned for comfort.    Home Living                      Prior Function            PT Goals (current goals can now be found in the care plan section) Progress towards PT goals: Progressing toward goals    Frequency  7X/week    PT Plan Current plan remains appropriate    Co-evaluation             End of Session Equipment Utilized During Treatment: Gait belt Activity Tolerance: Patient tolerated treatment well;Patient limited by lethargy Patient left: in chair;with call bell/phone within reach     Time: 0852-0921 PT Time Calculation (min): 29 min  Charges:                       G Codes:      [place], SPTA 04/23/2013, 9:40 AM

## 2013-04-23 NOTE — Clinical Social Work Note (Signed)
CSW has met with patient and provided SNF bed offers- she has chosen bed at [facility] and we anticipate dc tomorrow- FL2 on chart for MD signature.  Eduard Clos, MSW, [place]

## 2013-04-24 DIAGNOSIS — Z96659 Presence of unspecified artificial knee joint: Secondary | ICD-10-CM | POA: Diagnosis not present

## 2013-04-24 DIAGNOSIS — F329 Major depressive disorder, single episode, unspecified: Secondary | ICD-10-CM | POA: Diagnosis not present

## 2013-04-24 DIAGNOSIS — D509 Iron deficiency anemia, unspecified: Secondary | ICD-10-CM | POA: Diagnosis not present

## 2013-04-24 DIAGNOSIS — F411 Generalized anxiety disorder: Secondary | ICD-10-CM | POA: Diagnosis not present

## 2013-04-24 DIAGNOSIS — E785 Hyperlipidemia, unspecified: Secondary | ICD-10-CM | POA: Diagnosis not present

## 2013-04-24 DIAGNOSIS — E039 Hypothyroidism, unspecified: Secondary | ICD-10-CM | POA: Diagnosis not present

## 2013-04-24 DIAGNOSIS — N183 Chronic kidney disease, stage 3 unspecified: Secondary | ICD-10-CM | POA: Diagnosis not present

## 2013-04-24 DIAGNOSIS — Z471 Aftercare following joint replacement surgery: Secondary | ICD-10-CM | POA: Diagnosis not present

## 2013-04-24 DIAGNOSIS — D649 Anemia, unspecified: Secondary | ICD-10-CM | POA: Diagnosis not present

## 2013-04-24 DIAGNOSIS — F3289 Other specified depressive episodes: Secondary | ICD-10-CM | POA: Diagnosis not present

## 2013-04-24 DIAGNOSIS — I10 Essential (primary) hypertension: Secondary | ICD-10-CM | POA: Diagnosis not present

## 2013-04-24 DIAGNOSIS — J449 Chronic obstructive pulmonary disease, unspecified: Secondary | ICD-10-CM | POA: Diagnosis not present

## 2013-04-24 DIAGNOSIS — J45909 Unspecified asthma, uncomplicated: Secondary | ICD-10-CM | POA: Diagnosis not present

## 2013-04-24 DIAGNOSIS — D696 Thrombocytopenia, unspecified: Secondary | ICD-10-CM | POA: Diagnosis not present

## 2013-04-24 DIAGNOSIS — K5900 Constipation, unspecified: Secondary | ICD-10-CM | POA: Diagnosis not present

## 2013-04-24 DIAGNOSIS — Z5189 Encounter for other specified aftercare: Secondary | ICD-10-CM | POA: Diagnosis not present

## 2013-04-24 LAB — CBC
HCT: 28.6 % — ABNORMAL LOW (ref 36.0–46.0)
Hemoglobin: 9 g/dL — ABNORMAL LOW (ref 12.0–15.0)
MCH: 26.1 pg (ref 26.0–34.0)
MCHC: 31.5 g/dL (ref 30.0–36.0)
MCV: 82.9 fL (ref 78.0–100.0)
PLATELETS: 109 10*3/uL — AB (ref 150–400)
RBC: 3.45 MIL/uL — ABNORMAL LOW (ref 3.87–5.11)
RDW: 13.9 % (ref 11.5–15.5)
WBC: 4.8 10*3/uL (ref 4.0–10.5)

## 2013-04-24 NOTE — Progress Notes (Signed)
Physical Therapy Treatment Patient Details Name: Terri Bridges MRN: [id number] DOB: 03/07/1934 Today's Date: 04/24/2013    History of Present Illness pt presents with L TKA.      PT Comments    Patient able to tolerate therapy today. Patient is progressing towards goals. Patient able to perform therex and gait training with limited fatigue and no c/o pain. Patient able to perform ADLs at at counter without support. Patient reports d/c to SNF today. Patient will benefit from SNF for safety as she continues to progress.  Follow Up Recommendations  SNF     Equipment Recommendations  None recommended by PT    Recommendations for Other Services       Precautions / Restrictions Precautions Precautions: Knee;Fall Restrictions LLE Weight Bearing: Weight bearing as tolerated    Mobility  Bed Mobility Overal bed mobility: Needs Assistance Bed Mobility: Supine to Sit     Supine to sit: Min assist     General bed mobility comments: HOB elevated and patient required min A for hand placement and cues to lift trunk.  Transfers Overall transfer level: Needs assistance Equipment used: Rolling walker (2 wheeled) Transfers: Sit to/from Stand Sit to Stand: Min assist         General transfer comment: Patient requires min A for proper technique scoot to edge of bed.   Ambulation/Gait Ambulation/Gait assistance: Min guard Ambulation Distance (Feet): 20 Feet Assistive device: Rolling walker (2 wheeled) Gait Pattern/deviations: Step-to pattern;Decreased stride length;Trunk flexed     General Gait Details: Patient able to tolerate gait training today. Patient required cuesd for upright posture.   Stairs            Wheelchair Mobility    Modified Rankin (Stroke Patients Only)       Balance                                    Cognition Arousal/Alertness: Awake/alert Behavior During Therapy: WFL for tasks assessed/performed Overall Cognitive  Status: Within Functional Limits for tasks assessed                      Exercises Total Joint Exercises Ankle Circles/Pumps: AROM;Left;10 reps;Supine Quad Sets: AROM;Left;10 reps;Supine Heel Slides: AAROM;Left;5 reps;Supine Straight Leg Raises: AROM;Left;5 reps;Supine Long Arc Quad: AROM;Left;10 reps;Seated Marching in Standing: AROM;Seated;Both;10 reps    General Comments        Pertinent Vitals/Pain Patient denies pain.    Home Living                      Prior Function            PT Goals (current goals can now be found in the care plan section) Progress towards PT goals: Progressing toward goals    Frequency  7X/week    PT Plan Current plan remains appropriate    Co-evaluation             End of Session Equipment Utilized During Treatment: Gait belt Activity Tolerance: Patient tolerated treatment well Patient left: in chair;with call bell/phone within reach     Time: 4098-1191 PT Time Calculation (min): 34 min  Charges:                       G Codes:      [place], SPTA 04/24/2013, 10:18 AM

## 2013-04-24 NOTE — Progress Notes (Signed)
Seen and agreed 04/24/2013 Julia Elizabeth Robinette PTA [phone] pager [phone] office    

## 2013-04-27 DIAGNOSIS — Z96659 Presence of unspecified artificial knee joint: Secondary | ICD-10-CM | POA: Diagnosis not present

## 2013-04-27 DIAGNOSIS — I10 Essential (primary) hypertension: Secondary | ICD-10-CM | POA: Diagnosis not present

## 2013-04-27 DIAGNOSIS — D509 Iron deficiency anemia, unspecified: Secondary | ICD-10-CM | POA: Diagnosis not present

## 2013-04-27 DIAGNOSIS — K5900 Constipation, unspecified: Secondary | ICD-10-CM | POA: Diagnosis not present

## 2013-04-27 DIAGNOSIS — J449 Chronic obstructive pulmonary disease, unspecified: Secondary | ICD-10-CM | POA: Diagnosis not present

## 2013-04-28 NOTE — Discharge Summary (Signed)
SPORTS MEDICINE & JOINT REPLACEMENT   Lara Mulch, MD   Carlynn Spry, PA-C [place], [place], [place]  [address]                             [phone]  PATIENT ID: Terri Bridges        MRN:  [id number]          DOB/AGE: [birth year] / 79 y.o.    DISCHARGE SUMMARY  ADMISSION DATE:    04/21/2013 DISCHARGE DATE:   04/24/2013  ADMISSION DIAGNOSIS: osteoarthritis left knee    DISCHARGE DIAGNOSIS:  osteoarthritis left knee    ADDITIONAL DIAGNOSIS: Active Problems:   S/P total knee arthroplasty  Past Medical History  Diagnosis Date  . Allergy     uses Flonase daily as needed  . Hypothyroidism     takes Synthroid daily  . Hypertension     takes Losartan and Cardizem daily  . Depression     takes Zoloft daily  . GERD (gastroesophageal reflux disease)     takes Omeprazole daily and Protonix daily as needed  . Anxiety     takes Xanax daily as needed  . Asthma     Albuterol as needed  . History of bronchitis     last time many yrs ago  . Ringing in ears     sees Dr.Byers for this  . Joint pain   . Joint swelling   . Osteoporosis   . Constipation     takes an OTC stool softener  . Urinary frequency   . Urinary urgency   . Urinary incontinence   . Nocturia   . Dry eyes     uses eye drops  . Exertional shortness of breath   . Arthritis     "back" (04/21/2013)  . Scoliosis   . Chronic lower back pain     PROCEDURE: Procedure(s): TOTAL KNEE ARTHROPLASTY on 04/21/2013  CONSULTS:     HISTORY:  See H&P in chart  HOSPITAL COURSE:  ATARA PATERSON is a 79 y.o. admitted on 04/21/2013 and found to have a diagnosis of osteoarthritis left knee.  After appropriate laboratory studies were obtained  they were taken to the operating room on 04/21/2013 and underwent Procedure(s): TOTAL KNEE ARTHROPLASTY.   They were given perioperative antibiotics:  Anti-infectives   Start     Dose/Rate Route Frequency Ordered Stop   04/21/13 1600  ceFAZolin (ANCEF) IVPB 1  g/50 mL premix     1 g 100 mL/hr over 30 Minutes Intravenous Every 6 hours 04/21/13 1450 04/21/13 2244   04/21/13 0600  ceFAZolin (ANCEF) IVPB 2 g/50 mL premix     2 g 100 mL/hr over 30 Minutes Intravenous On call to O.R. 04/20/13 1309 04/21/13 1008    .  Tolerated the procedure well.  Placed with a foley intraoperatively.  Given Ofirmev at induction and for 48 hours.    POD# 1: Vital signs were stable.  Patient denied Chest pain, shortness of breath, or calf pain.  Patient was started on Lovenox 30 mg subcutaneously twice daily at 8am.  Consults to PT, OT, and care management were made.  The patient was weight bearing as tolerated.  CPM was placed on the operative leg 0-90 degrees for 6-8 hours a day.  Incentive spirometry was taught.  Dressing was changed.  Marcaine pump and hemovac were discontinued.      POD #2, Continued  PT for  ambulation and exercise program.  IV saline locked.  O2 discontinued.    The remainder of the hospital course was dedicated to ambulation and strengthening.   The patient was discharged on day post op day 3 in  Good condition.  Blood products given:none  DIAGNOSTIC STUDIES: Recent vital signs: No data found.      Recent laboratory studies:  Recent Labs  04/21/13 1542 04/22/13 0400 04/23/13 0404 04/24/13 0431  WBC 8.4 6.5 5.8 4.8  HGB 11.4* 10.0* 9.8* 9.0*  HCT 35.6* 31.9* 30.9* 28.6*  PLT 148* 132* 116* 109*    Recent Labs  04/21/13 1542 04/22/13 0400  NA  --  142  K  --  4.3  CL  --  106  CO2  --  25  BUN  --  18  CREATININE 1.01 1.13*  GLUCOSE  --  106*  CALCIUM  --  8.3*   Lab Results  Component Value Date   INR 0.99 04/09/2013     Recent Radiographic Studies :  Dg Chest 2 View  04/09/2013   CLINICAL DATA:  Pre operative respiratory exam. Osteoarthritis of the knee.  EXAM: CHEST  2 VIEW  COMPARISON:  09/16/2010  FINDINGS: The heart size and pulmonary vascularity are normal. Is chronic peribronchial thickening. There is a large  hiatal hernia. Prominent accentuation of the thoracic kyphosis with tortuosity and calcification of the thoracic aorta. The lungs are somewhat hyperinflated suggesting emphysema.  IMPRESSION: No acute abnormality. Probable chronic lung disease. Large hiatal hernia.   Electronically Signed   By: Rozetta Nunnery M.D.   On: 04/09/2013 10:02    DISCHARGE INSTRUCTIONS: Discharge Orders   Future Orders Complete By Expires   Call MD / Call 911  As directed    Change dressing  As directed    Constipation Prevention  As directed    CPM  As directed    Diet - low sodium heart healthy  As directed    Do not put a pillow under the knee. Place it under the heel.  As directed    Driving restrictions  As directed    Increase activity slowly as tolerated  As directed    Lifting restrictions  As directed    TED hose  As directed       DISCHARGE MEDICATIONS:     Medication List    STOP taking these medications       fish oil-omega-3 fatty acids 1000 MG capsule      TAKE these medications       albuterol (2.5 MG/3ML) 0.083% nebulizer solution  Commonly known as:  PROVENTIL  Take 3 mLs (2.5 mg total) by nebulization every 6 (six) hours as needed for wheezing or shortness of breath.     ALPRAZolam 0.25 MG tablet  Commonly known as:  XANAX  Take 2 tablets (0.5 mg total) by mouth 2 (two) times daily as needed for anxiety.     CALCIUM + D PO  Take 1 tablet by mouth daily.     diltiazem 240 MG 24 hr capsule  Commonly known as:  CARDIZEM CD  Take 240 mg by mouth daily.     enoxaparin 40 MG/0.4ML injection  Commonly known as:  LOVENOX  Inject 0.4 mLs (40 mg total) into the skin daily.     fluticasone 50 MCG/ACT nasal spray  Commonly known as:  FLONASE  Place 2 sprays into both nostrils daily as needed for allergies.     HYDROcodone-acetaminophen 5-325 MG per  tablet  Commonly known as:  NORCO/VICODIN  Take 1-2 tablets by mouth every 4 (four) hours as needed for moderate pain.     ibuprofen  600 MG tablet  Commonly known as:  ADVIL,MOTRIN  Take 1 tablet (600 mg total) by mouth every 6 (six) hours as needed for pain.     levothyroxine 150 MCG tablet  Commonly known as:  SYNTHROID  Take 1 tablet (150 mcg total) by mouth daily.     losartan 100 MG tablet  Commonly known as:  COZAAR  Take 1 tablet (100 mg total) by mouth daily.     methocarbamol 500 MG tablet  Commonly known as:  ROBAXIN  Take 1 tablet (500 mg total) by mouth every 6 (six) hours as needed for muscle spasms.     omeprazole 20 MG capsule  Commonly known as:  PRILOSEC  Take 20 mg by mouth daily.     pantoprazole 40 MG tablet  Commonly known as:  PROTONIX  Take 40 mg by mouth daily as needed (for reflux).     sertraline 100 MG tablet  Commonly known as:  ZOLOFT  Take 100 mg by mouth daily.     SYSTANE OP  Apply 1 drop to eye daily as needed (for dry eyes).        FOLLOW UP VISIT:       Follow-up Information   Follow up with Rudean Haskell, MD. Call on 05/06/2013.   Specialty:  Orthopedic Surgery   Contact information:   [place] [address] [phone]       DISPOSITION: SNF  CONDITION:  Vertell Novak 04/28/2013, 7:26 AM

## 2013-05-02 NOTE — Progress Notes (Signed)
Clinical social worker assisted with patient discharge to skilled nursing facility, [facility].  CSW addressed all family questions and concerns. CSW copied chart and added all important documents. CSW also set up patient transportation with [facility]. Clinical Social Worker will sign off for now as social work intervention is no longer needed.   Deforrest Bogle, MSW, LCSWA [phone] 

## 2013-05-06 DIAGNOSIS — Z96659 Presence of unspecified artificial knee joint: Secondary | ICD-10-CM | POA: Diagnosis not present

## 2013-05-06 DIAGNOSIS — Z471 Aftercare following joint replacement surgery: Secondary | ICD-10-CM | POA: Diagnosis not present

## 2013-05-14 DIAGNOSIS — Z96659 Presence of unspecified artificial knee joint: Secondary | ICD-10-CM | POA: Diagnosis not present

## 2013-05-14 DIAGNOSIS — R262 Difficulty in walking, not elsewhere classified: Secondary | ICD-10-CM | POA: Diagnosis not present

## 2013-05-14 DIAGNOSIS — M25669 Stiffness of unspecified knee, not elsewhere classified: Secondary | ICD-10-CM | POA: Diagnosis not present

## 2013-05-14 DIAGNOSIS — M25569 Pain in unspecified knee: Secondary | ICD-10-CM | POA: Diagnosis not present

## 2013-05-15 ENCOUNTER — Telehealth: Payer: Self-pay | Admitting: Internal Medicine

## 2013-05-15 NOTE — Telephone Encounter (Signed)
Called the patient to advise of Dr. Verlene Mayer instruction.  Patient should be taking Cardizem as prescribed.  Patient scheduled to return to clinic on 5/18 @ 10:45 a.m.  Patient verbalized understanding of these instructions.

## 2013-05-26 ENCOUNTER — Encounter: Payer: Self-pay | Admitting: Internal Medicine

## 2013-05-26 ENCOUNTER — Ambulatory Visit (INDEPENDENT_AMBULATORY_CARE_PROVIDER_SITE_OTHER): Payer: Medicare Other | Admitting: Internal Medicine

## 2013-05-26 VITALS — BP 140/78 | HR 80 | Temp 99.6°F | Wt 166.0 lb

## 2013-05-26 DIAGNOSIS — F411 Generalized anxiety disorder: Secondary | ICD-10-CM | POA: Diagnosis not present

## 2013-05-26 DIAGNOSIS — I10 Essential (primary) hypertension: Secondary | ICD-10-CM | POA: Diagnosis not present

## 2013-05-26 DIAGNOSIS — R609 Edema, unspecified: Secondary | ICD-10-CM

## 2013-05-26 MED ORDER — ALPRAZOLAM 0.5 MG PO TBDP: 0.5000 mg | ORAL_TABLET | Freq: Two times a day (BID) | ORAL | Status: DC | PRN

## 2013-05-26 MED ORDER — FUROSEMIDE 20 MG PO TABS: 20.0000 mg | ORAL_TABLET | Freq: Every day | ORAL | Status: DC

## 2013-05-26 NOTE — Progress Notes (Signed)
   Subjective:    Patient ID: Terri Bridges, female    DOB: 1934/11/29, 79 y.o.   MRN: [id number]  HPI  Patient had left knee replacement surgery in April. [facility] because she needed some additional help and physical therapy. She continues with therapy and has done very well with her knee. Has increasing range of motion the past few weeks. Does have to take Hydrocodone APAP at night for sleep because of pain. Has been crying more recently. Has been thinking about her son who passed away apparently of an MI in the summer of 2014. She actually found him and called the ambulance. Says this anxiety/grief reaction has been worse since her surgery. She has Xanax to take at night but she  now thinks she  needs to take some during the day as well. Have prescribed Xanax 0.5 mg twice daily for anxiety with refills.  Her blood pressure is elevated today. Had stopped Cardizem during her hospitalization but started back about a week ago. Would like to see blood pressure control a bit better.    Review of Systems     Objective:   Physical Exam  Crying in the office today when she measured her son. Skin warm and dry. Nodes none. Chest clear. Cardiac exam regular rate and rhythm. Trace lower extremity edema.      Assessment & Plan:  Essential hypertension  Grief reaction/anxiety-already on an antidepressant  Dependent edema  Status post left knee replacement  Plan: Start Lasix 20 mg daily in addition to 2 drug regimen for hypertension. This includes Cardizem and losartan. Return in 6 weeks. Will need basic metabolic panel at that time.

## 2013-05-26 NOTE — Patient Instructions (Addendum)
Take Furosemide twice daily for Blood pressure and dependent edema. Take Xanax twice daily. Return in 6 weeks.

## 2013-06-03 DIAGNOSIS — R262 Difficulty in walking, not elsewhere classified: Secondary | ICD-10-CM | POA: Diagnosis not present

## 2013-06-03 DIAGNOSIS — M25669 Stiffness of unspecified knee, not elsewhere classified: Secondary | ICD-10-CM | POA: Diagnosis not present

## 2013-06-03 DIAGNOSIS — Z96659 Presence of unspecified artificial knee joint: Secondary | ICD-10-CM | POA: Diagnosis not present

## 2013-06-03 DIAGNOSIS — M25569 Pain in unspecified knee: Secondary | ICD-10-CM | POA: Diagnosis not present

## 2013-06-06 DIAGNOSIS — Z96659 Presence of unspecified artificial knee joint: Secondary | ICD-10-CM | POA: Diagnosis not present

## 2013-06-06 DIAGNOSIS — M25669 Stiffness of unspecified knee, not elsewhere classified: Secondary | ICD-10-CM | POA: Diagnosis not present

## 2013-06-06 DIAGNOSIS — M25569 Pain in unspecified knee: Secondary | ICD-10-CM | POA: Diagnosis not present

## 2013-06-06 DIAGNOSIS — R262 Difficulty in walking, not elsewhere classified: Secondary | ICD-10-CM | POA: Diagnosis not present

## 2013-06-13 DIAGNOSIS — M25669 Stiffness of unspecified knee, not elsewhere classified: Secondary | ICD-10-CM | POA: Diagnosis not present

## 2013-06-13 DIAGNOSIS — Z96659 Presence of unspecified artificial knee joint: Secondary | ICD-10-CM | POA: Diagnosis not present

## 2013-06-13 DIAGNOSIS — R262 Difficulty in walking, not elsewhere classified: Secondary | ICD-10-CM | POA: Diagnosis not present

## 2013-06-13 DIAGNOSIS — M25569 Pain in unspecified knee: Secondary | ICD-10-CM | POA: Diagnosis not present

## 2013-06-20 DIAGNOSIS — M25669 Stiffness of unspecified knee, not elsewhere classified: Secondary | ICD-10-CM | POA: Diagnosis not present

## 2013-06-20 DIAGNOSIS — R262 Difficulty in walking, not elsewhere classified: Secondary | ICD-10-CM | POA: Diagnosis not present

## 2013-06-20 DIAGNOSIS — M25569 Pain in unspecified knee: Secondary | ICD-10-CM | POA: Diagnosis not present

## 2013-06-20 DIAGNOSIS — Z96659 Presence of unspecified artificial knee joint: Secondary | ICD-10-CM | POA: Diagnosis not present

## 2013-06-27 DIAGNOSIS — R262 Difficulty in walking, not elsewhere classified: Secondary | ICD-10-CM | POA: Diagnosis not present

## 2013-06-27 DIAGNOSIS — Z96659 Presence of unspecified artificial knee joint: Secondary | ICD-10-CM | POA: Diagnosis not present

## 2013-06-27 DIAGNOSIS — M25669 Stiffness of unspecified knee, not elsewhere classified: Secondary | ICD-10-CM | POA: Diagnosis not present

## 2013-06-27 DIAGNOSIS — M25569 Pain in unspecified knee: Secondary | ICD-10-CM | POA: Diagnosis not present

## 2013-07-14 ENCOUNTER — Ambulatory Visit (INDEPENDENT_AMBULATORY_CARE_PROVIDER_SITE_OTHER): Payer: Medicare Other | Admitting: Internal Medicine

## 2013-07-14 ENCOUNTER — Encounter: Payer: Self-pay | Admitting: Internal Medicine

## 2013-07-14 VITALS — BP 140/80 | HR 72 | Temp 98.2°F | Wt 168.0 lb

## 2013-07-14 DIAGNOSIS — J441 Chronic obstructive pulmonary disease with (acute) exacerbation: Secondary | ICD-10-CM

## 2013-07-14 DIAGNOSIS — R609 Edema, unspecified: Secondary | ICD-10-CM

## 2013-07-14 DIAGNOSIS — J440 Chronic obstructive pulmonary disease with acute lower respiratory infection: Secondary | ICD-10-CM

## 2013-07-14 DIAGNOSIS — F411 Generalized anxiety disorder: Secondary | ICD-10-CM | POA: Diagnosis not present

## 2013-07-14 DIAGNOSIS — I10 Essential (primary) hypertension: Secondary | ICD-10-CM | POA: Diagnosis not present

## 2013-07-14 DIAGNOSIS — J209 Acute bronchitis, unspecified: Secondary | ICD-10-CM

## 2013-07-14 LAB — BASIC METABOLIC PANEL
BUN: 18 mg/dL (ref 6–23)
CO2: 27 mEq/L (ref 19–32)
Calcium: 8.5 mg/dL (ref 8.4–10.5)
Chloride: 103 mEq/L (ref 96–112)
Creat: 1.09 mg/dL (ref 0.50–1.10)
Glucose, Bld: 86 mg/dL (ref 70–99)
POTASSIUM: 4.6 meq/L (ref 3.5–5.3)
SODIUM: 141 meq/L (ref 135–145)

## 2013-07-14 MED ORDER — FUROSEMIDE 20 MG PO TABS: 20.0000 mg | ORAL_TABLET | Freq: Every day | ORAL | Status: DC

## 2013-07-14 MED ORDER — LOSARTAN POTASSIUM 100 MG PO TABS: 100.0000 mg | ORAL_TABLET | Freq: Every day | ORAL | Status: DC

## 2013-07-14 MED ORDER — PREDNISONE 10 MG PO KIT: PACK | ORAL | Status: DC

## 2013-07-14 MED ORDER — LEVOFLOXACIN 500 MG PO TABS: 500.0000 mg | ORAL_TABLET | Freq: Every day | ORAL | Status: DC

## 2013-07-14 MED ORDER — DILTIAZEM HCL ER COATED BEADS 240 MG PO CP24: 240.0000 mg | ORAL_CAPSULE | Freq: Every day | ORAL | Status: DC

## 2013-07-14 NOTE — Progress Notes (Signed)
   Subjective:    Patient ID: Terri Bridges, female    DOB: 1934-06-16, 79 y.o.   MRN: [id number]  HPI  At last visit, Lasix 20 mg added for edema and HTN in addition to losartan and Cardizem. Gets out of breath easily and says this is worse since left knee replacement surgery. Not taking Lasix everyday. C/O of cough with discolored sputum onset yesterday. No fever or shaking chills.. Still having to take pain pills for knee pain sometimes. Son passed away a year ago on 07/20/2022. She still grieving his death. Granddaughter lost her job and she is having some financial issues trying to help granddaughter.    Review of Systems     Objective:   Physical Exam Chest clear without rales or wheezing; Cor: RRR; extremities: trace LE edema. Skin warm and dry, crying in office grieving over son's death a year ago. Pulse oximetry 97% on room air. No adenopathy or thyromegaly. Affect is depressed.        Assessment & Plan:  Bronchitis Dependent edema  Grief reaction-anniversary of son's death on 07/20/22 HTN COPD Anxiety and depression  Plan: Albuterol inhaler 2 sprays po qid or use albuterol nebulizer Lasix 20 mg every day - did not take today Levaquin 500 mg daily x 10 days Losartan refilled. Cardizem refilled Sterapred DS  10 mg  6-5-4-3-2-1 taper Samples of Symbicort inhaler 160 x 2 to use twice a day.  25 minutes spent with patient RTC in 2 weeks

## 2013-07-14 NOTE — Patient Instructions (Addendum)
Take lasix everyday. Take levaquin 500 mg daily x 10 days. Take Prednisone as directed in tapering course over 6 days. Use albuterol and Symbicort inhalers as directed. Return in 2 weeks.

## 2013-07-28 ENCOUNTER — Encounter: Payer: Self-pay | Admitting: Internal Medicine

## 2013-07-28 ENCOUNTER — Ambulatory Visit (INDEPENDENT_AMBULATORY_CARE_PROVIDER_SITE_OTHER): Payer: Medicare Other | Admitting: Internal Medicine

## 2013-07-28 VITALS — BP 140/70 | HR 64 | Temp 99.3°F | Wt 168.0 lb

## 2013-07-28 DIAGNOSIS — R5381 Other malaise: Secondary | ICD-10-CM | POA: Diagnosis not present

## 2013-07-28 DIAGNOSIS — R718 Other abnormality of red blood cells: Secondary | ICD-10-CM | POA: Diagnosis not present

## 2013-07-28 DIAGNOSIS — I10 Essential (primary) hypertension: Secondary | ICD-10-CM | POA: Diagnosis not present

## 2013-07-28 DIAGNOSIS — R5383 Other fatigue: Secondary | ICD-10-CM

## 2013-07-28 LAB — COMPREHENSIVE METABOLIC PANEL
ALK PHOS: 87 U/L (ref 39–117)
ALT: <8 U/L (ref 0–35)
AST: 11 U/L (ref 0–37)
Albumin: 3.9 g/dL (ref 3.5–5.2)
BUN: 19 mg/dL (ref 6–23)
CO2: 28 mEq/L (ref 19–32)
CREATININE: 1.12 mg/dL — AB (ref 0.50–1.10)
Calcium: 8.9 mg/dL (ref 8.4–10.5)
Chloride: 103 mEq/L (ref 96–112)
Glucose, Bld: 91 mg/dL (ref 70–99)
POTASSIUM: 4.2 meq/L (ref 3.5–5.3)
Sodium: 140 mEq/L (ref 135–145)
Total Bilirubin: 0.6 mg/dL (ref 0.2–1.2)
Total Protein: 6.4 g/dL (ref 6.0–8.3)

## 2013-07-28 LAB — IRON AND TIBC
%SAT: 21 % (ref 20–55)
Iron: 74 ug/dL (ref 42–145)
TIBC: 355 ug/dL (ref 250–470)
UIBC: 281 ug/dL (ref 125–400)

## 2013-07-28 LAB — CBC WITH DIFFERENTIAL/PLATELET
Basophils Absolute: 0.1 10*3/uL (ref 0.0–0.1)
Basophils Relative: 1 % (ref 0–1)
EOS PCT: 2 % (ref 0–5)
Eosinophils Absolute: 0.1 10*3/uL (ref 0.0–0.7)
HEMATOCRIT: 39.1 % (ref 36.0–46.0)
HEMOGLOBIN: 12.8 g/dL (ref 12.0–15.0)
LYMPHS ABS: 1.4 10*3/uL (ref 0.7–4.0)
LYMPHS PCT: 24 % (ref 12–46)
MCH: 25.3 pg — ABNORMAL LOW (ref 26.0–34.0)
MCHC: 32.7 g/dL (ref 30.0–36.0)
MCV: 77.4 fL — AB (ref 78.0–100.0)
Monocytes Absolute: 0.5 10*3/uL (ref 0.1–1.0)
Monocytes Relative: 9 % (ref 3–12)
Neutro Abs: 3.8 10*3/uL (ref 1.7–7.7)
Neutrophils Relative %: 64 % (ref 43–77)
PLATELETS: 189 10*3/uL (ref 150–400)
RBC: 5.05 MIL/uL (ref 3.87–5.11)
RDW: 15.6 % — ABNORMAL HIGH (ref 11.5–15.5)
WBC: 6 10*3/uL (ref 4.0–10.5)

## 2013-07-28 LAB — TSH: TSH: 0.115 u[IU]/mL — ABNORMAL LOW (ref 0.350–4.500)

## 2013-07-28 NOTE — Patient Instructions (Signed)
Take amlodipine 5 mg daily. Take Lasix 20 mg daily Take Losartan 100 mg daily. Stop Diltiazem. Return in 2 weeks.

## 2013-07-28 NOTE — Progress Notes (Signed)
   Subjective:    Patient ID: Terri Bridges, female    DOB: 10-25-1934, 79 y.o.   MRN: [id number]  HPI Here today to followup on blood pressure. At last visit, had not taken diuretic. Blood pressure is still not at goal even though she's taken all of her medications well before coming to the office this morning. Also complaining bitterly of fatigue and no energy. History of COPD. CBC has not been checked since knee replacement. Will draw CBC with differential, iron /TIBC , TSH, B12 level C. met today.    Review of Systems     Objective:   Physical Exam  Not examined. Spent 10 minutes between with patient about these issues. Reminded her to use inhalers for history of COPD.      Assessment & Plan:  Hypertension  Fatigue  Plan: Patient will discontinue Cardizem. Add amlodipine 5 mg daily which she tolerated before but not 10 mg daily. Continue Lasix 20 mg daily and losartan 100 mg daily. If this does not work, we will consider either increasing Lasix or perhaps adding another medication. She is to return in 2 weeks for blood pressure check.

## 2013-08-05 DIAGNOSIS — Z96659 Presence of unspecified artificial knee joint: Secondary | ICD-10-CM | POA: Diagnosis not present

## 2013-08-11 ENCOUNTER — Encounter: Payer: Self-pay | Admitting: Internal Medicine

## 2013-08-11 ENCOUNTER — Ambulatory Visit (INDEPENDENT_AMBULATORY_CARE_PROVIDER_SITE_OTHER): Payer: Medicare Other | Admitting: Internal Medicine

## 2013-08-11 VITALS — BP 120/90 | HR 88 | Temp 99.4°F | Wt 165.0 lb

## 2013-08-11 DIAGNOSIS — I10 Essential (primary) hypertension: Secondary | ICD-10-CM | POA: Diagnosis not present

## 2013-08-11 DIAGNOSIS — Z96659 Presence of unspecified artificial knee joint: Secondary | ICD-10-CM | POA: Diagnosis not present

## 2013-08-11 MED ORDER — FUROSEMIDE 40 MG PO TABS: 40.0000 mg | ORAL_TABLET | Freq: Every day | ORAL | Status: DC

## 2013-08-11 NOTE — Progress Notes (Signed)
   Subjective:    Patient ID: Terri Bridges, female    DOB: 1934-02-20, 79 y.o.   MRN: [id number]  HPI  In today for blood pressure check. Is on losartan 100 mg daily, Lasix 20 mg daily, amlodipine 5 daily. Is feeling better and less washed out since discontinuing diltiazem. Is a bit depressed. Left knee has been hurting. Is on Zoloft. Recent TSH was a bit low on Synthroid 0.15 mg daily but were to continue with same dose for now and recheck in a few months. Granddaughter lost her job and she's having to help her out financially. Granddaughter is not receiving unemployment. This is been stressful. Granddaughter lives with her.  Continues to get physical therapy for left knee replacement done this past spring. Has felt a catch in pain in left knee and will be seeing Dr. Ronnie Derby tomorrow.  Recently she was complaining of fatigue. CBC was checked and was normal with the exception of low MCV. Iron level was normal.    Review of Systems     Objective:   Physical Exam Chest clear. Cardiac exam regular rate and rhythm. Extremities without pitting edema.       Assessment & Plan:  Hypertension-took several blood pressure readings today. The best I got was 120/90 after resting for 20 minutes. Were going to increase furosemide to 40 mg daily and she'll return in 4 weeks. She'll continue losartan 100 mg daily and amlodipine 5 mg daily.

## 2013-08-11 NOTE — Patient Instructions (Signed)
Increase furosemide to 40 mg daily. Continue amlodipine 5 mg daily and losartan 100 mg daily. Return in 4 weeks for office visit blood pressure check and basic metabolic panel.

## 2013-08-12 ENCOUNTER — Other Ambulatory Visit: Payer: Self-pay

## 2013-08-12 MED ORDER — SERTRALINE HCL 100 MG PO TABS
100.0000 mg | ORAL_TABLET | Freq: Every day | ORAL | Status: DC
Start: 2013-08-12 — End: 2014-09-12

## 2013-08-13 ENCOUNTER — Other Ambulatory Visit: Payer: Self-pay | Admitting: Orthopedic Surgery

## 2013-08-13 ENCOUNTER — Encounter (HOSPITAL_COMMUNITY): Payer: Self-pay | Admitting: Pharmacy Technician

## 2013-08-15 ENCOUNTER — Encounter (HOSPITAL_COMMUNITY): Payer: Self-pay | Admitting: Emergency Medicine

## 2013-08-15 ENCOUNTER — Emergency Department (HOSPITAL_COMMUNITY): Payer: Medicare Other

## 2013-08-15 ENCOUNTER — Other Ambulatory Visit: Payer: Self-pay

## 2013-08-15 ENCOUNTER — Emergency Department (HOSPITAL_COMMUNITY)
Admission: EM | Admit: 2013-08-15 | Discharge: 2013-08-15 | Disposition: A | Discharge status: Home / Self Care | Payer: Medicare Other | Attending: Emergency Medicine | Admitting: Emergency Medicine

## 2013-08-15 DIAGNOSIS — S0100XA Unspecified open wound of scalp, initial encounter: Secondary | ICD-10-CM | POA: Diagnosis not present

## 2013-08-15 DIAGNOSIS — R112 Nausea with vomiting, unspecified: Secondary | ICD-10-CM | POA: Insufficient documentation

## 2013-08-15 DIAGNOSIS — S0191XA Laceration without foreign body of unspecified part of head, initial encounter: Secondary | ICD-10-CM

## 2013-08-15 DIAGNOSIS — S199XXA Unspecified injury of neck, initial encounter: Secondary | ICD-10-CM | POA: Diagnosis not present

## 2013-08-15 DIAGNOSIS — F411 Generalized anxiety disorder: Secondary | ICD-10-CM | POA: Diagnosis not present

## 2013-08-15 DIAGNOSIS — Y9289 Other specified places as the place of occurrence of the external cause: Secondary | ICD-10-CM | POA: Insufficient documentation

## 2013-08-15 DIAGNOSIS — J45909 Unspecified asthma, uncomplicated: Secondary | ICD-10-CM | POA: Insufficient documentation

## 2013-08-15 DIAGNOSIS — F329 Major depressive disorder, single episode, unspecified: Secondary | ICD-10-CM | POA: Diagnosis not present

## 2013-08-15 DIAGNOSIS — I10 Essential (primary) hypertension: Secondary | ICD-10-CM | POA: Diagnosis not present

## 2013-08-15 DIAGNOSIS — Z87891 Personal history of nicotine dependence: Secondary | ICD-10-CM | POA: Diagnosis not present

## 2013-08-15 DIAGNOSIS — Z79899 Other long term (current) drug therapy: Secondary | ICD-10-CM | POA: Diagnosis not present

## 2013-08-15 DIAGNOSIS — T1490XA Injury, unspecified, initial encounter: Secondary | ICD-10-CM | POA: Diagnosis not present

## 2013-08-15 DIAGNOSIS — N179 Acute kidney failure, unspecified: Secondary | ICD-10-CM | POA: Diagnosis not present

## 2013-08-15 DIAGNOSIS — S0993XA Unspecified injury of face, initial encounter: Secondary | ICD-10-CM | POA: Diagnosis not present

## 2013-08-15 DIAGNOSIS — S0990XA Unspecified injury of head, initial encounter: Secondary | ICD-10-CM | POA: Diagnosis not present

## 2013-08-15 DIAGNOSIS — K219 Gastro-esophageal reflux disease without esophagitis: Secondary | ICD-10-CM | POA: Diagnosis not present

## 2013-08-15 DIAGNOSIS — G8929 Other chronic pain: Secondary | ICD-10-CM | POA: Diagnosis not present

## 2013-08-15 DIAGNOSIS — Z8669 Personal history of other diseases of the nervous system and sense organs: Secondary | ICD-10-CM | POA: Insufficient documentation

## 2013-08-15 DIAGNOSIS — F3289 Other specified depressive episodes: Secondary | ICD-10-CM | POA: Insufficient documentation

## 2013-08-15 DIAGNOSIS — Z8739 Personal history of other diseases of the musculoskeletal system and connective tissue: Secondary | ICD-10-CM | POA: Insufficient documentation

## 2013-08-15 DIAGNOSIS — Y93G3 Activity, cooking and baking: Secondary | ICD-10-CM | POA: Insufficient documentation

## 2013-08-15 DIAGNOSIS — R55 Syncope and collapse: Secondary | ICD-10-CM | POA: Diagnosis not present

## 2013-08-15 DIAGNOSIS — W1809XA Striking against other object with subsequent fall, initial encounter: Secondary | ICD-10-CM | POA: Diagnosis not present

## 2013-08-15 DIAGNOSIS — M542 Cervicalgia: Secondary | ICD-10-CM | POA: Diagnosis not present

## 2013-08-15 DIAGNOSIS — E039 Hypothyroidism, unspecified: Secondary | ICD-10-CM | POA: Insufficient documentation

## 2013-08-15 DIAGNOSIS — R4182 Altered mental status, unspecified: Secondary | ICD-10-CM | POA: Diagnosis not present

## 2013-08-15 LAB — CBC WITH DIFFERENTIAL/PLATELET
BASOS ABS: 0 10*3/uL (ref 0.0–0.1)
BASOS PCT: 0 % (ref 0–1)
Eosinophils Absolute: 0.1 10*3/uL (ref 0.0–0.7)
Eosinophils Relative: 2 % (ref 0–5)
HCT: 39 % (ref 36.0–46.0)
HEMOGLOBIN: 12.7 g/dL (ref 12.0–15.0)
LYMPHS PCT: 16 % (ref 12–46)
Lymphs Abs: 1 10*3/uL (ref 0.7–4.0)
MCH: 26.1 pg (ref 26.0–34.0)
MCHC: 32.6 g/dL (ref 30.0–36.0)
MCV: 80.1 fL (ref 78.0–100.0)
Monocytes Absolute: 0.5 10*3/uL (ref 0.1–1.0)
Monocytes Relative: 8 % (ref 3–12)
NEUTROS PCT: 74 % (ref 43–77)
Neutro Abs: 4.7 10*3/uL (ref 1.7–7.7)
Platelets: 158 10*3/uL (ref 150–400)
RBC: 4.87 MIL/uL (ref 3.87–5.11)
RDW: 14.5 % (ref 11.5–15.5)
WBC: 6.3 10*3/uL (ref 4.0–10.5)

## 2013-08-15 LAB — BASIC METABOLIC PANEL
Anion gap: 16 — ABNORMAL HIGH (ref 5–15)
BUN: 25 mg/dL — ABNORMAL HIGH (ref 6–23)
CO2: 25 meq/L (ref 19–32)
Calcium: 8.8 mg/dL (ref 8.4–10.5)
Chloride: 100 mEq/L (ref 96–112)
Creatinine, Ser: 1.76 mg/dL — ABNORMAL HIGH (ref 0.50–1.10)
GFR calc non Af Amer: 27 mL/min — ABNORMAL LOW (ref >90)
GFR, EST AFRICAN AMERICAN: 31 mL/min — AB (ref >90)
Glucose, Bld: 114 mg/dL — ABNORMAL HIGH (ref 70–99)
POTASSIUM: 3.9 meq/L (ref 3.7–5.3)
Sodium: 141 mEq/L (ref 137–147)

## 2013-08-15 LAB — I-STAT TROPONIN, ED: Troponin i, poc: 0 ng/mL (ref 0.00–0.08)

## 2013-08-15 LAB — URINALYSIS, ROUTINE W REFLEX MICROSCOPIC
Bilirubin Urine: NEGATIVE
Glucose, UA: NEGATIVE mg/dL
Hgb urine dipstick: NEGATIVE
Ketones, ur: NEGATIVE mg/dL
NITRITE: NEGATIVE
PH: 5.5 (ref 5.0–8.0)
Protein, ur: NEGATIVE mg/dL
SPECIFIC GRAVITY, URINE: 1.012 (ref 1.005–1.030)
UROBILINOGEN UA: 0.2 mg/dL (ref 0.0–1.0)

## 2013-08-15 LAB — URINE MICROSCOPIC-ADD ON

## 2013-08-15 MED ORDER — FUROSEMIDE 20 MG PO TABS: 20.0000 mg | ORAL_TABLET | Freq: Every day | ORAL | Status: DC

## 2013-08-15 MED ORDER — LEVOTHYROXINE SODIUM 150 MCG PO TABS: 150.0000 ug | ORAL_TABLET | Freq: Every day | ORAL | Status: DC

## 2013-08-15 MED ORDER — SODIUM CHLORIDE 0.9 % IV BOLUS (SEPSIS): 1000.0000 mL | Freq: Once | INTRAVENOUS | Status: DC

## 2013-08-15 MED ORDER — SODIUM CHLORIDE 0.9 % IV BOLUS (SEPSIS)
1000.0000 mL | Freq: Once | INTRAVENOUS | Status: AC
  Administered 2013-08-15: 1000 mL via INTRAVENOUS

## 2013-08-15 NOTE — ED Provider Notes (Signed)
CSN: [id number]     Arrival date & time 08/15/13  1833 History   First MD Initiated Contact with Patient 08/15/13 1839     Chief Complaint  Patient presents with  . Fall     (Consider location/radiation/quality/duration/timing/severity/associated sxs/prior Treatment) HPI Comments: The patient is a 79 year old female past medical history of hypertension, hypothyroidism, reflux, brought in by EMS with a  chief complaint of syncope today. The patient reports witness syncopal event just prior to arrival.  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 does not remember any further events. Per the patient's friend the patient syncopized while washing her hands, falling backwards and striking her head against a corner. The patient's friend reports total loss of consciousness for several seconds, coming to moaning and then vomiting. The patient denies history of syncope, cardiac arrhythmia, recent illness. Denies anticoagulation. Upon further questioning the patient reports recent increased her Lasix to 40 mg a day 3 days ago.  The history is provided by the patient. No language interpreter was used.    Past Medical History  Diagnosis Date  . Allergy     uses Flonase daily as needed  . Hypothyroidism     takes Synthroid daily  . Hypertension     takes Losartan and Cardizem daily  . Depression     takes Zoloft daily  . GERD (gastroesophageal reflux disease)     takes Omeprazole daily and Protonix daily as needed  . Anxiety     takes Xanax daily as needed  . Asthma     Albuterol as needed  . History of bronchitis     last time many yrs ago  . Ringing in ears     sees Dr.Byers for this  . Joint pain   . Joint swelling   . Osteoporosis   . Constipation     takes an OTC stool softener  . Urinary frequency   . Urinary urgency   . Urinary incontinence   . Nocturia   . Dry eyes     uses eye drops  . Exertional shortness of breath   .  Arthritis     "back" (04/21/2013)  . Scoliosis   . Chronic lower back pain    Past Surgical History  Procedure Laterality Date  . Nasal sinus surgery      x 4  . Knee arthroscopy Right   . Shoulder arthroscopy w/ rotator cuff repair Right   . Cataract extraction w/ intraocular lens  implant, bilateral Bilateral   . Tubal ligation    . Cardiac catheterization  2005  . Colonoscopy    . Esophagogastroduodenoscopy    . Total knee arthroplasty Left 04/21/2013  . Tonsillectomy  1940's  . Total knee arthroplasty Left 04/21/2013    Procedure: TOTAL KNEE ARTHROPLASTY;  Surgeon: Vickey Huger, MD;  Location: [facility];  Service: Orthopedics;  Laterality: Left;   Family History  Problem Relation Age of Onset  . Heart failure Mother   . Heart failure Father    History  Substance Use Topics  . Smoking status: Former Smoker -- 1.00 packs/day for 10 years    Types: Cigarettes  . Smokeless tobacco: Never Used     Comment: quit smoking in Mar 1993  . Alcohol Use: No   OB History   Grav Para Term Preterm Abortions TAB SAB Ect Mult Living  Review of Systems  Constitutional: Negative for fever and chills.  Gastrointestinal: Positive for vomiting. Negative for abdominal pain.  Musculoskeletal: Positive for neck pain.  Skin: Positive for wound.  Neurological: Positive for syncope. Negative for weakness and numbness.      Allergies  Review of patient's allergies indicates no known allergies.  Home Medications   Prior to Admission medications   Medication Sig Start Date End Date Taking? Authorizing Provider  albuterol (PROVENTIL HFA;VENTOLIN HFA) 108 (90 BASE) MCG/ACT inhaler Inhale 2 puffs into the lungs every 6 (six) hours as needed for wheezing or shortness of breath.    Historical Provider, MD  ALPRAZolam Duanne Moron) 0.5 MG tablet Take 0.5 mg by mouth at bedtime as needed for anxiety.    Historical Provider, MD  amLODipine (NORVASC) 5 MG tablet Take 5 mg by mouth daily.     Historical Provider, MD  Calcium-Vitamin D (CALTRATE 600 PLUS-VIT D PO) Take 1 tablet by mouth daily.    Historical Provider, MD  furosemide (LASIX) 40 MG tablet Take 40 mg by mouth.    Historical Provider, MD  levothyroxine (SYNTHROID, LEVOTHROID) 150 MCG tablet Take 1 tablet (150 mcg total) by mouth daily before breakfast. 08/15/13   Elby Showers, MD  losartan (COZAAR) 100 MG tablet Take 100 mg by mouth daily.    Historical Provider, MD  Omega-3 Fatty Acids (FISH OIL PO) Take 1 capsule by mouth daily.    Historical Provider, MD  omeprazole (PRILOSEC) 20 MG capsule Take 20 mg by mouth daily.    Historical Provider, MD  sertraline (ZOLOFT) 100 MG tablet Take 1 tablet (100 mg total) by mouth daily. 08/12/13   Elby Showers, MD   BP 136/58  Pulse 84  Temp(Src) 97.9 F (36.6 C) (Oral)  Resp 20  SpO2 95% Physical Exam  Nursing note and vitals reviewed. Constitutional: She is oriented to person, place, and time. She appears well-developed and well-nourished. No distress. Cervical collar in place.  Vomit on shirt  HENT:  Head: Normocephalic.    Mouth/Throat: No oropharyngeal exudate.  To sooner laceration to the occipital scalp, no obvious foreign bodies, oozing blood.   Eyes: EOM are normal. Pupils are equal, round, and reactive to light.  Neck:  Full range of C-spine after her c-collar removed.  Cardiovascular: Normal rate and regular rhythm.   Pulses:      Radial pulses are 2+ on the right side, and 2+ on the left side.  Pulmonary/Chest: Effort normal and breath sounds normal. No respiratory distress. She has no wheezes.  Abdominal: Soft. Bowel sounds are normal.  Musculoskeletal: Normal range of motion.  Neurological: She is alert and oriented to person, place, and time. She is not disoriented. No cranial nerve deficit or sensory deficit. She exhibits normal muscle tone. GCS eye subscore is 4. GCS verbal subscore is 5. GCS motor subscore is 6.  Speech is clear and goal oriented, follows  commands Cranial nerves III - XII grossly intact, no facial droop Normal strength in upper and lower extremities bilaterally, strong and equal grip strength Sensation normal to light touch Moves all 4 extremities without ataxia, coordination intact Normal finger to nose and rapid alternating movements No pronator drift   Skin: Skin is warm and dry. She is not diaphoretic.  Psychiatric: She has a normal mood and affect. Her behavior is normal.    ED Course  Procedures (including critical care time) Labs Review Results for orders placed during the hospital encounter of 08/15/13  CBC  WITH DIFFERENTIAL      Result Value Ref Range   WBC 6.3  4.0 - 10.5 K/uL   RBC 4.87  3.87 - 5.11 MIL/uL   Hemoglobin 12.7  12.0 - 15.0 g/dL   HCT 39.0  36.0 - 46.0 %   MCV 80.1  78.0 - 100.0 fL   MCH 26.1  26.0 - 34.0 pg   MCHC 32.6  30.0 - 36.0 g/dL   RDW 14.5  11.5 - 15.5 %   Platelets 158  150 - 400 K/uL   Neutrophils Relative % 74  43 - 77 %   Neutro Abs 4.7  1.7 - 7.7 K/uL   Lymphocytes Relative 16  12 - 46 %   Lymphs Abs 1.0  0.7 - 4.0 K/uL   Monocytes Relative 8  3 - 12 %   Monocytes Absolute 0.5  0.1 - 1.0 K/uL   Eosinophils Relative 2  0 - 5 %   Eosinophils Absolute 0.1  0.0 - 0.7 K/uL   Basophils Relative 0  0 - 1 %   Basophils Absolute 0.0  0.0 - 0.1 K/uL  BASIC METABOLIC PANEL      Result Value Ref Range   Sodium 141  137 - 147 mEq/L   Potassium 3.9  3.7 - 5.3 mEq/L   Chloride 100  96 - 112 mEq/L   CO2 25  19 - 32 mEq/L   Glucose, Bld 114 (*) 70 - 99 mg/dL   BUN 25 (*) 6 - 23 mg/dL   Creatinine, Ser 1.76 (*) 0.50 - 1.10 mg/dL   Calcium 8.8  8.4 - 10.5 mg/dL   GFR calc non Af Amer 27 (*) >90 mL/min   GFR calc Af Amer 31 (*) >90 mL/min   Anion gap 16 (*) 5 - 15  URINALYSIS, ROUTINE W REFLEX MICROSCOPIC      Result Value Ref Range   Color, Urine YELLOW  YELLOW   APPearance CLEAR  CLEAR   Specific Gravity, Urine 1.012  1.005 - 1.030   pH 5.5  5.0 - 8.0   Glucose, UA NEGATIVE   NEGATIVE mg/dL   Hgb urine dipstick NEGATIVE  NEGATIVE   Bilirubin Urine NEGATIVE  NEGATIVE   Ketones, ur NEGATIVE  NEGATIVE mg/dL   Protein, ur NEGATIVE  NEGATIVE mg/dL   Urobilinogen, UA 0.2  0.0 - 1.0 mg/dL   Nitrite NEGATIVE  NEGATIVE   Leukocytes, UA TRACE (*) NEGATIVE  URINE MICROSCOPIC-ADD ON      Result Value Ref Range   Squamous Epithelial / LPF FEW (*) RARE   WBC, UA 7-10  <3 WBC/hpf   Bacteria, UA FEW (*) RARE  I-STAT TROPOININ, ED      Result Value Ref Range   Troponin i, poc 0.00  0.00 - 0.08 ng/mL   Comment 3            Dg Chest 2 View  08/15/2013   CLINICAL DATA:  79 year old female with syncope and nausea.  EXAM: CHEST  2 VIEW  COMPARISON:  04/09/2013 and multiple prior chest radiographs  FINDINGS: The cardiomediastinal silhouette is stable.  A large hiatal hernia and eventration of the anterior right hemidiaphragm again noted.  There is no evidence of focal airspace disease, pulmonary edema, suspicious pulmonary nodule/mass, pleural effusion, or pneumothorax. No acute bony abnormalities are identified.  IMPRESSION: No active cardiopulmonary disease.  Large hiatal hernia again identified.   Electronically Signed   By: Hassan Rowan M.D.   On: 08/15/2013 20:48  Ct Head Wo Contrast  08/15/2013   CLINICAL DATA:  Syncopal episode.  Hit head.  Head pain.  Neck pain.  EXAM: CT HEAD WITHOUT CONTRAST  CT CERVICAL SPINE WITHOUT CONTRAST  TECHNIQUE: Multidetector CT imaging of the head and cervical spine was performed following the standard protocol without intravenous contrast. Multiplanar CT image reconstructions of the cervical spine were also generated.  COMPARISON:  CT head 07/26/2012.  FINDINGS: CT HEAD FINDINGS  No evidence for acute infarction, hemorrhage, mass lesion, hydrocephalus, or extra-axial fluid. Generalized atrophy. Chronic microvascular ischemic change affects the periventricular and subcortical white matter. The calvarium appears intact. There is a moderate-sized scalp  hematoma and laceration with subcutaneous air in the midline occiput region. No contrecoup injury. No subarachnoid blood. Chronic sinusitis with evidence of functional endoscopic sinus surgery in the maxillary and ethmoid regions. The sphenoid and frontal sinuses are nearly completely filled with fluid. There is proliferative osseous reaction suggesting chronicity.  CT CERVICAL SPINE FINDINGS  There is no visible cervical spine fracture or traumatic subluxation. Mild spondylosis affects the C4-5 and C5-6 disc spaces. There is multilevel facet arthropathy. No neck masses are evident. Carotid atherosclerosis is noted. No lung apex lesion is seen.  IMPRESSION: Atrophy and small vessel disease.  Midline scalp hematoma posteriorly with laceration, but no underlying skull fracture or visible intracranial hemorrhage.  Cervical spondylosis. No cervical spine fracture or traumatic subluxation.   Electronically Signed   By: Rolla Flatten M.D.   On: 08/15/2013 19:36   Ct Cervical Spine Wo Contrast  08/15/2013   CLINICAL DATA:  Syncopal episode.  Hit head.  Head pain.  Neck pain.  EXAM: CT HEAD WITHOUT CONTRAST  CT CERVICAL SPINE WITHOUT CONTRAST  TECHNIQUE: Multidetector CT imaging of the head and cervical spine was performed following the standard protocol without intravenous contrast. Multiplanar CT image reconstructions of the cervical spine were also generated.  COMPARISON:  CT head 07/26/2012.  FINDINGS: CT HEAD FINDINGS  No evidence for acute infarction, hemorrhage, mass lesion, hydrocephalus, or extra-axial fluid. Generalized atrophy. Chronic microvascular ischemic change affects the periventricular and subcortical white matter. The calvarium appears intact. There is a moderate-sized scalp hematoma and laceration with subcutaneous air in the midline occiput region. No contrecoup injury. No subarachnoid blood. Chronic sinusitis with evidence of functional endoscopic sinus surgery in the maxillary and ethmoid regions.  The sphenoid and frontal sinuses are nearly completely filled with fluid. There is proliferative osseous reaction suggesting chronicity.  CT CERVICAL SPINE FINDINGS  There is no visible cervical spine fracture or traumatic subluxation. Mild spondylosis affects the C4-5 and C5-6 disc spaces. There is multilevel facet arthropathy. No neck masses are evident. Carotid atherosclerosis is noted. No lung apex lesion is seen.  IMPRESSION: Atrophy and small vessel disease.  Midline scalp hematoma posteriorly with laceration, but no underlying skull fracture or visible intracranial hemorrhage.  Cervical spondylosis. No cervical spine fracture or traumatic subluxation.   Electronically Signed   By: Rolla Flatten M.D.   On: 08/15/2013 19:36    EKG Interpretation   Date/Time:  Friday August 15 2013 18:48:27 EDT Ventricular Rate:  90 PR Interval:  238 QRS Duration: 100 QT Interval:  383 QTC Calculation: 469 R Axis:   72 Text Interpretation:  Sinus rhythm Prolonged PR interval No significant  change was found Confirmed by CAMPOS  MD, KEVIN ([id number]) on 08/15/2013  6:51:57 PM      MDM   Final diagnoses:  Syncope, unspecified syncope type  Laceration of head, initial encounter  Acute kidney injury   Patient presents after a witnessed syncopal event today. No neurologic deficits on exam approximately 2 cm laceration without obvious foreign bodies to back of head. Will CT to evaluate intracranial process., labs, EKG, urine sent. BMP shows slight elevation of BUN and creatinine, likely do to recent increase in Lasix. CT head shows laceration with contusion noa chest x-ray without acute abnormalities. Dr. Venora Maples also evaluated the patient during this encounter. And agrees his syncope likely secondary to increase in Lasix and long period standing. Staples placed by Delena Bali, NP. UA shows 7-10 WBC, few bacteria, trace leukocytes, will not treat at this time. Urine culture sent.  Meds given in ED:  Medications    sodium chloride 0.9 % bolus 1,000 mL (1,000 mLs Intravenous New Bag/Given 08/15/13 2048)    Discharge Medication List as of 08/15/2013  9:19 PM    START taking these medications   Details  furosemide (LASIX) 20 MG tablet Take 1 tablet (20 mg total) by mouth daily., Starting 08/15/2013, Until Discontinued, Print            Harvie Heck, PA-C 08/16/13 2314

## 2013-08-15 NOTE — ED Notes (Signed)
Pt here via EMS with witnessed fall at church while cooking. Pt hit head on table. Pt denies blood thinners, back/neck pain, blurred vision, n/v, dizziness. Hematoma noted to occipital region. Neuro intact.  MAE equally, equal strength. NAD noted. No pain reported.

## 2013-08-15 NOTE — Discharge Instructions (Signed)
Call for a follow up appointment with a Family or Primary Care Provider for repeat testing of your kidney function. Return to your primary care or to the ED for a staple removal in 7 days. Stopped taking your Lasix twice a day. Take only 20 mg once a day as sure previously doing 4 days ago. Return if Symptoms worsen.   Take medication as prescribed.  Keep your wound clean and dry.

## 2013-08-17 LAB — URINE CULTURE: Colony Count: 5000

## 2013-08-17 NOTE — ED Provider Notes (Signed)
Medical screening examination/treatment/procedure(s) were conducted as a shared visit with non-physician practitioner(s) and myself.  I personally evaluated the patient during the encounter.   EKG Interpretation   Date/Time:  Friday August 15 2013 18:48:27 EDT Ventricular Rate:  90 PR Interval:  238 QRS Duration: 100 QT Interval:  383 QTC Calculation: 469 R Axis:   72 Text Interpretation:  Sinus rhythm Prolonged PR interval No significant  change was found Confirmed by Amauri Keefe  MD, Mckenley Birenbaum ([id number]) on 08/15/2013  6:51:57 PM      Likely vasovagal vs orthostatic. Decreased oral intake recently and increase in her lasix. Will cut her lasix in half. Laceration repaired  Hoy Morn, MD 08/17/13 [phone]

## 2013-08-23 DIAGNOSIS — R0789 Other chest pain: Secondary | ICD-10-CM

## 2013-08-23 HISTORY — DX: Other chest pain: R07.89

## 2013-08-26 ENCOUNTER — Other Ambulatory Visit: Payer: Self-pay | Admitting: *Deleted

## 2013-08-26 ENCOUNTER — Encounter: Payer: Self-pay | Admitting: Internal Medicine

## 2013-08-26 ENCOUNTER — Ambulatory Visit (INDEPENDENT_AMBULATORY_CARE_PROVIDER_SITE_OTHER): Payer: Medicare Other | Admitting: Internal Medicine

## 2013-08-26 VITALS — BP 120/78 | HR 79 | Temp 98.6°F | Wt 166.0 lb

## 2013-08-26 DIAGNOSIS — R55 Syncope and collapse: Secondary | ICD-10-CM

## 2013-08-26 MED ORDER — AMLODIPINE BESYLATE 5 MG PO TABS: 5.0000 mg | ORAL_TABLET | Freq: Every day | ORAL | Status: DC

## 2013-08-26 NOTE — Patient Instructions (Signed)
Labs are pending. To see cardiologist tomorrow.

## 2013-08-26 NOTE — Progress Notes (Signed)
   Subjective:    Patient ID: Terri Bridges, female    DOB: 26-Apr-1934, 79 y.o.   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 We recently had increased her Lasix to 40 mg daily. She denies being dehydrated. Said that she had consumed water and a glass of milk earlier in the day before going to bake pies. She also ate part of a [employer] sub sandwich with some fries and felt a bit ill just before she started today. She was evaluated in the emergency department. Her creatinine was elevated at 1.76. She was thought to be volume depleted. She had a first degree AV block of EKG. Troponin was negative. She also had a scalp laceration requiring 2 staples.  Interestingly, she really doesn't remember much about the syncopal episode. She was told she asked to go to the restroom just prior to syncope. When she came to she vomited. EMS was called and she was transported to the hospital. Think she struck her head on a table.  Is also a bit anxious because she  had knee replacement surgery and left knee steel is giving her some problems. She is scheduled for some exploratory surgery of the left knee on August 31. Initial left knee replacement was done April 2015.  She is complaining today of some chest tightness. No shortness of breath. This tightness is at rest. Not complaining of palpitations.  Used to take Cardizem prescribed by Dr. Verl Blalock in 2005 but she says she feels more energetic since that was discontinued. Does have some issues with dependent edema. She had a negative Cardiolite study read by Dr. Johnsie Cancel in 2002. Negative cardiac catheterization in 2005.  She has a history of COPD and chronic sinusitis.    Review of Systems     Objective:   Physical Exam  Small scalp laceration healed well and 2 staples removed. Neck is supple without JVD thyromegaly or bruits. Chest  clear. Cardiac exam regular rate and rhythm. Extremities without edema. No orthostasis.      Assessment & Plan:  Recent episode of syncope  First degree AV block  Complaint of chest pain-EKG done in emergency department showed no acute changes, occasional PVC, and a first degree AV block  History of COPD and chronic sinusitis  Plan: Before exploratory surgery of left knee, she basically evaluated by cardiologist. No changes in blood pressure regimen. Leave Lasix at 20 mg daily. Labs are pending today. Patient will see cardiologist tomorrow.

## 2013-08-27 ENCOUNTER — Encounter: Payer: Self-pay | Admitting: Cardiovascular Disease

## 2013-08-27 ENCOUNTER — Ambulatory Visit (INDEPENDENT_AMBULATORY_CARE_PROVIDER_SITE_OTHER)
Admission: RE | Admit: 2013-08-27 | Discharge: 2013-08-27 | Disposition: A | Discharge status: Home / Self Care | Payer: Medicare Other | Source: Ambulatory Visit | Attending: Cardiovascular Disease | Admitting: Cardiovascular Disease

## 2013-08-27 ENCOUNTER — Ambulatory Visit (INDEPENDENT_AMBULATORY_CARE_PROVIDER_SITE_OTHER): Payer: Medicare Other | Admitting: Cardiovascular Disease

## 2013-08-27 VITALS — BP 126/74 | HR 64 | Ht 65.0 in | Wt 165.0 lb

## 2013-08-27 DIAGNOSIS — R0989 Other specified symptoms and signs involving the circulatory and respiratory systems: Secondary | ICD-10-CM

## 2013-08-27 DIAGNOSIS — R0789 Other chest pain: Secondary | ICD-10-CM

## 2013-08-27 DIAGNOSIS — R0600 Dyspnea, unspecified: Secondary | ICD-10-CM

## 2013-08-27 DIAGNOSIS — R0609 Other forms of dyspnea: Secondary | ICD-10-CM | POA: Diagnosis not present

## 2013-08-27 DIAGNOSIS — R55 Syncope and collapse: Secondary | ICD-10-CM | POA: Insufficient documentation

## 2013-08-27 DIAGNOSIS — I10 Essential (primary) hypertension: Secondary | ICD-10-CM

## 2013-08-27 DIAGNOSIS — Z01810 Encounter for preprocedural cardiovascular examination: Secondary | ICD-10-CM

## 2013-08-27 DIAGNOSIS — R0602 Shortness of breath: Secondary | ICD-10-CM | POA: Diagnosis not present

## 2013-08-27 DIAGNOSIS — R079 Chest pain, unspecified: Secondary | ICD-10-CM | POA: Diagnosis not present

## 2013-08-27 LAB — CBC WITH DIFFERENTIAL/PLATELET
BASOS PCT: 1 % (ref 0–1)
Basophils Absolute: 0.1 10*3/uL (ref 0.0–0.1)
Eosinophils Absolute: 0.2 10*3/uL (ref 0.0–0.7)
Eosinophils Relative: 3 % (ref 0–5)
HCT: 39.4 % (ref 36.0–46.0)
Hemoglobin: 13.3 g/dL (ref 12.0–15.0)
LYMPHS ABS: 2.5 10*3/uL (ref 0.7–4.0)
Lymphocytes Relative: 49 % — ABNORMAL HIGH (ref 12–46)
MCH: 28.2 pg (ref 26.0–34.0)
MCHC: 33.8 g/dL (ref 30.0–36.0)
MCV: 83.5 fL (ref 78.0–100.0)
Monocytes Absolute: 0.5 10*3/uL (ref 0.1–1.0)
Monocytes Relative: 9 % (ref 3–12)
NEUTROS ABS: 1.9 10*3/uL (ref 1.7–7.7)
NEUTROS PCT: 38 % — AB (ref 43–77)
Platelets: 288 10*3/uL (ref 150–400)
RBC: 4.72 MIL/uL (ref 3.87–5.11)
RDW: 14 % (ref 11.5–15.5)
WBC: 5.1 10*3/uL (ref 4.0–10.5)

## 2013-08-27 LAB — COMPREHENSIVE METABOLIC PANEL
ALK PHOS: 95 U/L (ref 39–117)
ALT: 11 U/L (ref 0–35)
AST: 14 U/L (ref 0–37)
Albumin: 4.1 g/dL (ref 3.5–5.2)
BUN: 21 mg/dL (ref 6–23)
CO2: 28 meq/L (ref 19–32)
Calcium: 8.9 mg/dL (ref 8.4–10.5)
Chloride: 105 mEq/L (ref 96–112)
Creat: 1.2 mg/dL — ABNORMAL HIGH (ref 0.50–1.10)
Glucose, Bld: 95 mg/dL (ref 70–99)
Potassium: 4.5 mEq/L (ref 3.5–5.3)
SODIUM: 141 meq/L (ref 135–145)
TOTAL PROTEIN: 6.7 g/dL (ref 6.0–8.3)
Total Bilirubin: 0.4 mg/dL (ref 0.2–1.2)

## 2013-08-27 MED ORDER — IOHEXOL 350 MG/ML SOLN
80.0000 mL | Freq: Once | INTRAVENOUS | Status: AC | PRN
  Administered 2013-08-27: 80 mL via INTRAVENOUS

## 2013-08-27 NOTE — Assessment & Plan Note (Signed)
Continued SSCP sounds inflamatory  CXR ok  No pericardial changes on ECG  Will order lexiscan myovue to clear for surgery.  Also check CT to R/O PE given recent knee surgery syncope pleural pain With normal mediastinum aorta probably ok but will see this even non opacified on CT.  Advised her to take 400 motrin tid.

## 2013-08-27 NOTE — Patient Instructions (Addendum)
CT Angiography (CTA), is a special type of CT scan that uses a computer to produce multi-dimensional views of major blood vessels throughout the body. In CT angiography, a contrast material is injected through an IV to help visualize the blood vessels  Your physician has requested that you have an echocardiogram. Echocardiography is a painless test that uses sound waves to create images of your heart. It provides your doctor with information about the size and shape of your heart and how well your heart's chambers and valves are working. This procedure takes approximately one hour. There are no restrictions for this procedure.  Your physician has requested that you have a lexiscan myoview. For further information please visit [email or web address]. Please follow instruction sheet, as given.  Your physician recommends that you schedule a follow-up appointment at next available.

## 2013-08-27 NOTE — Assessment & Plan Note (Signed)
Well controlled.  Continue current medications and low sodium Dash type diet.    

## 2013-08-27 NOTE — Progress Notes (Signed)
Patient ID: Terri Bridges, female   DOB: [birth year], 79 y.o.   MRN: [id number]   79 yo referred by Dr Renold Genta.  She had TKR on left 4/15 and needs revision end of this month.  Has had atypical SSCP last two weeks Had syncopal episode 8/7 and seen in ER Was helping church role fried pies.  Had been on her feet a lot and had more lasix than usual last 3 days.  In ER BUN 25 and CR 1.7.  Had laceration on scalp that required staples Those removed by Dr Renold Genta this week.  Since then has had positional pain in chest with radiation to back  Worse at night when she roles side to side.  No real pleurisy.  No history of CAD  CRF HTN  Denies LE edema or calf pain.  L knee "catches" and does not have fluid motion and needs to be revised by Dr Ronnie Derby       ROS: Denies fever, malais, weight loss, blurry vision, decreased visual acuity, cough, sputum, SOB, hemoptysis, pleuritic pain, palpitaitons, heartburn, abdominal pain, melena, lower extremity edema, claudication, or rash.  All other systems reviewed and negative   General: Affect appropriate Elderly bronchitic female  HEENT: normal Neck supple with no adenopathy JVP normal no bruits no thyromegaly Lungs clear with no wheezing and good diaphragmatic motion Heart:  S1/S2 no murmur,rub, gallop or click PMI normal Abdomen: benighn, BS positve, no tenderness, no AAA no bruit.  No HSM or HJR Distal pulses intact with no bruits No edema Neuro non-focal Skin warm and dry No muscular weakness  Left TKR  LE varicosities   Medications Current Outpatient Prescriptions  Medication Sig Dispense Refill  . albuterol (PROVENTIL HFA;VENTOLIN HFA) 108 (90 BASE) MCG/ACT inhaler Inhale 2 puffs into the lungs every 6 (six) hours as needed for wheezing or shortness of breath.      . ALPRAZolam (XANAX) 0.5 MG tablet Take 0.5 mg by mouth at bedtime as needed for anxiety.      Marland Kitchen amLODipine (NORVASC) 5 MG tablet Take 1 tablet (5 mg total) by mouth daily.  30 tablet  6    . Calcium-Vitamin D (CALTRATE 600 PLUS-VIT D PO) Take 1 tablet by mouth daily.      . furosemide (LASIX) 20 MG tablet Take 1 tablet (20 mg total) by mouth daily.  30 tablet  0  . levothyroxine (SYNTHROID, LEVOTHROID) 150 MCG tablet Take 1 tablet (150 mcg total) by mouth daily before breakfast.  30 tablet  11  . losartan (COZAAR) 100 MG tablet Take 100 mg by mouth daily.      . Omega-3 Fatty Acids (FISH OIL PO) Take 1 capsule by mouth daily.      Marland Kitchen omeprazole (PRILOSEC) 20 MG capsule Take 20 mg by mouth daily.      . sertraline (ZOLOFT) 100 MG tablet Take 1 tablet (100 mg total) by mouth daily.  30 tablet  11  . Tetrahydrozoline HCl (VISINE OP) Apply 1 drop to eye daily as needed.       No current facility-administered medications for this visit.    Allergies Review of patient's allergies indicates no known allergies.  Family History: Family History  Problem Relation Age of Onset  . Heart failure Mother   . Heart failure Father     Social History: History   Social History  . Marital Status: Single    Spouse Name: N/A    Number of Children: N/A  . Years of  Education: N/A   Occupational History  . Not on file.   Social History Main Topics  . Smoking status: Former Smoker -- 1.00 packs/day for 10 years    Types: Cigarettes  . Smokeless tobacco: Never Used     Comment: quit smoking in Mar 1993  . Alcohol Use: No  . Drug Use: No  . Sexual Activity: No   Other Topics Concern  . Not on file   Social History Narrative  . No narrative on file    Electrocardiogram:  SR 90 normlal no ischemic changes or pericardial changes   Assessment and Plan

## 2013-08-27 NOTE — Assessment & Plan Note (Signed)
With head trauma in setting of standing dehydration and pre renal azotemia.  No obvious arrhythmia  or structural heart problem  Myovue and CT ordered as described above.

## 2013-08-28 NOTE — Pre-Procedure Instructions (Signed)
Zaylie Gisler Yuille  08/28/2013   Your procedure is scheduled on:  Monday  09/08/13  Report to [facility] Admitting at 730 AM.  Call this number if you have problems the morning of surgery: [phone]   Remember:   Do not eat food or drink liquids after midnight.   Take these medicines the morning of surgery with A SIP OF WATER: Synthroid,Xanax, Amlodipine,  Prilosec, and Zoloft.  Stop Aspirin , Fish oil, Vitamins ,Nsaids(Ibuprofen, Advil, Aleve, Naproxen), and herbal medications 5 days prior to surgery.   Do not wear jewelry, make-up or nail polish.  Do not wear lotions, powders, or perfumes. You may wear deodorant.  Do not shave 48 hours prior to surgery.  Do not bring valuables to the hospital.  [facility] is not responsible for any belongings or valuables.               Contacts, dentures or bridgework may not be worn into surgery.  Leave suitcase in the car. After surgery it may be brought to your room.  For patients admitted to the hospital, discharge time is determined by your  treatment team.               Patients discharged the day of surgery will not be allowed to drive home.      Special Instructions:  Special Instructions: [place] - Preparing for Surgery  Before surgery, you can play an important role.  Because skin is not sterile, your skin needs to be as free of germs as possible.  You can reduce the number of germs on you skin by washing with CHG (chlorahexidine gluconate) soap before surgery.  CHG is an antiseptic cleaner which kills germs and bonds with the skin to continue killing germs even after washing.  Please DO NOT use if you have an allergy to CHG or antibacterial soaps.  If your skin becomes reddened/irritated stop using the CHG and inform your nurse when you arrive at Short Stay.  Do not shave (including legs and underarms) for at least 48 hours prior to the first CHG shower.  You may shave your face.  Please follow these instructions  carefully:   1.  Shower with CHG Soap the night before surgery and the morning of Surgery.  2.  If you choose to wash your hair, wash your hair first as usual with your normal shampoo.  3.  After you shampoo, rinse your hair and body thoroughly to remove the Shampoo.  4.  Use CHG as you would any other liquid soap. You can apply chg directly to the skin and wash gently with scrungie or a clean washcloth.  5.  Apply the CHG Soap to your body ONLY FROM THE NECK DOWN.  Do not use on open wounds or open sores.  Avoid contact with your eyes, ears, mouth and genitals (private parts).  Wash genitals (private parts with your normal soap.  6.  Wash thoroughly, paying special attention to the area where your surgery will be performed.  7.  Thoroughly rinse your body with warm water from the neck down.  8.  DO NOT shower/wash with your normal soap after using and rinsing off the CHG Soap.  9.  Pat yourself dry with a clean towel.            10.  Wear clean pajamas.            11.  Place clean sheets on your bed the night of  your first shower and do not sleep with pets.  Day of Surgery  Do not apply any lotions/deodorants the morning of surgery.  Please wear clean clothes to the hospital/surgery center.   Please read over the following fact sheets that you were given: Pain Booklet, Coughing and Deep Breathing, Blood Transfusion Information, Total Joint Packet, MRSA Information and Surgical Site Infection Prevention

## 2013-08-29 ENCOUNTER — Encounter (HOSPITAL_COMMUNITY)
Admission: RE | Admit: 2013-08-29 | Discharge: 2013-08-29 | Disposition: A | Discharge status: Home / Self Care | Payer: Medicare Other | Source: Ambulatory Visit | Attending: Orthopedic Surgery | Admitting: Orthopedic Surgery

## 2013-08-29 ENCOUNTER — Encounter (HOSPITAL_COMMUNITY): Payer: Self-pay

## 2013-08-29 DIAGNOSIS — Z96659 Presence of unspecified artificial knee joint: Secondary | ICD-10-CM | POA: Diagnosis not present

## 2013-08-29 DIAGNOSIS — T8489XA Other specified complication of internal orthopedic prosthetic devices, implants and grafts, initial encounter: Secondary | ICD-10-CM | POA: Diagnosis not present

## 2013-08-29 DIAGNOSIS — Z01818 Encounter for other preprocedural examination: Secondary | ICD-10-CM | POA: Diagnosis not present

## 2013-08-29 DIAGNOSIS — X58XXXA Exposure to other specified factors, initial encounter: Secondary | ICD-10-CM | POA: Insufficient documentation

## 2013-08-29 DIAGNOSIS — M25569 Pain in unspecified knee: Secondary | ICD-10-CM | POA: Insufficient documentation

## 2013-08-29 DIAGNOSIS — T84029A Dislocation of unspecified internal joint prosthesis, initial encounter: Secondary | ICD-10-CM | POA: Insufficient documentation

## 2013-08-29 HISTORY — DX: Headache: R51

## 2013-08-29 HISTORY — DX: Other chest pain: R07.89

## 2013-08-29 HISTORY — DX: Cardiac murmur, unspecified: R01.1

## 2013-08-29 HISTORY — DX: Personal history of other diseases of the digestive system: Z87.19

## 2013-08-29 LAB — URINALYSIS, ROUTINE W REFLEX MICROSCOPIC
Bilirubin Urine: NEGATIVE
Glucose, UA: NEGATIVE mg/dL
Hgb urine dipstick: NEGATIVE
Ketones, ur: NEGATIVE mg/dL
Leukocytes, UA: NEGATIVE
NITRITE: NEGATIVE
PROTEIN: NEGATIVE mg/dL
SPECIFIC GRAVITY, URINE: 1.009 (ref 1.005–1.030)
UROBILINOGEN UA: 0.2 mg/dL (ref 0.0–1.0)
pH: 6 (ref 5.0–8.0)

## 2013-08-29 LAB — COMPREHENSIVE METABOLIC PANEL
ALBUMIN: 4 g/dL (ref 3.5–5.2)
ALT: 11 U/L (ref 0–35)
AST: 16 U/L (ref 0–37)
Alkaline Phosphatase: 108 U/L (ref 39–117)
Anion gap: 17 — ABNORMAL HIGH (ref 5–15)
BUN: 17 mg/dL (ref 6–23)
CALCIUM: 9.4 mg/dL (ref 8.4–10.5)
CO2: 25 mEq/L (ref 19–32)
CREATININE: 1.25 mg/dL — AB (ref 0.50–1.10)
Chloride: 101 mEq/L (ref 96–112)
GFR calc Af Amer: 47 mL/min — ABNORMAL LOW (ref >90)
GFR, EST NON AFRICAN AMERICAN: 41 mL/min — AB (ref >90)
Glucose, Bld: 96 mg/dL (ref 70–99)
Potassium: 4.2 mEq/L (ref 3.7–5.3)
Sodium: 143 mEq/L (ref 137–147)
Total Bilirubin: 0.3 mg/dL (ref 0.3–1.2)
Total Protein: 7.1 g/dL (ref 6.0–8.3)

## 2013-08-29 LAB — CBC WITH DIFFERENTIAL/PLATELET
BASOS PCT: 0 % (ref 0–1)
Basophils Absolute: 0 10*3/uL (ref 0.0–0.1)
EOS PCT: 2 % (ref 0–5)
Eosinophils Absolute: 0.1 10*3/uL (ref 0.0–0.7)
HEMATOCRIT: 42.1 % (ref 36.0–46.0)
Hemoglobin: 13.5 g/dL (ref 12.0–15.0)
Lymphocytes Relative: 19 % (ref 12–46)
Lymphs Abs: 1.4 10*3/uL (ref 0.7–4.0)
MCH: 25.8 pg — ABNORMAL LOW (ref 26.0–34.0)
MCHC: 32.1 g/dL (ref 30.0–36.0)
MCV: 80.3 fL (ref 78.0–100.0)
MONO ABS: 0.7 10*3/uL (ref 0.1–1.0)
Monocytes Relative: 9 % (ref 3–12)
Neutro Abs: 5 10*3/uL (ref 1.7–7.7)
Neutrophils Relative %: 70 % (ref 43–77)
Platelets: 213 10*3/uL (ref 150–400)
RBC: 5.24 MIL/uL — ABNORMAL HIGH (ref 3.87–5.11)
RDW: 14.5 % (ref 11.5–15.5)
WBC: 7.2 10*3/uL (ref 4.0–10.5)

## 2013-08-29 LAB — TYPE AND SCREEN
ABO/RH(D): O POS
Antibody Screen: NEGATIVE

## 2013-08-29 LAB — PROTIME-INR
INR: 0.94 (ref 0.00–1.49)
PROTHROMBIN TIME: 12.6 s (ref 11.6–15.2)

## 2013-08-29 LAB — SURGICAL PCR SCREEN
MRSA, PCR: POSITIVE — AB
Staphylococcus aureus: POSITIVE — AB

## 2013-08-29 LAB — APTT: aPTT: 27 seconds (ref 24–37)

## 2013-08-29 MED ORDER — CHLORHEXIDINE GLUCONATE 4 % EX LIQD: 60.0000 mL | Freq: Once | CUTANEOUS | Status: DC

## 2013-08-29 NOTE — Progress Notes (Signed)
Dr Ruel Favors office made aware that nasal swab tested positive for MRSA and staph. Script was called to pharmacy at [phone]. Unable to reach patient as yet,message left to call with family member.

## 2013-08-29 NOTE — Progress Notes (Signed)
Patient made aware nasal swab was positive and that script was called to her pharmacy. She verbalized instructions given.

## 2013-08-30 LAB — URINE CULTURE
COLONY COUNT: NO GROWTH
Culture: NO GROWTH

## 2013-09-01 NOTE — Progress Notes (Addendum)
Anesthesia Chart Review:  Patient is a 79 year old female scheduled for excisional TKA polyexchange on 09/08/13 by Dr. Ronnie Derby.  History includes former smoker, HTN, GERD, asthma, hiatal hernia, murmur, scoliosis, dry eyes, tinnitus, anxiety, hypothyroidism, depression, chronic low back pain, exertional dyspnea, chest pressure 08/2013 and syncope in the setting of dehydration with cardiology evaluation on 08/27/13 with planned [place] and echo (scheduled for 09/04/13) and chest CT (to rule out PE) for surgical clearance. PCP is Dr. Renold Genta.  EKG on 08/15/13 showed SR, prolonged QT.   CXR on 08/15/13 showed: No active cardiopulmonary disease. Large hiatal hernia again identified.  Chest CTA on o8/19/15 showed: No CT evidence for acute pulmonary embolus. No CT findings to explain the patient's history of chest pressure and shortness of breath. Large hiatal hernia.   Preoperative labs noted.  Cr 1.25. Urine culture showed no growth.  Plans to proceed with depend on stress and echo results scheduled for this week.  Chart will be left for follow-up.  George Hugh [facility] Short Stay Center/Anesthesiology Phone [phone] 09/01/2013 1:11 PM  Addendum:  The results from today's nuclear stress test are still pending in Epic.    Echo on 09/04/13 showed: Left ventricle: The cavity size was normal. Systolic function was normal. The estimated ejection fraction was in the range of 55% to 60%. Trivial mitral regurgitation.  I called Dr. Ruel Favors office earlier this afternoon.  He, his PA Carlynn Spry, and scheduler Marianna Fuss were all gone.  I was told to notify Linton Rump if stress test results were not back by 5PM at phone # [phone]. I have spoken with him since report is not yet available.  He states that they will follow-up on the morning of surgery to see if cardiology has cleared patient based on her stress test results.  If not, they will page Dr. Johnsie Cancel to clarify or cancel procedure if needed.    George Hugh [facility] Short Stay Center/Anesthesiology Phone [phone] 09/05/2013 5:18 PM

## 2013-09-03 ENCOUNTER — Encounter: Payer: Self-pay | Admitting: Cardiology

## 2013-09-04 ENCOUNTER — Other Ambulatory Visit (HOSPITAL_COMMUNITY): Payer: Self-pay | Admitting: Cardiovascular Disease

## 2013-09-04 ENCOUNTER — Ambulatory Visit (HOSPITAL_COMMUNITY): Payer: Medicare Other | Attending: Internal Medicine | Admitting: Radiology

## 2013-09-04 ENCOUNTER — Ambulatory Visit (HOSPITAL_BASED_OUTPATIENT_CLINIC_OR_DEPARTMENT_OTHER): Payer: Medicare Other

## 2013-09-04 DIAGNOSIS — R0609 Other forms of dyspnea: Secondary | ICD-10-CM | POA: Diagnosis not present

## 2013-09-04 DIAGNOSIS — R0789 Other chest pain: Secondary | ICD-10-CM | POA: Diagnosis not present

## 2013-09-04 DIAGNOSIS — Z01810 Encounter for preprocedural cardiovascular examination: Secondary | ICD-10-CM | POA: Diagnosis present

## 2013-09-04 DIAGNOSIS — R0989 Other specified symptoms and signs involving the circulatory and respiratory systems: Secondary | ICD-10-CM

## 2013-09-04 DIAGNOSIS — R0600 Dyspnea, unspecified: Secondary | ICD-10-CM

## 2013-09-04 DIAGNOSIS — R079 Chest pain, unspecified: Secondary | ICD-10-CM

## 2013-09-04 MED ORDER — TECHNETIUM TC 99M SESTAMIBI GENERIC - CARDIOLITE
33.0000 | Freq: Once | INTRAVENOUS | Status: AC | PRN
  Administered 2013-09-04: 33 via INTRAVENOUS

## 2013-09-04 NOTE — ED Provider Notes (Signed)
LACERATION REPAIR Performed by: Garald Balding Authorized by: Garald Balding Consent: Verbal consent obtained. Risks and benefits: risks, benefits and alternatives were discussed Consent given by: patient Patient identity confirmed: provided demographic data Prepped and Draped in normal sterile fashion Wound explored  Laceration Location: scalp  Laceration Length: 2cm  No Foreign Bodies seen or palpated  Anesthesia: local infiltration  Local anesthetic: lidocaine 1%with epinephrine  Anesthetic total: 1 ml  Irrigation method: syringe Amount of cleaning: standard  Skin closure: staple  Number of sutures: 3  Technique:  Patient tolerance: Patient tolerated the procedure well with no immediate complications.  Garald Balding, NP 09/04/13 2015

## 2013-09-04 NOTE — Progress Notes (Signed)
2D Echo completed. 09/04/2013

## 2013-09-05 ENCOUNTER — Ambulatory Visit (HOSPITAL_COMMUNITY): Payer: Medicare Other | Attending: Cardiology | Admitting: Radiology

## 2013-09-05 VITALS — BP 125/76 | HR 88 | Ht 64.0 in | Wt 166.0 lb

## 2013-09-05 DIAGNOSIS — R079 Chest pain, unspecified: Secondary | ICD-10-CM | POA: Diagnosis not present

## 2013-09-05 DIAGNOSIS — Z01810 Encounter for preprocedural cardiovascular examination: Secondary | ICD-10-CM

## 2013-09-05 DIAGNOSIS — J45909 Unspecified asthma, uncomplicated: Secondary | ICD-10-CM | POA: Insufficient documentation

## 2013-09-05 DIAGNOSIS — Z87891 Personal history of nicotine dependence: Secondary | ICD-10-CM | POA: Insufficient documentation

## 2013-09-05 MED ORDER — REGADENOSON 0.4 MG/5ML IV SOLN
0.4000 mg | Freq: Once | INTRAVENOUS | Status: AC
  Administered 2013-09-05: 0.4 mg via INTRAVENOUS

## 2013-09-05 MED ORDER — TECHNETIUM TC 99M SESTAMIBI GENERIC - CARDIOLITE
33.0000 | Freq: Once | INTRAVENOUS | Status: AC | PRN
  Administered 2013-09-05: 33 via INTRAVENOUS

## 2013-09-05 NOTE — Progress Notes (Signed)
[place] [facility] [address] [place], [place] [address] [phone]    Cardiology Nuclear Med Study  Terri Bridges is a 79 y.o. female     MRN : [id number]     DOB: 1934-08-19  Procedure Date: 09/05/2013  Nuclear Med Background Indication for Stress Test:  Evaluation for Ischemia and Surgical Clearance History:  No known CAD, Cath 2005 (normal), Asthma Cardiac Risk Factors: History of Smoking  Symptoms:  Chest Pain, Chest Pain with Exertion (last date of chest discomfort was last week), DOE and Syncope   Nuclear Pre-Procedure Caffeine/Decaff Intake:  None NPO After: 6:00 pm   Lungs:  clear O2 Sat: 98% on room air. IV 0.9% NS with Angio Cath:  22g  IV Site: R Antecubital  IV Started by:  Annye Rusk, CNMT  Chest Size (in):  42 Cup Size: C  Height: 5\' 4"  (1.626 m)  Weight:  166 lb (75.297 kg)  BMI:  Body mass index is 28.48 kg/(m^2). Tech Comments:  n/a    Nuclear Med Study 1 or 2 day study: 2 day  Stress Test Type:  Lexiscan  Reading MD: Darlin Coco, MD  Order Authorizing Provider:  Jenkins Rouge, MD  Resting Radionuclide: Technetium 15m Sestamibi  Resting Radionuclide Dose: 30.6 mCi on 09/04/13  Stress Radionuclide:  Technetium 69m Sestamibi  Stress Radionuclide Dose: 31.0 mCi on 09/05/13          Stress Protocol Rest HR: 88 Stress HR: 105  Rest BP: 125/76 Stress BP: 135/63  Exercise Time (min): n/a METS: n/a           Dose of Adenosine (mg):  n/a Dose of Lexiscan: 0.4 mg  Dose of Atropine (mg): n/a Dose of Dobutamine: n/a mcg/kg/min (at max HR)  Stress Test Technologist: Glade Lloyd, BS-ES  Nuclear Technologist:  Annye Rusk, CNMT     Rest Procedure:  Myocardial perfusion imaging was performed at rest 45 minutes following the intravenous administration of Technetium 59m Sestamibi. Rest ECG: NSR with non-specific ST-T wave changes  Stress Procedure:  The patient received IV Lexiscan 0.4 mg over 15-seconds.  Technetium 17m  Sestamibi injected at 30-seconds.  Quantitative spect images were obtained after a 45 minute delay.  During the infusion of Lexiscan the patient complained of chest discomfort and weakness.  These symptoms began to ease in recovery.  Stress ECG: No significant change from baseline ECG  QPS Raw Data Images:  Normal; no motion artifact; normal heart/lung ratio. Stress Images:  Normal homogeneous uptake in all areas of the myocardium. Rest Images:  Normal homogeneous uptake in all areas of the myocardium. Subtraction (SDS):  No evidence of ischemia. Transient Ischemic Dilatation (Normal <1.22):  0.87 Lung/Heart Ratio (Normal <0.45):  0.32  Quantitative Gated Spect Images QGS EDV:  89 ml QGS ESV:  28 ml  Impression Exercise Capacity:  Lexiscan with no exercise. BP Response:  Normal blood pressure response. Clinical Symptoms:  Mild chest pain/dyspnea. ECG Impression:  No significant ST segment change suggestive of ischemia. Comparison with Prior Nuclear Study: No images to compare  Overall Impression:  Normal stress nuclear study.  LV Ejection Fraction: 68%.  LV Wall Motion:  NL LV Function; NL Wall Motion  .Darlin Coco MD

## 2013-09-07 MED ORDER — CHLORHEXIDINE GLUCONATE 4 % EX LIQD
60.0000 mL | Freq: Once | CUTANEOUS | Status: DC
  Filled 2013-09-07: qty 60

## 2013-09-07 MED ORDER — TRANEXAMIC ACID 100 MG/ML IV SOLN
1000.0000 mg | INTRAVENOUS | Status: AC
  Administered 2013-09-08: 1000 mg via INTRAVENOUS
  Filled 2013-09-07: qty 10

## 2013-09-07 MED ORDER — CEFAZOLIN SODIUM-DEXTROSE 2-3 GM-% IV SOLR
2.0000 g | INTRAVENOUS | Status: AC
  Administered 2013-09-08: 2 g via INTRAVENOUS
  Filled 2013-09-07: qty 50

## 2013-09-08 ENCOUNTER — Inpatient Hospital Stay (HOSPITAL_COMMUNITY): Payer: Medicare Other | Admitting: Anesthesiology

## 2013-09-08 ENCOUNTER — Inpatient Hospital Stay (HOSPITAL_COMMUNITY)
Admission: RE | Admit: 2013-09-08 | Discharge: 2013-09-09 | DRG: 488 | Disposition: A | Discharge status: Home Health | Payer: Medicare Other | Source: Ambulatory Visit | Attending: Orthopedic Surgery | Admitting: Orthopedic Surgery

## 2013-09-08 ENCOUNTER — Encounter (HOSPITAL_COMMUNITY): Payer: Medicare Other | Admitting: Vascular Surgery

## 2013-09-08 ENCOUNTER — Encounter (HOSPITAL_COMMUNITY): Payer: Self-pay | Admitting: *Deleted

## 2013-09-08 ENCOUNTER — Encounter (HOSPITAL_COMMUNITY)
Admission: RE | Disposition: A | Discharge status: Home Health | Payer: Self-pay | Source: Ambulatory Visit | Attending: Orthopedic Surgery

## 2013-09-08 DIAGNOSIS — T84099A Other mechanical complication of unspecified internal joint prosthesis, initial encounter: Principal | ICD-10-CM | POA: Diagnosis present

## 2013-09-08 DIAGNOSIS — F3289 Other specified depressive episodes: Secondary | ICD-10-CM | POA: Diagnosis present

## 2013-09-08 DIAGNOSIS — I10 Essential (primary) hypertension: Secondary | ICD-10-CM | POA: Diagnosis present

## 2013-09-08 DIAGNOSIS — F411 Generalized anxiety disorder: Secondary | ICD-10-CM | POA: Diagnosis present

## 2013-09-08 DIAGNOSIS — Z96659 Presence of unspecified artificial knee joint: Secondary | ICD-10-CM

## 2013-09-08 DIAGNOSIS — J4489 Other specified chronic obstructive pulmonary disease: Secondary | ICD-10-CM | POA: Diagnosis present

## 2013-09-08 DIAGNOSIS — D62 Acute posthemorrhagic anemia: Secondary | ICD-10-CM | POA: Diagnosis not present

## 2013-09-08 DIAGNOSIS — K219 Gastro-esophageal reflux disease without esophagitis: Secondary | ICD-10-CM | POA: Diagnosis present

## 2013-09-08 DIAGNOSIS — M25569 Pain in unspecified knee: Secondary | ICD-10-CM | POA: Diagnosis present

## 2013-09-08 DIAGNOSIS — M25562 Pain in left knee: Secondary | ICD-10-CM | POA: Diagnosis present

## 2013-09-08 DIAGNOSIS — E039 Hypothyroidism, unspecified: Secondary | ICD-10-CM | POA: Diagnosis present

## 2013-09-08 DIAGNOSIS — J449 Chronic obstructive pulmonary disease, unspecified: Secondary | ICD-10-CM | POA: Diagnosis present

## 2013-09-08 DIAGNOSIS — T84019A Broken internal joint prosthesis, unspecified site, initial encounter: Secondary | ICD-10-CM | POA: Diagnosis not present

## 2013-09-08 DIAGNOSIS — F329 Major depressive disorder, single episode, unspecified: Secondary | ICD-10-CM | POA: Diagnosis present

## 2013-09-08 DIAGNOSIS — G8918 Other acute postprocedural pain: Secondary | ICD-10-CM | POA: Diagnosis not present

## 2013-09-08 DIAGNOSIS — T8489XA Other specified complication of internal orthopedic prosthetic devices, implants and grafts, initial encounter: Secondary | ICD-10-CM | POA: Diagnosis not present

## 2013-09-08 DIAGNOSIS — Y831 Surgical operation with implant of artificial internal device as the cause of abnormal reaction of the patient, or of later complication, without mention of misadventure at the time of the procedure: Secondary | ICD-10-CM | POA: Diagnosis present

## 2013-09-08 HISTORY — PX: EXCISIONAL TOTAL KNEE ARTHROPLASTY: SHX5015

## 2013-09-08 LAB — CREATININE, SERUM
CREATININE: 1.05 mg/dL (ref 0.50–1.10)
GFR calc Af Amer: 58 mL/min — ABNORMAL LOW (ref >90)
GFR calc non Af Amer: 50 mL/min — ABNORMAL LOW (ref >90)

## 2013-09-08 LAB — CBC
HCT: 35.7 % — ABNORMAL LOW (ref 36.0–46.0)
Hemoglobin: 11.9 g/dL — ABNORMAL LOW (ref 12.0–15.0)
MCH: 26 pg (ref 26.0–34.0)
MCHC: 33.3 g/dL (ref 30.0–36.0)
MCV: 78.1 fL (ref 78.0–100.0)
Platelets: 145 10*3/uL — ABNORMAL LOW (ref 150–400)
RBC: 4.57 MIL/uL (ref 3.87–5.11)
RDW: 14.3 % (ref 11.5–15.5)
WBC: 5.6 10*3/uL (ref 4.0–10.5)

## 2013-09-08 SURGERY — EXCISIONAL TOTAL KNEE ARTHROPLASTY
Anesthesia: General | Site: Knee | Laterality: Left

## 2013-09-08 MED ORDER — EPHEDRINE SULFATE 50 MG/ML IJ SOLN
INTRAMUSCULAR | Status: DC | PRN
  Administered 2013-09-08 (×2): 10 mg via INTRAVENOUS

## 2013-09-08 MED ORDER — LACTATED RINGERS IV SOLN
INTRAVENOUS | Status: DC
  Administered 2013-09-08: 09:00:00 via INTRAVENOUS

## 2013-09-08 MED ORDER — SODIUM CHLORIDE 0.9 % IR SOLN
Status: DC | PRN
  Administered 2013-09-08: 1000 mL

## 2013-09-08 MED ORDER — OXYCODONE HCL 5 MG/5ML PO SOLN: 5.0000 mg | Freq: Once | ORAL | Status: DC | PRN

## 2013-09-08 MED ORDER — LACTATED RINGERS IV SOLN
INTRAVENOUS | Status: DC | PRN
  Administered 2013-09-08 (×2): via INTRAVENOUS

## 2013-09-08 MED ORDER — HYDROMORPHONE HCL PF 1 MG/ML IJ SOLN
1.0000 mg | INTRAMUSCULAR | Status: DC | PRN
  Administered 2013-09-08: 1 mg via INTRAVENOUS
  Filled 2013-09-08: qty 1

## 2013-09-08 MED ORDER — METHOCARBAMOL 1000 MG/10ML IJ SOLN: 500.0000 mg | Freq: Four times a day (QID) | INTRAMUSCULAR | Status: DC | PRN

## 2013-09-08 MED ORDER — OXYCODONE HCL 5 MG PO TABS: 5.0000 mg | ORAL_TABLET | Freq: Once | ORAL | Status: DC | PRN

## 2013-09-08 MED ORDER — LIDOCAINE HCL (CARDIAC) 20 MG/ML IV SOLN
INTRAVENOUS | Status: AC
  Filled 2013-09-08: qty 5

## 2013-09-08 MED ORDER — MEPERIDINE HCL 25 MG/ML IJ SOLN: 6.2500 mg | INTRAMUSCULAR | Status: DC | PRN

## 2013-09-08 MED ORDER — DEXAMETHASONE SODIUM PHOSPHATE 10 MG/ML IJ SOLN
INTRAMUSCULAR | Status: DC | PRN
  Administered 2013-09-08: 4 mg via INTRAVENOUS

## 2013-09-08 MED ORDER — AMLODIPINE BESYLATE 5 MG PO TABS
5.0000 mg | ORAL_TABLET | Freq: Every day | ORAL | Status: DC
  Filled 2013-09-08: qty 1

## 2013-09-08 MED ORDER — ALUM & MAG HYDROXIDE-SIMETH 200-200-20 MG/5ML PO SUSP: 30.0000 mL | ORAL | Status: DC | PRN

## 2013-09-08 MED ORDER — FENTANYL CITRATE 0.05 MG/ML IJ SOLN
INTRAMUSCULAR | Status: AC
  Filled 2013-09-08: qty 5

## 2013-09-08 MED ORDER — FLEET ENEMA 7-19 GM/118ML RE ENEM
1.0000 | ENEMA | Freq: Once | RECTAL | Status: AC | PRN
Start: 2013-09-08 — End: 2013-09-08

## 2013-09-08 MED ORDER — BUPIVACAINE LIPOSOME 1.3 % IJ SUSP
20.0000 mL | Freq: Once | INTRAMUSCULAR | Status: DC
Start: 2013-09-08 — End: 2013-09-08
  Filled 2013-09-08: qty 20

## 2013-09-08 MED ORDER — FUROSEMIDE 20 MG PO TABS
20.0000 mg | ORAL_TABLET | Freq: Every day | ORAL | Status: DC
  Administered 2013-09-08 – 2013-09-09 (×2): 20 mg via ORAL
  Filled 2013-09-08 (×2): qty 1

## 2013-09-08 MED ORDER — PHENYLEPHRINE HCL 10 MG/ML IJ SOLN
INTRAMUSCULAR | Status: DC | PRN
  Administered 2013-09-08: 80 ug via INTRAVENOUS
  Administered 2013-09-08 (×2): 40 ug via INTRAVENOUS
  Administered 2013-09-08: 80 ug via INTRAVENOUS
  Administered 2013-09-08 (×2): 40 ug via INTRAVENOUS
  Administered 2013-09-08: 80 ug via INTRAVENOUS

## 2013-09-08 MED ORDER — ENOXAPARIN SODIUM 30 MG/0.3ML ~~LOC~~ SOLN
30.0000 mg | Freq: Two times a day (BID) | SUBCUTANEOUS | Status: DC
Start: 2013-09-09 — End: 2013-09-09
  Administered 2013-09-09: 30 mg via SUBCUTANEOUS
  Filled 2013-09-08 (×4): qty 0.3

## 2013-09-08 MED ORDER — MIDAZOLAM HCL 2 MG/2ML IJ SOLN
INTRAMUSCULAR | Status: AC
  Administered 2013-09-08: 2 mg
  Filled 2013-09-08: qty 2

## 2013-09-08 MED ORDER — MIDAZOLAM HCL 2 MG/2ML IJ SOLN: 2.0000 mg | Freq: Once | INTRAMUSCULAR | Status: DC

## 2013-09-08 MED ORDER — PROPOFOL 10 MG/ML IV BOLUS
INTRAVENOUS | Status: DC | PRN
  Administered 2013-09-08: 150 mg via INTRAVENOUS

## 2013-09-08 MED ORDER — FENTANYL CITRATE 0.05 MG/ML IJ SOLN
INTRAMUSCULAR | Status: AC
  Filled 2013-09-08: qty 2

## 2013-09-08 MED ORDER — DIPHENHYDRAMINE HCL 12.5 MG/5ML PO ELIX: 12.5000 mg | ORAL_SOLUTION | ORAL | Status: DC | PRN

## 2013-09-08 MED ORDER — BUPIVACAINE LIPOSOME 1.3 % IJ SUSP
INTRAMUSCULAR | Status: DC | PRN
  Administered 2013-09-08: 20 mL

## 2013-09-08 MED ORDER — ZOLPIDEM TARTRATE 5 MG PO TABS: 5.0000 mg | ORAL_TABLET | Freq: Every evening | ORAL | Status: DC | PRN

## 2013-09-08 MED ORDER — LEVOTHYROXINE SODIUM 150 MCG PO TABS
150.0000 ug | ORAL_TABLET | Freq: Every day | ORAL | Status: DC
  Administered 2013-09-09: 150 ug via ORAL
  Filled 2013-09-08 (×2): qty 1

## 2013-09-08 MED ORDER — ACETAMINOPHEN 325 MG PO TABS: 650.0000 mg | ORAL_TABLET | Freq: Four times a day (QID) | ORAL | Status: DC | PRN

## 2013-09-08 MED ORDER — FENTANYL CITRATE 0.05 MG/ML IJ SOLN
50.0000 ug | Freq: Once | INTRAMUSCULAR | Status: AC
  Administered 2013-09-08: 50 ug via INTRAVENOUS

## 2013-09-08 MED ORDER — SENNOSIDES-DOCUSATE SODIUM 8.6-50 MG PO TABS: 1.0000 | ORAL_TABLET | Freq: Every evening | ORAL | Status: DC | PRN

## 2013-09-08 MED ORDER — DEXAMETHASONE SODIUM PHOSPHATE 4 MG/ML IJ SOLN
INTRAMUSCULAR | Status: AC
  Filled 2013-09-08: qty 1

## 2013-09-08 MED ORDER — BUPIVACAINE-EPINEPHRINE (PF) 0.5% -1:200000 IJ SOLN
INTRAMUSCULAR | Status: DC | PRN
  Administered 2013-09-08: 30 mL

## 2013-09-08 MED ORDER — DOCUSATE SODIUM 100 MG PO CAPS
100.0000 mg | ORAL_CAPSULE | Freq: Two times a day (BID) | ORAL | Status: DC
  Administered 2013-09-08 – 2013-09-09 (×2): 100 mg via ORAL
  Filled 2013-09-08 (×3): qty 1

## 2013-09-08 MED ORDER — PANTOPRAZOLE SODIUM 40 MG PO TBEC
80.0000 mg | DELAYED_RELEASE_TABLET | Freq: Every day | ORAL | Status: DC
  Administered 2013-09-09: 80 mg via ORAL
  Filled 2013-09-08 (×2): qty 2

## 2013-09-08 MED ORDER — OXYCODONE HCL 5 MG PO TABS
5.0000 mg | ORAL_TABLET | ORAL | Status: DC | PRN
  Administered 2013-09-09 (×3): 10 mg via ORAL
  Filled 2013-09-08 (×3): qty 2

## 2013-09-08 MED ORDER — ONDANSETRON HCL 4 MG/2ML IJ SOLN
INTRAMUSCULAR | Status: DC | PRN
  Administered 2013-09-08: 4 mg via INTRAVENOUS

## 2013-09-08 MED ORDER — ALBUTEROL SULFATE HFA 108 (90 BASE) MCG/ACT IN AERS: 2.0000 | INHALATION_SPRAY | Freq: Four times a day (QID) | RESPIRATORY_TRACT | Status: DC | PRN

## 2013-09-08 MED ORDER — SODIUM CHLORIDE 0.9 % IV SOLN
INTRAVENOUS | Status: DC
  Administered 2013-09-08 – 2013-09-09 (×2): via INTRAVENOUS

## 2013-09-08 MED ORDER — LOSARTAN POTASSIUM 50 MG PO TABS
100.0000 mg | ORAL_TABLET | Freq: Every day | ORAL | Status: DC
  Administered 2013-09-08: 100 mg via ORAL
  Filled 2013-09-08 (×2): qty 2

## 2013-09-08 MED ORDER — BUPIVACAINE HCL (PF) 0.25 % IJ SOLN
INTRAMUSCULAR | Status: AC
  Filled 2013-09-08: qty 30

## 2013-09-08 MED ORDER — ALBUTEROL SULFATE (2.5 MG/3ML) 0.083% IN NEBU: 2.5000 mg | INHALATION_SOLUTION | Freq: Four times a day (QID) | RESPIRATORY_TRACT | Status: DC | PRN

## 2013-09-08 MED ORDER — PROPOFOL 10 MG/ML IV BOLUS
INTRAVENOUS | Status: AC
  Filled 2013-09-08: qty 20

## 2013-09-08 MED ORDER — BISACODYL 5 MG PO TBEC: 5.0000 mg | DELAYED_RELEASE_TABLET | Freq: Every day | ORAL | Status: DC | PRN

## 2013-09-08 MED ORDER — FENTANYL CITRATE 0.05 MG/ML IJ SOLN
INTRAMUSCULAR | Status: DC | PRN
  Administered 2013-09-08 (×2): 25 ug via INTRAVENOUS
  Administered 2013-09-08: 50 ug via INTRAVENOUS
  Administered 2013-09-08 (×2): 25 ug via INTRAVENOUS

## 2013-09-08 MED ORDER — ONDANSETRON HCL 4 MG PO TABS: 4.0000 mg | ORAL_TABLET | Freq: Four times a day (QID) | ORAL | Status: DC | PRN

## 2013-09-08 MED ORDER — OXYCODONE HCL ER 10 MG PO T12A
10.0000 mg | EXTENDED_RELEASE_TABLET | Freq: Two times a day (BID) | ORAL | Status: DC
Start: 2013-09-08 — End: 2013-09-09
  Administered 2013-09-08 – 2013-09-09 (×2): 10 mg via ORAL
  Filled 2013-09-08 (×3): qty 1

## 2013-09-08 MED ORDER — METHOCARBAMOL 500 MG PO TABS: 500.0000 mg | ORAL_TABLET | Freq: Four times a day (QID) | ORAL | Status: DC | PRN

## 2013-09-08 MED ORDER — HYDROMORPHONE HCL PF 1 MG/ML IJ SOLN
0.2500 mg | INTRAMUSCULAR | Status: DC | PRN
  Administered 2013-09-08: 0.25 mg via INTRAVENOUS
  Administered 2013-09-08: 0.5 mg via INTRAVENOUS
  Administered 2013-09-08: 0.25 mg via INTRAVENOUS

## 2013-09-08 MED ORDER — CELECOXIB 200 MG PO CAPS
200.0000 mg | ORAL_CAPSULE | Freq: Two times a day (BID) | ORAL | Status: DC
  Administered 2013-09-09 (×2): 200 mg via ORAL
  Filled 2013-09-08 (×5): qty 1

## 2013-09-08 MED ORDER — BUPIVACAINE-EPINEPHRINE (PF) 0.25% -1:200000 IJ SOLN
INTRAMUSCULAR | Status: AC
  Filled 2013-09-08: qty 30

## 2013-09-08 MED ORDER — MENTHOL 3 MG MT LOZG: 1.0000 | LOZENGE | OROMUCOSAL | Status: DC | PRN

## 2013-09-08 MED ORDER — BUPIVACAINE-EPINEPHRINE (PF) 0.5% -1:200000 IJ SOLN
INTRAMUSCULAR | Status: AC
  Filled 2013-09-08: qty 30

## 2013-09-08 MED ORDER — PHENYLEPHRINE 40 MCG/ML (10ML) SYRINGE FOR IV PUSH (FOR BLOOD PRESSURE SUPPORT)
PREFILLED_SYRINGE | INTRAVENOUS | Status: AC
  Filled 2013-09-08: qty 10

## 2013-09-08 MED ORDER — METOCLOPRAMIDE HCL 10 MG PO TABS: 5.0000 mg | ORAL_TABLET | Freq: Three times a day (TID) | ORAL | Status: DC | PRN

## 2013-09-08 MED ORDER — HYDROMORPHONE HCL PF 1 MG/ML IJ SOLN
INTRAMUSCULAR | Status: AC
  Filled 2013-09-08: qty 1

## 2013-09-08 MED ORDER — CEFAZOLIN SODIUM 1-5 GM-% IV SOLN
1.0000 g | Freq: Four times a day (QID) | INTRAVENOUS | Status: AC
  Administered 2013-09-08 (×2): 1 g via INTRAVENOUS
  Filled 2013-09-08 (×2): qty 50

## 2013-09-08 MED ORDER — SERTRALINE HCL 100 MG PO TABS
100.0000 mg | ORAL_TABLET | Freq: Every day | ORAL | Status: DC
  Administered 2013-09-09: 100 mg via ORAL
  Filled 2013-09-08 (×2): qty 1

## 2013-09-08 MED ORDER — ARTIFICIAL TEARS OP OINT
TOPICAL_OINTMENT | OPHTHALMIC | Status: DC | PRN
  Administered 2013-09-08: 1 via OPHTHALMIC

## 2013-09-08 MED ORDER — ALPRAZOLAM 0.5 MG PO TABS: 0.5000 mg | ORAL_TABLET | Freq: Every evening | ORAL | Status: DC | PRN

## 2013-09-08 MED ORDER — ONDANSETRON HCL 4 MG/2ML IJ SOLN
4.0000 mg | Freq: Four times a day (QID) | INTRAMUSCULAR | Status: DC | PRN
Start: 2013-09-08 — End: 2013-09-09

## 2013-09-08 MED ORDER — LIDOCAINE HCL 1 % IJ SOLN
INTRAMUSCULAR | Status: DC | PRN
  Administered 2013-09-08: 100 mg via INTRADERMAL

## 2013-09-08 MED ORDER — ROCURONIUM BROMIDE 50 MG/5ML IV SOLN
INTRAVENOUS | Status: AC
  Filled 2013-09-08: qty 1

## 2013-09-08 MED ORDER — PHENOL 1.4 % MT LIQD: 1.0000 | OROMUCOSAL | Status: DC | PRN

## 2013-09-08 MED ORDER — SODIUM CHLORIDE 0.9 % IV SOLN: INTRAVENOUS | Status: DC

## 2013-09-08 MED ORDER — ACETAMINOPHEN 650 MG RE SUPP: 650.0000 mg | Freq: Four times a day (QID) | RECTAL | Status: DC | PRN

## 2013-09-08 MED ORDER — METOCLOPRAMIDE HCL 5 MG/ML IJ SOLN: 5.0000 mg | Freq: Three times a day (TID) | INTRAMUSCULAR | Status: DC | PRN

## 2013-09-08 MED ORDER — ONDANSETRON HCL 4 MG/2ML IJ SOLN: 4.0000 mg | Freq: Once | INTRAMUSCULAR | Status: DC | PRN

## 2013-09-08 MED ORDER — 0.9 % SODIUM CHLORIDE (POUR BTL) OPTIME
TOPICAL | Status: DC | PRN
  Administered 2013-09-08: 1000 mL

## 2013-09-08 SURGICAL SUPPLY — 67 items
ARTICULAR SURFACE 14 PS (Knees) ×1 IMPLANT
ARTICULAR SURFACE 14MM PS (Knees) ×1 IMPLANT
BAG URINE DRAINAGE (UROLOGICAL SUPPLIES) ×1 IMPLANT
BANDAGE ELASTIC 6 VELCRO ST LF (GAUZE/BANDAGES/DRESSINGS) ×2 IMPLANT
BANDAGE ESMARK 6X9 LF (GAUZE/BANDAGES/DRESSINGS) ×1 IMPLANT
BLADE SAW SGTL 83.5X18.5 (BLADE) ×3 IMPLANT
BLADE SAW SGTL NAR THIN XSHT (BLADE) ×3 IMPLANT
BNDG CMPR 9X6 STRL LF SNTH (GAUZE/BANDAGES/DRESSINGS) ×1
BNDG ESMARK 6X9 LF (GAUZE/BANDAGES/DRESSINGS) ×3
BOWL SMART MIX CTS (DISPOSABLE) ×1 IMPLANT
CONT SPEC 4OZ CLIKSEAL STRL BL (MISCELLANEOUS) ×2 IMPLANT
COVER BACK TABLE 24X17X13 BIG (DRAPES) IMPLANT
COVER SURGICAL LIGHT HANDLE (MISCELLANEOUS) ×3 IMPLANT
CUFF TOURNIQUET SINGLE 34IN LL (TOURNIQUET CUFF) ×2 IMPLANT
DRAPE EXTREMITY T 121X128X90 (DRAPE) ×3 IMPLANT
DRAPE INCISE IOBAN 66X45 STRL (DRAPES) ×6 IMPLANT
DRAPE PROXIMA HALF (DRAPES) ×3 IMPLANT
DRAPE U-SHAPE 47X51 STRL (DRAPES) ×3 IMPLANT
DRSG ADAPTIC 3X8 NADH LF (GAUZE/BANDAGES/DRESSINGS) ×3 IMPLANT
DRSG PAD ABDOMINAL 8X10 ST (GAUZE/BANDAGES/DRESSINGS) ×3 IMPLANT
DURAPREP 26ML APPLICATOR (WOUND CARE) ×6 IMPLANT
ELECT REM PT RETURN 9FT ADLT (ELECTROSURGICAL) ×3
ELECTRODE REM PT RTRN 9FT ADLT (ELECTROSURGICAL) ×1 IMPLANT
EVACUATOR 1/8 PVC DRAIN (DRAIN) ×1 IMPLANT
GAUZE SPONGE 4X4 12PLY STRL (GAUZE/BANDAGES/DRESSINGS) ×3 IMPLANT
GEL ULTRASOUND 20GR AQUASONIC (MISCELLANEOUS) ×1 IMPLANT
GLOVE BIOGEL PI IND STRL 7.5 (GLOVE) IMPLANT
GLOVE BIOGEL PI IND STRL 8.5 (GLOVE) ×2 IMPLANT
GLOVE BIOGEL PI INDICATOR 7.5 (GLOVE)
GLOVE BIOGEL PI INDICATOR 8.5 (GLOVE) ×4
GLOVE SURG ORTHO 7.0 STRL STRW (GLOVE) IMPLANT
GLOVE SURG ORTHO 8.0 STRL STRW (GLOVE) ×6 IMPLANT
GOWN STRL REUS W/ TWL LRG LVL3 (GOWN DISPOSABLE) ×2 IMPLANT
GOWN STRL REUS W/ TWL XL LVL3 (GOWN DISPOSABLE) ×2 IMPLANT
GOWN STRL REUS W/TWL LRG LVL3 (GOWN DISPOSABLE) ×6
GOWN STRL REUS W/TWL XL LVL3 (GOWN DISPOSABLE) ×9
HANDPIECE INTERPULSE COAX TIP (DISPOSABLE) ×3
HOOD PEEL AWAY FACE SHEILD DIS (HOOD) ×13 IMPLANT
KIT BASIN OR (CUSTOM PROCEDURE TRAY) ×3 IMPLANT
KIT ROOM TURNOVER OR (KITS) ×3 IMPLANT
MANIFOLD NEPTUNE II (INSTRUMENTS) ×3 IMPLANT
NDL 18GX1X1/2 (RX/OR ONLY) (NEEDLE) IMPLANT
NDL HYPO 25GX1X1/2 BEV (NEEDLE) ×1 IMPLANT
NEEDLE 18GX1X1/2 (RX/OR ONLY) (NEEDLE) IMPLANT
NEEDLE HYPO 25GX1X1/2 BEV (NEEDLE) IMPLANT
NS IRRIG 1000ML POUR BTL (IV SOLUTION) ×1 IMPLANT
PACK TOTAL JOINT (CUSTOM PROCEDURE TRAY) ×3 IMPLANT
PAD ARMBOARD 7.5X6 YLW CONV (MISCELLANEOUS) ×6 IMPLANT
PADDING CAST COTTON 6X4 STRL (CAST SUPPLIES) ×3 IMPLANT
SET HNDPC FAN SPRY TIP SCT (DISPOSABLE) ×1 IMPLANT
SPONGE GAUZE 4X4 12PLY STER LF (GAUZE/BANDAGES/DRESSINGS) ×2 IMPLANT
STAPLER VISISTAT 35W (STAPLE) ×3 IMPLANT
SUCTION FRAZIER TIP 10 FR DISP (SUCTIONS) ×1 IMPLANT
SUT VIC AB 0 CTB1 27 (SUTURE) ×6 IMPLANT
SUT VIC AB 1 CT1 27 (SUTURE) ×9
SUT VIC AB 1 CT1 27XBRD ANBCTR (SUTURE) ×4 IMPLANT
SUT VIC AB 2-0 CT1 27 (SUTURE) ×6
SUT VIC AB 2-0 CT1 TAPERPNT 27 (SUTURE) ×2 IMPLANT
SYR 20CC LL (SYRINGE) ×2 IMPLANT
SYR 20ML ECCENTRIC (SYRINGE) IMPLANT
SYR CONTROL 10ML LL (SYRINGE) ×5 IMPLANT
SYRINGE 10CC LL (SYRINGE) ×1 IMPLANT
TOWEL OR 17X24 6PK STRL BLUE (TOWEL DISPOSABLE) ×3 IMPLANT
TOWEL OR 17X26 10 PK STRL BLUE (TOWEL DISPOSABLE) ×3 IMPLANT
TRAY FOLEY CATH 16FRSI W/METER (SET/KITS/TRAYS/PACK) ×1 IMPLANT
TUBE ANAEROBIC SPECIMEN COL (MISCELLANEOUS) IMPLANT
WATER STERILE IRR 1000ML POUR (IV SOLUTION) ×3 IMPLANT

## 2013-09-08 NOTE — Anesthesia Postprocedure Evaluation (Signed)
Anesthesia Post Note  Patient: Terri Bridges  Procedure(s) Performed: Procedure(s) (LRB): EXCISIONAL TOTAL KNEE ARTHROPLASTY POLYEXCHANGE (Left)  Anesthesia type: general  Patient location: PACU  Post pain: Pain level controlled  Post assessment: Patient's Cardiovascular Status Stable  Last Vitals:  Filed Vitals:   09/08/13 1341  BP: 132/63  Pulse: 95  Temp: 36.6 C  Resp: 16    Post vital signs: Reviewed and stable  Level of consciousness: sedated  Complications: No apparent anesthesia complications

## 2013-09-08 NOTE — Anesthesia Procedure Notes (Addendum)
Anesthesia Regional Block:  Adductor canal block  Pre-Anesthetic Checklist: ,, timeout performed, Correct Patient, Correct Site, Correct Laterality, Correct Procedure, Correct Position, site marked, Risks and benefits discussed,  Surgical consent,  Pre-op evaluation,  At surgeon's request and post-op pain management  Laterality: Left  Prep: chloraprep       Needles:  Injection technique: Single-shot  Needle Type: Echogenic Stimulator Needle     Needle Length: 9cm 9 cm Needle Gauge: 21 and 21 G    Additional Needles:  Procedures: ultrasound guided (picture in chart) Adductor canal block Narrative:  Start time: 09/08/2013 9:35 AM End time: 09/08/2013 9:50 AM Injection made incrementally with aspirations every 5 mL.  Performed by: Personally  Anesthesiologist: Lillia Abed MD  Additional Notes: Monitors applied. Patient sedated. Sterile prep and drape,hand hygiene and sterile gloves were used. Relevant anatomy identified.Needle position confirmed.Local anesthetic injected incrementally after negative aspiration. Local anesthetic spread visualized around nerve(s). Vascular puncture avoided. No complications. Image printed for medical record.The patient tolerated the procedure well.    Lillia Abed MD   Procedure Name: LMA Insertion Date/Time: 09/08/2013 10:32 AM Performed by: Ned Grace Pre-anesthesia Checklist: Patient identified, Patient being monitored, Emergency Drugs available, Timeout performed and Suction available Patient Re-evaluated:Patient Re-evaluated prior to inductionOxygen Delivery Method: Circle system utilized Preoxygenation: Pre-oxygenation with 100% oxygen Intubation Type: IV induction LMA: LMA inserted LMA Size: 4.0 Number of attempts: 1 Placement Confirmation: breath sounds checked- equal and bilateral and positive ETCO2 Tube secured with: Tape Dental Injury: Teeth and Oropharynx as per pre-operative assessment

## 2013-09-08 NOTE — Evaluation (Signed)
Physical Therapy Evaluation Patient Details Name: Terri Bridges MRN: [id number] DOB: [birth year] Today's Date: 09/08/2013   History of Present Illness  79 y.o. female s/p left EXCISIONAL TOTAL KNEE ARTHROPLASTY POLYEXCHANGE. Hx of HTN, COPD, Lt TKA on 04/21/2013, and syncope.  Clinical Impression  Patient is seen following the above procedure, presenting with functional limitations due to the deficits listed below (see PT Problem List). Able to tolerate 2 short bouts of ambulation and therapeutic exercises post op day #0. Anticipate patient will progress nicely towards physical therapy goals and will have 24 hour care from family upon d/c home. Patient will benefit from skilled PT to increase their independence and safety with mobility to allow discharge to the venue listed below.       Follow Up Recommendations Home health PT;Supervision for mobility/OOB    Equipment Recommendations  None recommended by PT    Recommendations for Other Services OT consult     Precautions / Restrictions Precautions Precautions: Knee Precaution Comments: Reviewed knee precautions Restrictions Weight Bearing Restrictions: Yes LLE Weight Bearing: Weight bearing as tolerated      Mobility  Bed Mobility Overal bed mobility: Modified Independent                Transfers Overall transfer level: Needs assistance Equipment used: Rolling walker (2 wheeled) Transfers: Sit to/from Stand Sit to Stand: Min guard         General transfer comment: Min guard for safety. VC for hand placement. Performed from lowest bed setting and BSC. Good control with desent to sit.  Ambulation/Gait Ambulation/Gait assistance: Min guard Ambulation Distance (Feet): 15 Feet (x2) Assistive device: Rolling walker (2 wheeled) Gait Pattern/deviations: Step-to pattern;Step-through pattern;Decreased step length - right;Decreased stance time - left;Antalgic   Gait velocity interpretation: Below normal speed for  age/gender General Gait Details: Pre-grait activity with weight shifting for load acceptance through Lt knee. Educated on safe DME use. VC for sequencing and walker placement. No instances of buckling during bouts.   Stairs            Wheelchair Mobility    Modified Rankin (Stroke Patients Only)       Balance Overall balance assessment: Needs assistance Sitting-balance support: No upper extremity supported;Feet supported Sitting balance-Leahy Scale: Good     Standing balance support: No upper extremity supported Standing balance-Leahy Scale: Fair                               Pertinent Vitals/Pain Pain Assessment: No/denies pain    Home Living Family/patient expects to be discharged to:: Private residence Living Arrangements: Other relatives Available Help at Discharge: Family;Available 24 hours/day Type of Home: Mobile home Home Access: Ramped entrance     Home Layout: One level Home Equipment: [place] - 2 wheels (3-in-1 commode)      Prior Function Level of Independence: Independent               Hand Dominance   Dominant Hand: Right    Extremity/Trunk Assessment   Upper Extremity Assessment: Defer to OT evaluation           Lower Extremity Assessment: LLE deficits/detail   LLE Deficits / Details: Decreased strength and ROM as expected post op     Communication   Communication: No difficulties  Cognition Arousal/Alertness: Lethargic;Suspect due to medications Behavior During Therapy: [facility] for tasks assessed/performed Overall Cognitive Status: Within Functional Limits for tasks assessed  General Comments General comments (skin integrity, edema, etc.): Educated on importance of exercise compliance and footsie roll use.    Exercises Total Joint Exercises Ankle Circles/Pumps: AROM;Both;10 reps;Supine Quad Sets: AROM;Left;10 reps;Seated Long Arc Quad: AAROM;Left;10 reps;Seated       Assessment/Plan    PT Assessment Patient needs continued PT services  PT Diagnosis Difficulty walking;Abnormality of gait;Acute pain   PT Problem List Decreased strength;Decreased range of motion;Decreased activity tolerance;Decreased balance;Decreased mobility;Decreased knowledge of use of DME;Decreased knowledge of precautions;Impaired sensation;Pain  PT Treatment Interventions DME instruction;Gait training;Functional mobility training;Therapeutic activities;Therapeutic exercise;Balance training;Neuromuscular re-education;Patient/family education;Modalities   PT Goals (Current goals can be found in the Care Plan section) Acute Rehab PT Goals Patient Stated Goal: Go back home PT Goal Formulation: With patient Time For Goal Achievement: 09/15/13 Potential to Achieve Goals: Good    Frequency 7X/week   Barriers to discharge        Co-evaluation               End of Session   Activity Tolerance: Patient tolerated treatment well Patient left: in chair;with call bell/phone within reach;with family/visitor present Nurse Communication: Mobility status         Time: 1459-1531 PT Time Calculation (min): 32 min   Charges:   PT Evaluation $Initial PT Evaluation Tier I: 1 Procedure PT Treatments $Therapeutic Activity: 8-22 mins   PT G Codes:        Elayne Snare, [place]   Ellouise Newer 09/08/2013, 4:28 PM

## 2013-09-08 NOTE — Transfer of Care (Signed)
Immediate Anesthesia Transfer of Care Note  Patient: Terri Bridges  Procedure(s) Performed: Procedure(s): EXCISIONAL TOTAL KNEE ARTHROPLASTY POLYEXCHANGE (Left)  Patient Location: PACU  Anesthesia Type:General  Level of Consciousness: awake, alert , oriented and patient cooperative  Airway & Oxygen Therapy: Patient Spontanous Breathing and Patient connected to nasal cannula oxygen  Post-op Assessment: Report given to PACU RN and Post -op Vital signs reviewed and stable  Post vital signs: Reviewed and stable  Complications: No apparent anesthesia complications

## 2013-09-08 NOTE — H&P (Signed)
Terri Bridges MRN:  [id number] DOB/SEX:  1934-08-17/female  CHIEF COMPLAINT:  Painful left Knee  HISTORY: Patient is a 79 y.o. female presented with a history of pain in the left knee. Onset of symptoms was abrupt starting several weeks ago with gradually worsening course since that time. Prior procedures on the knee include arthroplasty. Patient has been treated conservatively with over-the-counter NSAIDs and activity modification. Patient currently rates pain in the knee at 7 out of 10 with activity. There is no pain at night. Patient c/o of chest pain last month and is being cleared by caardiology.  PAST MEDICAL HISTORY: Patient Active Problem List   Diagnosis Date Noted  . Preop cardiovascular exam 08/27/2013  . Syncope 08/27/2013  . S/P total knee arthroplasty 04/21/2013  . Osteoporosis, unspecified 03/24/2012  . At high risk for falls 02/12/2012  . Chronic headache 02/12/2012  . Osteoarthritis of both knees 02/12/2012  . Leg pain, bilateral 02/12/2012  . Ruptured tympanic membrane 03/06/2011  . Benign positional vertigo 03/06/2011  . Anxiety 03/06/2011  . COPD (chronic obstructive pulmonary disease) 01/08/2011  . Hypertension 01/05/2011  . Hypothyroidism 01/05/2011  . Recurrent sinusitis 01/05/2011  . Allergic rhinitis 01/05/2011  . Depression 01/05/2011  . GE reflux 01/05/2011   Past Medical History  Diagnosis Date  . Allergy     uses Flonase daily as needed  . Hypothyroidism     takes Synthroid daily  . Hypertension     takes Losartan and Cardizem daily  . Depression     takes Zoloft daily  . GERD (gastroesophageal reflux disease)     takes Omeprazole daily and Protonix daily as needed  . Anxiety     takes Xanax daily as needed  . Asthma     Albuterol as needed  . History of bronchitis     last time many yrs ago  . Ringing in ears     sees Dr.Byers for this  . Joint pain   . Joint swelling   . Osteoporosis   . Constipation     takes an OTC stool  softener  . Urinary frequency   . Urinary urgency   . Urinary incontinence   . Nocturia   . Dry eyes     uses eye drops  . Exertional shortness of breath   . Arthritis     "back" (04/21/2013)  . Scoliosis   . Chronic lower back pain   . Pressure in chest 08/23/13    fell and started having chest pressure, will have ECHO and stress test 09/04/13 before surgery  . Heart murmur     years ago  . H/O hiatal hernia   . Headache(784.0)     sinus   Past Surgical History  Procedure Laterality Date  . Nasal sinus surgery      x 4  . Knee arthroscopy Right   . Shoulder arthroscopy w/ rotator cuff repair Right   . Cataract extraction w/ intraocular lens  implant, bilateral Bilateral   . Tubal ligation    . Colonoscopy    . Esophagogastroduodenoscopy    . Total knee arthroplasty Left 04/21/2013  . Tonsillectomy  1940's  . Total knee arthroplasty Left 04/21/2013    Procedure: TOTAL KNEE ARTHROPLASTY;  Surgeon: Vickey Huger, MD;  Location: [place];  Service: Orthopedics;  Laterality: Left;  Marland Kitchen Eye surgery    . Joint replacement    . Cardiac catheterization  2005     MEDICATIONS:   No prescriptions prior to admission  ALLERGIES:  No Known Allergies  REVIEW OF SYSTEMS:  Pertinent items are noted in HPI.   FAMILY HISTORY:   Family History  Problem Relation Age of Onset  . Heart failure Mother   . Heart failure Father     SOCIAL HISTORY:   History  Substance Use Topics  . Smoking status: Former Smoker -- 1.00 packs/day for 10 years    Types: Cigarettes    Quit date: 03/30/1991  . Smokeless tobacco: Never Used     Comment: quit smoking in Mar 1993  . Alcohol Use: No     EXAMINATION:  Vital signs in last 24 hours:    General appearance: alert, cooperative and no distress Lungs: clear to auscultation bilaterally Heart: regular rate and rhythm, S1, S2 normal, no murmur, click, rub or gallop Abdomen: soft, non-tender; bowel sounds normal; no masses,  no  organomegaly Extremities: extremities normal, atraumatic, no cyanosis or edema and Homans sign is negative, no sign of DVT Pulses: 2+ and symmetric Skin: Skin color, texture, turgor normal. No rashes or lesions Neurologic: Alert and oriented X 3, normal strength and tone. Normal symmetric reflexes. Normal coordination and gait  Musculoskeletal:  ROM 0-100, Ligaments intact,  Imaging Review Plain radiographs demonstrate components in good alignment   Assessment/Plan: Left knee pain s/p total knee    The clearance notes were reviewed.  After discussion with the patient it was felt that poly swap was indicated. The procedure,  risks, and benefits of total knee arthroplasty were presented and reviewed. The risks including but not limited to aseptic loosening, infection, blood clots, vascular injury, stiffness, patella tracking problems complications among others were discussed. The patient acknowledged the explanation, agreed to proceed with the plan.  Reubin Bushnell 09/08/2013, 6:40 AM

## 2013-09-08 NOTE — ED Provider Notes (Signed)
I ws the primary provider of the pt. NP performed laceration repair at my request.  Please see her documentation of the procedures.     EKG Interpretation   Date/Time:  Friday August 15 2013 18:48:27 EDT Ventricular Rate:  90 PR Interval:  238 QRS Duration: 100 QT Interval:  383 QTC Calculation: 469 R Axis:   72 Text Interpretation:  Sinus rhythm Prolonged PR interval No significant  change was found Confirmed by Kelcy Baeten  MD, Zoie Sarin ([id number]) on 08/15/2013  6:51:57 PM        Hoy Morn, MD 09/08/13 [phone]

## 2013-09-08 NOTE — Anesthesia Preprocedure Evaluation (Signed)
Anesthesia Evaluation  Patient identified by MRN, date of birth, ID band Patient awake    Reviewed: Allergy & Precautions, H&P , NPO status , Patient's Chart, lab work & pertinent test results  Airway Mallampati: I TM Distance: >3 FB Neck ROM: Full    Dental   Pulmonary COPDformer smoker,          Cardiovascular hypertension, Pt. on medications     Neuro/Psych    GI/Hepatic GERD-  Medicated and Controlled,  Endo/Other  Hypothyroidism   Renal/GU      Musculoskeletal   Abdominal   Peds  Hematology   Anesthesia Other Findings   Reproductive/Obstetrics                           Anesthesia Physical Anesthesia Plan  ASA: III  Anesthesia Plan: General   Post-op Pain Management:    Induction: Intravenous  Airway Management Planned: LMA  Additional Equipment:   Intra-op Plan:   Post-operative Plan: Extubation in OR  Informed Consent: I have reviewed the patients History and Physical, chart, labs and discussed the procedure including the risks, benefits and alternatives for the proposed anesthesia with the patient or authorized representative who has indicated his/her understanding and acceptance.     Plan Discussed with: CRNA and Surgeon  Anesthesia Plan Comments:         Anesthesia Quick Evaluation

## 2013-09-08 NOTE — Plan of Care (Signed)
Problem: Consults Goal: Diagnosis- Total Joint Replacement Primary Total Knee Left     

## 2013-09-08 NOTE — Progress Notes (Signed)
Orthopedic Tech Progress Note Patient Details:  Terri Bridges Dec 16, 1934 [id number]  CPM Left Knee CPM Left Knee: On Left Knee Flexion (Degrees): 90 Left Knee Extension (Degrees): 0 Additional Comments: Trapeze bar   Cammer, Theodoro Parma 09/08/2013, 1:18 PM

## 2013-09-09 LAB — BASIC METABOLIC PANEL
Anion gap: 8 (ref 5–15)
BUN: 20 mg/dL (ref 6–23)
CO2: 29 mEq/L (ref 19–32)
CREATININE: 1.21 mg/dL — AB (ref 0.50–1.10)
Calcium: 8.7 mg/dL (ref 8.4–10.5)
Chloride: 104 mEq/L (ref 96–112)
GFR, EST AFRICAN AMERICAN: 49 mL/min — AB (ref >90)
GFR, EST NON AFRICAN AMERICAN: 42 mL/min — AB (ref >90)
GLUCOSE: 105 mg/dL — AB (ref 70–99)
Potassium: 4.1 mEq/L (ref 3.7–5.3)
Sodium: 141 mEq/L (ref 137–147)

## 2013-09-09 LAB — CBC
HCT: 33.1 % — ABNORMAL LOW (ref 36.0–46.0)
HEMOGLOBIN: 10.7 g/dL — AB (ref 12.0–15.0)
MCH: 26.1 pg (ref 26.0–34.0)
MCHC: 32.3 g/dL (ref 30.0–36.0)
MCV: 80.7 fL (ref 78.0–100.0)
Platelets: 141 10*3/uL — ABNORMAL LOW (ref 150–400)
RBC: 4.1 MIL/uL (ref 3.87–5.11)
RDW: 14.7 % (ref 11.5–15.5)
WBC: 6.3 10*3/uL (ref 4.0–10.5)

## 2013-09-09 MED ORDER — OXYCODONE HCL 5 MG PO TABS: 5.0000 mg | ORAL_TABLET | ORAL | Status: DC | PRN

## 2013-09-09 MED ORDER — ASPIRIN EC 325 MG PO TBEC: 325.0000 mg | DELAYED_RELEASE_TABLET | Freq: Every day | ORAL | Status: DC

## 2013-09-09 MED ORDER — METHOCARBAMOL 500 MG PO TABS: 500.0000 mg | ORAL_TABLET | Freq: Four times a day (QID) | ORAL | Status: DC | PRN

## 2013-09-09 NOTE — Progress Notes (Signed)
Patient discharged to home accompanied by family. Discharge instructions and rx given and patient stated understanding. IV was removed and patient left unit in a stable condition with all personal belongings via wheelchair.

## 2013-09-09 NOTE — Progress Notes (Signed)
Physical Therapy Treatment Patient Details Name: Terri Bridges MRN: [id number] DOB: 12-26-1934 Today's Date: 09/09/2013    History of Present Illness 79 y.o. female s/p left EXCISIONAL TOTAL KNEE ARTHROPLASTY POLYEXCHANGE. Hx of HTN, COPD, Lt TKA on 04/21/2013, and syncope.    PT Comments    Continues to progress towards physical therapy goals. Reports soreness from AM therapy session, therefore limited ambulatory distance this afternoon. Tolerating therapeutic exercises well. Have reviewed all education, precautions, and safety with mobility. Pt states she is ready to go home today. Feel she is adequate for d/c from a mobility standpoint. Patient will continue to benefit from skilled physical therapy services at home with HHPT to further improve independence with functional mobility.    Follow Up Recommendations  Home health PT;Supervision for mobility/OOB     Equipment Recommendations  None recommended by PT    Recommendations for Other Services OT consult     Precautions / Restrictions Precautions Precautions: Knee Precaution Booklet Issued: Yes (comment) Precaution Comments: Reviewed knee precautions Restrictions Weight Bearing Restrictions: Yes LLE Weight Bearing: Weight bearing as tolerated    Mobility  Bed Mobility Overal bed mobility: Modified Independent                Transfers Overall transfer level: Needs assistance Equipment used: Rolling walker (2 wheeled) Transfers: Sit to/from Stand Sit to Stand: Supervision         General transfer comment: Supervision for safety. VC for hand placement. Safely approaches and descends into chair without assist.  Ambulation/Gait Ambulation/Gait assistance: Supervision Ambulation Distance (Feet): 200 Feet Assistive device: Rolling walker (2 wheeled) Gait Pattern/deviations: Step-to pattern;Step-through pattern;Decreased step length - right;Decreased stance time - left;Antalgic   Gait velocity interpretation:  Below normal speed for age/gender General Gait Details: VC for forward gaze and left knee extension in stance phase for quad activation. No instances of buckling or loss of balance during bout.   Stairs            Wheelchair Mobility    Modified Rankin (Stroke Patients Only)       Balance                                    Cognition Arousal/Alertness: Awake/alert Behavior During Therapy: WFL for tasks assessed/performed Overall Cognitive Status: Within Functional Limits for tasks assessed                      Exercises Total Joint Exercises Ankle Circles/Pumps: AROM;Both;10 reps;Supine Quad Sets: AROM;Left;10 reps;Seated Long Arc Quad: AAROM;Left;10 reps;Seated Knee Flexion: AROM;Left;10 reps;Seated    General Comments        Pertinent Vitals/Pain Pain Assessment: 0-10 Pain Score: 5  Pain Location: Lt knee Pain Intervention(s): Limited activity within patient's tolerance;Monitored during session;Repositioned;Patient requesting pain meds-RN notified    Home Living                      Prior Function            PT Goals (current goals can now be found in the care plan section) Acute Rehab PT Goals PT Goal Formulation: With patient Time For Goal Achievement: 09/15/13 Potential to Achieve Goals: Good Progress towards PT goals: Progressing toward goals    Frequency  7X/week    PT Plan Current plan remains appropriate    Co-evaluation  End of Session   Activity Tolerance: Patient tolerated treatment well Patient left: in chair;with call bell/phone within reach     Time: 1342-1400 PT Time Calculation (min): 18 min  Charges:  $Gait Training: 8-22 mins                    G Codes:      [employer], [place]  Ellouise Newer 09/09/2013, 2:14 PM

## 2013-09-09 NOTE — Progress Notes (Signed)
SPORTS MEDICINE AND JOINT REPLACEMENT  Terri Mulch, MD   Carlynn Spry, PA-C [place], [place], [place]  [address]                             [phone]   PROGRESS NOTE  Subjective:  negative for Chest Pain  negative for Shortness of Breath  negative for Nausea/Vomiting   negative for Calf Pain  negative for Bowel Movement   Tolerating Diet: yes         Patient reports pain as 5 on 0-10 scale.    Objective: Vital signs in last 24 hours:   Patient Vitals for the past 24 hrs:  BP Temp Temp src Pulse Resp SpO2  09/09/13 1320 112/56 mmHg 97.8 F (36.6 C) Oral 73 18 95 %  09/09/13 1103 110/58 mmHg - - - - -  09/09/13 0817 97/50 mmHg 98.5 F (36.9 C) - 82 15 91 %  09/09/13 0457 107/52 mmHg 98 F (36.7 C) - 81 16 93 %  09/09/13 0400 - - - - 16 96 %  09/09/13 0121 111/59 mmHg 97.7 F (36.5 C) - 89 16 92 %  09/09/13 0000 - - - - 14 92 %  09/08/13 2014 119/57 mmHg 97.9 F (36.6 C) - 104 16 93 %  09/08/13 2000 - - - - 16 94 %    @flow {1959:LAST@   Intake/Output from previous day:   08/31 0701 - 09/01 0700 In: 2972.5 [P.O.:240; I.V.:2632.5] Out: 50    Intake/Output this shift:   09/01 0701 - 09/01 1900 In: 780 [P.O.:480; I.V.:300] Out: 225 [Urine:225]   Intake/Output     08/31 0701 - 09/01 0700 09/01 0701 - 09/02 0700   P.O. 240 480   I.V. (mL/kg) 2632.5 (35) 300 (4)   IV Piggyback 100    Total Intake(mL/kg) 2972.5 (39.5) 780 (10.4)   Urine (mL/kg/hr)  225 (0.3)   Blood 50    Total Output 50 225   Net +2922.5 +555        Urine Occurrence 1 x 2 x      LABORATORY DATA:  Recent Labs  09/08/13 1505 09/09/13 0542  WBC 5.6 6.3  HGB 11.9* 10.7*  HCT 35.7* 33.1*  PLT 145* 141*    Recent Labs  09/08/13 1505 09/09/13 0542  NA  --  141  K  --  4.1  CL  --  104  CO2  --  29  BUN  --  20  CREATININE 1.05 1.21*  GLUCOSE  --  105*  CALCIUM  --  8.7   Lab Results  Component Value Date   INR 0.94 08/29/2013   INR 0.99 04/09/2013     Examination:  General appearance: alert, cooperative and no distress Extremities: Homans sign is negative, no sign of DVT  Wound Exam: clean, dry, intact   Drainage:  Scant/small amount Serosanguinous exudate  Motor Exam: EHL and FHL Intact  Sensory Exam: Deep Peroneal and Tibial normal   Assessment:    1 Day Post-Op  Procedure(s) (LRB): EXCISIONAL TOTAL KNEE ARTHROPLASTY POLYEXCHANGE (Left)  ADDITIONAL DIAGNOSIS:  Active Problems:   Left knee pain  Acute Blood Loss Anemia   Plan: Physical Therapy as ordered Weight Bearing as Tolerated (WBAT)  DVT Prophylaxis:  Lovenox  DISCHARGE PLAN: Home  DISCHARGE NEEDS: CPM, Walker and 3-in-1 comode seat         Terri Bridges 09/09/2013, 5:15 PM

## 2013-09-09 NOTE — Progress Notes (Signed)
Physical Therapy Treatment Patient Details Name: Terri Bridges MRN: [id number] DOB: January 12, 1934 Today's Date: 09/09/2013    History of Present Illness 79 y.o. female s/p left EXCISIONAL TOTAL KNEE ARTHROPLASTY POLYEXCHANGE. Hx of HTN, COPD, Lt TKA on 04/21/2013, and syncope.    PT Comments    Patient is progressing well towards physical therapy goals, ambulating up to 325 feet with supervision while using a rolling walker. Focused on gait training and therapeutic exercise, tolerating well. Feel that patient is adequate for discharge from a mobility standpoint when medically ready. Patient will continue to benefit from skilled physical therapy services at home with HHPT to further improve independence with functional mobility.    Follow Up Recommendations  Home health PT;Supervision for mobility/OOB     Equipment Recommendations  None recommended by PT    Recommendations for Other Services OT consult     Precautions / Restrictions Precautions Precautions: Knee Precaution Comments: Reviewed knee precautions Restrictions Weight Bearing Restrictions: Yes LLE Weight Bearing: Weight bearing as tolerated    Mobility  Bed Mobility Overal bed mobility: Modified Independent                Transfers Overall transfer level: Needs assistance Equipment used: Rolling walker (2 wheeled) Transfers: Sit to/from Stand Sit to Stand: Supervision         General transfer comment: Supervision for safety. VC for walker and hand placement to sit down safely.   Ambulation/Gait Ambulation/Gait assistance: Supervision Ambulation Distance (Feet): 325 Feet Assistive device: Rolling walker (2 wheeled) Gait Pattern/deviations: Step-through pattern;Decreased step length - right;Decreased stance time - left;Antalgic   Gait velocity interpretation: Below normal speed for age/gender General Gait Details: VC for walker positioning, especially with turns. Mildy antalgic gait pattern. Additonal  cues for left knee extension in stance phase for quad activation. No loss of balance, no knee buckling.   Stairs            Wheelchair Mobility    Modified Rankin (Stroke Patients Only)       Balance                                    Cognition Arousal/Alertness: Awake/alert Behavior During Therapy: WFL for tasks assessed/performed Overall Cognitive Status: Within Functional Limits for tasks assessed                      Exercises Total Joint Exercises Ankle Circles/Pumps: AROM;Both;10 reps;Supine Quad Sets: AROM;Left;10 reps;Seated Long Arc Quad: AAROM;Left;10 reps;Seated Knee Flexion: AROM;Left;10 reps;Seated Goniometric ROM: 11-82 degrees left knee flexion in sitting    General Comments        Pertinent Vitals/Pain Pain Assessment: 0-10 Pain Score:  ("I'm doing okay" no value given) Pain Intervention(s): Limited activity within patient's tolerance;Monitored during session;Repositioned    Home Living                      Prior Function            PT Goals (current goals can now be found in the care plan section) Acute Rehab PT Goals PT Goal Formulation: With patient Time For Goal Achievement: 09/15/13 Potential to Achieve Goals: Good Progress towards PT goals: Progressing toward goals    Frequency  7X/week    PT Plan Current plan remains appropriate    Co-evaluation             End of  Session   Activity Tolerance: Patient tolerated treatment well Patient left: in chair;with call bell/phone within reach     Time: 0834-0900 PT Time Calculation (min): 26 min  Charges:  $Gait Training: 8-22 mins $Therapeutic Exercise: 8-22 mins                    G Codes:      [employer], [place]  Ellouise Newer 09/09/2013, 9:07 AM

## 2013-09-09 NOTE — Op Note (Signed)
Dictation [employer]

## 2013-09-09 NOTE — Progress Notes (Signed)
OT Cancellation Note  Patient Details Name: Terri Bridges MRN: [id number] DOB: [birth year]   Cancelled Treatment:    Reason Eval/Treat Not Completed: OT screened, no needs identified, will sign off. Pt her for knee revision from April of this year. She reports no concerns about BADLs, we will sign off.  Almon Register [phone] 09/09/2013, 10:44 AM

## 2013-09-09 NOTE — Discharge Instructions (Signed)
Diet: As you were doing prior to hospitalization   Activity:  Increase activity slowly as tolerated                  No lifting or driving for 6 weeks  Shower:  May shower without a dressing once there is no drainage from your wound.                 Do NOT wash over the wound.                 Dressing:  You may change your dressing on Wednesday                    Then change the dressing daily with sterile 4"x4"s gauze dressing                     And TED hose for knees.  Weight Bearing:  Weight bearing as tolerated as taught in physical therapy.  Use a                                walker or Crutches as instructed.  To prevent constipation: you may use a stool softener such as -               Colace ( over the counter) 100 mg by mouth twice a day                Drink plenty of fluids ( prune juice may be helpful) and high fiber foods                Miralax ( over the counter) for constipation as needed.    Precautions:  If you experience chest pain or shortness of breath - call 911 immediately               For transfer to the hospital emergency department!!               If you develop a fever greater that 101 F, purulent drainage from wound,                             increased redness or drainage from wound, or calf pain -- Call the office.  Follow- Up Appointment:  Please call for an appointment to be seen on 09/23/13                                              [facility] office:  [phone]            [place], [place] [address]  Physical therapy 09/11/13 please call to confirm

## 2013-09-10 ENCOUNTER — Encounter (HOSPITAL_COMMUNITY): Payer: Self-pay | Admitting: Orthopedic Surgery

## 2013-09-10 ENCOUNTER — Telehealth: Payer: Self-pay | Admitting: *Deleted

## 2013-09-10 NOTE — Op Note (Addendum)
[name], DELEO NO.:  [id number]  MEDICAL RECORD NO.:  [id number]  LOCATION:  5N05C                        FACILITY:  [facility]  PHYSICIAN:  Terri Bridges, M.D. DATE OF BIRTH:  30-Apr-1934  DATE OF PROCEDURE:  09/08/2013 DATE OF DISCHARGE:  09/09/2013                              OPERATIVE REPORT   SURGEON:  Terri Bridges, M.D.  ASSISTANT:  Carlynn Spry, PA-C.  ANESTHESIA:  General.  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  PROCEDURE:  Left revision total knee arthroplasty with polyethylene exchange.  INDICATION FOR PROCEDURE:  The patient is a 79 year old white female status post left knee replacement done by myself.  She had an injury in physical therapy where she was then having mechanical symptoms and unfortunately, these did not abate and she needed repeat surgery due to instability in the knee.  DESCRIPTION OF PROCEDURE:  The patient was laid supine, administered general anesthesia.  Left leg prepped and draped in usual fashion.  The extremity exsanguinated with the Esmarch and tourniquet inflated to 300 mmHg.  The same incision was used anteriorly and made with a #10 blade. I used the new blade to incise the medial parapatellar arthrotomy. Evaluation of the knee looked excellent.  There were no loose bodies. There were no signs of any infection.  Upon further examination, it appeared that perhaps she had injury of the MCL and there was more opening medially with a good end point.  I removed the polyethylene and examined the back of the knee, I could not find any loose bodies.  The popliteus tendon which I felt may have avulsed was intact.  I then incised a little laterally, releasing the further balance of the knee.  I then went from 11 polyethylene to 14 polyethylene and at this point, had excellent stability with a  very smooth arc of motion and no popping or catching or clunking in the knee. I then, lavaged, put in the real polyethylene, and then closed the arthrotomy with #1 Vicryl sutures.  I did infiltrate with Exparel both deep and in the arthrotomy and the skin, subcutaneous tissues.  I did not need a drain.  I then closed the deep soft tissue using 0 Vicryl suture, subcuticular 2-0 Vicryl sutures, skin staples.  Dressed with Xeroform dressing, sponges, sterile Webril and an ACE.  COMPLICATIONS:  None.  DRAINS:  None.          ______________________________ Terri Bridges, M.D.     SDL/MEDQ  D:  09/09/2013  T:  09/09/2013  Job:  [id number]

## 2013-09-10 NOTE — Care Management Note (Signed)
CARE MANAGEMENT NOTE 09/10/2013  Patient:  DEOLA, REWIS   Account Number:  [id number]  Date Initiated:  09/09/2013  Documentation initiated by:  Ricki Miller  Subjective/Objective Assessment:   79 yr old female s/p left total knee poly exchange.     Action/Plan:   Patient was preoperatively setup with outpatient physical therapy. [facility] 09/11/13. No case manager needs identified.   Anticipated DC Date:  09/09/2013   Anticipated DC Plan:  HOME/SELF CARE      DC Planning Services  CM consult      PAC Choice  DURABLE MEDICAL EQUIPMENT   Choice offered to / List presented to:     DME arranged  3-N-1  [place]  CPM      DME agency  [employer]     HH arranged  NA      Status of service:  Completed, signed off Medicare Important Message given?  NA - LOS <3 / Initial given by admissions (If response is "NO", the following Medicare IM given date fields will be blank) Date Medicare IM given:   Medicare IM given by:   Date Additional Medicare IM given:   Additional Medicare IM given by:    Discharge Disposition:  HOME/SELF CARE  Per UR Regulation:  Reviewed for med. necessity/level of care/duration of stay

## 2013-09-10 NOTE — Telephone Encounter (Signed)
PT AWARE OF MYOVIEW RESULTS./CY 

## 2013-09-12 ENCOUNTER — Encounter: Payer: Self-pay | Admitting: Internal Medicine

## 2013-09-12 ENCOUNTER — Ambulatory Visit (INDEPENDENT_AMBULATORY_CARE_PROVIDER_SITE_OTHER): Payer: Medicare Other | Admitting: Internal Medicine

## 2013-09-12 VITALS — BP 128/68 | HR 94 | Ht 64.0 in | Wt 165.0 lb

## 2013-09-12 DIAGNOSIS — E0590 Thyrotoxicosis, unspecified without thyrotoxic crisis or storm: Secondary | ICD-10-CM

## 2013-09-12 DIAGNOSIS — IMO0001 Reserved for inherently not codable concepts without codable children: Secondary | ICD-10-CM | POA: Diagnosis not present

## 2013-09-12 DIAGNOSIS — F3289 Other specified depressive episodes: Secondary | ICD-10-CM | POA: Diagnosis not present

## 2013-09-12 DIAGNOSIS — F329 Major depressive disorder, single episode, unspecified: Secondary | ICD-10-CM | POA: Diagnosis not present

## 2013-09-12 DIAGNOSIS — I10 Essential (primary) hypertension: Secondary | ICD-10-CM

## 2013-09-12 DIAGNOSIS — Z23 Encounter for immunization: Secondary | ICD-10-CM

## 2013-09-12 DIAGNOSIS — Z4789 Encounter for other orthopedic aftercare: Secondary | ICD-10-CM | POA: Diagnosis not present

## 2013-09-12 DIAGNOSIS — R269 Unspecified abnormalities of gait and mobility: Secondary | ICD-10-CM | POA: Diagnosis not present

## 2013-09-12 DIAGNOSIS — E119 Type 2 diabetes mellitus without complications: Secondary | ICD-10-CM | POA: Diagnosis not present

## 2013-09-12 DIAGNOSIS — J45909 Unspecified asthma, uncomplicated: Secondary | ICD-10-CM | POA: Diagnosis not present

## 2013-09-12 LAB — CBC WITH DIFFERENTIAL/PLATELET
Basophils Absolute: 0 10*3/uL (ref 0.0–0.1)
Basophils Relative: 0 % (ref 0–1)
Eosinophils Absolute: 0.2 10*3/uL (ref 0.0–0.7)
Eosinophils Relative: 4 % (ref 0–5)
HEMATOCRIT: 36.5 % (ref 36.0–46.0)
Hemoglobin: 12.2 g/dL (ref 12.0–15.0)
LYMPHS ABS: 0.8 10*3/uL (ref 0.7–4.0)
LYMPHS PCT: 17 % (ref 12–46)
MCH: 25.6 pg — ABNORMAL LOW (ref 26.0–34.0)
MCHC: 33.4 g/dL (ref 30.0–36.0)
MCV: 76.5 fL — ABNORMAL LOW (ref 78.0–100.0)
Monocytes Absolute: 0.4 10*3/uL (ref 0.1–1.0)
Monocytes Relative: 9 % (ref 3–12)
Neutro Abs: 3.3 10*3/uL (ref 1.7–7.7)
Neutrophils Relative %: 70 % (ref 43–77)
PLATELETS: 191 10*3/uL (ref 150–400)
RBC: 4.77 MIL/uL (ref 3.87–5.11)
RDW: 15.2 % (ref 11.5–15.5)
WBC: 4.7 10*3/uL (ref 4.0–10.5)

## 2013-09-12 LAB — TSH: TSH: 0.173 u[IU]/mL — ABNORMAL LOW (ref 0.350–4.500)

## 2013-09-12 NOTE — Patient Instructions (Signed)
Return in January for TSH CBC and basic metabolic panel. Labs drawn today. Flu vaccine given today.

## 2013-09-12 NOTE — Progress Notes (Signed)
   Subjective:    Patient ID: Terri Bridges, female    DOB: 1934/09/24, 79 y.o.   MRN: [id number]  HPI   Recent cardiac evaluation with Myoview study was negative for ischemia. She's here today following knee surgery on August 31. She's able to ambulate with a walker. Will have home health followup. Recent TSH in July was low but I didn't change her dose of Synthroid. Here to repeat TSH today along with CBC and basic metabolic panel. We are going to followup on her creatinine which has been ranging about 1.2. She's going to get a flu vaccine today. She was pleased to hear that her cardiac workup was negative.    Review of Systems     Objective:   Physical Exam  Skin warm and dry. Nodes none. Neck is supple without thyromegaly. Chest clear. Cardiac exam regular rate and rhythm. Extremities without edema      Assessment & Plan:  Hypothyroidism-recent TSH was low on Synthroid 0.15 mg daily. May need to adjust dose.  Depression-stable  Status post repeat left knee surgery  Hypertension  Recent syncopal episode followed by cardiac workup which was negative. Syncope thought to be due to volume depletion and heat  Plan: Return in January which will be her 6 month recheck appointment. We will check CBC basic metabolic panel and TSH at that time.  25 minutes spent with patient

## 2013-09-13 LAB — BASIC METABOLIC PANEL
BUN: 13 mg/dL (ref 6–23)
CHLORIDE: 100 meq/L (ref 96–112)
CO2: 30 mEq/L (ref 19–32)
CREATININE: 0.99 mg/dL (ref 0.50–1.10)
Calcium: 9 mg/dL (ref 8.4–10.5)
Glucose, Bld: 126 mg/dL — ABNORMAL HIGH (ref 70–99)
POTASSIUM: 4 meq/L (ref 3.5–5.3)
Sodium: 141 mEq/L (ref 135–145)

## 2013-09-16 ENCOUNTER — Telehealth: Payer: Self-pay

## 2013-09-16 DIAGNOSIS — E119 Type 2 diabetes mellitus without complications: Secondary | ICD-10-CM | POA: Diagnosis not present

## 2013-09-16 DIAGNOSIS — J45909 Unspecified asthma, uncomplicated: Secondary | ICD-10-CM | POA: Diagnosis not present

## 2013-09-16 DIAGNOSIS — Z4789 Encounter for other orthopedic aftercare: Secondary | ICD-10-CM | POA: Diagnosis not present

## 2013-09-16 DIAGNOSIS — R269 Unspecified abnormalities of gait and mobility: Secondary | ICD-10-CM | POA: Diagnosis not present

## 2013-09-16 DIAGNOSIS — F329 Major depressive disorder, single episode, unspecified: Secondary | ICD-10-CM | POA: Diagnosis not present

## 2013-09-16 DIAGNOSIS — IMO0001 Reserved for inherently not codable concepts without codable children: Secondary | ICD-10-CM | POA: Diagnosis not present

## 2013-09-16 DIAGNOSIS — F3289 Other specified depressive episodes: Secondary | ICD-10-CM | POA: Diagnosis not present

## 2013-09-16 NOTE — Telephone Encounter (Signed)
Patient informed of lab results.  Advised her per Dr Renold Genta to take OTC iron supplement and decrease her thyroid medication.  Patient understands.

## 2013-09-17 DIAGNOSIS — F3289 Other specified depressive episodes: Secondary | ICD-10-CM | POA: Diagnosis not present

## 2013-09-17 DIAGNOSIS — R269 Unspecified abnormalities of gait and mobility: Secondary | ICD-10-CM | POA: Diagnosis not present

## 2013-09-17 DIAGNOSIS — Z4789 Encounter for other orthopedic aftercare: Secondary | ICD-10-CM | POA: Diagnosis not present

## 2013-09-17 DIAGNOSIS — IMO0001 Reserved for inherently not codable concepts without codable children: Secondary | ICD-10-CM | POA: Diagnosis not present

## 2013-09-17 DIAGNOSIS — E119 Type 2 diabetes mellitus without complications: Secondary | ICD-10-CM | POA: Diagnosis not present

## 2013-09-17 DIAGNOSIS — F329 Major depressive disorder, single episode, unspecified: Secondary | ICD-10-CM | POA: Diagnosis not present

## 2013-09-17 DIAGNOSIS — J45909 Unspecified asthma, uncomplicated: Secondary | ICD-10-CM | POA: Diagnosis not present

## 2013-09-18 DIAGNOSIS — J45909 Unspecified asthma, uncomplicated: Secondary | ICD-10-CM | POA: Diagnosis not present

## 2013-09-18 DIAGNOSIS — R269 Unspecified abnormalities of gait and mobility: Secondary | ICD-10-CM | POA: Diagnosis not present

## 2013-09-18 DIAGNOSIS — Z4789 Encounter for other orthopedic aftercare: Secondary | ICD-10-CM | POA: Diagnosis not present

## 2013-09-18 DIAGNOSIS — F3289 Other specified depressive episodes: Secondary | ICD-10-CM | POA: Diagnosis not present

## 2013-09-18 DIAGNOSIS — IMO0001 Reserved for inherently not codable concepts without codable children: Secondary | ICD-10-CM | POA: Diagnosis not present

## 2013-09-18 DIAGNOSIS — E119 Type 2 diabetes mellitus without complications: Secondary | ICD-10-CM | POA: Diagnosis not present

## 2013-09-18 DIAGNOSIS — F329 Major depressive disorder, single episode, unspecified: Secondary | ICD-10-CM | POA: Diagnosis not present

## 2013-09-18 NOTE — Discharge Summary (Signed)
SPORTS MEDICINE & JOINT REPLACEMENT   Terri Mulch, MD   Terri Spry, PA-C [place], [place], [place]  [address]                             [phone]  PATIENT ID: Terri Bridges        MRN:  [id number]          DOB/AGE: [birth year] / 79 y.o.    DISCHARGE SUMMARY  ADMISSION DATE:    09/08/2013 DISCHARGE DATE:  09/09/2013  ADMISSION DIAGNOSIS: painful hardware clunking left total knee    DISCHARGE DIAGNOSIS:  painful hardware/clunking left total knee    ADDITIONAL DIAGNOSIS: Active Problems:   Left knee pain  Past Medical History  Diagnosis Date  . Allergy     uses Flonase daily as needed  . Hypothyroidism     takes Synthroid daily  . Hypertension     takes Losartan and Cardizem daily  . Depression     takes Zoloft daily  . GERD (gastroesophageal reflux disease)     takes Omeprazole daily and Protonix daily as needed  . Anxiety     takes Xanax daily as needed  . Asthma     Albuterol as needed  . History of bronchitis     last time many yrs ago  . Ringing in ears     sees Dr.Byers for this  . Joint pain   . Joint swelling   . Osteoporosis   . Constipation     takes an OTC stool softener  . Urinary frequency   . Urinary urgency   . Urinary incontinence   . Nocturia   . Dry eyes     uses eye drops  . Exertional shortness of breath   . Arthritis     "back" (04/21/2013)  . Scoliosis   . Chronic lower back pain   . Pressure in chest 08/23/13    fell and started having chest pressure, will have ECHO and stress test 09/04/13 before surgery  . Heart murmur     years ago  . H/O hiatal hernia   . Headache(784.0)     sinus    PROCEDURE: Procedure(s): EXCISIONAL TOTAL KNEE ARTHROPLASTY POLYEXCHANGE on 09/08/2013  CONSULTS:     HISTORY:  See H&P in chart  HOSPITAL COURSE:  Terri Bridges is a 79 y.o. admitted on 09/08/2013 and found to have a diagnosis of painful hardware/clunking left total knee.  After appropriate laboratory studies were  obtained  they were taken to the operating room on 09/08/2013 and underwent Procedure(s): EXCISIONAL TOTAL KNEE ARTHROPLASTY POLYEXCHANGE.   They were given perioperative antibiotics:  Anti-infectives   Start     Dose/Rate Route Frequency Ordered Stop   09/08/13 1415  ceFAZolin (ANCEF) IVPB 1 g/50 mL premix     1 g 100 mL/hr over 30 Minutes Intravenous Every 6 hours 09/08/13 1340 09/08/13 2158   09/08/13 0600  ceFAZolin (ANCEF) IVPB 2 g/50 mL premix     2 g 100 mL/hr over 30 Minutes Intravenous On call to O.R. 09/07/13 1404 09/08/13 1034    .  Tolerated the procedure well.  Placed with a foley intraoperatively.  Given Ofirmev at induction and for 48 hours.    POD# 1: Vital signs were stable.  Patient denied Chest pain, shortness of breath, or calf pain.  Patient was started on Lovenox 30 mg subcutaneously twice daily at 8am.  Consults to  PT, OT, and care management were made.  The patient was weight bearing as tolerated.  CPM was placed on the operative leg 0-90 degrees for 6-8 hours a day.  Incentive spirometry was taught.  Dressing was changed.  Hemovac was discontinued.      POD #2, Continued  PT for ambulation and exercise program.  IV saline locked.  O2 discontinued.    The remainder of the hospital course was dedicated to ambulation and strengthening.   The patient was discharged on 1 day post op in  Good condition.  Blood products given:none  DIAGNOSTIC STUDIES: Recent vital signs: No data found.      Recent laboratory studies:  Recent Labs  09/12/13 1020  WBC 4.7  HGB 12.2  HCT 36.5  PLT 191    Recent Labs  09/12/13 1020  NA 141  K 4.0  CL 100  CO2 30  BUN 13  CREATININE 0.99  GLUCOSE 126*  CALCIUM 9.0   Lab Results  Component Value Date   INR 0.94 08/29/2013   INR 0.99 04/09/2013     Recent Radiographic Studies :  Ct Angio Chest Pe W/cm &/or Wo Cm  08/27/2013   CLINICAL DATA:  Chest pain and pressure.  Shortness of breath.  EXAM: CT ANGIOGRAPHY CHEST  WITH CONTRAST  TECHNIQUE: Multidetector CT imaging of the chest was performed using the standard protocol during bolus administration of intravenous contrast. Multiplanar CT image reconstructions and MIPs were obtained to evaluate the vascular anatomy.  CONTRAST:  80mL OMNIPAQUE IOHEXOL 350 MG/ML SOLN  COMPARISON:  None.  FINDINGS: Soft tissue / Mediastinum: There is no filling defect in the opacified pulmonary arteries to suggest the presence of an acute pulmonary embolus. No thoracic aortic aneurysm. Aberrant origin of the right subclavian artery noted.  No axillary lymphadenopathy. No mediastinal or hilar lymphadenopathy. The heart size is normal. No pericardial effusion.  A large hiatal hernia contains more than half the stomach.  Lungs / Pleura: Biapical scarring is symmetric. There is some dependent atelectasis in the lower lobes bilaterally. Compressive atelectasis is seen in the left lower lobe adjacent to the hiatal hernia. No focal airspace consolidation or pulmonary edema. No pulmonary parenchymal nodule or mass.  Bones: Bone windows reveal no worrisome lytic or sclerotic osseous lesions.  Upper Abdomen: Incompletely visualized 6 cm water density structure in the upper left retroperitoneal space is probably a large exophytic left renal cyst.  Review of the MIP images confirms the above findings.  IMPRESSION: No CT evidence for acute pulmonary embolus. No CT findings to explain the patient's history of chest pressure and shortness of breath.  Large hiatal hernia.   Electronically Signed   By: Misty Stanley M.D.   On: 08/27/2013 15:11    DISCHARGE INSTRUCTIONS: Discharge Instructions   CPM    Complete by:  As directed   Continuous passive motion machine (CPM):      Use the CPM from 0 to 90 for 6-8 hours per day.      You may increase by 10 per day.  You may break it up into 2 or 3 sessions per day.      Use CPM for 2 weeks or until you are told to stop.     Call MD / Call 911    Complete by:  As  directed   If you experience chest pain or shortness of breath, CALL 911 and be transported to the hospital emergency room.  If you develope a fever above  40 F, pus (white drainage) or increased drainage or redness at the wound, or calf pain, call your surgeon's office.     Change dressing    Complete by:  As directed   Change dressing on Wednesday, then change the dressing daily with sterile 4 x 4 inch gauze dressing and apply TED hose.     Constipation Prevention    Complete by:  As directed   Drink plenty of fluids.  Prune juice may be helpful.  You may use a stool softener, such as Colace (over the counter) 100 mg twice a day.  Use MiraLax (over the counter) for constipation as needed.     Diet - low sodium heart healthy    Complete by:  As directed      Driving restrictions    Complete by:  As directed   No driving for 6 weeks     Increase activity slowly as tolerated    Complete by:  As directed      Lifting restrictions    Complete by:  As directed   No lifting for 6 weeks     TED hose    Complete by:  As directed   Use stockings (TED hose) for 3 weeks on both leg(s).  You may remove them at night for sleeping.           DISCHARGE MEDICATIONS:     Medication List         albuterol 108 (90 BASE) MCG/ACT inhaler  Commonly known as:  PROVENTIL HFA;VENTOLIN HFA  Inhale 2 puffs into the lungs every 6 (six) hours as needed for wheezing or shortness of breath.     ALPRAZolam 0.5 MG tablet  Commonly known as:  XANAX  Take 0.5 mg by mouth at bedtime as needed for anxiety.     amLODipine 5 MG tablet  Commonly known as:  NORVASC  Take 1 tablet (5 mg total) by mouth daily.     aspirin EC 325 MG tablet  Take 1 tablet (325 mg total) by mouth daily.     CALTRATE 600 PLUS-VIT D PO  Take 1 tablet by mouth daily.     FISH OIL PO  Take 1 capsule by mouth daily.     furosemide 20 MG tablet  Commonly known as:  LASIX  Take 1 tablet (20 mg total) by mouth daily.      levothyroxine 150 MCG tablet  Commonly known as:  SYNTHROID, LEVOTHROID  Take 1 tablet (150 mcg total) by mouth daily before breakfast.     losartan 100 MG tablet  Commonly known as:  COZAAR  Take 100 mg by mouth daily.     methocarbamol 500 MG tablet  Commonly known as:  ROBAXIN  Take 1-2 tablets (500-1,000 mg total) by mouth every 6 (six) hours as needed for muscle spasms.     omeprazole 20 MG capsule  Commonly known as:  PRILOSEC  Take 20 mg by mouth daily.     oxyCODONE 5 MG immediate release tablet  Commonly known as:  Oxy IR/ROXICODONE  Take 1-2 tablets (5-10 mg total) by mouth every 3 (three) hours as needed for breakthrough pain.     sertraline 100 MG tablet  Commonly known as:  ZOLOFT  Take 1 tablet (100 mg total) by mouth daily.     VISINE OP  Apply 1 drop to eye daily as needed.        FOLLOW UP VISIT:       Follow-up  Information   Follow up with Rudean Haskell, MD. Call on 09/23/2013.   Specialty:  Orthopedic Surgery   Contact information:   [address] [place] [address] [phone]       DISPOSITION: HOME  CONDITION:  Good   Ashelynn Marks 09/18/2013, 10:24 AM

## 2013-09-19 DIAGNOSIS — Z4789 Encounter for other orthopedic aftercare: Secondary | ICD-10-CM | POA: Diagnosis not present

## 2013-09-19 DIAGNOSIS — IMO0001 Reserved for inherently not codable concepts without codable children: Secondary | ICD-10-CM | POA: Diagnosis not present

## 2013-09-19 DIAGNOSIS — F329 Major depressive disorder, single episode, unspecified: Secondary | ICD-10-CM | POA: Diagnosis not present

## 2013-09-19 DIAGNOSIS — J45909 Unspecified asthma, uncomplicated: Secondary | ICD-10-CM | POA: Diagnosis not present

## 2013-09-19 DIAGNOSIS — F3289 Other specified depressive episodes: Secondary | ICD-10-CM | POA: Diagnosis not present

## 2013-09-19 DIAGNOSIS — R269 Unspecified abnormalities of gait and mobility: Secondary | ICD-10-CM | POA: Diagnosis not present

## 2013-09-19 DIAGNOSIS — E119 Type 2 diabetes mellitus without complications: Secondary | ICD-10-CM | POA: Diagnosis not present

## 2013-09-22 DIAGNOSIS — R269 Unspecified abnormalities of gait and mobility: Secondary | ICD-10-CM | POA: Diagnosis not present

## 2013-09-22 DIAGNOSIS — F3289 Other specified depressive episodes: Secondary | ICD-10-CM | POA: Diagnosis not present

## 2013-09-22 DIAGNOSIS — E119 Type 2 diabetes mellitus without complications: Secondary | ICD-10-CM | POA: Diagnosis not present

## 2013-09-22 DIAGNOSIS — J45909 Unspecified asthma, uncomplicated: Secondary | ICD-10-CM | POA: Diagnosis not present

## 2013-09-22 DIAGNOSIS — F329 Major depressive disorder, single episode, unspecified: Secondary | ICD-10-CM | POA: Diagnosis not present

## 2013-09-22 DIAGNOSIS — Z4789 Encounter for other orthopedic aftercare: Secondary | ICD-10-CM | POA: Diagnosis not present

## 2013-09-22 DIAGNOSIS — IMO0001 Reserved for inherently not codable concepts without codable children: Secondary | ICD-10-CM | POA: Diagnosis not present

## 2013-09-23 DIAGNOSIS — Z471 Aftercare following joint replacement surgery: Secondary | ICD-10-CM | POA: Diagnosis not present

## 2013-09-23 DIAGNOSIS — Z96659 Presence of unspecified artificial knee joint: Secondary | ICD-10-CM | POA: Diagnosis not present

## 2013-09-24 DIAGNOSIS — Z4789 Encounter for other orthopedic aftercare: Secondary | ICD-10-CM | POA: Diagnosis not present

## 2013-09-24 DIAGNOSIS — R269 Unspecified abnormalities of gait and mobility: Secondary | ICD-10-CM | POA: Diagnosis not present

## 2013-09-24 DIAGNOSIS — F329 Major depressive disorder, single episode, unspecified: Secondary | ICD-10-CM | POA: Diagnosis not present

## 2013-09-24 DIAGNOSIS — F3289 Other specified depressive episodes: Secondary | ICD-10-CM | POA: Diagnosis not present

## 2013-09-24 DIAGNOSIS — J45909 Unspecified asthma, uncomplicated: Secondary | ICD-10-CM | POA: Diagnosis not present

## 2013-09-24 DIAGNOSIS — IMO0001 Reserved for inherently not codable concepts without codable children: Secondary | ICD-10-CM | POA: Diagnosis not present

## 2013-09-24 DIAGNOSIS — E119 Type 2 diabetes mellitus without complications: Secondary | ICD-10-CM | POA: Diagnosis not present

## 2013-09-25 DIAGNOSIS — J45909 Unspecified asthma, uncomplicated: Secondary | ICD-10-CM | POA: Diagnosis not present

## 2013-09-25 DIAGNOSIS — IMO0001 Reserved for inherently not codable concepts without codable children: Secondary | ICD-10-CM | POA: Diagnosis not present

## 2013-09-25 DIAGNOSIS — F329 Major depressive disorder, single episode, unspecified: Secondary | ICD-10-CM | POA: Diagnosis not present

## 2013-09-25 DIAGNOSIS — Z4789 Encounter for other orthopedic aftercare: Secondary | ICD-10-CM | POA: Diagnosis not present

## 2013-09-25 DIAGNOSIS — F3289 Other specified depressive episodes: Secondary | ICD-10-CM | POA: Diagnosis not present

## 2013-09-25 DIAGNOSIS — R269 Unspecified abnormalities of gait and mobility: Secondary | ICD-10-CM | POA: Diagnosis not present

## 2013-09-25 DIAGNOSIS — E119 Type 2 diabetes mellitus without complications: Secondary | ICD-10-CM | POA: Diagnosis not present

## 2013-09-26 DIAGNOSIS — J45909 Unspecified asthma, uncomplicated: Secondary | ICD-10-CM | POA: Diagnosis not present

## 2013-09-26 DIAGNOSIS — E119 Type 2 diabetes mellitus without complications: Secondary | ICD-10-CM | POA: Diagnosis not present

## 2013-09-26 DIAGNOSIS — Z4789 Encounter for other orthopedic aftercare: Secondary | ICD-10-CM | POA: Diagnosis not present

## 2013-09-26 DIAGNOSIS — F3289 Other specified depressive episodes: Secondary | ICD-10-CM | POA: Diagnosis not present

## 2013-09-26 DIAGNOSIS — IMO0001 Reserved for inherently not codable concepts without codable children: Secondary | ICD-10-CM | POA: Diagnosis not present

## 2013-09-26 DIAGNOSIS — R269 Unspecified abnormalities of gait and mobility: Secondary | ICD-10-CM | POA: Diagnosis not present

## 2013-09-26 DIAGNOSIS — F329 Major depressive disorder, single episode, unspecified: Secondary | ICD-10-CM | POA: Diagnosis not present

## 2013-09-29 DIAGNOSIS — Z96659 Presence of unspecified artificial knee joint: Secondary | ICD-10-CM | POA: Diagnosis not present

## 2013-09-29 DIAGNOSIS — M25569 Pain in unspecified knee: Secondary | ICD-10-CM | POA: Diagnosis not present

## 2013-09-29 DIAGNOSIS — M25669 Stiffness of unspecified knee, not elsewhere classified: Secondary | ICD-10-CM | POA: Diagnosis not present

## 2013-09-29 DIAGNOSIS — R262 Difficulty in walking, not elsewhere classified: Secondary | ICD-10-CM | POA: Diagnosis not present

## 2013-10-01 DIAGNOSIS — M25669 Stiffness of unspecified knee, not elsewhere classified: Secondary | ICD-10-CM | POA: Diagnosis not present

## 2013-10-01 DIAGNOSIS — M25569 Pain in unspecified knee: Secondary | ICD-10-CM | POA: Diagnosis not present

## 2013-10-01 DIAGNOSIS — Z96659 Presence of unspecified artificial knee joint: Secondary | ICD-10-CM | POA: Diagnosis not present

## 2013-10-01 DIAGNOSIS — R262 Difficulty in walking, not elsewhere classified: Secondary | ICD-10-CM | POA: Diagnosis not present

## 2013-10-06 DIAGNOSIS — R262 Difficulty in walking, not elsewhere classified: Secondary | ICD-10-CM | POA: Diagnosis not present

## 2013-10-06 DIAGNOSIS — M25569 Pain in unspecified knee: Secondary | ICD-10-CM | POA: Diagnosis not present

## 2013-10-06 DIAGNOSIS — Z96659 Presence of unspecified artificial knee joint: Secondary | ICD-10-CM | POA: Diagnosis not present

## 2013-10-06 DIAGNOSIS — M25669 Stiffness of unspecified knee, not elsewhere classified: Secondary | ICD-10-CM | POA: Diagnosis not present

## 2013-10-08 DIAGNOSIS — R262 Difficulty in walking, not elsewhere classified: Secondary | ICD-10-CM | POA: Diagnosis not present

## 2013-10-08 DIAGNOSIS — M25569 Pain in unspecified knee: Secondary | ICD-10-CM | POA: Diagnosis not present

## 2013-10-08 DIAGNOSIS — Z96659 Presence of unspecified artificial knee joint: Secondary | ICD-10-CM | POA: Diagnosis not present

## 2013-10-08 DIAGNOSIS — M25669 Stiffness of unspecified knee, not elsewhere classified: Secondary | ICD-10-CM | POA: Diagnosis not present

## 2013-10-09 ENCOUNTER — Ambulatory Visit (INDEPENDENT_AMBULATORY_CARE_PROVIDER_SITE_OTHER): Payer: Medicare Other | Admitting: Cardiovascular Disease

## 2013-10-09 ENCOUNTER — Encounter: Payer: Self-pay | Admitting: Cardiovascular Disease

## 2013-10-09 VITALS — BP 106/60 | HR 85 | Ht 64.0 in | Wt 164.0 lb

## 2013-10-09 DIAGNOSIS — J410 Simple chronic bronchitis: Secondary | ICD-10-CM | POA: Diagnosis not present

## 2013-10-09 DIAGNOSIS — R55 Syncope and collapse: Secondary | ICD-10-CM | POA: Diagnosis not present

## 2013-10-09 DIAGNOSIS — Z96652 Presence of left artificial knee joint: Secondary | ICD-10-CM

## 2013-10-09 NOTE — Assessment & Plan Note (Signed)
Still with pain in left knee post redo  Continue PT/OT  F/U ortho

## 2013-10-09 NOTE — Progress Notes (Signed)
Patient ID: Terri Bridges, female   DOB: 04/22/1934, 79 y.o.   MRN: [id number] 79 yo referred by Dr Renold Genta.8/15   She had TKR on left 4/15 and needs revision end of this month. Has had atypical SSCP last two weeks Had syncopal episode 8/7 and seen in ER   Was helping church role fried pies. Had been on her feet a lot and had more lasix than usual last 3 days. In ER BUN 25 and CR 1.7. Had laceration on scalp that required staples Those removed by Dr Renold Genta this week. Since then has had positional pain in chest with radiation to back Worse at night when she roles side to side. No real pleurisy. No history of CAD CRF HTN Denies LE edema or calf pain. L knee "catches" and does not have fluid motion and needs to be revised by Dr Ronnie Derby   Normal myovue 09/08/13 CT negative for PE and normal aorta   Had uncomplicated left knee revision 9/15      ROS: Denies fever, malais, weight loss, blurry vision, decreased visual acuity, cough, sputum, SOB, hemoptysis, pleuritic pain, palpitaitons, heartburn, abdominal pain, melena, lower extremity edema, claudication, or rash.  All other systems reviewed and negative  General: Affect appropriate Healthy:  appears stated age 10: normal Neck supple with no adenopathy JVP normal no bruits no thyromegaly Lungs clear with no wheezing and good diaphragmatic motion Heart:  S1/S2 no murmur, no rub, gallop or click PMI normal Abdomen: benighn, BS positve, no tenderness, no AAA no bruit.  No HSM or HJR Distal pulses intact with no bruits No edema Neuro non-focal Skin warm and dry No muscular weakness   Current Outpatient Prescriptions  Medication Sig Dispense Refill  . albuterol (PROVENTIL HFA;VENTOLIN HFA) 108 (90 BASE) MCG/ACT inhaler Inhale 2 puffs into the lungs every 6 (six) hours as needed for wheezing or shortness of breath.      . ALPRAZolam (XANAX) 0.5 MG tablet Take 0.5 mg by mouth at bedtime as needed for anxiety.      Marland Kitchen amLODipine (NORVASC) 5 MG  tablet Take 1 tablet (5 mg total) by mouth daily.  30 tablet  6  . aspirin EC 325 MG tablet Take 1 tablet (325 mg total) by mouth daily.  30 tablet  0  . Calcium-Vitamin D (CALTRATE 600 PLUS-VIT D PO) Take 1 tablet by mouth daily.      . furosemide (LASIX) 20 MG tablet Take 1 tablet (20 mg total) by mouth daily.  30 tablet  0  . levothyroxine (SYNTHROID, LEVOTHROID) 150 MCG tablet Take 1 tablet (150 mcg total) by mouth daily before breakfast.  30 tablet  11  . losartan (COZAAR) 100 MG tablet Take 100 mg by mouth daily.      . methocarbamol (ROBAXIN) 500 MG tablet Take 1-2 tablets (500-1,000 mg total) by mouth every 6 (six) hours as needed for muscle spasms.  60 tablet  2  . Omega-3 Fatty Acids (FISH OIL PO) Take 1 capsule by mouth daily.      Marland Kitchen omeprazole (PRILOSEC) 20 MG capsule Take 20 mg by mouth daily.      Marland Kitchen oxyCODONE (OXY IR/ROXICODONE) 5 MG immediate release tablet Take 1-2 tablets (5-10 mg total) by mouth every 3 (three) hours as needed for breakthrough pain.  90 tablet  0  . sertraline (ZOLOFT) 100 MG tablet Take 1 tablet (100 mg total) by mouth daily.  30 tablet  11  . Tetrahydrozoline HCl (VISINE OP) Apply 1 drop  to eye daily as needed.       No current facility-administered medications for this visit.    Allergies  Review of patient's allergies indicates no known allergies.  Electrocardiogram:  SR rate 90 PR 238  Otherwise normal   Assessment and Plan

## 2013-10-09 NOTE — Assessment & Plan Note (Signed)
Well controlled.  Continue current medications and low sodium Dash type diet.    

## 2013-10-09 NOTE — Patient Instructions (Signed)
Your physician recommends that you schedule a follow-up appointment in: AS NEEDED  Your physician recommends that you continue on your current medications as directed. Please refer to the Current Medication list given to you today.  

## 2013-10-09 NOTE — Assessment & Plan Note (Signed)
No active wheezing  Has had flu shot  Yearly CXR F/U Dr Renold Genta

## 2013-10-09 NOTE — Assessment & Plan Note (Signed)
Non recurrent no evidence of structural or electrical heart disease  Needs yearly ECG with Dr Renold Genta

## 2013-10-13 DIAGNOSIS — R262 Difficulty in walking, not elsewhere classified: Secondary | ICD-10-CM | POA: Diagnosis not present

## 2013-10-13 DIAGNOSIS — M25662 Stiffness of left knee, not elsewhere classified: Secondary | ICD-10-CM | POA: Diagnosis not present

## 2013-10-13 DIAGNOSIS — M25562 Pain in left knee: Secondary | ICD-10-CM | POA: Diagnosis not present

## 2013-10-13 DIAGNOSIS — Z96652 Presence of left artificial knee joint: Secondary | ICD-10-CM | POA: Diagnosis not present

## 2013-10-16 DIAGNOSIS — R262 Difficulty in walking, not elsewhere classified: Secondary | ICD-10-CM | POA: Diagnosis not present

## 2013-10-16 DIAGNOSIS — Z96652 Presence of left artificial knee joint: Secondary | ICD-10-CM | POA: Diagnosis not present

## 2013-10-16 DIAGNOSIS — M25662 Stiffness of left knee, not elsewhere classified: Secondary | ICD-10-CM | POA: Diagnosis not present

## 2013-10-16 DIAGNOSIS — M25562 Pain in left knee: Secondary | ICD-10-CM | POA: Diagnosis not present

## 2013-10-21 DIAGNOSIS — M25562 Pain in left knee: Secondary | ICD-10-CM | POA: Diagnosis not present

## 2013-10-21 DIAGNOSIS — Z9889 Other specified postprocedural states: Secondary | ICD-10-CM | POA: Insufficient documentation

## 2013-10-21 DIAGNOSIS — Z96652 Presence of left artificial knee joint: Secondary | ICD-10-CM | POA: Diagnosis not present

## 2013-10-21 DIAGNOSIS — R262 Difficulty in walking, not elsewhere classified: Secondary | ICD-10-CM | POA: Diagnosis not present

## 2013-10-21 DIAGNOSIS — M25662 Stiffness of left knee, not elsewhere classified: Secondary | ICD-10-CM | POA: Diagnosis not present

## 2013-10-23 DIAGNOSIS — Z96652 Presence of left artificial knee joint: Secondary | ICD-10-CM | POA: Diagnosis not present

## 2013-10-23 DIAGNOSIS — M25562 Pain in left knee: Secondary | ICD-10-CM | POA: Diagnosis not present

## 2013-10-23 DIAGNOSIS — R262 Difficulty in walking, not elsewhere classified: Secondary | ICD-10-CM | POA: Diagnosis not present

## 2013-10-23 DIAGNOSIS — M25662 Stiffness of left knee, not elsewhere classified: Secondary | ICD-10-CM | POA: Diagnosis not present

## 2013-10-28 ENCOUNTER — Encounter: Payer: Self-pay | Admitting: Internal Medicine

## 2013-10-28 ENCOUNTER — Ambulatory Visit (INDEPENDENT_AMBULATORY_CARE_PROVIDER_SITE_OTHER): Payer: Medicare Other | Admitting: Internal Medicine

## 2013-10-28 VITALS — BP 120/80 | HR 87 | Temp 98.9°F | Wt 165.0 lb

## 2013-10-28 DIAGNOSIS — H1033 Unspecified acute conjunctivitis, bilateral: Secondary | ICD-10-CM | POA: Diagnosis not present

## 2013-10-28 DIAGNOSIS — J0101 Acute recurrent maxillary sinusitis: Secondary | ICD-10-CM

## 2013-10-28 MED ORDER — TOBRAMYCIN-DEXAMETHASONE 0.3-0.1 % OP SUSP: 2.0000 [drp] | Freq: Four times a day (QID) | OPHTHALMIC | Status: DC

## 2013-10-28 MED ORDER — LEVOFLOXACIN 500 MG PO TABS: 500.0000 mg | ORAL_TABLET | Freq: Every day | ORAL | Status: DC

## 2013-10-28 NOTE — Patient Instructions (Addendum)
Tobradex ophthalmic solution 2 drops in each eye 4 times daily for 5-7 days. Take Levaquin as directed for 10 days.  Addendum: Patient could not afford this medication.: Ocuflox ophthalmic solution instead 2 drops in each eye 4 times daily for 5-7 days

## 2013-10-29 ENCOUNTER — Telehealth: Payer: Self-pay

## 2013-10-29 MED ORDER — OFLOXACIN 0.3 % OP SOLN: 2.0000 [drp] | Freq: Four times a day (QID) | OPHTHALMIC | Status: DC

## 2013-10-29 MED ORDER — PREDNISONE (PAK) 10 MG PO TABS: ORAL_TABLET | ORAL | Status: DC

## 2013-10-29 NOTE — Telephone Encounter (Signed)
Patient called regarding the eye drops she was prescribed.  She says they cost over $100.  She would like to know if there are any other options?  Advised patient Dr Renold Genta was not seeing patients today and that most eye drops do cost over $100.  Suggested patient call her insurance to see if they had any recommendations while waiting for a response from Dr Renold Genta.

## 2013-10-29 NOTE — Telephone Encounter (Signed)
Suggest  Prednisone 10 mg 6 day dosepack and ocuflox opthalmic drops 2 drops each eye 4 times a day instead.

## 2013-10-29 NOTE — Telephone Encounter (Signed)
Patient informed prednisone and eye drops sent to pharmacy.

## 2013-12-07 NOTE — Progress Notes (Signed)
   Subjective:    Patient ID: Terri Bridges, female    DOB: 04-Sep-1934, 79 y.o.   MRN: [id number]  HPI  Patient in today complaining of sinus infection symptoms. Has had recurrent maxillary sinus congestion. Also has developed conjunctivitis.    Review of Systems     Objective:   Physical Exam   Conjunctivae erythematous. TMs chronically scarred. Pharynx clear. Neck supple without adenopathy. Chest clear      Assessment & Plan:  Acute sinusitis  Conjunctivitis-bilateral  Plan: Tobra decks ophthalmic solution 4 times daily. Levaquin 500 milligrams daily for 10 days.  Addendum: Patient called following day and says she cannot afford Tobradex. Switched  To Ocuflox ophthalmic solution and use 2 drops in each eye 4 times daily for 5-7 days. Continue Levaquin for 10 day course.

## 2014-01-27 ENCOUNTER — Telehealth: Payer: Self-pay | Admitting: *Deleted

## 2014-01-27 ENCOUNTER — Ambulatory Visit
Admission: RE | Admit: 2014-01-27 | Discharge: 2014-01-27 | Disposition: A | Discharge status: Home / Self Care | Payer: Medicare Other | Source: Ambulatory Visit | Attending: Internal Medicine | Admitting: Internal Medicine

## 2014-01-27 ENCOUNTER — Encounter: Payer: Self-pay | Admitting: Internal Medicine

## 2014-01-27 ENCOUNTER — Ambulatory Visit (INDEPENDENT_AMBULATORY_CARE_PROVIDER_SITE_OTHER): Payer: Medicare Other | Admitting: Internal Medicine

## 2014-01-27 VITALS — BP 150/88 | HR 88 | Temp 97.7°F | Wt 166.0 lb

## 2014-01-27 DIAGNOSIS — E039 Hypothyroidism, unspecified: Secondary | ICD-10-CM

## 2014-01-27 DIAGNOSIS — I10 Essential (primary) hypertension: Secondary | ICD-10-CM

## 2014-01-27 DIAGNOSIS — R05 Cough: Secondary | ICD-10-CM

## 2014-01-27 DIAGNOSIS — R0989 Other specified symptoms and signs involving the circulatory and respiratory systems: Secondary | ICD-10-CM | POA: Diagnosis not present

## 2014-01-27 DIAGNOSIS — J441 Chronic obstructive pulmonary disease with (acute) exacerbation: Secondary | ICD-10-CM

## 2014-01-27 DIAGNOSIS — J0100 Acute maxillary sinusitis, unspecified: Secondary | ICD-10-CM

## 2014-01-27 DIAGNOSIS — R059 Cough, unspecified: Secondary | ICD-10-CM

## 2014-01-27 DIAGNOSIS — K449 Diaphragmatic hernia without obstruction or gangrene: Secondary | ICD-10-CM | POA: Diagnosis not present

## 2014-01-27 DIAGNOSIS — J209 Acute bronchitis, unspecified: Secondary | ICD-10-CM | POA: Diagnosis not present

## 2014-01-27 DIAGNOSIS — F4321 Adjustment disorder with depressed mood: Secondary | ICD-10-CM

## 2014-01-27 LAB — CBC WITH DIFFERENTIAL/PLATELET
BASOS PCT: 0 % (ref 0–1)
Basophils Absolute: 0 10*3/uL (ref 0.0–0.1)
EOS ABS: 0.4 10*3/uL (ref 0.0–0.7)
EOS PCT: 7 % — AB (ref 0–5)
HEMATOCRIT: 38.1 % (ref 36.0–46.0)
HEMOGLOBIN: 12.2 g/dL (ref 12.0–15.0)
LYMPHS ABS: 1.3 10*3/uL (ref 0.7–4.0)
Lymphocytes Relative: 23 % (ref 12–46)
MCH: 25.1 pg — AB (ref 26.0–34.0)
MCHC: 32 g/dL (ref 30.0–36.0)
MCV: 78.2 fL (ref 78.0–100.0)
MPV: 9.8 fL (ref 8.6–12.4)
Monocytes Absolute: 0.6 10*3/uL (ref 0.1–1.0)
Monocytes Relative: 11 % (ref 3–12)
Neutro Abs: 3.2 10*3/uL (ref 1.7–7.7)
Neutrophils Relative %: 59 % (ref 43–77)
Platelets: 193 10*3/uL (ref 150–400)
RBC: 4.87 MIL/uL (ref 3.87–5.11)
RDW: 15.1 % (ref 11.5–15.5)
WBC: 5.5 10*3/uL (ref 4.0–10.5)

## 2014-01-27 LAB — BASIC METABOLIC PANEL
BUN: 24 mg/dL — ABNORMAL HIGH (ref 6–23)
CO2: 25 mEq/L (ref 19–32)
Calcium: 9 mg/dL (ref 8.4–10.5)
Chloride: 104 mEq/L (ref 96–112)
Creat: 1.43 mg/dL — ABNORMAL HIGH (ref 0.50–1.10)
GLUCOSE: 68 mg/dL — AB (ref 70–99)
POTASSIUM: 4.5 meq/L (ref 3.5–5.3)
Sodium: 140 mEq/L (ref 135–145)

## 2014-01-27 LAB — TSH: TSH: 0.042 u[IU]/mL — ABNORMAL LOW (ref 0.350–4.500)

## 2014-01-27 MED ORDER — PREDNISONE 10 MG PO TABS: ORAL_TABLET | ORAL | Status: DC

## 2014-01-27 MED ORDER — OXYCODONE HCL 5 MG PO TABS: 5.0000 mg | ORAL_TABLET | ORAL | Status: DC | PRN

## 2014-01-27 MED ORDER — LEVOFLOXACIN 500 MG PO TABS: 500.0000 mg | ORAL_TABLET | Freq: Every day | ORAL | Status: DC

## 2014-01-27 NOTE — Patient Instructions (Addendum)
Take Levaquin 500 mg daily x 10 days.  Take prednisone as directed. Have CXR. Stay at home and rest for remainder of week.

## 2014-01-27 NOTE — Progress Notes (Signed)
   Subjective:    Patient ID: Terri Bridges, female    DOB: [birth year], 80 y.o.   MRN: [id number]  HPI   Sister died 40 weeks ago in [place] at 39 years old.   Was found dead at home after a bout of shingles.   Was traveling there to help her. Came down with URI. Has been SOB and using nebulizer more. Has  Also been a bit dizzy. Labs pending. Discolored sputum.  Took BP meds at 7am. Other sister has scleroderma. Working for a retired [employer]. No fever or chills. Had flu vaccine. Has been wheezing. Complaining of more back pain recently. Request pain medication  Review of Systems     Objective:   Physical Exam  Skin warm and dry. Coughing a lot in office. TMs scarred. Pharynx clear. Neck supple Chest clear. Ext: without edema. Pulse ox is 92%. Cardiac exam regular rate and rhythm normal S1 and S2. Alert and oriented 3. No focal deficits on brief neurological exam      Assessment & Plan:  Sinusitis  Bronchitis  Hypothyroidism  HTN- elevated today due to respiratory distress. Coughing a lot in office.  Back pain-patient request refill on oxycodone APAP  Recent episode of vertigo-resolved within 1 day  Grief reaction secondary to sister's death  Plan: Levaquin 500 mg daily x 10 days           Prednisone 10 mg 12 day taper.           CXR            Do not work rest of week.           Continue same blood pressure medication.  25 minutes spent with patient including examining patient, counseling regarding grief reaction, and coordination of care

## 2014-01-27 NOTE — Telephone Encounter (Signed)
Patient returned call reviewed chest xray results with patient

## 2014-01-28 ENCOUNTER — Telehealth: Payer: Self-pay | Admitting: *Deleted

## 2014-01-28 NOTE — Telephone Encounter (Signed)
Reviewed lab work with patient . She will come in next Monday or Tuesday for repeat BMET as ordered by Dr Renold Genta

## 2014-02-03 ENCOUNTER — Other Ambulatory Visit: Payer: Medicare Other | Admitting: Internal Medicine

## 2014-02-03 DIAGNOSIS — R7989 Other specified abnormal findings of blood chemistry: Secondary | ICD-10-CM

## 2014-02-03 DIAGNOSIS — R799 Abnormal finding of blood chemistry, unspecified: Secondary | ICD-10-CM | POA: Diagnosis not present

## 2014-02-03 LAB — BASIC METABOLIC PANEL
BUN: 28 mg/dL — ABNORMAL HIGH (ref 6–23)
CO2: 29 meq/L (ref 19–32)
Calcium: 9.1 mg/dL (ref 8.4–10.5)
Chloride: 103 mEq/L (ref 96–112)
Creat: 1.24 mg/dL — ABNORMAL HIGH (ref 0.50–1.10)
Glucose, Bld: 83 mg/dL (ref 70–99)
POTASSIUM: 3.9 meq/L (ref 3.5–5.3)
SODIUM: 141 meq/L (ref 135–145)

## 2014-02-04 ENCOUNTER — Telehealth: Payer: Self-pay | Admitting: *Deleted

## 2014-02-06 NOTE — Telephone Encounter (Signed)
Reviewed lab results with patient.

## 2014-04-01 ENCOUNTER — Other Ambulatory Visit: Payer: Self-pay | Admitting: *Deleted

## 2014-04-01 ENCOUNTER — Telehealth: Payer: Self-pay | Admitting: Internal Medicine

## 2014-04-01 MED ORDER — MAGIC MOUTHWASH: 10.0000 mL | Freq: Four times a day (QID) | ORAL | Status: DC

## 2014-04-01 NOTE — Telephone Encounter (Signed)
Stop brushing tongue. Use magic mouthwash 2 teaspoons 4 times daily swish do not swallow. Call if no better in 7-10 days.

## 2014-04-01 NOTE — Telephone Encounter (Signed)
States she has had these raised areas on the "BACK" of her tongue that are red/raised for the past 2 weeks.  They are not really "sore".  She has tried salt water, she has tried brushing them with orajel/peroxyl wash.  States it isn't painful, it is just aggravating.  She wanted to know if something like magic mouthwash would help it.  It is not white at all, it's just lil raised red bumps.  Advised that you most likely would need to see them in order to prescribe something for her.    Pharmacy:  [facility])

## 2014-04-01 NOTE — Telephone Encounter (Signed)
Reviewed instructions with patient . Script sent for [employer] to patient pharmacy

## 2014-04-01 NOTE — Telephone Encounter (Signed)
Resent script for magic mouthwash pharmacy didn't receive first order

## 2014-04-07 ENCOUNTER — Other Ambulatory Visit: Payer: Self-pay | Admitting: Internal Medicine

## 2014-05-22 ENCOUNTER — Ambulatory Visit (INDEPENDENT_AMBULATORY_CARE_PROVIDER_SITE_OTHER): Payer: Medicare Other | Admitting: Internal Medicine

## 2014-05-22 ENCOUNTER — Encounter: Payer: Self-pay | Admitting: Internal Medicine

## 2014-05-22 ENCOUNTER — Ambulatory Visit
Admission: RE | Admit: 2014-05-22 | Discharge: 2014-05-22 | Disposition: A | Discharge status: Home / Self Care | Payer: Medicare Other | Source: Ambulatory Visit | Attending: Internal Medicine | Admitting: Internal Medicine

## 2014-05-22 VITALS — BP 132/74 | HR 94 | Temp 97.7°F | Ht 64.0 in | Wt 167.0 lb

## 2014-05-22 DIAGNOSIS — N183 Chronic kidney disease, stage 3 unspecified: Secondary | ICD-10-CM

## 2014-05-22 DIAGNOSIS — I10 Essential (primary) hypertension: Secondary | ICD-10-CM | POA: Diagnosis not present

## 2014-05-22 DIAGNOSIS — E039 Hypothyroidism, unspecified: Secondary | ICD-10-CM | POA: Diagnosis not present

## 2014-05-22 DIAGNOSIS — R062 Wheezing: Secondary | ICD-10-CM | POA: Diagnosis not present

## 2014-05-22 DIAGNOSIS — R05 Cough: Secondary | ICD-10-CM | POA: Diagnosis not present

## 2014-05-22 DIAGNOSIS — R7989 Other specified abnormal findings of blood chemistry: Secondary | ICD-10-CM

## 2014-05-22 DIAGNOSIS — R946 Abnormal results of thyroid function studies: Secondary | ICD-10-CM | POA: Diagnosis not present

## 2014-05-22 DIAGNOSIS — J441 Chronic obstructive pulmonary disease with (acute) exacerbation: Secondary | ICD-10-CM | POA: Diagnosis not present

## 2014-05-22 DIAGNOSIS — N189 Chronic kidney disease, unspecified: Secondary | ICD-10-CM | POA: Insufficient documentation

## 2014-05-22 LAB — TSH: TSH: 0.031 u[IU]/mL — ABNORMAL LOW (ref 0.350–4.500)

## 2014-05-22 MED ORDER — PREDNISONE 10 MG PO TABS: ORAL_TABLET | ORAL | Status: DC

## 2014-05-22 MED ORDER — CEFTRIAXONE SODIUM 1 G IJ SOLR
1.0000 g | Freq: Once | INTRAMUSCULAR | Status: AC
  Administered 2014-05-22: 1 g via INTRAMUSCULAR

## 2014-05-22 MED ORDER — LEVOFLOXACIN 500 MG PO TABS: 500.0000 mg | ORAL_TABLET | Freq: Every day | ORAL | Status: DC

## 2014-05-22 NOTE — Patient Instructions (Addendum)
Rocephin 1 g IM. Sterapred DS 10 mg 12 day dosepak. Levaquin 500 milligrams daily for 10 days. Symbicort inhaler 161 spray by mouth every 12 hours. Albuterol inhaler 2 sprays by mouth 4 times a day or albuterol nebulizer treatment 4 times a day. TSH rechecked from January.

## 2014-05-22 NOTE — Progress Notes (Signed)
   Subjective:    Patient ID: Terri Bridges, female    DOB: May 07, 1934, 80 y.o.   MRN: [id number]  HPI  Has been having respiratory distress at least since last week some time. She is very vague about the history but apparently has been coughing for several weeks. Has gotten worse with acute bronchospasm onset latter part of last week. She got an albuterol inhaler from a relative yesterday. She does have a prescription for her own albuterol inhaler. Does not have prescription for Symbicort or Advair. History of exacerbations of COPD. History of chronic recurrent sinusitis. No discolored sputum. No fever or shaking chills. Chest x-ray done today shows large child will hernia but no heart failure or pneumonia. Has albuterol nebulizer device at home which she has used some.  History of low TSH January 2016. This will be repeated today. She is on thyroid replacement therapy.  History of chronic kidney disease.    Review of Systems     Objective:   Physical Exam  Skin warm and dry. Nodes none. Pharynx is clear. TMs are chronically scarred. Neck is supple without adenopathy JVD or thyromegaly. Chest clear to auscultation after coughing. Initially when she presented to the office she had bilateral rhonchi and wheezing.      Assessment & Plan:  Exacerbation of COPD  Bronchitis  Hypothyroidism  Low TSH January 2016  Chronic kidney disease with elevated serum creatinine  Plan: TSH will be repeated. Symbicort inhaler 160  one spray by mouth every 12 hours. Albuterol nebulizer or inhaler 4 times daily. Rocephin 1 g IM. Sterapred DS 10 mg 12 day dosepak. Levaquin 500 milligrams daily for 10 days.

## 2014-05-25 ENCOUNTER — Telehealth: Payer: Self-pay | Admitting: *Deleted

## 2014-05-25 MED ORDER — LEVOTHYROXINE SODIUM 137 MCG PO TABS: 137.0000 ug | ORAL_TABLET | Freq: Every day | ORAL | Status: DC

## 2014-05-25 NOTE — Telephone Encounter (Signed)
Patient notified regarding levothyroxine dose change. Patient scheduled for repeat TSH on July 20, 2014

## 2014-06-22 ENCOUNTER — Other Ambulatory Visit: Payer: Self-pay | Admitting: *Deleted

## 2014-06-22 MED ORDER — ALBUTEROL SULFATE HFA 108 (90 BASE) MCG/ACT IN AERS: 2.0000 | INHALATION_SPRAY | Freq: Four times a day (QID) | RESPIRATORY_TRACT | Status: DC | PRN

## 2014-06-23 ENCOUNTER — Other Ambulatory Visit: Payer: Self-pay | Admitting: *Deleted

## 2014-06-23 MED ORDER — ALBUTEROL SULFATE (2.5 MG/3ML) 0.083% IN NEBU: 2.5000 mg | INHALATION_SOLUTION | Freq: Four times a day (QID) | RESPIRATORY_TRACT | Status: DC | PRN

## 2014-06-23 NOTE — Telephone Encounter (Signed)
Refilled albuterol neb solution

## 2014-07-11 ENCOUNTER — Other Ambulatory Visit: Payer: Self-pay | Admitting: Internal Medicine

## 2014-07-20 ENCOUNTER — Other Ambulatory Visit: Payer: Medicare Other | Admitting: Internal Medicine

## 2014-07-20 DIAGNOSIS — E039 Hypothyroidism, unspecified: Secondary | ICD-10-CM

## 2014-07-20 LAB — TSH: TSH: 0.052 u[IU]/mL — ABNORMAL LOW (ref 0.350–4.500)

## 2014-08-20 ENCOUNTER — Other Ambulatory Visit: Payer: Self-pay | Admitting: Internal Medicine

## 2014-09-12 ENCOUNTER — Other Ambulatory Visit: Payer: Self-pay | Admitting: Internal Medicine

## 2014-09-17 ENCOUNTER — Encounter: Payer: Self-pay | Admitting: Internal Medicine

## 2014-09-17 ENCOUNTER — Ambulatory Visit (INDEPENDENT_AMBULATORY_CARE_PROVIDER_SITE_OTHER): Payer: Medicare Other | Admitting: Internal Medicine

## 2014-09-17 VITALS — BP 136/76 | HR 75 | Temp 97.9°F | Wt 161.0 lb

## 2014-09-17 DIAGNOSIS — J0100 Acute maxillary sinusitis, unspecified: Secondary | ICD-10-CM | POA: Diagnosis not present

## 2014-09-17 DIAGNOSIS — E039 Hypothyroidism, unspecified: Secondary | ICD-10-CM | POA: Diagnosis not present

## 2014-09-17 DIAGNOSIS — J209 Acute bronchitis, unspecified: Secondary | ICD-10-CM | POA: Diagnosis not present

## 2014-09-17 DIAGNOSIS — R946 Abnormal results of thyroid function studies: Secondary | ICD-10-CM

## 2014-09-17 DIAGNOSIS — R7989 Other specified abnormal findings of blood chemistry: Secondary | ICD-10-CM

## 2014-09-17 MED ORDER — LEVOFLOXACIN 500 MG PO TABS: 500.0000 mg | ORAL_TABLET | Freq: Every day | ORAL | Status: DC

## 2014-09-17 MED ORDER — PREDNISONE 10 MG PO TABS: ORAL_TABLET | ORAL | Status: DC

## 2014-09-22 ENCOUNTER — Other Ambulatory Visit: Payer: Self-pay | Admitting: Internal Medicine

## 2014-10-09 ENCOUNTER — Encounter: Payer: Self-pay | Admitting: Internal Medicine

## 2014-10-09 NOTE — Patient Instructions (Addendum)
Take Sterapred and Levaquin as directed. To have repeat TSH in the near future. Has nebulizer at home for home nebulizer treatments. Use either nebulizer or albuterol inhaler.

## 2014-10-09 NOTE — Progress Notes (Signed)
   Subjective:    Patient ID: Terri Bridges, female    DOB: March 15, 1934, 80 y.o.   MRN: [id number]  HPI In today with acute URI symptoms and sinusitis. History of chronic recurrent sinusitis. Sounds nasally congested. Apparently has been ill for almost 2 weeks and did not seek medical attention until today. Some shortness of breath. Cough with discolored sputum and discolored nasal drainage. History of hypertension, hypothyroidism, recurrent sinusitis, allergic rhinitis, GE reflux, COPD, depression. History of anxiety. Chronic malaise and fatigue.    Review of Systems     Objective:   Physical Exam  Skin warm and dry. Nodes none. TMs are chronically scarred. Pharynx is clear. Neck is supple without adenopathy. Chest clear to auscultation without rales or wheezing.      Assessment & Plan:  Acute sinusitis  Acute bronchitis  Recurrent sinusitis  Anxiety depression  Hypertension  Hypothyroidism-recent TSH was low and needs to be repeated  Plan: Sterapred DS 10 mg 6 day dosepak. Levaquin 500 milligrams daily for 10 days. To have repeat TSH in the near future.

## 2014-10-16 DIAGNOSIS — H10021 Other mucopurulent conjunctivitis, right eye: Secondary | ICD-10-CM | POA: Diagnosis not present

## 2014-10-19 DIAGNOSIS — H10413 Chronic giant papillary conjunctivitis, bilateral: Secondary | ICD-10-CM | POA: Diagnosis not present

## 2014-10-26 DIAGNOSIS — H10413 Chronic giant papillary conjunctivitis, bilateral: Secondary | ICD-10-CM | POA: Diagnosis not present

## 2014-10-29 DIAGNOSIS — Z23 Encounter for immunization: Secondary | ICD-10-CM | POA: Diagnosis not present

## 2014-11-09 DIAGNOSIS — H04123 Dry eye syndrome of bilateral lacrimal glands: Secondary | ICD-10-CM | POA: Diagnosis not present

## 2014-11-09 DIAGNOSIS — H1033 Unspecified acute conjunctivitis, bilateral: Secondary | ICD-10-CM | POA: Diagnosis not present

## 2014-11-12 ENCOUNTER — Other Ambulatory Visit: Payer: Medicare Other | Admitting: Internal Medicine

## 2014-11-12 DIAGNOSIS — E039 Hypothyroidism, unspecified: Secondary | ICD-10-CM

## 2014-11-12 DIAGNOSIS — R7989 Other specified abnormal findings of blood chemistry: Secondary | ICD-10-CM

## 2014-11-12 DIAGNOSIS — R946 Abnormal results of thyroid function studies: Secondary | ICD-10-CM | POA: Diagnosis not present

## 2014-11-12 LAB — TSH: TSH: 0.117 u[IU]/mL — AB (ref 0.350–4.500)

## 2014-11-13 ENCOUNTER — Telehealth: Payer: Self-pay

## 2014-11-13 ENCOUNTER — Other Ambulatory Visit: Payer: Self-pay | Admitting: Internal Medicine

## 2014-11-13 MED ORDER — LEVOTHYROXINE SODIUM 125 MCG PO TABS: 125.0000 ug | ORAL_TABLET | Freq: Every day | ORAL | Status: DC

## 2014-11-13 NOTE — Telephone Encounter (Signed)
-----   Message from Elby Showers, MD sent at 11/13/2014  2:24 PM EDT ----- Decrease Levothyroxine to 0.125 mg daily and follow up with CPE in January. TSH is too low meaning dose of Levothyroxine is too strong.  Ideally TSH should be about 1.00

## 2014-11-30 DIAGNOSIS — H10413 Chronic giant papillary conjunctivitis, bilateral: Secondary | ICD-10-CM | POA: Diagnosis not present

## 2015-01-18 ENCOUNTER — Other Ambulatory Visit: Payer: Medicare Other | Admitting: Internal Medicine

## 2015-01-19 ENCOUNTER — Other Ambulatory Visit: Payer: Medicare Other | Admitting: Internal Medicine

## 2015-01-19 DIAGNOSIS — Z79899 Other long term (current) drug therapy: Secondary | ICD-10-CM | POA: Diagnosis not present

## 2015-01-19 DIAGNOSIS — Z0000 Encounter for general adult medical examination without abnormal findings: Secondary | ICD-10-CM

## 2015-01-19 DIAGNOSIS — E039 Hypothyroidism, unspecified: Secondary | ICD-10-CM | POA: Diagnosis not present

## 2015-01-19 DIAGNOSIS — I10 Essential (primary) hypertension: Secondary | ICD-10-CM | POA: Diagnosis not present

## 2015-01-19 DIAGNOSIS — M818 Other osteoporosis without current pathological fracture: Secondary | ICD-10-CM

## 2015-01-19 DIAGNOSIS — M8580 Other specified disorders of bone density and structure, unspecified site: Secondary | ICD-10-CM | POA: Diagnosis not present

## 2015-01-19 LAB — COMPLETE METABOLIC PANEL WITH GFR
ALBUMIN: 4.2 g/dL (ref 3.6–5.1)
ALK PHOS: 75 U/L (ref 33–130)
ALT: 13 U/L (ref 6–29)
AST: 17 U/L (ref 10–35)
BUN: 19 mg/dL (ref 7–25)
CALCIUM: 9.3 mg/dL (ref 8.6–10.4)
CO2: 27 mmol/L (ref 20–31)
Chloride: 105 mmol/L (ref 98–110)
Creat: 0.99 mg/dL — ABNORMAL HIGH (ref 0.60–0.93)
GFR, EST AFRICAN AMERICAN: 63 mL/min (ref >=60)
GFR, EST NON AFRICAN AMERICAN: 55 mL/min — AB (ref >=60)
Glucose, Bld: 93 mg/dL (ref 65–99)
Potassium: 4 mmol/L (ref 3.5–5.3)
Sodium: 143 mmol/L (ref 135–146)
Total Bilirubin: 0.5 mg/dL (ref 0.2–1.2)
Total Protein: 6.6 g/dL (ref 6.1–8.1)

## 2015-01-19 LAB — CBC WITH DIFFERENTIAL/PLATELET
Basophils Absolute: 0.1 10*3/uL (ref 0.0–0.1)
Basophils Relative: 1 % (ref 0–1)
EOS PCT: 8 % — AB (ref 0–5)
Eosinophils Absolute: 0.4 10*3/uL (ref 0.0–0.7)
HEMATOCRIT: 40.6 % (ref 36.0–46.0)
HEMOGLOBIN: 13.6 g/dL (ref 12.0–15.0)
LYMPHS ABS: 1.4 10*3/uL (ref 0.7–4.0)
Lymphocytes Relative: 25 % (ref 12–46)
MCH: 26.4 pg (ref 26.0–34.0)
MCHC: 33.5 g/dL (ref 30.0–36.0)
MCV: 78.7 fL (ref 78.0–100.0)
MONO ABS: 0.4 10*3/uL (ref 0.1–1.0)
MPV: 10.1 fL (ref 8.6–12.4)
Monocytes Relative: 8 % (ref 3–12)
Neutro Abs: 3.2 10*3/uL (ref 1.7–7.7)
Neutrophils Relative %: 58 % (ref 43–77)
Platelets: 177 10*3/uL (ref 150–400)
RBC: 5.16 MIL/uL — ABNORMAL HIGH (ref 3.87–5.11)
RDW: 14.8 % (ref 11.5–15.5)
WBC: 5.5 10*3/uL (ref 4.0–10.5)

## 2015-01-19 LAB — LIPID PANEL
CHOLESTEROL: 217 mg/dL — AB (ref 125–200)
HDL: 70 mg/dL (ref >=46)
LDL Cholesterol: 133 mg/dL — ABNORMAL HIGH (ref <130)
Total CHOL/HDL Ratio: 3.1 Ratio (ref <=5.0)
Triglycerides: 70 mg/dL (ref <150)
VLDL: 14 mg/dL (ref <30)

## 2015-01-19 LAB — TSH: TSH: 0.527 u[IU]/mL (ref 0.350–4.500)

## 2015-01-20 LAB — VITAMIN D 25 HYDROXY (VIT D DEFICIENCY, FRACTURES): Vit D, 25-Hydroxy: 35 ng/mL (ref 30–100)

## 2015-01-21 ENCOUNTER — Ambulatory Visit (INDEPENDENT_AMBULATORY_CARE_PROVIDER_SITE_OTHER): Payer: Medicare Other | Admitting: Internal Medicine

## 2015-01-21 ENCOUNTER — Encounter: Payer: Self-pay | Admitting: Internal Medicine

## 2015-01-21 VITALS — BP 130/80 | HR 75 | Temp 98.0°F | Resp 22 | Ht 60.5 in | Wt 164.0 lb

## 2015-01-21 DIAGNOSIS — Z8709 Personal history of other diseases of the respiratory system: Secondary | ICD-10-CM

## 2015-01-21 DIAGNOSIS — M810 Age-related osteoporosis without current pathological fracture: Secondary | ICD-10-CM | POA: Diagnosis not present

## 2015-01-21 DIAGNOSIS — M1711 Unilateral primary osteoarthritis, right knee: Secondary | ICD-10-CM

## 2015-01-21 DIAGNOSIS — E039 Hypothyroidism, unspecified: Secondary | ICD-10-CM | POA: Diagnosis not present

## 2015-01-21 DIAGNOSIS — K219 Gastro-esophageal reflux disease without esophagitis: Secondary | ICD-10-CM

## 2015-01-21 DIAGNOSIS — E785 Hyperlipidemia, unspecified: Secondary | ICD-10-CM | POA: Diagnosis not present

## 2015-01-21 DIAGNOSIS — F419 Anxiety disorder, unspecified: Secondary | ICD-10-CM

## 2015-01-21 DIAGNOSIS — I10 Essential (primary) hypertension: Secondary | ICD-10-CM | POA: Diagnosis not present

## 2015-01-21 DIAGNOSIS — Z23 Encounter for immunization: Secondary | ICD-10-CM | POA: Diagnosis not present

## 2015-01-21 DIAGNOSIS — J0100 Acute maxillary sinusitis, unspecified: Secondary | ICD-10-CM | POA: Diagnosis not present

## 2015-01-21 DIAGNOSIS — G8929 Other chronic pain: Secondary | ICD-10-CM | POA: Diagnosis not present

## 2015-01-21 DIAGNOSIS — F329 Major depressive disorder, single episode, unspecified: Secondary | ICD-10-CM

## 2015-01-21 DIAGNOSIS — N183 Chronic kidney disease, stage 3 unspecified: Secondary | ICD-10-CM

## 2015-01-21 DIAGNOSIS — Z96652 Presence of left artificial knee joint: Secondary | ICD-10-CM

## 2015-01-21 DIAGNOSIS — M549 Dorsalgia, unspecified: Secondary | ICD-10-CM | POA: Diagnosis not present

## 2015-01-21 DIAGNOSIS — F418 Other specified anxiety disorders: Secondary | ICD-10-CM | POA: Diagnosis not present

## 2015-01-21 DIAGNOSIS — J309 Allergic rhinitis, unspecified: Secondary | ICD-10-CM | POA: Diagnosis not present

## 2015-01-21 DIAGNOSIS — Z0000 Encounter for general adult medical examination without abnormal findings: Secondary | ICD-10-CM | POA: Diagnosis not present

## 2015-01-21 LAB — POCT URINALYSIS DIPSTICK
Bilirubin, UA: NEGATIVE
Glucose, UA: NEGATIVE
Ketones, UA: NEGATIVE
Leukocytes, UA: NEGATIVE
Nitrite, UA: NEGATIVE
PROTEIN UA: NEGATIVE
RBC UA: NEGATIVE
SPEC GRAV UA: 1.01
UROBILINOGEN UA: 0.2
pH, UA: 7

## 2015-01-21 MED ORDER — LEVOFLOXACIN 500 MG PO TABS: 500.0000 mg | ORAL_TABLET | Freq: Every day | ORAL | Status: DC

## 2015-01-21 MED ORDER — MELOXICAM 15 MG PO TABS: 15.0000 mg | ORAL_TABLET | Freq: Every day | ORAL | Status: DC

## 2015-01-21 MED ORDER — PREDNISONE 10 MG PO TABS: ORAL_TABLET | ORAL | Status: DC

## 2015-01-21 NOTE — Progress Notes (Signed)
Subjective:    Patient ID: Terri Bridges, female    DOB: 01-30-1934, 81 y.o.   MRN: [id number]  HPI 81 year old White Female for health maintenance exam and evaluation of medical issues. Now has acute back pain for several months. Right side worse than left side.  She.thinks she may need right knee replacement in the near future. Doesn't really want to have surgery until Spring if necessary. In the Spring of 2015 she had total left knee arthroplasty by Dr. Lorre Nick. 4 months later, she had revision of total left knee arthroplasty due to instability with polyethylene exchange. She's done well.  Additional medical problems include: hypertension, hypothyroidism, allergic rhinitis, GE reflux, allergic rhinitis, chronic recurrent sinusitis, anxiety depression, COPD, osteoporosis.  History of bilateral spheno-ethmoidectomies, bilateral nasal polypectomies, bilateral maxillary antrostomies in 1991 by Dr. Purcell Nails. Right intranasal ethmoidectomy, drainage of right periorbital abscess March 31, 1991. Left ethmoidectomy, left sphenoidectomy, transnasal frontal sinusotomy April 07, 1991.  Arthroscopic surgery right knee February 2004. Bilateral grommet tubes placed February 2004. Left cataract extraction January 2013. Right rotator cuff repair 2011.  She had colonoscopy by Dr. Oletta Lamas December 2008. She had upper GI endoscopy at the same time showing a hiatal hernia.  History of asthma and allergic rhinitis treated by Dr. Neldon Mc.  History of exercise tolerance test to evaluate chest pain by Dr. Doreatha Lew November 1990. Study was clinically and electrocardiographically negative. Dr. Verl Blalock saw her in 2005 for palpitations. He placed her on Cardizem with improvement in blood pressure and palpitations. She had a cardiac catheterization at that time showing no significant coronary artery disease. Ejection fraction was 65% with a hyperdynamic ventricle. Patient had osteoporosis screening February 2013 at [facility] with T  score in left femoral neck -2.5 and T score in L4 being -2.5. Because of GE reflux it was not likely she would tolerate Fosamax. She was on Actonel for while in 2002 but it was discontinued. Calcium and vitamin D supplements have been recommended.  She has difficulty affording many medications and we have given her many samples.  She is a former heavy smoker having smoked 1-2 packs cigarettes daily for some 10 years. She quit smoking in 1993.  Social history: Patient resides with her granddaughter who has a history of leg deformity and has had multiple surgeries. Patient is divorced. 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 Prior to that she worked at [employer]. 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 She struggles financially. She does not consume alcohol. Quit smoking in 1993. In the summer of 2014, her son arrested suddenly in her backyard and subsequently expired presumably of an MI. He had a prior history of drug abuse.  Family history: Father died at age 12 of an MI. Mother died at age 71 of an MI.       Review of Systems  Constitutional: Positive for fatigue.  HENT:       Acute sinusitis symptoms today  Respiratory: Positive for cough.   Cardiovascular: Negative.   Allergic/Immunologic: Positive for environmental allergies.  Psychiatric/Behavioral:       Chronic anxiety depression       Objective:   Physical Exam  Constitutional: She is oriented to person, place, and time. She appears well-developed and well-nourished. No distress.  HENT:  Head: Normocephalic and atraumatic.  Right Ear: External ear normal.  Left Ear: External ear normal.  Mouth/Throat: Oropharynx is  clear and moist. No oropharyngeal exudate.  Eyes: Conjunctivae and EOM are normal. Pupils are equal, round, and reactive to light. Right eye exhibits no discharge. Left eye exhibits no discharge.  No scleral icterus.  Neck: Neck supple. No JVD present. No thyromegaly present.  Cardiovascular: Normal rate, regular rhythm, normal heart sounds and intact distal pulses.   No murmur heard. Pulmonary/Chest: Effort normal and breath sounds normal. No respiratory distress. She has no wheezes. She has no rales. She exhibits no tenderness.  Breasts normal female without masses  Abdominal: Soft. Bowel sounds are normal. She exhibits no distension and no mass. There is no tenderness. There is no rebound and no guarding.  Genitourinary:  Deferred  Musculoskeletal: Normal range of motion. She exhibits no edema.  Lymphadenopathy:    She has no cervical adenopathy.  Neurological: She is alert and oriented to person, place, and time. She has normal reflexes. No cranial nerve deficit.  Skin: Skin is warm and dry. No rash noted. She is not diaphoretic.  Psychiatric: She has a normal mood and affect. Her behavior is normal. Judgment and thought content normal.  Dysthymic  Vitals reviewed.         Assessment & Plan:  Essential hypertension-treated with Lasix, amlodipine, losartan  Hypothyroidism-on thyroid replacement therapy  COPD-has albuterol inhaler  GE reflux-treat with PPI  Allergic rhinitis  History of chronic and recurrent sinusitis  Anxiety depression-treated with Xanax and Zoloft  Osteoarthritis-trial of meloxicam. Patient likely to have right knee replacement Spring 2017  Acute sinusitis-treat with Levaquin for 10 days and prednisone  Back pain-trial of meloxicam  Elevated serum creatinine-likely has chronic kidney disease from years of hypertension. Suspect his chronic kidney disease stage III. It is being monitored.  Hyperlipidemia-treat with diet  Plan: Sterapred DS 10 mg 6 day dosepak. Levaquin 500 milligrams daily for 10 days. For right knee osteoarthritis symptoms and back pain try Meloxicam daily after completing course of prednisone. Return in 6 months for  follow-up.  Subjective:   Patient presents for Medicare Annual/Subsequent preventive examination.  Review Past Medical/Family/Social: See above   Risk Factors  Current exercise habits: Sedentary Dietary issues discussed: Low fat low carbohydrate  Cardiac risk factors: Hyperlipidemia, family history, history of smoking  Depression Screen  (Note: if answer to either of the following is "Yes", a more complete depression screening is indicated)   Over the past two weeks, have you felt down, depressed or hopeless? yes Over the past two weeks, have you felt little interest or pleasure in doing things?yes Have you lost interest or pleasure in daily life? yes Do you often feel hopeless? Sometimes Do you cry easily over simple problems? Sometimes  Activities of Daily Living  In your present state of health, do you have any difficulty performing the following activities?:   Driving? No  Managing money? No  Feeding yourself? No  Getting from bed to chair? No  Climbing a flight of stairs? No  Preparing food and eating?: No  Bathing or showering? No  Getting dressed: No  Getting to the toilet? No  Using the toilet:No  Moving around from place to place: No  In the past year have you fallen or had a near fall?: Yes Are you sexually active? No  Do you have more than one partner? No   Hearing Difficulties: No  Do you often ask people to speak up or repeat themselves? Yes Do you experience ringing or noises in your ears? Yes Do you have difficulty understanding soft  or whispered voices? Yes Do you feel that you have a problem with memory? Yes Do you often misplace items? Yes   Home Safety:  Do you have a smoke alarm at your residence? Yes Do you have grab bars in the bathroom? No Do you have throw rugs in your house? No   Cognitive Testing  Alert? Yes Normal Appearance?Yes  Oriented to person? Yes Place? Yes  Time? Yes  Recall of three objects? Yes  Can perform simple  calculations? Yes  Displays appropriate judgment?Yes  Can read the correct time from a watch face?Yes   List the Names of Other Physician/Practitioners you currently use:  See referral list for the physicians patient is currently seeing.  Orthopedist   Review of Systems: See above   Objective:     General appearance: Appears stated age and mildly obese  Head: Normocephalic, without obvious abnormality, atraumatic  Eyes: conj clear, EOMi PEERLA  Ears: normal TM's and external ear canals both ears  Nose: Nares normal. Septum midline. Mucosa normal. No drainage or sinus tenderness.  Throat: lips, mucosa, and tongue normal; teeth and gums normal  Neck: no adenopathy, no carotid bruit, no JVD, supple, symmetrical, trachea midline and thyroid not enlarged, symmetric, no tenderness/mass/nodules  No CVA tenderness.  Lungs: clear to auscultation bilaterally  Breasts: normal appearance, no masses or tenderness Heart: regular rate and rhythm, S1, S2 normal, no murmur, click, rub or gallop  Abdomen: soft, non-tender; bowel sounds normal; no masses, no organomegaly  Musculoskeletal: ROM normal in all joints, no crepitus, no deformity, Normal muscle strengthen. Back  is symmetric, no curvature. Skin: Skin color, texture, turgor normal. No rashes or lesions  Lymph nodes: Cervical, supraclavicular, and axillary nodes normal.  Neurologic: CN 2 -12 Normal, Normal symmetric reflexes. Normal coordination and gait  Psych: Alert & Oriented x 3, Mood appear stable.    Assessment:    Annual wellness medicare exam   Plan:    During the course of the visit the patient was educated and counseled about appropriate screening and preventive services including:  Annual mammogram Prevnar 13 given today  Recommend annual flu vaccine  Has had pneumococcal 23     Patient Instructions (the written plan) was given to the patient.  Medicare Attestation  I have personally reviewed:  The patient's  medical and social history  Their use of alcohol, tobacco or illicit drugs  Their current medications and supplements  The patient's functional ability including ADLs,fall risks, home safety risks, cognitive, and hearing and visual impairment  Diet and physical activities  Evidence for depression or mood disorders  The patient's weight, height, BMI, and visual acuity have been recorded in the chart. I have made referrals, counseling, and provided education to the patient based on review of the above and I have provided the patient with a written personalized care plan for preventive services.

## 2015-01-24 ENCOUNTER — Other Ambulatory Visit: Payer: Self-pay | Admitting: Internal Medicine

## 2015-02-11 ENCOUNTER — Other Ambulatory Visit: Payer: Self-pay | Admitting: Internal Medicine

## 2015-02-11 DIAGNOSIS — M1711 Unilateral primary osteoarthritis, right knee: Secondary | ICD-10-CM | POA: Diagnosis not present

## 2015-02-26 ENCOUNTER — Other Ambulatory Visit: Payer: Self-pay | Admitting: Internal Medicine

## 2015-03-08 DIAGNOSIS — M1711 Unilateral primary osteoarthritis, right knee: Secondary | ICD-10-CM | POA: Diagnosis not present

## 2015-03-15 ENCOUNTER — Other Ambulatory Visit: Payer: Self-pay

## 2015-03-15 MED ORDER — OMEPRAZOLE 20 MG PO CPDR: 20.0000 mg | DELAYED_RELEASE_CAPSULE | Freq: Every day | ORAL | Status: DC

## 2015-03-15 NOTE — Telephone Encounter (Signed)
Per Dr. Renold Genta; refill for 1 year

## 2015-04-23 ENCOUNTER — Other Ambulatory Visit: Payer: Self-pay | Admitting: Internal Medicine

## 2015-05-07 ENCOUNTER — Ambulatory Visit (INDEPENDENT_AMBULATORY_CARE_PROVIDER_SITE_OTHER): Payer: Medicare Other | Admitting: Internal Medicine

## 2015-05-07 ENCOUNTER — Ambulatory Visit
Admission: RE | Admit: 2015-05-07 | Discharge: 2015-05-07 | Disposition: A | Discharge status: Home / Self Care | Payer: Medicare Other | Source: Ambulatory Visit | Attending: Internal Medicine | Admitting: Internal Medicine

## 2015-05-07 ENCOUNTER — Other Ambulatory Visit: Payer: Self-pay

## 2015-05-07 ENCOUNTER — Encounter: Payer: Self-pay | Admitting: Internal Medicine

## 2015-05-07 VITALS — BP 138/80 | HR 90 | Temp 98.4°F | Resp 20 | Ht 61.0 in | Wt 159.5 lb

## 2015-05-07 DIAGNOSIS — M545 Low back pain: Secondary | ICD-10-CM

## 2015-05-07 DIAGNOSIS — R062 Wheezing: Secondary | ICD-10-CM

## 2015-05-07 DIAGNOSIS — R252 Cramp and spasm: Secondary | ICD-10-CM | POA: Diagnosis not present

## 2015-05-07 DIAGNOSIS — J441 Chronic obstructive pulmonary disease with (acute) exacerbation: Secondary | ICD-10-CM

## 2015-05-07 DIAGNOSIS — J209 Acute bronchitis, unspecified: Secondary | ICD-10-CM

## 2015-05-07 DIAGNOSIS — R42 Dizziness and giddiness: Secondary | ICD-10-CM

## 2015-05-07 DIAGNOSIS — J9801 Acute bronchospasm: Secondary | ICD-10-CM

## 2015-05-07 DIAGNOSIS — R05 Cough: Secondary | ICD-10-CM | POA: Diagnosis not present

## 2015-05-07 DIAGNOSIS — M47816 Spondylosis without myelopathy or radiculopathy, lumbar region: Secondary | ICD-10-CM | POA: Diagnosis not present

## 2015-05-07 DIAGNOSIS — M542 Cervicalgia: Secondary | ICD-10-CM | POA: Diagnosis not present

## 2015-05-07 DIAGNOSIS — M47812 Spondylosis without myelopathy or radiculopathy, cervical region: Secondary | ICD-10-CM | POA: Diagnosis not present

## 2015-05-07 DIAGNOSIS — G5600 Carpal tunnel syndrome, unspecified upper limb: Secondary | ICD-10-CM | POA: Diagnosis not present

## 2015-05-07 LAB — CBC WITH DIFFERENTIAL/PLATELET
BASOS PCT: 0 %
Basophils Absolute: 0 cells/uL (ref 0–200)
EOS PCT: 5 %
Eosinophils Absolute: 265 cells/uL (ref 15–500)
HEMATOCRIT: 41.7 % (ref 35.0–45.0)
HEMOGLOBIN: 13.8 g/dL (ref 11.7–15.5)
LYMPHS ABS: 1696 {cells}/uL (ref 850–3900)
LYMPHS PCT: 32 %
MCH: 26.5 pg — ABNORMAL LOW (ref 27.0–33.0)
MCHC: 33.1 g/dL (ref 32.0–36.0)
MCV: 80 fL (ref 80.0–100.0)
MONO ABS: 424 {cells}/uL (ref 200–950)
MPV: 10.4 fL (ref 7.5–12.5)
Monocytes Relative: 8 %
Neutro Abs: 2915 cells/uL (ref 1500–7800)
Neutrophils Relative %: 55 %
Platelets: 202 10*3/uL (ref 140–400)
RBC: 5.21 MIL/uL — AB (ref 3.80–5.10)
RDW: 15 % (ref 11.0–15.0)
WBC: 5.3 10*3/uL (ref 3.8–10.8)

## 2015-05-07 LAB — COMPLETE METABOLIC PANEL WITH GFR
ALBUMIN: 4.3 g/dL (ref 3.6–5.1)
ALT: 13 U/L (ref 6–29)
AST: 20 U/L (ref 10–35)
Alkaline Phosphatase: 75 U/L (ref 33–130)
BUN: 19 mg/dL (ref 7–25)
CALCIUM: 9.3 mg/dL (ref 8.6–10.4)
CHLORIDE: 99 mmol/L (ref 98–110)
CO2: 27 mmol/L (ref 20–31)
CREATININE: 1.3 mg/dL — AB (ref 0.60–0.93)
GFR, Est African American: 45 mL/min — ABNORMAL LOW (ref >=60)
GFR, Est Non African American: 39 mL/min — ABNORMAL LOW (ref >=60)
GLUCOSE: 95 mg/dL (ref 65–99)
POTASSIUM: 4.1 mmol/L (ref 3.5–5.3)
SODIUM: 140 mmol/L (ref 135–146)
Total Bilirubin: 0.6 mg/dL (ref 0.2–1.2)
Total Protein: 6.7 g/dL (ref 6.1–8.1)

## 2015-05-07 LAB — IRON AND TIBC
%SAT: 16 % (ref 11–50)
IRON: 55 ug/dL (ref 45–160)
TIBC: 339 ug/dL (ref 250–450)
UIBC: 284 ug/dL (ref 125–400)

## 2015-05-07 LAB — MAGNESIUM: MAGNESIUM: 2.1 mg/dL (ref 1.5–2.5)

## 2015-05-07 LAB — TSH: TSH: 0.44 mIU/L

## 2015-05-07 MED ORDER — ALBUTEROL SULFATE (2.5 MG/3ML) 0.083% IN NEBU: 2.5000 mg | INHALATION_SOLUTION | Freq: Four times a day (QID) | RESPIRATORY_TRACT | Status: DC | PRN

## 2015-05-07 MED ORDER — HYDROCODONE-HOMATROPINE 5-1.5 MG/5ML PO SYRP: 5.0000 mL | ORAL_SOLUTION | Freq: Three times a day (TID) | ORAL | Status: DC | PRN

## 2015-05-07 MED ORDER — LEVOFLOXACIN 500 MG PO TABS: 500.0000 mg | ORAL_TABLET | Freq: Every day | ORAL | Status: DC

## 2015-05-07 MED ORDER — CEFTRIAXONE SODIUM 1 G IJ SOLR
1.0000 g | INTRAMUSCULAR | Status: AC
  Administered 2015-05-07: 1 g via INTRAMUSCULAR

## 2015-05-07 MED ORDER — ALBUTEROL SULFATE (2.5 MG/3ML) 0.083% IN NEBU
2.5000 mg | INHALATION_SOLUTION | Freq: Once | RESPIRATORY_TRACT | Status: AC
  Administered 2015-05-07: 2.5 mg via RESPIRATORY_TRACT

## 2015-05-07 MED ORDER — PREDNISONE 10 MG PO TABS: ORAL_TABLET | ORAL | Status: DC

## 2015-05-07 NOTE — Progress Notes (Signed)
   Subjective:    Patient ID: Terri Bridges, female    DOB: 1934/09/18, 81 y.o.   MRN: [id number]  HPI Many complaints today. Crying in office. Complaining of positional dizziness, neck pain, bilateral hand numbness, thoracic and lumbar back pain. Has  had respiratory infection. Says neck pain is severe on right side and grabbing. Came down with URI almost one week ago. Complaint of leg cramps. yellow sputum production. No fever but has had chills. Is to have right knee replacment end of May by Dr. French Ana. Has had left knee replacement previously. Coughing a lot in the office.  She has a history of hypothyroidism, COPD, hypertension, recurrent bouts of bronchitis    Review of Systems     Objective:   Physical Exam Skin pale warm and dry. Nodes none. TMs and pharynx are clear. Neck is supple without JVD thyromegaly or carotid bruits. Chest bilateral rhonchi and wheezing without rales. Phalen's sign is positive bilaterally. No adenopathy. Cardiac exam regular rate and rhythm normal S1 and S2. Extremities without edema. PERRLA- no nystagmus. No facial weakness. Cranial nerves II through XII are grossly intact. Gait is normal.       Assessment & Plan:  Acute bronchitis-has bronchospasm. Given albuterol nebulizer treatment in office. Refilled albuterol nebulizer solution for home use. Sample of Symbicort 161 spray by mouth every 12 hours in addition to either albuterol nebulizer or inhaler. Levaquin 500 milligrams daily for 10 days. Six-day tapering course of prednisone.  COPD  Hypothyroidism-check TSH  Bilateral hand numbness-positive Phalen sign consistent with bilateral carpal tunnel syndrome  Neck pain-right neck probable spasm-C-spine x-ray ordered  Low back pain-x-rays of LS-spine  Kyphosis  Essential hypertension  Vertigo-likely related to respiratory infection  Anxiety depression  History of elevated serum creatinine consistent with chronic kidney disease  Nocturnal  leg cramps-check magnesium and iron level  Osteoarthritis right knee-scheduled for right knee replacement by Dr. French Ana late May  Plan: Rocephin 1 g IM. Levaquin 500 milligrams daily for 10 days. Sterapred DS 10 mg 6 day dosepak. Appointment with hand surgeon regarding bilateral carpal tunnel syndrome  X-rays of C-spine and LS-spine. Chest x-ray. CBC TSH C met. Sample of Symbicort 160 1 spray by mouth every 12 hours. Either use albuterol nebulizer or inhaler 4 times daily. Consider physical therapy for back and neck pain. Hycodan 1 teaspoon by mouth every 8 hours when necessary cough.

## 2015-05-07 NOTE — Patient Instructions (Addendum)
Have CXR. See Dr. Fredna Dow for bilateral hand numbness. Have CXR, neck Xray and LS spine film. Take antibiotics, cough syrup, and prednisone. Use albuterol nebulizer solution or inhaler 4 times daily. Symbicort 161 spray by mouth every 12 hours sample provided. RTC in 2 weeks. Consider physical therapy for back and neck pain.

## 2015-05-08 ENCOUNTER — Other Ambulatory Visit: Payer: Self-pay | Admitting: Internal Medicine

## 2015-05-11 DIAGNOSIS — M1711 Unilateral primary osteoarthritis, right knee: Secondary | ICD-10-CM | POA: Diagnosis not present

## 2015-05-12 DIAGNOSIS — M180 Bilateral primary osteoarthritis of first carpometacarpal joints: Secondary | ICD-10-CM | POA: Insufficient documentation

## 2015-05-12 DIAGNOSIS — G5600 Carpal tunnel syndrome, unspecified upper limb: Secondary | ICD-10-CM | POA: Insufficient documentation

## 2015-05-12 DIAGNOSIS — M19041 Primary osteoarthritis, right hand: Secondary | ICD-10-CM | POA: Diagnosis not present

## 2015-05-12 DIAGNOSIS — M19049 Primary osteoarthritis, unspecified hand: Secondary | ICD-10-CM | POA: Insufficient documentation

## 2015-05-12 DIAGNOSIS — G5603 Carpal tunnel syndrome, bilateral upper limbs: Secondary | ICD-10-CM | POA: Diagnosis not present

## 2015-05-13 ENCOUNTER — Ambulatory Visit: Payer: Self-pay | Admitting: Physician Assistant

## 2015-05-13 NOTE — H&P (Signed)
TOTAL KNEE ADMISSION H&P  Patient is being admitted for right total knee arthroplasty.  Subjective:  Chief Complaint:right knee pain.  HPI: Terri Bridges, 81 y.o. female, has a history of pain and functional disability in the right knee due to arthritis and has failed non-surgical conservative treatments for greater than 12 weeks to includeNSAID's and/or analgesics, corticosteriod injections and activity modification.  Onset of symptoms was gradual, starting 8 years ago with gradually worsening course since that time. The patient noted no past surgery on the right knee(s).  Patient currently rates pain in the right knee(s) at 8 out of 10 with activity. Patient has night pain, worsening of pain with activity and weight bearing, pain that interferes with activities of daily living and joint swelling.  Patient has evidence of periarticular osteophytes and joint space narrowing by imaging studies.There is no active infection.  Patient Active Problem List   Diagnosis Date Noted  . Chronic kidney disease 05/22/2014  . History of knee surgery 10/21/2013  . Left knee pain 09/08/2013  . Preop cardiovascular exam 08/27/2013  . Syncope 08/27/2013  . H/O total knee replacement 08/11/2013  . S/P total knee arthroplasty 04/21/2013  . Gonalgia 04/11/2013  . Osteoporosis, unspecified 03/24/2012  . At high risk for falls 02/12/2012  . Chronic headache 02/12/2012  . Osteoarthritis of both knees 02/12/2012  . Leg pain, bilateral 02/12/2012  . Ruptured tympanic membrane 03/06/2011  . Benign positional vertigo 03/06/2011  . Anxiety 03/06/2011  . COPD (chronic obstructive pulmonary disease) ([place]) 01/08/2011  . Hypertension 01/05/2011  . Hypothyroidism 01/05/2011  . Recurrent sinusitis 01/05/2011  . Allergic rhinitis 01/05/2011  . Depression 01/05/2011  . GE reflux 01/05/2011   Past Medical History  Diagnosis Date  . Allergy     uses Flonase daily as needed  . Hypothyroidism     takes Synthroid  daily  . Hypertension     takes Losartan and Cardizem daily  . Depression     takes Zoloft daily  . GERD (gastroesophageal reflux disease)     takes Omeprazole daily and Protonix daily as needed  . Anxiety     takes Xanax daily as needed  . Asthma     Albuterol as needed  . History of bronchitis     last time many yrs ago  . Ringing in ears     sees Dr.Byers for this  . Joint pain   . Joint swelling   . Osteoporosis   . Constipation     takes an OTC stool softener  . Urinary frequency   . Urinary urgency   . Urinary incontinence   . Nocturia   . Dry eyes     uses eye drops  . Exertional shortness of breath   . Arthritis     "back" (04/21/2013)  . Scoliosis   . Chronic lower back pain   . Pressure in chest 08/23/13    fell and started having chest pressure, will have ECHO and stress test 09/04/13 before surgery  . Heart murmur     years ago  . H/O hiatal hernia   . Headache(784.0)     sinus    Past Surgical History  Procedure Laterality Date  . Nasal sinus surgery      x 4  . Knee arthroscopy Right   . Shoulder arthroscopy w/ rotator cuff repair Right   . Cataract extraction w/ intraocular lens  implant, bilateral Bilateral   . Tubal ligation    . Colonoscopy    .  Esophagogastroduodenoscopy    . Total knee arthroplasty Left 04/21/2013  . Tonsillectomy  1940's  . Total knee arthroplasty Left 04/21/2013    Procedure: TOTAL KNEE ARTHROPLASTY;  Surgeon: Vickey Huger, MD;  Location: [place] [facility];  Service: Orthopedics;  Laterality: Left;  Marland Kitchen Eye surgery    . Joint replacement    . Cardiac catheterization  2005  . Excisional total knee arthroplasty Left 09/08/2013    Procedure: EXCISIONAL TOTAL KNEE ARTHROPLASTY POLYEXCHANGE;  Surgeon: Vickey Huger, MD;  Location: [facility];  Service: Orthopedics;  Laterality: Left;     (Not in a hospital admission) No Known Allergies  Social History  Substance Use Topics  . Smoking status: Former Smoker -- 1.00 packs/day for 10 years    Types:  Cigarettes    Quit date: 03/30/1991  . Smokeless tobacco: Never Used     Comment: quit smoking in Mar 1993  . Alcohol Use: No    Family History  Problem Relation Age of Onset  . Heart failure Mother   . Heart failure Father      Review of Systems  HENT: Positive for tinnitus.   Respiratory: Positive for hemoptysis, sputum production and shortness of breath.   Genitourinary: Positive for frequency.  Musculoskeletal: Positive for joint pain.  Neurological: Positive for dizziness.  All other systems reviewed and are negative.   Objective:  Physical Exam  Constitutional: She is oriented to person, place, and time. She appears well-developed and well-nourished. No distress.  HENT:  Head: Normocephalic and atraumatic.  Nose: Nose normal.  Eyes: Conjunctivae and EOM are normal. Pupils are equal, round, and reactive to light.  Neck: Normal range of motion. Neck supple.  Cardiovascular: Normal rate, regular rhythm, normal heart sounds and intact distal pulses.   Respiratory: Effort normal. No respiratory distress. She has wheezes in the right lower field and the left lower field.  GI: Soft. Bowel sounds are normal. She exhibits no distension. There is no tenderness.  Musculoskeletal:       Right knee: She exhibits swelling. She exhibits normal range of motion, no effusion, no erythema, no LCL laxity, normal patellar mobility and no MCL laxity. Tenderness found. Medial joint line tenderness noted.  Lymphadenopathy:    She has no cervical adenopathy.  Neurological: She is alert and oriented to person, place, and time. No cranial nerve deficit.  Skin: Skin is warm and dry. No rash noted. No erythema.  Psychiatric: She has a normal mood and affect. Her behavior is normal.    Vital signs in last 24 hours: @VSRANGES @  Labs:   Estimated body mass index is 30.15 kg/(m^2) as calculated from the following:   Height as of 05/07/15: 5\' 1"  (1.549 m).   Weight as of 05/07/15: 72.349 kg (159  lb 8 oz).   Imaging Review Plain radiographs demonstrate moderate degenerative joint disease of the right knee(s). The overall alignment issignificant varus. The bone quality appears to be good for age and reported activity level.  Assessment/Plan:  End stage arthritis, right knee   The patient history, physical examination, clinical judgment of the provider and imaging studies are consistent with end stage degenerative joint disease of the right knee(s) and total knee arthroplasty is deemed medically necessary. The treatment options including medical management, injection therapy arthroscopy and arthroplasty were discussed at length. The risks and benefits of total knee arthroplasty were presented and reviewed. The risks due to aseptic loosening, infection, stiffness, patella tracking problems, thromboembolic complications and other imponderables were discussed. The patient acknowledged  the explanation, agreed to proceed with the plan and consent was signed. Patient is being admitted for inpatient treatment for surgery, pain control, PT, OT, prophylactic antibiotics, VTE prophylaxis, progressive ambulation and ADL's and discharge planning. The patient is planning to be discharged to skilled nursing facility [facility].

## 2015-05-20 ENCOUNTER — Ambulatory Visit (INDEPENDENT_AMBULATORY_CARE_PROVIDER_SITE_OTHER): Payer: Medicare Other | Admitting: Internal Medicine

## 2015-05-20 ENCOUNTER — Encounter: Payer: Self-pay | Admitting: Internal Medicine

## 2015-05-20 VITALS — BP 130/76 | HR 88 | Temp 98.6°F | Resp 18 | Wt 161.0 lb

## 2015-05-20 DIAGNOSIS — J209 Acute bronchitis, unspecified: Secondary | ICD-10-CM

## 2015-05-20 DIAGNOSIS — K449 Diaphragmatic hernia without obstruction or gangrene: Secondary | ICD-10-CM | POA: Diagnosis not present

## 2015-05-20 DIAGNOSIS — R05 Cough: Secondary | ICD-10-CM | POA: Diagnosis not present

## 2015-05-20 DIAGNOSIS — R059 Cough, unspecified: Secondary | ICD-10-CM

## 2015-05-20 DIAGNOSIS — E039 Hypothyroidism, unspecified: Secondary | ICD-10-CM

## 2015-05-20 DIAGNOSIS — J441 Chronic obstructive pulmonary disease with (acute) exacerbation: Secondary | ICD-10-CM

## 2015-05-20 DIAGNOSIS — J329 Chronic sinusitis, unspecified: Secondary | ICD-10-CM | POA: Diagnosis not present

## 2015-05-20 DIAGNOSIS — F419 Anxiety disorder, unspecified: Secondary | ICD-10-CM

## 2015-05-20 DIAGNOSIS — I10 Essential (primary) hypertension: Secondary | ICD-10-CM | POA: Diagnosis not present

## 2015-05-20 DIAGNOSIS — F418 Other specified anxiety disorders: Secondary | ICD-10-CM | POA: Diagnosis not present

## 2015-05-20 DIAGNOSIS — F329 Major depressive disorder, single episode, unspecified: Secondary | ICD-10-CM

## 2015-05-20 DIAGNOSIS — K219 Gastro-esophageal reflux disease without esophagitis: Secondary | ICD-10-CM | POA: Diagnosis not present

## 2015-05-20 MED ORDER — PREDNISONE 10 MG PO TABS: ORAL_TABLET | ORAL | Status: DC

## 2015-05-20 MED ORDER — DOXYCYCLINE HYCLATE 100 MG PO TABS: 100.0000 mg | ORAL_TABLET | Freq: Two times a day (BID) | ORAL | Status: DC

## 2015-05-20 NOTE — Pre-Procedure Instructions (Signed)
    Terri Bridges  05/20/2015      [id number] [place], [place] [id number] [place] [id number] Phone: [phone] Fax: [phone]  [id number] [place], [place] - [address] [facility] [place] [address] [place] [address] Phone: [phone] Fax: [phone]  [facility] [address], [place] - [id number] [place] [place] [address] Phone: [phone] Fax: [phone]    Your procedure is scheduled on 06/04/15.  Report to [facility]mitting at 530 A.M.  Call this number if you have problems the morning of surgery:  [phone]   Remember:  Do not eat food or drink liquids after midnight.  Take these medicines the morning of surgery with A SIP OF WATER --all inhalers,norvasc,hydrocodone,synthroid,prilosec,zoloft   Do not wear jewelry, make-up or nail polish.  Do not wear lotions, powders, or perfumes.  You may wear deodorant.  Do not shave 48 hours prior to surgery.  Men may shave face and neck.  Do not bring valuables to the hospital.  [facility] is not responsible for any belongings or valuables.  Contacts, dentures or bridgework may not be worn into surgery.  Leave your suitcase in the car.  After surgery it may be brought to your room.  For patients admitted to the hospital, discharge time will be determined by your treatment team.  Patients discharged the day of surgery will not be allowed to drive home.   Name and phone number of your driver:  Special instructions:    Please read over the following fact sheets that you were given. Pain Booklet, Coughing and Deep Breathing, Blood Transfusion Information, MRSA Information and Surgical Site Infection Prevention

## 2015-05-21 ENCOUNTER — Ambulatory Visit: Payer: Medicare Other | Admitting: Internal Medicine

## 2015-05-21 ENCOUNTER — Inpatient Hospital Stay (HOSPITAL_COMMUNITY)
Admission: RE | Admit: 2015-05-21 | Discharge: 2015-05-21 | Disposition: A | Discharge status: Home / Self Care | Payer: Medicare Other | Source: Ambulatory Visit

## 2015-05-21 NOTE — Progress Notes (Signed)
   Subjective:    Patient ID: Terri Bridges, female    DOB: [birth year], 81 y.o.   MRN: [id number]  HPI Patient with long-standing history recurrent sinusitis and bronchitis as well as COPD present state for follow-up. At last visit she was placed on a six-day Sterapred DS 10 mg tapering course and placed on Levaquin 500 milligrams daily for 10 days. Apparently she was not eating a lot of food and felt nauseated on Levaquin says she began taking only half of a 500 mg tablet once daily. She did not call and tell us this. Says she is better but she still has a deep congested cough and white  to yellow sputum production. She's been using albuterol nebulizer at home several times daily. At last visit I gave her a Symbicort inhaler sample. She has some issues affording her medications. I gave her another Symbicort sample today.  She is scheduled for knee replacement surgery May 26. However I'm concerned that she's not 100% at this point in time and may not be by then.   Her affect seems better than it was 2 weeks ago. She's not crying today. Her chest x-ray done 05/07/2015 showed no infiltrate the large hiatal hernia.    Review of Systems see above. No fever or chills.     Objective:   Physical Exam Skin warm and dry. Nodes none. TMs are clear. Pharynx is clear. Neck is supple. Chest clear to auscultation without rales or wheezing but has protracted cough that is very deep.       Assessment & Plan:  Persistent bronchitis  History of recurrent chronic sinusitis  COPD  Hypothyroidism-recent TSH within normal limits  Hypertension  Nausea related to Levaquin  Elevated serum creatinine at last visit 1.30. Suspect she was volume depleted at that time. Follow-up at next visit.  Plan: Change to doxycycline 100 mg twice daily for 10 days. Sterapred DS 10 mg 6 day dosepak repeated. Symbicort inhaler 1 spray by mouth every 12 hours. Continue albuterol inhaler or nebulizer 4 times daily.  Follow-up in 2 weeks. Suggested she hold off on May 26 surgery-prefer that she defer this for 4 weeks until June 26 pending reevaluation May 25.

## 2015-05-21 NOTE — Patient Instructions (Signed)
Doxycycline 100 mg twice daily for 10 days. Continue albuterol nebulizer or inhaler 4 times daily. Symbicort 1 spray by mouth every 12 hours. Repeat course of prednisone. Return in 2 weeks.

## 2015-06-03 ENCOUNTER — Encounter: Payer: Self-pay | Admitting: Internal Medicine

## 2015-06-03 ENCOUNTER — Ambulatory Visit (INDEPENDENT_AMBULATORY_CARE_PROVIDER_SITE_OTHER): Payer: Medicare Other | Admitting: Internal Medicine

## 2015-06-03 VITALS — BP 134/72 | HR 94 | Temp 98.8°F | Resp 18 | Wt 161.0 lb

## 2015-06-03 DIAGNOSIS — R208 Other disturbances of skin sensation: Secondary | ICD-10-CM | POA: Diagnosis not present

## 2015-06-03 DIAGNOSIS — R0602 Shortness of breath: Secondary | ICD-10-CM

## 2015-06-03 DIAGNOSIS — J449 Chronic obstructive pulmonary disease, unspecified: Secondary | ICD-10-CM | POA: Diagnosis not present

## 2015-06-03 DIAGNOSIS — R200 Anesthesia of skin: Secondary | ICD-10-CM

## 2015-06-03 NOTE — Patient Instructions (Signed)
Pulmonary consultation to be obtained. BNP drawn and pending.

## 2015-06-03 NOTE — Progress Notes (Signed)
   Subjective:    Patient ID: Terri Bridges, female    DOB: 10-13-1934, 81 y.o.   MRN: [id number]  HPI In today to follow-up on acute bronchitis and exacerbation of COPD. Still having coughing congestion. Has completed course of antibiotics. Feels better. Knee surgery has been postponed. I'm concerned that she's not 100% better. Feels like there is still sputum production. Says she is using Symbicort inhaler and albuterol nebulizer treatment. Has easy fatigability.  Recently saw hand surgeon. She says other testing has been ordered. Reviewed with her x-rays it we did here recently.  Complains of some vague chest tightness with no radiation.  Review of Systems see above     Objective:   Physical Exam  Pulse oximetry is 93% on room air. She sounds a bit hoarse and congested. Has congested cough. TMs are clear. Pharynx is clear. Neck is supple without adenopathy. Chest clear to auscultation.      Assessment & Plan:  Persistent cough and congestion. No wheezing.  Persistent symptoms consistent with severe COPD exacerbation  Bilateral hand numbness-currently undergoing workup by hand surgeon  Plan: Patient will have pulmonary consult for further evaluation. Knee surgeries been canceled until respiratory situation can improved. BNP drawn. Have not renewed prednisone at this point in time. Recommend pulmonary consultation.

## 2015-06-04 ENCOUNTER — Inpatient Hospital Stay (HOSPITAL_COMMUNITY): Admission: RE | Admit: 2015-06-04 | Payer: Medicare Other | Source: Ambulatory Visit | Admitting: Orthopedic Surgery

## 2015-06-04 ENCOUNTER — Encounter (HOSPITAL_COMMUNITY): Admission: RE | Payer: Self-pay | Source: Ambulatory Visit

## 2015-06-04 SURGERY — ARTHROPLASTY, KNEE, TOTAL
Anesthesia: General | Laterality: Right

## 2015-06-05 LAB — BRAIN NATRIURETIC PEPTIDE: BRAIN NATRIURETIC PEPTIDE: 32 pg/mL (ref <100)

## 2015-06-12 ENCOUNTER — Other Ambulatory Visit: Payer: Self-pay | Admitting: Internal Medicine

## 2015-06-14 ENCOUNTER — Other Ambulatory Visit: Payer: Medicare Other

## 2015-06-14 ENCOUNTER — Ambulatory Visit (INDEPENDENT_AMBULATORY_CARE_PROVIDER_SITE_OTHER): Payer: Medicare Other | Admitting: Pulmonary Disease

## 2015-06-14 ENCOUNTER — Encounter: Payer: Self-pay | Admitting: Pulmonary Disease

## 2015-06-14 ENCOUNTER — Telehealth: Payer: Self-pay | Admitting: Pulmonary Disease

## 2015-06-14 VITALS — BP 134/76 | HR 65 | Ht 63.0 in | Wt 164.2 lb

## 2015-06-14 DIAGNOSIS — R05 Cough: Secondary | ICD-10-CM | POA: Insufficient documentation

## 2015-06-14 DIAGNOSIS — R0600 Dyspnea, unspecified: Secondary | ICD-10-CM

## 2015-06-14 DIAGNOSIS — K219 Gastro-esophageal reflux disease without esophagitis: Secondary | ICD-10-CM | POA: Diagnosis not present

## 2015-06-14 DIAGNOSIS — R059 Cough, unspecified: Secondary | ICD-10-CM

## 2015-06-14 DIAGNOSIS — J309 Allergic rhinitis, unspecified: Secondary | ICD-10-CM

## 2015-06-14 DIAGNOSIS — K449 Diaphragmatic hernia without obstruction or gangrene: Secondary | ICD-10-CM

## 2015-06-14 MED ORDER — RANITIDINE HCL 150 MG PO TABS: 150.0000 mg | ORAL_TABLET | Freq: Every day | ORAL | Status: DC

## 2015-06-14 NOTE — Telephone Encounter (Signed)
IMAGING CXR PA/LAT 05/07/15 (personally reviewed by me):  CARDIAC TTE  LABS 06/03/15 BNP:  32.0  05/07/15 CBC: 5.3/13.8/41.7/202 BMP: 140/4.1/99/27/19/1.3/95/9.3 LFT: 4.3/6.7/0.6/75/20/13

## 2015-06-14 NOTE — Progress Notes (Signed)
Subjective:    Patient ID: Terri Bridges, female    DOB: 1934-02-06, 81 y.o.   MRN: [id number]  HPI She reports she was scheduled for a knee replacement in May but developed an intermittent cough. Cough is more severe in the morning and productive of a "white, yellow" mucus. Denies any hemoptysis. She does wake up at night coughing. She uses her nebulizer at night which helps her expectorate but isn't sure this helps to stop her cough. She does have intermittent wheezing. She reports increased dyspnea on exertion beginning about the same time as the cough. She was treated with 2 courses of antibiotics & Prednisone without any lasting relief but there was a slight improvement. She has also been taking prescription cough syrup which does seem to help some. She was started on Symbicort in May and reports it has seemed to help. She reports she does cough significantly less when she sleeps sitting up/at an incline. She has a known hiatal hernia. She does try to avoid spicy foods and eats small meals. She takes Prilosec daily. She does report morning brash water taste and reflux into the back of her mouth at times. She has had 4 sinus surgeries. She reports she still has some sinus congestion & post-nasal drainage. Currently not taking any medication for her allergies.   Review of Systems She reports a "pressure" in her chest that occurs with coughing. No frank chest pain. No fever, chills, or sweats. A pertinent 14 point review of systems is negative except as per the history of presenting illness.  No Known Allergies  Current Outpatient Prescriptions on File Prior to Visit  Medication Sig Dispense Refill  . albuterol (PROVENTIL) (2.5 MG/3ML) 0.083% nebulizer solution Take 3 mLs (2.5 mg total) by nebulization every 6 (six) hours as needed for wheezing or shortness of breath. 150 mL 1  . amLODipine (NORVASC) 5 MG tablet TAKE ONE TABLET BY MOUTH ONCE DAILY 30 tablet 0  . Calcium-Vitamin D (CALTRATE 600  PLUS-VIT D PO) Take 1 tablet by mouth daily.    . furosemide (LASIX) 20 MG tablet TAKE ONE TABLET BY MOUTH ONCE DAILY 30 tablet 11  . HYDROcodone-homatropine (HYCODAN) 5-1.5 MG/5ML syrup Take 5 mLs by mouth every 8 (eight) hours as needed for cough. 120 mL 0  . levothyroxine (SYNTHROID, LEVOTHROID) 125 MCG tablet TAKE ONE TABLET BY MOUTH ONCE DAILY 90 tablet 3  . losartan (COZAAR) 100 MG tablet TAKE ONE TABLET BY MOUTH ONCE DAILY 90 tablet 0  . Omega-3 Fatty Acids (FISH OIL PO) Take 1 capsule by mouth daily.    Marland Kitchen omeprazole (PRILOSEC) 20 MG capsule Take 1 capsule (20 mg total) by mouth daily. 90 capsule 3  . albuterol (PROVENTIL HFA;VENTOLIN HFA) 108 (90 BASE) MCG/ACT inhaler Inhale 2 puffs into the lungs every 6 (six) hours as needed for wheezing or shortness of breath. (Patient not taking: Reported on 06/14/2015) 1 Inhaler 3  . sertraline (ZOLOFT) 100 MG tablet TAKE ONE TABLET BY MOUTH ONCE DAILY (Patient not taking: Reported on 06/14/2015) 30 tablet 0   No current facility-administered medications on file prior to visit.    Past Medical History  Diagnosis Date  . Allergy     uses Flonase daily as needed  . Hypothyroidism     takes Synthroid daily  . Hypertension     takes Losartan and Cardizem daily  . Depression     takes Zoloft daily  . GERD (gastroesophageal reflux disease)     takes  Omeprazole daily and Protonix daily as needed  . Anxiety     takes Xanax daily as needed  . History of bronchitis     last time many yrs ago  . Ringing in ears     sees Dr.Byers for this  . Joint pain   . Joint swelling   . Osteoporosis   . Constipation     takes an OTC stool softener  . Urinary frequency   . Urinary urgency   . Urinary incontinence   . Nocturia   . Dry eyes     uses eye drops  . Exertional shortness of breath   . Arthritis     "back" (04/21/2013)  . Scoliosis   . Chronic lower back pain   . Pressure in chest 08/23/13    fell and started having chest pressure, will have  ECHO and stress test 09/04/13 before surgery  . Heart murmur     years ago  . H/O hiatal hernia   . Headache(784.0)     sinus  . Allergic rhinitis     Past Surgical History  Procedure Laterality Date  . Nasal sinus surgery      x 4  . Knee arthroscopy Right   . Shoulder arthroscopy w/ rotator cuff repair Right   . Cataract extraction w/ intraocular lens  implant, bilateral Bilateral   . Tubal ligation    . Colonoscopy    . Esophagogastroduodenoscopy    . Total knee arthroplasty Left 04/21/2013  . Tonsillectomy  1940's  . Total knee arthroplasty Left 04/21/2013    Procedure: TOTAL KNEE ARTHROPLASTY;  Surgeon: Vickey Huger, MD;  Location: [facility];  Service: Orthopedics;  Laterality: Left;  Marland Kitchen Eye surgery    . Joint replacement    . Cardiac catheterization  2005  . Excisional total knee arthroplasty Left 09/08/2013    Procedure: EXCISIONAL TOTAL KNEE ARTHROPLASTY POLYEXCHANGE;  Surgeon: Vickey Huger, MD;  Location: [facility];  Service: Orthopedics;  Laterality: Left;    Family History  Problem Relation Age of Onset  . Heart failure Mother   . Heart failure Father   . Hypertension Sister     Social History   Social History  . Marital Status: Single    Spouse Name: N/A  . Number of Children: N/A  . Years of Education: N/A   Occupational History  . Retired     Orthoptist, Building control surveyor   Social History Main Topics  . Smoking status: Former Smoker -- 1.00 packs/day for 10 years    Types: Cigarettes    Quit date: 03/30/1991  . Smokeless tobacco: Never Used     Comment: quit smoking in Mar 1993  . Alcohol Use: No  . Drug Use: No  . Sexual Activity: No   Other Topics Concern  . None   Social History Narrative   Originally from [place]. Previously worked in Engineer, mining for [employer] Has 2 dogs currently. Remote exposure to parrots in her current home. No known mold exposure. Remote travel to [place]. Has 1 indoor plant.       Objective:   Physical Exam BP 134/76 mmHg  Pulse 65  Ht 5\' 3"   (1.6 m)  Wt 164 lb 3.2 oz (74.481 kg)  BMI 29.09 kg/m2  SpO2 95% General:  Awake. Alert. No acute distress.   Integument:  Warm & dry. No rash on exposed skin. No bruising. Lymphatics:  No appreciated cervical or supraclavicular lymphadenoapthy. HEENT:  Moist mucus membranes. No oral ulcers.  No scleral injection or icterus. No nasal turbinate swelling. Cardiovascular:  Regular rate. No edema. No appreciable JVD.  Pulmonary:  Good aeration & clear to auscultation bilaterally. Symmetric chest wall expansion. No accessory muscle use. Intermittent cough witnessed during exam. Abdomen: Soft. Normal bowel sounds. Nondistended. Grossly nontender. Musculoskeletal:  Normal bulk and tone. Hand grip strength 5/5 bilaterally. No joint deformity or effusion appreciated. Neurological:  CN 2-12 grossly in tact. No meningismus. Moving all 4 extremities equally. Symmetric brachioradialis deep tendon reflexes. Psychiatric:  Mood and affect congruent. Speech normal rhythm, rate & tone.   IMAGING CXR PA/LAT 05/07/15 (personally reviewed by me): Kyphosis noted on lateral views. Hiatal hernia noted. No parenchymal nodule or opacity appreciated otherwise. Mild hyperinflation with flattening of the diaphragms. Heart normal in size & mediastinum otherwise normal in contour. No pleural effusion appreciated.  CARDIAC TTE (09/04/13): LV EF 55-60%. LA & RA normal in size. RV normal in size and function. No aortic stenosis or regurgitation. Trivial mitral regurgitation without stenosis. No tricuspid regurgitation. No pericardial effusion.  LABS 06/03/15 BNP: 32.0  05/07/15 CBC: 5.3/13.8/41.7/202 BMP: 140/4.1/99/27/19/1.3/95/9.3 LFT: 4.3/6.7/0.6/75/20/13    Assessment & Plan:  81 year old with ongoing intermittent coughing highly suggestive of uncontrolled reflux with her history of hiatal hernia. Patient also has allergic rhinitis which could be contributing to her symptomatology. I reviewed her chest x-ray which  shows no parenchymal abnormality but certainly there could be some element of bronchiectasis. She has no history of recurrent bronchitis but I feel that evaluating her immune system function is reasonable at this time. She does have a history of tobacco use and given some symptomatic improvement with inhaled medication therapy screening for underlying COPD/asthma is reasonable as well. I also instructed the patient to reinitiate treatment for her allergic rhinitis with over-the-counter medications before prescribing further. I instructed the patient to contact my office if she had any new questions or breathing problems before her next appointment.  1. Cough: Checking sputum culture for AFB, fungus, and bacteria. Increasing gastric acid suppression for treatment of reflux. 2. Dyspnea: Checking for pulmonary function testing in 6 minute walk test on room air at next appointment. 3. GERD with hiatal hernia: Checking barium esophagram. Continuing Prilosec. Starting Zantac 150 mg by mouth daily at bedtime. Patient instructed to notify me if she continues to have reflux. Provided appropriate dietary and lifestyle modifications. 4. Allergic rhinitis: Advised to resume taking antihistamine medication along with twice daily nasal saline rinse. 5. Follow-up: Patient to return to clinic in 4-6 weeks or sooner if needed.  Sonia Baller Ashok Cordia, M.D. [facility] Pager:  [phone] After 3pm or if no response, call [phone] 10:15 AM 06/14/2015

## 2015-06-14 NOTE — Patient Instructions (Signed)
   Avoid eating within 3 hours of bedtime.  Sleep with your head elevated at least 3-5 inches.  Resume using Zyrtec, Claritin, or Allegra daily to help with your allergies. You can also try using a nasal saline rinse twice daily. I recommend NeilMed which you can buy over the counter.   Continue taking your Prilosec daily. We are adding Zantac to be taken at night before bed. Call me if you are still having any reflux.  We will do a breathing and walking test at your next appointment.  I am also checking a culture of your phlegm to make sure you don't have a smoldering infection and blood work to make sure your immune system is working properly.  Call me if you have any questions otherwise I will see you back in 4-6 weeks.  TESTS ORDERED: 1. Serum RAST panel, IgE, & quantitative immunoglobulin panel 2. Esophagram 3. Sputum culture for AFB, fungus, and bacteria 4. Full pulmonary function testing and follow-up appointment 5. 6 minute walk test on room air at next appointment

## 2015-06-15 DIAGNOSIS — G5603 Carpal tunnel syndrome, bilateral upper limbs: Secondary | ICD-10-CM | POA: Diagnosis not present

## 2015-06-15 LAB — RESPIRATORY ALLERGY PROFILE REGION II ~~LOC~~
Allergen, Cedar tree, t12: <0.1 kU/L
Allergen, Comm Silver Birch, t9: <0.1 kU/L
Allergen, Cottonwood, t14: <0.1 kU/L
Allergen, Mouse Urine Protein, e78: <0.1 kU/L
Aspergillus fumigatus, m3: <0.1 kU/L
Bermuda Grass: <0.1 kU/L
Common Ragweed: <0.1 kU/L
Elm IgE: <0.1 kU/L
IgE (Immunoglobulin E), Serum: 10 kU/L (ref <115)
Penicillium Notatum: <0.1 kU/L

## 2015-06-15 LAB — IGG, IGA, IGM
IGG (IMMUNOGLOBIN G), SERUM: 558 mg/dL — AB (ref 694–1618)
IgA: 131 mg/dL (ref 81–463)
IgM, Serum: 59 mg/dL (ref 48–271)

## 2015-06-16 DIAGNOSIS — M5412 Radiculopathy, cervical region: Secondary | ICD-10-CM | POA: Diagnosis not present

## 2015-06-16 DIAGNOSIS — G5601 Carpal tunnel syndrome, right upper limb: Secondary | ICD-10-CM | POA: Diagnosis not present

## 2015-06-16 DIAGNOSIS — M542 Cervicalgia: Secondary | ICD-10-CM | POA: Insufficient documentation

## 2015-06-18 ENCOUNTER — Other Ambulatory Visit: Payer: Medicare Other

## 2015-06-18 ENCOUNTER — Other Ambulatory Visit: Payer: Self-pay | Admitting: *Deleted

## 2015-06-18 ENCOUNTER — Ambulatory Visit (HOSPITAL_COMMUNITY)
Admission: RE | Admit: 2015-06-18 | Discharge: 2015-06-18 | Disposition: A | Discharge status: Home / Self Care | Payer: Medicare Other | Source: Ambulatory Visit | Attending: Pulmonary Disease | Admitting: Pulmonary Disease

## 2015-06-18 ENCOUNTER — Ambulatory Visit (HOSPITAL_COMMUNITY): Admission: RE | Admit: 2015-06-18 | Payer: Medicare Other | Source: Ambulatory Visit

## 2015-06-18 ENCOUNTER — Other Ambulatory Visit: Payer: Self-pay | Admitting: Pulmonary Disease

## 2015-06-18 DIAGNOSIS — R05 Cough: Secondary | ICD-10-CM | POA: Diagnosis not present

## 2015-06-18 DIAGNOSIS — K219 Gastro-esophageal reflux disease without esophagitis: Secondary | ICD-10-CM

## 2015-06-18 DIAGNOSIS — K224 Dyskinesia of esophagus: Secondary | ICD-10-CM | POA: Insufficient documentation

## 2015-06-18 DIAGNOSIS — K449 Diaphragmatic hernia without obstruction or gangrene: Secondary | ICD-10-CM | POA: Diagnosis not present

## 2015-06-18 DIAGNOSIS — K210 Gastro-esophageal reflux disease with esophagitis: Secondary | ICD-10-CM | POA: Diagnosis not present

## 2015-06-18 DIAGNOSIS — J309 Allergic rhinitis, unspecified: Secondary | ICD-10-CM | POA: Diagnosis not present

## 2015-06-18 DIAGNOSIS — R0600 Dyspnea, unspecified: Secondary | ICD-10-CM | POA: Diagnosis not present

## 2015-06-21 LAB — RESPIRATORY CULTURE OR RESPIRATORY AND SPUTUM CULTURE: ORGANISM ID, BACTERIA: NORMAL

## 2015-06-24 ENCOUNTER — Other Ambulatory Visit: Payer: Self-pay | Admitting: Internal Medicine

## 2015-06-25 NOTE — Progress Notes (Signed)
Quick Note:  Spoke with pt and notified of results per Dr. Ashok Cordia. Pt verbalized understanding and denied any questions.  ______

## 2015-07-06 ENCOUNTER — Encounter: Payer: Self-pay | Admitting: Internal Medicine

## 2015-07-06 ENCOUNTER — Ambulatory Visit (INDEPENDENT_AMBULATORY_CARE_PROVIDER_SITE_OTHER): Payer: Medicare Other | Admitting: Internal Medicine

## 2015-07-06 VITALS — BP 122/84 | HR 85 | Temp 97.5°F | Resp 18 | Wt 165.0 lb

## 2015-07-06 DIAGNOSIS — F329 Major depressive disorder, single episode, unspecified: Secondary | ICD-10-CM | POA: Diagnosis not present

## 2015-07-06 DIAGNOSIS — K219 Gastro-esophageal reflux disease without esophagitis: Secondary | ICD-10-CM

## 2015-07-06 DIAGNOSIS — I10 Essential (primary) hypertension: Secondary | ICD-10-CM | POA: Diagnosis not present

## 2015-07-06 DIAGNOSIS — H8110 Benign paroxysmal vertigo, unspecified ear: Secondary | ICD-10-CM | POA: Diagnosis not present

## 2015-07-06 DIAGNOSIS — F411 Generalized anxiety disorder: Secondary | ICD-10-CM | POA: Diagnosis not present

## 2015-07-06 DIAGNOSIS — M542 Cervicalgia: Secondary | ICD-10-CM | POA: Diagnosis not present

## 2015-07-06 DIAGNOSIS — M25561 Pain in right knee: Secondary | ICD-10-CM | POA: Diagnosis not present

## 2015-07-06 DIAGNOSIS — Z8709 Personal history of other diseases of the respiratory system: Secondary | ICD-10-CM | POA: Diagnosis not present

## 2015-07-06 DIAGNOSIS — F32A Depression, unspecified: Secondary | ICD-10-CM

## 2015-07-06 DIAGNOSIS — G5601 Carpal tunnel syndrome, right upper limb: Secondary | ICD-10-CM | POA: Diagnosis not present

## 2015-07-06 DIAGNOSIS — M25562 Pain in left knee: Secondary | ICD-10-CM

## 2015-07-06 MED ORDER — TRAMADOL HCL 50 MG PO TABS: 50.0000 mg | ORAL_TABLET | Freq: Three times a day (TID) | ORAL | Status: DC | PRN

## 2015-07-06 NOTE — Progress Notes (Signed)
   Subjective:    Patient ID: Terri Bridges, female    DOB: [birth year], 81 y.o.   MRN: [id number]  HPI 81 year old Female in today for follow-up on chronic bronchitis and GE reflux. Her knee surgery was canceled in May when she had a severe bout of bronchitis with bronchospasm. She has had pulmonary consultation. She's had some immunoglobulin testing as well. Sputum grew Candida albicans. AFB culture negative. Chest x-ray done by pulmonologist recently showed no active disease. She had barium swallow showing findings consistent with chronic reflux disease including chronic esophagitis, cricopharyngeus muscle dysfunction, moderate to large hiatal hernia with spontaneous reflux. She has essential hypertension, bilateral knee pain and was considering further knee surgery when she had a severe bout of acute bronchitis with bronchospasm in May. She is seen Dr. Fredna Dow regarding bilateral hand numbness. Had wrist injections for arthritis/carpal tunnel syndrome. Says these did not help. Continues to have right neck pain. Was told she might have a cervical neck issue. Says she will not consider neck surgery. She was referred to orthopedist regarding neck pain but may cancel this appointment if she is not getting even consider neck surgery. Says when she turns her head to the right sometimes she feels dizzy-true vertigo symptoms. We are going to refer her to Neurologist for further evaluation.    Review of Systems see above     Objective:   Physical Exam  Skin warm and dry. Nodes none. Chest clear. Cardiac exam regular rate and rhythm. Extremities without edema.      Assessment & Plan:  Chronic bilateral knee pain-we are trying to get her well enough to undergo knee surgery but she's had multiple complaints and issues. She is asking that I refill tramadol. Given #90 tablets with no refill.  GE reflux-severe  Chronic bronchitis-COPD. Patient was prescribed Zantac 150 mg twice daily by pulmonologist. Not  really able to afford PPI medication. Issues with affording inhalers also.  Right neck pain.   Bilateral carpal tunnel syndrome  Vertigo which is episodic and triggered by turning her head to the right  Anxiety depression-treated with Zoloft  Essential hypertension-stable on current regimen  Chronic sinusitis-recurrent but currently stable and seems to be clear  Hypothyroidism-TSH checked April 2017 and was normal  Plan: She will seek neurology consultation regarding vertigo and right neck pain. Hopefully after that we can allow her to proceed with knee surgery. She does have follow-up appointment in some testing with pulmonologist in the near future.  30 minutes spent with patient  Physical exam is due here January 2018.

## 2015-07-06 NOTE — Patient Instructions (Addendum)
To see Neurology about dizziness and neck pain. Followup with Pulmonary. Hand numbness not improved. Tramadol refilled. After neurology evaluation is complete she may reconsider knee surgery. Physical exam due January 2018.

## 2015-07-14 ENCOUNTER — Telehealth: Payer: Self-pay | Admitting: *Deleted

## 2015-07-14 NOTE — Telephone Encounter (Signed)
Pt sputum culture showed acid fast bacili has been isolated. Still in progress for identification. JN on vacation.   Spoke with SN and this can wait for JN return from vacation.

## 2015-07-15 ENCOUNTER — Other Ambulatory Visit: Payer: Self-pay | Admitting: Internal Medicine

## 2015-07-15 NOTE — Telephone Encounter (Signed)
Noted. Will awaiting ID.

## 2015-07-19 ENCOUNTER — Other Ambulatory Visit: Payer: Medicare Other | Admitting: Internal Medicine

## 2015-07-20 ENCOUNTER — Ambulatory Visit: Payer: Medicare Other | Admitting: Internal Medicine

## 2015-07-20 ENCOUNTER — Encounter: Payer: Self-pay | Admitting: Neurology

## 2015-07-20 ENCOUNTER — Ambulatory Visit (INDEPENDENT_AMBULATORY_CARE_PROVIDER_SITE_OTHER): Payer: Medicare Other | Admitting: Neurology

## 2015-07-20 VITALS — BP 135/80 | HR 76 | Ht 63.0 in | Wt 164.0 lb

## 2015-07-20 DIAGNOSIS — H8110 Benign paroxysmal vertigo, unspecified ear: Secondary | ICD-10-CM | POA: Diagnosis not present

## 2015-07-20 DIAGNOSIS — R200 Anesthesia of skin: Secondary | ICD-10-CM

## 2015-07-20 DIAGNOSIS — R208 Other disturbances of skin sensation: Secondary | ICD-10-CM

## 2015-07-20 DIAGNOSIS — M6289 Other specified disorders of muscle: Secondary | ICD-10-CM | POA: Diagnosis not present

## 2015-07-20 DIAGNOSIS — R29898 Other symptoms and signs involving the musculoskeletal system: Secondary | ICD-10-CM | POA: Diagnosis not present

## 2015-07-20 DIAGNOSIS — M5010 Cervical disc disorder with radiculopathy, unspecified cervical region: Secondary | ICD-10-CM | POA: Diagnosis not present

## 2015-07-20 NOTE — Progress Notes (Signed)
[facility]    Provider:  Dr Jaynee Eagles Referring Provider: Elby Showers, MD Primary Care Physician:  Elby Showers, MD  CC:  Neck pain and hands going numb  HPI:  Terri Bridges is a 81 y.o. female here as a referral from Dr. Renold Genta for neck pain and hands going numb. No inciting events or trauma. Past medical history of HTN, hypothyroidism, BPPV, arthritis, anxirty, depression.She has had neck pain for one year and worsening, it "grabs" and she has to hold her neck because it hurts, tender on the right in the back of the right ear, she has point tenderness at the spine (points to c7 approx), muscular and tight, can't turn her head as much anymore. She has numbness in her heands especially if she is sitting holding a book and holding a laptop it starts in the fingers. Started in the right hand and starts hurting in fingers 2-4 and she has to tap them and rub them to wake them up. She does not wake up with numbness in the night or the morning. But at night if she is reading a magazine her hands will go numb. She feels weakness in her hands as well. She is dropping objects, grip strength is poor. Sounds like she had an emg/mcs at the [facility] They told her it They wanted her to [facility] but she came here instead.   Another problem is dizziness. She gets dizzy when she rolls over in bed. When she goes to bed she has roll over slowly and has to lay still and the room spinning stop. It has been worsening in the last 4-5 months but has been ongoing longer. Not every day. Maybe the days she is more active.   Reviewed notes, labs and imaging from outside physicians, which showed:  Personally reviewed images below and agree with the following:  DG Cervical Spine 04/2015:  Seven cervical segments are well visualized. Vertebral body height is well maintained. No acute fracture or acute facet abnormality is noted. Facet hypertrophic changes are noted with associated neural  foraminal narrowing throughout the cervical spine. The lung apices are within normal limits. The odontoid is within normal limits as well.  IMPRESSION: Degenerative changes of the cervical spine without acute abnormality.  CT HEAD FINDINGS  No evidence for acute infarction, hemorrhage, mass lesion, hydrocephalus, or extra-axial fluid. Generalized atrophy. Chronic microvascular ischemic change affects the periventricular and subcortical white matter. The calvarium appears intact. There is a moderate-sized scalp hematoma and laceration with subcutaneous air in the midline occiput region. No contrecoup injury. No subarachnoid blood. Chronic sinusitis with evidence of functional endoscopic sinus surgery in the maxillary and ethmoid regions. The sphenoid and frontal sinuses are nearly completely filled with fluid. There is proliferative osseous reaction suggesting chronicity.  CT CERVICAL SPINE August 2015:  There is no visible cervical spine fracture or traumatic subluxation. Mild spondylosis affects the C4-5 and C5-6 disc spaces. There is multilevel facet arthropathy. No neck masses are evident. Carotid atherosclerosis is noted. No lung apex lesion is seen.  IMPRESSION: Atrophy and small vessel disease.  Midline scalp hematoma posteriorly with laceration, but no underlying skull fracture or visible intracranial hemorrhage.  Cervical spondylosis. No cervical spine fracture or traumatic subluxation.  CBC in April 2017 unremarkable, CMP in April 2017 with creatinine 1.30 and GFR 39 otherwise unremarkable   Review of Systems: Patient complains of symptoms per HPI as well as the following symptoms: Shortness of breath, spinning sensation, incontinence, joint pain,  can, numbness, dizziness, sleepiness . Pertinent negatives per HPI. All others negative.   Social History   Social History  . Marital Status: Single    Spouse Name: N/A  . Number of Children: 6  . Years of Education: 12    Occupational History  . Retired Orthoptist    also caregiver   Social History Main Topics  . Smoking status: Former Smoker -- 1.00 packs/day for 10 years    Types: Cigarettes    Quit date: 03/30/1991  . Smokeless tobacco: Never Used     Comment: quit smoking in Mar 1993  . Alcohol Use: No  . Drug Use: No  . Sexual Activity: No   Other Topics Concern  . Not on file   Social History Narrative   Originally from [place].    Previously worked in Engineer, mining for [employer]    Has 2 dogs currently.    Remote exposure to parrots in her current home.    No known mold exposure.    Remote travel to [place].    Has 1 indoor plant.    Caffeine use: No soda   2 cups coffee every morning    Family History  Problem Relation Age of Onset  . Heart failure Mother   . Heart failure Father   . Hypertension Sister   . Neuropathy Neg Hx     Past Medical History  Diagnosis Date  . Allergy     uses Flonase daily as needed  . Hypothyroidism     takes Synthroid daily  . Hypertension     takes Losartan and Cardizem daily  . Depression     takes Zoloft daily  . GERD (gastroesophageal reflux disease)     takes Omeprazole daily and Protonix daily as needed  . Anxiety     takes Xanax daily as needed  . History of bronchitis     last time many yrs ago  . Ringing in ears     sees Dr.Byers for this  . Joint pain   . Joint swelling   . Osteoporosis   . Constipation     takes an OTC stool softener  . Urinary frequency   . Urinary urgency   . Urinary incontinence   . Nocturia   . Dry eyes     uses eye drops  . Exertional shortness of breath   . Arthritis     "back" (04/21/2013)  . Scoliosis   . Chronic lower back pain   . Pressure in chest 08/23/13    fell and started having chest pressure, will have ECHO and stress test 09/04/13 before surgery  . Heart murmur     years ago  . H/O hiatal hernia   . Headache(784.0)     sinus  . Allergic rhinitis     Past Surgical History  Procedure  Laterality Date  . Nasal sinus surgery      x 4  . Knee arthroscopy Right   . Shoulder arthroscopy w/ rotator cuff repair Right   . Cataract extraction w/ intraocular lens  implant, bilateral Bilateral   . Tubal ligation    . Colonoscopy    . Esophagogastroduodenoscopy    . Total knee arthroplasty Left 04/21/2013  . Tonsillectomy  1940's  . Total knee arthroplasty Left 04/21/2013    Procedure: TOTAL KNEE ARTHROPLASTY;  Surgeon: Vickey Huger, MD;  Location: [place];  Service: Orthopedics;  Laterality: Left;  Marland Kitchen Eye surgery    . Joint replacement    .  Cardiac catheterization  2005  . Excisional total knee arthroplasty Left 09/08/2013    Procedure: EXCISIONAL TOTAL KNEE ARTHROPLASTY POLYEXCHANGE;  Surgeon: Vickey Huger, MD;  Location: [place];  Service: Orthopedics;  Laterality: Left;    Current Outpatient Prescriptions  Medication Sig Dispense Refill  . albuterol (PROVENTIL HFA;VENTOLIN HFA) 108 (90 BASE) MCG/ACT inhaler Inhale 2 puffs into the lungs every 6 (six) hours as needed for wheezing or shortness of breath. 1 Inhaler 3  . albuterol (PROVENTIL) (2.5 MG/3ML) 0.083% nebulizer solution Take 3 mLs (2.5 mg total) by nebulization every 6 (six) hours as needed for wheezing or shortness of breath. 150 mL 1  . amLODipine (NORVASC) 5 MG tablet TAKE ONE TABLET BY MOUTH ONCE DAILY 30 tablet 5  . Budesonide-Formoterol Fumarate (SYMBICORT IN) Inhale into the lungs as needed.    . Calcium-Vitamin D (CALTRATE 600 PLUS-VIT D PO) Take 1 tablet by mouth daily.    . furosemide (LASIX) 20 MG tablet TAKE ONE TABLET BY MOUTH ONCE DAILY 30 tablet 11  . levothyroxine (SYNTHROID, LEVOTHROID) 125 MCG tablet TAKE ONE TABLET BY MOUTH ONCE DAILY 90 tablet 3  . losartan (COZAAR) 100 MG tablet TAKE ONE TABLET BY MOUTH ONCE DAILY 90 tablet 0  . Omega-3 Fatty Acids (FISH OIL PO) Take 1 capsule by mouth daily.    Marland Kitchen omeprazole (PRILOSEC) 20 MG capsule Take 1 capsule (20 mg total) by mouth daily. 90 capsule 3  . ranitidine  (ZANTAC) 150 MG tablet Take 1 tablet (150 mg total) by mouth at bedtime. 30 tablet 3  . sertraline (ZOLOFT) 100 MG tablet TAKE ONE TABLET BY MOUTH ONCE DAILY 30 tablet 5  . traMADol (ULTRAM) 50 MG tablet Take 1 tablet (50 mg total) by mouth every 8 (eight) hours as needed. 90 tablet 0   No current facility-administered medications for this visit.    Allergies as of 07/20/2015  . (No Known Allergies)    Vitals: BP 135/80 mmHg  Pulse 76  Ht 5\' 3"  (1.6 m)  Wt 164 lb (74.39 kg)  BMI 29.06 kg/m2 Last Weight:  Wt Readings from Last 1 Encounters:  07/20/15 164 lb (74.39 kg)   Last Height:   Ht Readings from Last 1 Encounters:  07/20/15 5\' 3"  (1.6 m)   Physical exam: Exam: Gen: NAD, conversant, well nourised, overweight, well groomed                     CV: RRR, no MRG. No Carotid Bruits. No peripheral edema, warm, nontender Eyes: Conjunctivae clear without exudates or hemorrhage MSK:   Neuro: Detailed Neurologic Exam  Speech:    Speech is normal; fluent and spontaneous with normal comprehension.  Cognition:    The patient is oriented to person, place, and time;     recent and remote memory intact;     language fluent;     normal attention, concentration,     fund of knowledge Cranial Nerves:    The pupils are equal, round, and reactive to light. Attempted funduscopic exam could not visualize due to small pupils. Visual fields are full to finger confrontation. Extraocular movements are intact. Trigeminal sensation is intact and the muscles of mastication are normal. The face is symmetric. The palate elevates in the midline. Hearing intact. Voice is normal. Shoulder shrug is normal. The tongue has normal motion without fasciculations.   Coordination:    Normal finger to nose and heel to shin.   Gait:    Not ataxic, mildly stooped,  kyphotic  Motor Observation:    No asymmetry, no atrophy, and no involuntary movements noted. Tone:    Normal muscle tone.    Posture:     Mildly stooped, kyphotic    Strength: Mild symmetric weakness bilat deltoid, bilat triceps, bilat hip flexors and left prontator teres  otherwise intact including intrinsic hand muscles.      Sensation: intact to LT, decreased pin prick dorsal surface of forearms     Reflex Exam:  DTR's: Brisk uppers, normal patellars, trace AJs     Toes:    The toes are equiv bilaterally.   Clonus:    Clonus is absent.   Phalen's maneuver and Tinel's sign at the wrists negative.     Assessment/Plan:  Lovely 81 year old with neck pain, hand numbness and BPPV.   BPPV: I highly recommend Vestibular therapy so they can review the Epley Maneuver with patient and other vestibular exercises. They can show he rin-depth and practive with her as the Epley is difficult to perform on your own. Explained these exercises will help her and she can do them at home.  Neck pain, hand numbness: Was evaluated at the [facility] and diagnosis was not carpal tunnel syndrome, she was told it is likely radiculopathy. We will order MRI of the cervical spine. Will request records from the [place] including emg/ncs data and waveforms to review.  Occipital neuralgia right: Consider occipital nerve blocks if pain worsens, if MRi c-spine shows impingement at c2-c3 will consider medial branch blocks if necessary  Follow-up 3 months. In the meantime we will be in contact with her after MRI of the cervical spine and can discuss interval plan based on findings.  Sarina Ill, MD  [facility] [address] [place], [place] [address]  Phone [phone] Fax [phone]

## 2015-07-20 NOTE — Patient Instructions (Signed)
Remember to drink plenty of fluid, eat healthy meals and do not skip any meals. Try to eat protein with a every meal and eat a healthy snack such as fruit or nuts in between meals. Try to keep a regular sleep-wake schedule and try to exercise daily, particularly in the form of walking, 20-30 minutes a day, if you can.   As far as diagnostic testing: MRI of the cervical spine, vestibular therapy  I would like to see you back in 3 months, sooner if we need to. Please call us with any interim questions, concerns, problems, updates or refill requests.   Our phone number is [phone]. We also have an after hours call service for urgent matters and there is a physician on-call for urgent questions. For any emergencies you know to call 911 or go to the nearest emergency room

## 2015-07-29 ENCOUNTER — Ambulatory Visit (INDEPENDENT_AMBULATORY_CARE_PROVIDER_SITE_OTHER): Payer: Medicare Other | Admitting: Pulmonary Disease

## 2015-07-29 ENCOUNTER — Encounter (INDEPENDENT_AMBULATORY_CARE_PROVIDER_SITE_OTHER): Payer: Self-pay

## 2015-07-29 DIAGNOSIS — R0600 Dyspnea, unspecified: Secondary | ICD-10-CM

## 2015-07-29 DIAGNOSIS — J309 Allergic rhinitis, unspecified: Secondary | ICD-10-CM

## 2015-07-29 DIAGNOSIS — R05 Cough: Secondary | ICD-10-CM | POA: Diagnosis not present

## 2015-07-29 DIAGNOSIS — R059 Cough, unspecified: Secondary | ICD-10-CM

## 2015-07-29 LAB — PULMONARY FUNCTION TEST
DL/VA % pred: 102 %
DL/VA: 4.33 ml/min/mmHg/L
DLCO UNC % PRED: 94 %
DLCO UNC: 17.85 ml/min/mmHg
DLCO cor % pred: 89 %
DLCO cor: 16.85 ml/min/mmHg
FEF 25-75 POST: 0.81 L/s
FEF 25-75 PRE: 1.07 L/s
FEF2575-%Change-Post: -24 %
FEF2575-%PRED-POST: 65 %
FEF2575-%Pred-Pre: 86 %
FEV1-%CHANGE-POST: -5 %
FEV1-%PRED-POST: 91 %
FEV1-%Pred-Pre: 97 %
FEV1-POST: 1.47 L
FEV1-PRE: 1.57 L
FEV1FVC-%CHANGE-POST: -2 %
FEV1FVC-%Pred-Pre: 98 %
FEV6-%Change-Post: -3 %
FEV6-%Pred-Post: 101 %
FEV6-%Pred-Pre: 104 %
FEV6-PRE: 2.15 L
FEV6-Post: 2.07 L
FEV6FVC-%Change-Post: 0 %
FEV6FVC-%PRED-PRE: 106 %
FEV6FVC-%Pred-Post: 106 %
FVC-%CHANGE-POST: -3 %
FVC-%PRED-PRE: 98 %
FVC-%Pred-Post: 95 %
FVC-PRE: 2.15 L
FVC-Post: 2.07 L
POST FEV1/FVC RATIO: 71 %
PRE FEV6/FVC RATIO: 100 %
Post FEV6/FVC ratio: 100 %
Pre FEV1/FVC ratio: 73 %
RV % PRED: 112 %
RV: 2.44 L
TLC % PRED: 101 %
TLC: 4.5 L

## 2015-07-29 NOTE — Progress Notes (Signed)
PFT done today. 

## 2015-08-02 ENCOUNTER — Ambulatory Visit (INDEPENDENT_AMBULATORY_CARE_PROVIDER_SITE_OTHER): Payer: Medicare Other | Admitting: Pulmonary Disease

## 2015-08-02 ENCOUNTER — Ambulatory Visit
Admission: RE | Admit: 2015-08-02 | Discharge: 2015-08-02 | Disposition: A | Discharge status: Home / Self Care | Payer: Medicare Other | Source: Ambulatory Visit | Attending: Neurology | Admitting: Neurology

## 2015-08-02 ENCOUNTER — Encounter: Payer: Self-pay | Admitting: Pulmonary Disease

## 2015-08-02 VITALS — BP 110/74 | HR 94 | Ht 64.0 in | Wt 162.0 lb

## 2015-08-02 DIAGNOSIS — R200 Anesthesia of skin: Secondary | ICD-10-CM

## 2015-08-02 DIAGNOSIS — R05 Cough: Secondary | ICD-10-CM

## 2015-08-02 DIAGNOSIS — K2100 Gastro-esophageal reflux disease with esophagitis, without bleeding: Secondary | ICD-10-CM

## 2015-08-02 DIAGNOSIS — R29898 Other symptoms and signs involving the musculoskeletal system: Secondary | ICD-10-CM

## 2015-08-02 DIAGNOSIS — R059 Cough, unspecified: Secondary | ICD-10-CM

## 2015-08-02 DIAGNOSIS — J309 Allergic rhinitis, unspecified: Secondary | ICD-10-CM

## 2015-08-02 DIAGNOSIS — M6289 Other specified disorders of muscle: Secondary | ICD-10-CM | POA: Diagnosis not present

## 2015-08-02 DIAGNOSIS — K449 Diaphragmatic hernia without obstruction or gangrene: Secondary | ICD-10-CM

## 2015-08-02 DIAGNOSIS — R0600 Dyspnea, unspecified: Secondary | ICD-10-CM

## 2015-08-02 DIAGNOSIS — K209 Esophagitis, unspecified without bleeding: Secondary | ICD-10-CM | POA: Insufficient documentation

## 2015-08-02 DIAGNOSIS — R208 Other disturbances of skin sensation: Secondary | ICD-10-CM | POA: Diagnosis not present

## 2015-08-02 DIAGNOSIS — M5010 Cervical disc disorder with radiculopathy, unspecified cervical region: Secondary | ICD-10-CM

## 2015-08-02 MED ORDER — RANITIDINE HCL 150 MG PO TABS: 150.0000 mg | ORAL_TABLET | Freq: Two times a day (BID) | ORAL | 3 refills | Status: DC

## 2015-08-02 NOTE — Patient Instructions (Addendum)
   Continue taking your Prilosec.  I'm increasing your Zantac (Ranitidine) to twice daily.  Continue using your nasal rinse at least once daily and over-the-counter allergy medicaiton.  I will see you back in 3 months or sooner if needed.

## 2015-08-02 NOTE — Progress Notes (Signed)
Test reviewed.  

## 2015-08-02 NOTE — Progress Notes (Signed)
Subjective:    Patient ID: Terri Bridges, female    DOB: [birth year], 81 y.o.   MRN: [id number]  C.C.:  Follow-up for Cough, Dyspnea, GERD w/ Hiatal Hernia, & Allergic Rhinitis.  HPI Cough: Sputum culture positive for Candida. She reports she does seem to cough more with activity. Cough producing a white to yellow mucus. No hemoptysis.   Dyspnea: Patient had a normal six-minute walk test and pulmonary function testing today. Still reports dyspnea on exertion. This is limiting her ability to even do her grocery shopping.   GERD w/ Hiatal Hernia: Continued Prilosec & started Zantac at last appointment. Reports her reflux has improved. She reports she is watching her diet. She is sleeping in her recliner some. No abdominal pain.   Allergic Rhinitis: She reports she has been using her sinus rinse at times for her sinus congestion & draingae.   Review of Systems No fever, chills, or sweats. She does have chest pressure but denies any pain. Did have some chest pressure with her walk test today.   No Known Allergies  Current Outpatient Prescriptions on File Prior to Visit  Medication Sig Dispense Refill  . albuterol (PROVENTIL HFA;VENTOLIN HFA) 108 (90 BASE) MCG/ACT inhaler Inhale 2 puffs into the lungs every 6 (six) hours as needed for wheezing or shortness of breath. 1 Inhaler 3  . albuterol (PROVENTIL) (2.5 MG/3ML) 0.083% nebulizer solution Take 3 mLs (2.5 mg total) by nebulization every 6 (six) hours as needed for wheezing or shortness of breath. 150 mL 1  . amLODipine (NORVASC) 5 MG tablet TAKE ONE TABLET BY MOUTH ONCE DAILY 30 tablet 5  . Calcium-Vitamin D (CALTRATE 600 PLUS-VIT D PO) Take 1 tablet by mouth daily.    . furosemide (LASIX) 20 MG tablet TAKE ONE TABLET BY MOUTH ONCE DAILY 30 tablet 11  . levothyroxine (SYNTHROID, LEVOTHROID) 125 MCG tablet TAKE ONE TABLET BY MOUTH ONCE DAILY 90 tablet 3  . losartan (COZAAR) 100 MG tablet TAKE ONE TABLET BY MOUTH ONCE DAILY 90 tablet 0  .  Omega-3 Fatty Acids (FISH OIL PO) Take 1 capsule by mouth daily.    Marland Kitchen omeprazole (PRILOSEC) 20 MG capsule Take 1 capsule (20 mg total) by mouth daily. 90 capsule 3  . sertraline (ZOLOFT) 100 MG tablet TAKE ONE TABLET BY MOUTH ONCE DAILY 30 tablet 5  . traMADol (ULTRAM) 50 MG tablet Take 1 tablet (50 mg total) by mouth every 8 (eight) hours as needed. 90 tablet 0   No current facility-administered medications on file prior to visit.     Past Medical History:  Diagnosis Date  . Allergic rhinitis   . Allergy    uses Flonase daily as needed  . Anxiety    takes Xanax daily as needed  . Arthritis    "back" (04/21/2013)  . Chronic lower back pain   . Constipation    takes an OTC stool softener  . Depression    takes Zoloft daily  . Dry eyes    uses eye drops  . Exertional shortness of breath   . GERD (gastroesophageal reflux disease)    takes Omeprazole daily and Protonix daily as needed  . H/O hiatal hernia   . Headache(784.0)    sinus  . Heart murmur    years ago  . History of bronchitis    last time many yrs ago  . Hypertension    takes Losartan and Cardizem daily  . Hypothyroidism    takes Synthroid daily  .  Joint pain   . Joint swelling   . Nocturia   . Osteoporosis   . Pressure in chest 08/23/13   fell and started having chest pressure, will have ECHO and stress test 09/04/13 before surgery  . Ringing in ears    sees Dr.Byers for this  . Scoliosis   . Urinary frequency   . Urinary incontinence   . Urinary urgency     Past Surgical History:  Procedure Laterality Date  . CARDIAC CATHETERIZATION  2005  . CATARACT EXTRACTION W/ INTRAOCULAR LENS  IMPLANT, BILATERAL Bilateral   . COLONOSCOPY    . ESOPHAGOGASTRODUODENOSCOPY    . EXCISIONAL TOTAL KNEE ARTHROPLASTY Left 09/08/2013   Procedure: EXCISIONAL TOTAL KNEE ARTHROPLASTY POLYEXCHANGE;  Surgeon: Vickey Huger, MD;  Location: [place];  Service: Orthopedics;  Laterality: Left;  . EYE SURGERY    . JOINT REPLACEMENT    .  KNEE ARTHROSCOPY Right   . NASAL SINUS SURGERY     x 4  . SHOULDER ARTHROSCOPY W/ ROTATOR CUFF REPAIR Right   . TONSILLECTOMY  1940's  . TOTAL KNEE ARTHROPLASTY Left 04/21/2013  . TOTAL KNEE ARTHROPLASTY Left 04/21/2013   Procedure: TOTAL KNEE ARTHROPLASTY;  Surgeon: Vickey Huger, MD;  Location: [place];  Service: Orthopedics;  Laterality: Left;  . TUBAL LIGATION      Family History  Problem Relation Age of Onset  . Heart failure Mother   . Heart failure Father   . Hypertension Sister   . Neuropathy Neg Hx     Social History   Social History  . Marital status: Single    Spouse name: N/A  . Number of children: 6  . Years of education: 61   Occupational History  . Retired Orthoptist    also caregiver   Social History Main Topics  . Smoking status: Former Smoker    Packs/day: 1.00    Years: 10.00    Types: Cigarettes    Quit date: 03/30/1991  . Smokeless tobacco: Never Used     Comment: quit smoking in Mar 1993  . Alcohol use No  . Drug use: No  . Sexual activity: No   Other Topics Concern  . None   Social History Narrative   Originally from [place].    Previously worked in Engineer, mining for [employer]    Has 2 dogs currently.    Remote exposure to parrots in her current home.    No known mold exposure.    Remote travel to [place].    Has 1 indoor plant.    Caffeine use: No soda   2 cups coffee every morning      Objective:   Physical Exam BP 110/74 (BP Location: Left Arm, Cuff Size: Normal)   Pulse 94   Ht 5\' 4"  (1.626 m)   Wt 162 lb (73.5 kg)   SpO2 97%   BMI 27.81 kg/m  General:  Awake. Alert. No distress. Coughing Intermittently. Integument:  Warm & dry. No rash on exposed skin.  Lymphatics:  No appreciated cervical or supraclavicular lymphadenoapthy. HEENT:  Moist mucus membranes. No oral ulcers. No significant nasal turbinate swelling. Cardiovascular:  Regular rate. No edema. Normal S1 & S2. Pulmonary:  Clear bilaterally to auscultation. Speaking in complete  sentences. Normal work of breathing on room air.  Abdomen: Soft. Normal bowel sounds. Nontender.  PFT 07/29/15: FVC 2.15 L (98%) FEV1 1.57 L (97%) FEV1/FVC 0.73 FEF 25-75 1.07 L (86%) no bronchodilator response TLC 4.50 L (101%) RV 112%  ERV 83% DLCO uncorrected 89% (hemoglobin 15.5)  6MWT 08/02/15:  Walked 351 meters / Baseline Sat 97% on RA / Nadir Sat 97% on RA (complained of right knee pain & chest heaviness)  IMAGING BARIUM SWALLOW (06/18/15): Moderate to large hiatal hernia mildly increased in size since 2008. Spontaneous moderate reflux with mild reflux esophagitis. No esophageal ulcer, mass, or stricture detected. Moderate cricopharyngeus muscle dysfunction & mild to moderate esophageal dysmotility characteristic of chronic GERD. Normal oral and pharyngeal phases of swallowing. No laryngeal penetration or tracheal bronchial aspiration.  CXR PA/LAT 05/07/15 (previously reviewed by me): Kyphosis noted on lateral views. Hiatal hernia noted. No parenchymal nodule or opacity appreciated otherwise. Mild hyperinflation with flattening of the diaphragms. Heart normal in size & mediastinum otherwise normal in contour. No pleural effusion appreciated.  CARDIAC TTE (09/04/13): LV EF 55-60%. LA & RA normal in size. RV normal in size and function. No aortic stenosis or regurgitation. Trivial mitral regurgitation without stenosis. No tricuspid regurgitation. No pericardial effusion.  MICROBIOLOGY Sputum Ctx 06/18/15:  Oral Flora / Candida dubliniensis / AFB negative   LABS 06/14/15 IgG: 558 IgE: 10 IgA: 131 IgM: 59 RAST Panel: Negative  06/03/15 BNP: 32.0  05/07/15 CBC: 5.3/13.8/41.7/202 BMP: 140/4.1/99/27/19/1.3/95/9.3 LFT: 4.3/6.7/0.6/75/20/13    Assessment & Plan:  81 year old with ongoing cough likely secondary to her underlying reflux and hiatal hernia. Esophagitis noted on her barium swallow/esophagogram. We did discuss the potential for Barrett's esophagus versus early esophageal cancer  and the potential need for upper endoscopy and evaluation by gastroenterology. Patient declines at this time. With her ongoing cough I do question whether or not we need to perform more in depth chest imaging such as CT imaging to rule out an underlying occult malignancy; however, the patient declines CT imaging at this time despite my explanation of reasoning. I am attempting to increase her reflux suppression due to some relief and recommended she contact my office if she had any new breathing problems or questions before her next appointment. We did review her previous microbiology as well as her pulmonary function testing at this visit. Her 6 minute walk test shows no significant desaturation and her pulmonary function testing from last week was completely normal.  1. Cough: Increasing reflux suppression. Patient counseled to notify me for any new symptoms or increasing mucus. Patient declining CT chest without contrast.  2. Dyspnea: Likely multifactorial. Patient declining CT chest without contrast.  3. GERD w/ Hiatal Hernia: Increasing Zantac to 150 mg by mouth twice a day & continuing Prilosec. Patient declining referral to GI.  4. Allergic Rhinitis: Good control. Continuing over-the-counter antihistamine and twice daily nasal saline rinse.  5. Follow-up: Patient to return to clinic in  3 months or sooner if needed.  Sonia Baller Ashok Cordia, M.D. [facility] Pager:  [phone] After 3pm or if no response, call [phone] 11:17 AM 08/02/15

## 2015-08-04 ENCOUNTER — Telehealth: Payer: Self-pay | Admitting: *Deleted

## 2015-08-04 NOTE — Telephone Encounter (Signed)
Patient called back for her MRI results.  Thanks!

## 2015-08-04 NOTE — Telephone Encounter (Signed)
-----   Message from Melvenia Beam, MD sent at 08/03/2015  6:00 PM EDT ----- mild multilevel degenerative(arthritic) changes. There is no nerve root pinching. There is nothing seen in the cervical spine to cause her occipital head pain, the nerve root pinching is not in the neck. thanks

## 2015-08-04 NOTE — Telephone Encounter (Signed)
Called and spoke to pt about MRI results per Dr Jaynee Eagles. Pt verbalized understanding.

## 2015-08-04 NOTE — Telephone Encounter (Signed)
LVM for pt to call about MRI results. Gave [facility] phone number.

## 2015-08-04 NOTE — Telephone Encounter (Signed)
I received message from phone staff that pt called back about results. I went to pick up call, but they stated patient already hung up. I tried calling pt right back, but line busy. Will try again later.

## 2015-08-24 LAB — AFB CULTURE WITH SMEAR (NOT AT ARMC)

## 2015-08-24 LAB — FUNGUS CULTURE W SMEAR

## 2015-08-24 LAB — OTHER SOLSTAS TEST: Miscellaneous Test: 11137

## 2015-09-07 ENCOUNTER — Encounter: Payer: Self-pay | Admitting: *Deleted

## 2015-09-15 ENCOUNTER — Telehealth: Payer: Self-pay | Admitting: Pulmonary Disease

## 2015-09-15 NOTE — Telephone Encounter (Signed)
Spoke with patient, states that she received a letter for results.  Pt states that she still has cough that is waking her up at night - pt relates this to weather changes.  Pt states that she will call if she feels that her lungs are getting bad and she decides to get the CT.  Will send to Dr Ashok Cordia as Juluis Rainier.  Notes Recorded by Javier Glazier, MD on 08/27/2015 at 10:12 AM EDT Please let the patient know her culture grew out an atypical bacteria that can be related to reflux. Ask her how her cough is doing. My recommendation still stands to do a CT of the chest without contrast to make sure this bug is not causing damage to her lungs, especially if she is still having a persistent cough despite the medication changes we made last time. If she still doesn't want to have the CT scan then we will obviously respect her wishes. Thanks.

## 2015-09-15 NOTE — Telephone Encounter (Signed)
Patient returning call -pr °

## 2015-09-15 NOTE — Telephone Encounter (Signed)
LMTCB

## 2015-10-20 ENCOUNTER — Ambulatory Visit (INDEPENDENT_AMBULATORY_CARE_PROVIDER_SITE_OTHER): Payer: Medicare Other | Admitting: Neurology

## 2015-10-20 ENCOUNTER — Encounter: Payer: Self-pay | Admitting: Neurology

## 2015-10-20 VITALS — BP 150/77 | HR 83 | Ht 64.0 in | Wt 160.8 lb

## 2015-10-20 DIAGNOSIS — M4722 Other spondylosis with radiculopathy, cervical region: Secondary | ICD-10-CM | POA: Diagnosis not present

## 2015-10-20 NOTE — Progress Notes (Signed)
[facility]    Provider:  Dr Jaynee Eagles Referring Provider: Elby Showers, MD Primary Care Physician:  Elby Showers, MD  CC:  Neck pain and hands going numb  Interval history 10/20/2015:  Reviewed findings of MRI cervical spine below. There was multilevel degenerative changes but no nerve root impingement however given her arthritic changes unclear if there is dynamic impingement with head movements which is why she gets numbness positionally when reading a book looking down for example. The multilevel degenerative changes are likely causing her neck pain. I offered her another emg/ncs for her hands but she declined, she is not interested. I recommended physical therapy for her neck or sending her to pain management for evaluation for facet blocks but she declines. I also recommended vestibular therapy for her dizziness but she declines. She says she has too many things going on right now.   FINDINGS: :  On sagittal images, the spine is imaged from above the cervicomedullary junction to T2.   The spinal cord is of normal caliber and signal.   There is accentuated lordosis of the cervical spine curvature.   The vertebral bodies have normal signal.  The discs and interspaces were further evaluated on axial views from C2 to T1 as follows: C2 - C3:  The disc and interspace appear normal. C3 - C4:  There is mild left facet hypertrophy, minimal left uncovertebral spurring and minimal disc bulging. Left neural foramen is mildly narrowed but there is no nerve root impingement. C4 - C5:  There is left facet hypertrophy, left greater than right uncovertebral spurring and minimal disc bulging. The left foramen is moderately narrowed. There does not appear to be nerve root compression. C5 - C6:  There is uncovertebral spurring bilaterally and mild left facet hypertrophy and minimal disc bulging. The central canal is slightly narrowed but not enough to be considered spinal stenosis. There is  mild-to-moderate right and mild left foraminal narrowing. There does not appear to be any nerve root compression.. C6 - C7:  There is a small left paramedian disc protrusion associated with mild left greater than right uncovertebral spurring. The left foramen is mildly narrowed but there does not appear to be any nerve root compression.. C7 - T1:  The disc and interspace appear normal.   IMPRESSION:  This MRI of the cervical spine shows mild multilevel degenerative changes at C3-C4, C4-C5, C5-C6 and C6-C7 as detailed above. There does not appear to be any nerve root compression at these levels.   The spinal cord appears normal. There are no acute findings.  HPI:  Terri Bridges is a 81 y.o. female here as a referral from Dr. Renold Genta for neck pain and hands going numb. No inciting events or trauma. Past medical history of HTN, hypothyroidism, BPPV, arthritis, anxirty, depression.She has had neck pain for one year and worsening, it "grabs" and she has to hold her neck because it hurts, tender on the right in the back of the right ear, she has point tenderness at the spine (points to c7 approx), muscular and tight, can't turn her head as much anymore. She has numbness in her heands especially if she is sitting holding a book and holding a laptop it starts in the fingers. Started in the right hand and starts hurting in fingers 2-4 and she has to tap them and rub them to wake them up. She does not wake up with numbness in the night or the morning. But at night if she is  reading a magazine her hands will go numb. She feels weakness in her hands as well. She is dropping objects, grip strength is poor. Sounds like she had an emg/mcs at the [facility]. They told her it They wanted her to [facility] but she came here instead.   Another problem is dizziness. She gets dizzy when she rolls over in bed. When she goes to bed she has roll over slowly and has to lay still and the room spinning stop. It has  been worsening in the last 4-5 months but has been ongoing longer. Not every day. Maybe the days she is more active.   Reviewed notes, labs and imaging from outside physicians, which showed:  Personally reviewed images below and agree with the following:  DG Cervical Spine 04/2015:  Seven cervical segments are well visualized. Vertebral body height is well maintained. No acute fracture or acute facet abnormality is noted. Facet hypertrophic changes are noted with associated neural foraminal narrowing throughout the cervical spine. The lung apices are within normal limits. The odontoid is within normal limits as well.  IMPRESSION: Degenerative changes of the cervical spine without acute abnormality.  CT HEAD FINDINGS  No evidence for acute infarction, hemorrhage, mass lesion, hydrocephalus, or extra-axial fluid. Generalized atrophy. Chronic microvascular ischemic change affects the periventricular and subcortical white matter. The calvarium appears intact. There is a moderate-sized scalp hematoma and laceration with subcutaneous air in the midline occiput region. No contrecoup injury. No subarachnoid blood. Chronic sinusitis with evidence of functional endoscopic sinus surgery in the maxillary and ethmoid regions. The sphenoid and frontal sinuses are nearly completely filled with fluid. There is proliferative osseous reaction suggesting chronicity.  CT CERVICAL SPINE August 2015:  There is no visible cervical spine fracture or traumatic subluxation. Mild spondylosis affects the C4-5 and C5-6 disc spaces. There is multilevel facet arthropathy. No neck masses are evident. Carotid atherosclerosis is noted. No lung apex lesion is seen.  IMPRESSION: Atrophy and small vessel disease.  Midline scalp hematoma posteriorly with laceration, but no underlying skull fracture or visible intracranial hemorrhage.  Cervical spondylosis. No cervical spine fracture or traumatic subluxation.  CBC in  April 2017 unremarkable, CMP in April 2017 with creatinine 1.30 and GFR 39 otherwise unremarkable   Review of Systems: Patient complains of symptoms per HPI as well as the following symptoms: Shortness of breath, spinning sensation, incontinence, joint pain, can, numbness, dizziness, sleepiness . Pertinent negatives per HPI. All others negative.   Social History   Social History  . Marital status: Single    Spouse name: N/A  . Number of children: 6  . Years of education: 25   Occupational History  . Retired Orthoptist    also caregiver   Social History Main Topics  . Smoking status: Former Smoker    Packs/day: 1.00    Years: 10.00    Types: Cigarettes    Quit date: 03/30/1991  . Smokeless tobacco: Never Used     Comment: quit smoking in Mar 1993  . Alcohol use No  . Drug use: No  . Sexual activity: No   Other Topics Concern  . Not on file   Social History Narrative   Originally from [place].    Previously worked in Engineer, mining for [employer]    Has 2 dogs currently.    Remote exposure to parrots in her current home.    No known mold exposure.    Remote travel to [place].    Has 1 indoor  plant.    Caffeine use: No soda   2 cups coffee every morning    Family History  Problem Relation Age of Onset  . Heart failure Mother   . Heart failure Father   . Hypertension Sister   . Neuropathy Neg Hx     Past Medical History:  Diagnosis Date  . Allergic rhinitis   . Allergy    uses Flonase daily as needed  . Anxiety    takes Xanax daily as needed  . Arthritis    "back" (04/21/2013)  . Chronic lower back pain   . Constipation    takes an OTC stool softener  . Depression    takes Zoloft daily  . Dry eyes    uses eye drops  . Exertional shortness of breath   . GERD (gastroesophageal reflux disease)    takes Omeprazole daily and Protonix daily as needed  . H/O hiatal hernia   . Headache(784.0)    sinus  . Heart murmur    years ago  . History of bronchitis    last time  many yrs ago  . Hypertension    takes Losartan and Cardizem daily  . Hypothyroidism    takes Synthroid daily  . Joint pain   . Joint swelling   . Nocturia   . Osteoporosis   . Pressure in chest 08/23/13   fell and started having chest pressure, will have ECHO and stress test 09/04/13 before surgery  . Ringing in ears    sees Dr.Byers for this  . Scoliosis   . Urinary frequency   . Urinary incontinence   . Urinary urgency     Past Surgical History:  Procedure Laterality Date  . CARDIAC CATHETERIZATION  2005  . CATARACT EXTRACTION W/ INTRAOCULAR LENS  IMPLANT, BILATERAL Bilateral   . COLONOSCOPY    . ESOPHAGOGASTRODUODENOSCOPY    . EXCISIONAL TOTAL KNEE ARTHROPLASTY Left 09/08/2013   Procedure: EXCISIONAL TOTAL KNEE ARTHROPLASTY POLYEXCHANGE;  Surgeon: Vickey Huger, MD;  Location: [facility];  Service: Orthopedics;  Laterality: Left;  . EYE SURGERY    . JOINT REPLACEMENT    . KNEE ARTHROSCOPY Right   . NASAL SINUS SURGERY     x 4  . SHOULDER ARTHROSCOPY W/ ROTATOR CUFF REPAIR Right   . TONSILLECTOMY  1940's  . TOTAL KNEE ARTHROPLASTY Left 04/21/2013  . TOTAL KNEE ARTHROPLASTY Left 04/21/2013   Procedure: TOTAL KNEE ARTHROPLASTY;  Surgeon: Vickey Huger, MD;  Location: [facility];  Service: Orthopedics;  Laterality: Left;  . TUBAL LIGATION      Current Outpatient Prescriptions  Medication Sig Dispense Refill  . albuterol (PROVENTIL HFA;VENTOLIN HFA) 108 (90 BASE) MCG/ACT inhaler Inhale 2 puffs into the lungs every 6 (six) hours as needed for wheezing or shortness of breath. 1 Inhaler 3  . albuterol (PROVENTIL) (2.5 MG/3ML) 0.083% nebulizer solution Take 3 mLs (2.5 mg total) by nebulization every 6 (six) hours as needed for wheezing or shortness of breath. 150 mL 1  . amLODipine (NORVASC) 5 MG tablet TAKE ONE TABLET BY MOUTH ONCE DAILY 30 tablet 5  . budesonide-formoterol (SYMBICORT) 160-4.5 MCG/ACT inhaler Inhale 2 puffs into the lungs 3 times/day as needed-between meals & bedtime.    .  Calcium-Vitamin D (CALTRATE 600 PLUS-VIT D PO) Take 1 tablet by mouth daily.    . furosemide (LASIX) 20 MG tablet TAKE ONE TABLET BY MOUTH ONCE DAILY 30 tablet 11  . levothyroxine (SYNTHROID, LEVOTHROID) 125 MCG tablet TAKE ONE TABLET BY MOUTH ONCE DAILY  90 tablet 3  . losartan (COZAAR) 100 MG tablet TAKE ONE TABLET BY MOUTH ONCE DAILY 90 tablet 0  . Omega-3 Fatty Acids (FISH OIL PO) Take 1 capsule by mouth daily.    Marland Kitchen omeprazole (PRILOSEC) 20 MG capsule Take 1 capsule (20 mg total) by mouth daily. 90 capsule 3  . ranitidine (ZANTAC) 150 MG tablet Take 1 tablet (150 mg total) by mouth 2 (two) times daily. 60 tablet 3  . sertraline (ZOLOFT) 100 MG tablet TAKE ONE TABLET BY MOUTH ONCE DAILY 30 tablet 5  . traMADol (ULTRAM) 50 MG tablet Take 1 tablet (50 mg total) by mouth every 8 (eight) hours as needed. 90 tablet 0   No current facility-administered medications for this visit.     Allergies as of 10/20/2015  . (No Known Allergies)    Vitals: BP (!) 150/77 (BP Location: Right Arm, Patient Position: Sitting, Cuff Size: Normal)   Pulse 83   Ht 5\' 4"  (1.626 m)   Wt 72.9 kg (160 lb 12.8 oz)   BMI 27.60 kg/m  Last Weight:  Wt Readings from Last 1 Encounters:  10/20/15 72.9 kg (160 lb 12.8 oz)   Last Height:   Ht Readings from Last 1 Encounters:  10/20/15 5\' 4"  (1.626 m)        Physical exam: Exam: Gen: NAD, conversant, well nourised, overweight, well groomed                     CV: RRR, no MRG. No Carotid Bruits. No peripheral edema, warm, nontender Eyes: Conjunctivae clear without exudates or hemorrhage MSK:   Neuro: Detailed Neurologic Exam  Speech:    Speech is normal; fluent and spontaneous with normal comprehension.  Cognition:    The patient is oriented to person, place, and time;     recent and remote memory intact;     language fluent;     normal attention, concentration,     fund of knowledge Cranial Nerves:    The pupils are equal, round, and reactive to  light. Attempted funduscopic exam could not visualize due to small pupils. Visual fields are full to finger confrontation. Extraocular movements are intact. Trigeminal sensation is intact and the muscles of mastication are normal. The face is symmetric. The palate elevates in the midline. Hearing intact. Voice is normal. Shoulder shrug is normal. The tongue has normal motion without fasciculations.   Coordination:    Normal finger to nose and heel to shin.   Gait:    Not ataxic, mildly stooped, kyphotic  Motor Observation:    No asymmetry, no atrophy, and no involuntary movements noted. Tone:    Normal muscle tone.    Posture:    Mildly stooped, kyphotic    Strength: Mild symmetric weakness bilat deltoid, bilat triceps, bilat hip flexors and left prontator teres  otherwise intact including intrinsic hand muscles.      Sensation: intact to LT, decreased pin prick dorsal surface of forearms     Reflex Exam:  DTR's: Brisk uppers, normal patellars, trace AJs     Toes:    The toes are equiv bilaterally.   Clonus:    Clonus is absent.   Phalen's maneuver and Tinel's sign at the wrists negative.     Assessment/Plan:  Lovely 81 year old with neck pain, hand numbness and BPPV. Reviewed findings of MRI cervical spine below. There was multilevel degenerative changes but no nerve root impingement however given her arthritic changes unclear if there is  dynamic impingement with head movements which is why she gets numbness positionally when reading a book for example. The multilevel degenerative changes are likely causing her neck pain. I offered her another emg/ncs but she declined, she is not interested. I recommended physical therapy for her neck or sending her to pain management for evaluation for facet blocks but she declines. I also recommended vestibular therapy for her dizziness but she declines. She says she has too many things going on right now.   Patient declines the  following recommendations at this time:   BPPV: I highly recommend Vestibular therapy so they can review the Epley Maneuver with patient and other vestibular exercises. They can show her in-depth and practive with her as the Epley is difficult to perform on your own. Explained these exercises will help her and she can do them at home.   Neck pain, hand numbness: Was evaluated at the [facility] and diagnosis was not carpal tunnel syndrome, she was told it is likely radiculopathy. MRI of the cervical spine showed multilevel degenerative changes but no nerve root impingement. May need to repeat EMG/NCS to eval for CTS vs Ulnar neuropathy. Recommended PT or facet blocks.   Occipital neuralgia right: Consider occipital nerve blocks if pain worsens.  Sarina Ill, MD  [facility] [address] [place],  [address]  Phone [phone] Fax [phone]  A total of 15 minutes was spent face-to-face with this patient. Over half this time was spent on counseling patient on the multilevel degenerative cervical changes diagnosis and different diagnostic and therapeutic options available.

## 2015-10-27 ENCOUNTER — Encounter: Payer: Self-pay | Admitting: Nurse Practitioner

## 2015-10-27 ENCOUNTER — Encounter: Payer: Self-pay | Admitting: Pulmonary Disease

## 2015-10-27 ENCOUNTER — Ambulatory Visit (INDEPENDENT_AMBULATORY_CARE_PROVIDER_SITE_OTHER): Payer: Medicare Other | Admitting: Pulmonary Disease

## 2015-10-27 VITALS — BP 124/68 | HR 79 | Ht 64.0 in | Wt 159.0 lb

## 2015-10-27 DIAGNOSIS — R0602 Shortness of breath: Secondary | ICD-10-CM

## 2015-10-27 DIAGNOSIS — R059 Cough, unspecified: Secondary | ICD-10-CM

## 2015-10-27 DIAGNOSIS — K209 Esophagitis, unspecified without bleeding: Secondary | ICD-10-CM

## 2015-10-27 DIAGNOSIS — R05 Cough: Secondary | ICD-10-CM | POA: Diagnosis not present

## 2015-10-27 DIAGNOSIS — K219 Gastro-esophageal reflux disease without esophagitis: Secondary | ICD-10-CM

## 2015-10-27 DIAGNOSIS — J302 Other seasonal allergic rhinitis: Secondary | ICD-10-CM

## 2015-10-27 DIAGNOSIS — Z23 Encounter for immunization: Secondary | ICD-10-CM

## 2015-10-27 NOTE — Addendum Note (Signed)
Addended by: Len Blalock on: 10/27/2015 09:46 AM   Modules accepted: Orders

## 2015-10-27 NOTE — Progress Notes (Signed)
Subjective:    Patient ID: Terri Bridges, female    DOB: Apr 01, 1934, 81 y.o.   MRN: [id number]  C.C.:  Follow-up for Cough, Dyspnea, GERD w/ Hiatal Hernia, & Allergic Rhinitis.  HPI Cough: Sputum culture previously unrevealing. Increased gastric acid suppression at last appointment. Patient previously declined CT chest. She reports her cough has largely unchanged. She is still coughing, mostly in the morning. She does use her nebulizer twice daily which seems to help with her cough. She does wake up in the middle of the night with coughing if she doesn't use her nebulizer. She has had some wheezing.   Dyspnea: Previously normal 6 minute walk test and pulmonary function testing. Likely multifactorial. Declines chest without contrast. She reports varying dyspnea.   GERD w/ Hiatal Hernia: Barium swallow did note esophagitis previously. Previously declined referral to GI. Zantac increased to 150 mg twice daily at last appointment & continued on Prilosec. She reports her reflux is better. She is avoiding certain foods. She denies any dysphagia or odynophagia. No morning brash water taste.  Allergic Rhinitis: Previously using over-the-counter antihistamine therapy and twice daily nasal saline rinse.  Review of Systems No chest pain or pressure. No fever or chills. No headaches or near syncope.   No Known Allergies  Current Outpatient Prescriptions on File Prior to Visit  Medication Sig Dispense Refill  . albuterol (PROVENTIL HFA;VENTOLIN HFA) 108 (90 BASE) MCG/ACT inhaler Inhale 2 puffs into the lungs every 6 (six) hours as needed for wheezing or shortness of breath. 1 Inhaler 3  . albuterol (PROVENTIL) (2.5 MG/3ML) 0.083% nebulizer solution Take 3 mLs (2.5 mg total) by nebulization every 6 (six) hours as needed for wheezing or shortness of breath. 150 mL 1  . amLODipine (NORVASC) 5 MG tablet TAKE ONE TABLET BY MOUTH ONCE DAILY 30 tablet 5  . budesonide-formoterol (SYMBICORT) 160-4.5 MCG/ACT  inhaler Inhale 2 puffs into the lungs 3 times/day as needed-between meals & bedtime.    . Calcium-Vitamin D (CALTRATE 600 PLUS-VIT D PO) Take 1 tablet by mouth daily.    . furosemide (LASIX) 20 MG tablet TAKE ONE TABLET BY MOUTH ONCE DAILY 30 tablet 11  . levothyroxine (SYNTHROID, LEVOTHROID) 125 MCG tablet TAKE ONE TABLET BY MOUTH ONCE DAILY 90 tablet 3  . losartan (COZAAR) 100 MG tablet TAKE ONE TABLET BY MOUTH ONCE DAILY 90 tablet 0  . Omega-3 Fatty Acids (FISH OIL PO) Take 1 capsule by mouth daily.    Marland Kitchen omeprazole (PRILOSEC) 20 MG capsule Take 1 capsule (20 mg total) by mouth daily. 90 capsule 3  . ranitidine (ZANTAC) 150 MG tablet Take 1 tablet (150 mg total) by mouth 2 (two) times daily. 60 tablet 3  . sertraline (ZOLOFT) 100 MG tablet TAKE ONE TABLET BY MOUTH ONCE DAILY 30 tablet 5  . traMADol (ULTRAM) 50 MG tablet Take 1 tablet (50 mg total) by mouth every 8 (eight) hours as needed. 90 tablet 0   No current facility-administered medications on file prior to visit.     Past Medical History:  Diagnosis Date  . Allergic rhinitis   . Allergy    uses Flonase daily as needed  . Anxiety    takes Xanax daily as needed  . Arthritis    "back" (04/21/2013)  . Chronic lower back pain   . Constipation    takes an OTC stool softener  . Depression    takes Zoloft daily  . Dry eyes    uses eye drops  .  Exertional shortness of breath   . GERD (gastroesophageal reflux disease)    takes Omeprazole daily and Protonix daily as needed  . H/O hiatal hernia   . Headache(784.0)    sinus  . Heart murmur    years ago  . History of bronchitis    last time many yrs ago  . Hypertension    takes Losartan and Cardizem daily  . Hypothyroidism    takes Synthroid daily  . Joint pain   . Joint swelling   . Nocturia   . Osteoporosis   . Pressure in chest 08/23/13   fell and started having chest pressure, will have ECHO and stress test 09/04/13 before surgery  . Ringing in ears    sees Dr.Byers for  this  . Scoliosis   . Urinary frequency   . Urinary incontinence   . Urinary urgency     Past Surgical History:  Procedure Laterality Date  . CARDIAC CATHETERIZATION  2005  . CATARACT EXTRACTION W/ INTRAOCULAR LENS  IMPLANT, BILATERAL Bilateral   . COLONOSCOPY    . ESOPHAGOGASTRODUODENOSCOPY    . EXCISIONAL TOTAL KNEE ARTHROPLASTY Left 09/08/2013   Procedure: EXCISIONAL TOTAL KNEE ARTHROPLASTY POLYEXCHANGE;  Surgeon: Vickey Huger, MD;  Location: [place];  Service: Orthopedics;  Laterality: Left;  . EYE SURGERY    . JOINT REPLACEMENT    . KNEE ARTHROSCOPY Right   . NASAL SINUS SURGERY     x 4  . SHOULDER ARTHROSCOPY W/ ROTATOR CUFF REPAIR Right   . TONSILLECTOMY  1940's  . TOTAL KNEE ARTHROPLASTY Left 04/21/2013  . TOTAL KNEE ARTHROPLASTY Left 04/21/2013   Procedure: TOTAL KNEE ARTHROPLASTY;  Surgeon: Vickey Huger, MD;  Location: [place];  Service: Orthopedics;  Laterality: Left;  . TUBAL LIGATION      Family History  Problem Relation Age of Onset  . Heart failure Mother   . Heart failure Father   . Hypertension Sister   . Neuropathy Neg Hx     Social History   Social History  . Marital status: Single    Spouse name: N/A  . Number of children: 6  . Years of education: 49   Occupational History  . Retired Orthoptist    also caregiver   Social History Main Topics  . Smoking status: Former Smoker    Packs/day: 1.00    Years: 10.00    Types: Cigarettes    Quit date: 03/30/1991  . Smokeless tobacco: Never Used     Comment: quit smoking in Mar 1993  . Alcohol use No  . Drug use: No  . Sexual activity: No   Other Topics Concern  . None   Social History Narrative   Originally from [place].    Previously worked in Engineer, mining for [employer]    Has 2 dogs currently.    Remote exposure to parrots in her current home.    No known mold exposure.    Remote travel to [place].    Has 1 indoor plant.    Caffeine use: No soda   2 cups coffee every morning      Objective:   Physical  Exam BP 124/68 (BP Location: Left Arm, Cuff Size: Normal)   Pulse 79   Ht 5\' 4"  (1.626 m)   Wt 159 lb (72.1 kg)   SpO2 97%   BMI 27.29 kg/m  General:  Awake. Alert. Comfortable. No distress. Integument:  Warm & dry. No rash on exposed skin.  Lymphatics:  No appreciated  cervical or supraclavicular lymphadenoapthy. HEENT: No nasal turbinate swelling. No oral ulcers. Moist mucous membranes. Cardiovascular:  Regular rate and rhythm. No edema. Normal S1 & S2. Pulmonary:   no accessory muscle use on room air. Clear on auscultation. Speaking in complete sentences.  Abdomen: Soft. Normal bowel sounds. Nontender.  PFT 07/29/15: FVC 2.15 L (98%) FEV1 1.57 L (97%) FEV1/FVC 0.73 FEF 25-75 1.07 L (86%) no bronchodilator response TLC 4.50 L (101%) RV 112% ERV 83% DLCO uncorrected 89% (hemoglobin 15.5)  6MWT 08/02/15:  Walked 351 meters / Baseline Sat 97% on RA / Nadir Sat 97% on RA (complained of right knee pain & chest heaviness)  IMAGING BARIUM SWALLOW 06/18/15 (per radiologist): Moderate to large hiatal hernia mildly increased in size since 2008. Spontaneous moderate reflux with mild reflux esophagitis. No esophageal ulcer, mass, or stricture detected. Moderate cricopharyngeus muscle dysfunction & mild to moderate esophageal dysmotility characteristic of chronic GERD. Normal oral and pharyngeal phases of swallowing. No laryngeal penetration or tracheal bronchial aspiration.  CXR PA/LAT 05/07/15 (previously reviewed by me): Kyphosis noted on lateral views. Hiatal hernia noted. No parenchymal nodule or opacity appreciated otherwise. Mild hyperinflation with flattening of the diaphragms. Heart normal in size & mediastinum otherwise normal in contour. No pleural effusion appreciated.  CARDIAC TTE (09/04/13): LV EF 55-60%. LA & RA normal in size. RV normal in size and function. No aortic stenosis or regurgitation. Trivial mitral regurgitation without stenosis. No tricuspid regurgitation. No pericardial  effusion.  MICROBIOLOGY Sputum Ctx 06/18/15:  Oral Flora / Candida dubliniensis / AFB negative   LABS 06/14/15 IgG: 558 IgE: 10 IgA: 131 IgM: 59 RAST Panel: Negative  06/03/15 BNP: 32.0  05/07/15 CBC: 5.3/13.8/41.7/202 BMP: 140/4.1/99/27/19/1.3/95/9.3 LFT: 4.3/6.7/0.6/75/20/13    Assessment & Plan:  81 y.o. female with cough likely secondary to her underlying reflux and hiatal hernia. Esophagitis noted on her barium swallow/esophagogram. Cough seems to be stable. No clear parenchymal abnormality. With patient's esophageal thickening/esophagitis seen on barium swallow I do feel that evaluation by GI is necessary at this time. We discussed the possibility that this could be a precancerous process. Her reflux does seem to be better controlled at this time which is encouraging. With her ongoing cough and dyspnea in the setting of normal pulmonary function testing I feel that evaluating her lung parenchyma is the next step. I instructed the patient contact my office if she had any new breathing problems or questions before next appointment.  1. Cough:  Likely secondary to reflux. Checking CT chest without contrast. Recommend continued use of albuterol via nebulizer for airway clearance.  2. Dyspnea: Likely multifactorial. Checking CT chest without contrast. 3. GERD w/ Hiatal Hernia:  Continuing Prilosec & Zantac. Referring to gastroenterology for evaluation and possible EGD. 4. Allergic Rhinitis: Controlled. Continuing over-the-counter antihistamine therapy.  5. Health Maintenance:  S/P  Prevnar January 2017, Pneumovax 01 Jun 2003, & Tdap March 2015. She minister and high-dose influenza vaccine today. 6. Follow-up: Patient to return to clinic in 3 months or sooner if needed.  Sonia Baller Ashok Cordia, M.D. [facility] Pager:  [phone] After 3pm or if no response, call [phone] 9:30 AM 10/27/15

## 2015-10-27 NOTE — Patient Instructions (Signed)
   Call me if you feel your breathing or cough is getting any worse.  I am referring you to a GI specialist to check out the inflammation in your esophagus which could be pre-cancerous.  We will review your CT scan at your follow-up appointment in 3 months.  TESTS ORDERED: 1. CT Chest w/o

## 2015-11-03 ENCOUNTER — Ambulatory Visit (INDEPENDENT_AMBULATORY_CARE_PROVIDER_SITE_OTHER): Payer: Medicare Other | Admitting: Nurse Practitioner

## 2015-11-03 ENCOUNTER — Encounter: Payer: Self-pay | Admitting: Nurse Practitioner

## 2015-11-03 VITALS — BP 124/70 | HR 81 | Ht 60.0 in | Wt 158.0 lb

## 2015-11-03 DIAGNOSIS — R933 Abnormal findings on diagnostic imaging of other parts of digestive tract: Secondary | ICD-10-CM | POA: Diagnosis not present

## 2015-11-03 DIAGNOSIS — K219 Gastro-esophageal reflux disease without esophagitis: Secondary | ICD-10-CM

## 2015-11-03 NOTE — Patient Instructions (Signed)
It has been recommended to you by your physician that you have a(n) EGD completed. Per your request, we did not schedule the procedure(s) today. Please contact our office at [phone] should you decide to have the procedure completed.  If you are age 79 or older, your body mass index should be between 23-30. Your Body mass index is 30.86 kg/m. If this is out of the aforementioned range listed, please consider follow up with your Primary Care Provider.  If you are age 6 or younger, your body mass index should be between 19-25. Your Body mass index is 30.86 kg/m. If this is out of the aformentioned range listed, please consider follow up with your Primary Care Provider.   Thank you for choosing [facility]

## 2015-11-03 NOTE — Progress Notes (Signed)
HPI: Patient is a 81 year old female referred by Pulmonologist Tera Partridge for GERD. Patient has a chronic cough. Barium swallow in June showed abnormality in distal esophagus. Patient denies heartburn, regurgitation, throat clearing . No SOB.  No dysphagia or unexplained weight loss. Patient states she has to eat small meals or will vomit and this has been a chronic, stable problem . She takes Prilosec in am and Zantac 150mg  BID.  Past Medical History:  Diagnosis Date  . Allergic rhinitis   . Allergy    uses Flonase daily as needed  . Anxiety    takes Xanax daily as needed  . Arthritis    "back" (04/21/2013)  . Chronic lower back pain   . Constipation    takes an OTC stool softener  . Depression    takes Zoloft daily  . Dry eyes    uses eye drops  . Exertional shortness of breath   . GERD (gastroesophageal reflux disease)    takes Omeprazole daily and Protonix daily as needed  . H/O hiatal hernia   . Headache(784.0)    sinus  . Heart murmur    years ago  . History of bronchitis    last time many yrs ago  . Hypertension    takes Losartan and Cardizem daily  . Hypothyroidism    takes Synthroid daily  . Joint pain   . Joint swelling   . Nocturia   . Osteoporosis   . Pressure in chest 08/23/13   fell and started having chest pressure, will have ECHO and stress test 09/04/13 before surgery  . Ringing in ears    sees Dr.Byers for this  . Scoliosis   . Urinary frequency   . Urinary incontinence   . Urinary urgency      Past Surgical History:  Procedure Laterality Date  . CARDIAC CATHETERIZATION  2005  . CATARACT EXTRACTION W/ INTRAOCULAR LENS  IMPLANT, BILATERAL Bilateral   . COLONOSCOPY    . ESOPHAGOGASTRODUODENOSCOPY    . EXCISIONAL TOTAL KNEE ARTHROPLASTY Left 09/08/2013   Procedure: EXCISIONAL TOTAL KNEE ARTHROPLASTY POLYEXCHANGE;  Surgeon: Vickey Huger, MD;  Location: [place];  Service: Orthopedics;  Laterality: Left;  . EYE SURGERY    . JOINT  REPLACEMENT    . KNEE ARTHROSCOPY Right   . NASAL SINUS SURGERY     x 4  . SHOULDER ARTHROSCOPY W/ ROTATOR CUFF REPAIR Right   . TONSILLECTOMY  1940's  . TOTAL KNEE ARTHROPLASTY Left 04/21/2013  . TOTAL KNEE ARTHROPLASTY Left 04/21/2013   Procedure: TOTAL KNEE ARTHROPLASTY;  Surgeon: Vickey Huger, MD;  Location: [place];  Service: Orthopedics;  Laterality: Left;  . TUBAL LIGATION     Family History  Problem Relation Age of Onset  . Heart failure Mother   . Heart failure Father   . Hypertension Sister   . Neuropathy Neg Hx    Social History  Substance Use Topics  . Smoking status: Former Smoker    Packs/day: 1.00    Years: 10.00    Types: Cigarettes    Quit date: 03/30/1991  . Smokeless tobacco: Never Used     Comment: quit smoking in Mar 1993  . Alcohol use No   Current Outpatient Prescriptions  Medication Sig Dispense Refill  . albuterol (PROVENTIL HFA;VENTOLIN HFA) 108 (90 BASE) MCG/ACT inhaler Inhale 2 puffs into the lungs every 6 (six) hours as needed for wheezing or shortness of breath. 1 Inhaler 3  . albuterol (PROVENTIL) (  2.5 MG/3ML) 0.083% nebulizer solution Take 3 mLs (2.5 mg total) by nebulization every 6 (six) hours as needed for wheezing or shortness of breath. 150 mL 1  . amLODipine (NORVASC) 5 MG tablet TAKE ONE TABLET BY MOUTH ONCE DAILY 30 tablet 5  . budesonide-formoterol (SYMBICORT) 160-4.5 MCG/ACT inhaler Inhale 2 puffs into the lungs 3 times/day as needed-between meals & bedtime.    . Calcium-Vitamin D (CALTRATE 600 PLUS-VIT D PO) Take 1 tablet by mouth daily.    . furosemide (LASIX) 20 MG tablet TAKE ONE TABLET BY MOUTH ONCE DAILY 30 tablet 11  . levothyroxine (SYNTHROID, LEVOTHROID) 125 MCG tablet TAKE ONE TABLET BY MOUTH ONCE DAILY 90 tablet 3  . losartan (COZAAR) 100 MG tablet TAKE ONE TABLET BY MOUTH ONCE DAILY 90 tablet 0  . Omega-3 Fatty Acids (FISH OIL PO) Take 1 capsule by mouth daily.    Marland Kitchen omeprazole (PRILOSEC) 20 MG capsule Take 1 capsule (20 mg total)  by mouth daily. 90 capsule 3  . ranitidine (ZANTAC) 150 MG tablet Take 1 tablet (150 mg total) by mouth 2 (two) times daily. 60 tablet 3  . sertraline (ZOLOFT) 100 MG tablet TAKE ONE TABLET BY MOUTH ONCE DAILY 30 tablet 5  . traMADol (ULTRAM) 50 MG tablet Take 1 tablet (50 mg total) by mouth every 8 (eight) hours as needed. 90 tablet 0   No current facility-administered medications for this visit.    No Known Allergies   Review of Systems: Positive for allergy/ sinus problems, anxiety, arthritis, back pain, cough and depression. All other systems reviewed and negative except where noted in HPI.   Physical Exam: BP 124/70   Pulse 81   Ht 5' (1.524 m)   Wt 158 lb (71.7 kg)   BMI 30.86 kg/m  Constitutional: Pleasant,well-developed, white female in no acute distress. HEENT: Normocephalic and atraumatic. Conjunctivae are normal. No scleral icterus. Neck supple.  Cardiovascular: Normal rate, regular rhythm.  Pulmonary/chest: Effort normal and breath sounds normal. No wheezing, rales or rhonchi. Abdominal: Soft, nondistended, nontender. Bowel sounds active throughout. There are no masses palpable. No hepatomegaly. Extremities: no edema Lymphadenopathy: No cervical adenopathy noted. Neurological: Alert and oriented to person place and time. Skin: Skin is warm and dry. No rashes noted. Psychiatric: Normal mood and affect. Behavior is normal.   ASSESSMENT AND PLAN:  42. 81 year old female referred by Pulmonary for abnormal barium swallow revealing diffuse granularity of esophageal mucosa consistent with GERD. Other findings included a large hiatal hernia, reflux, ,mild to moderate dysmotility and cricopharyngeus muscle dysfunction. There was no laryngeal penetration or tracheobronchial aspiration.  -Discussed findings with patient. She understands that finding probably represents distal esophagitis but other things such as neoplasm can't be excluded with endoscopy.. She feels well, no  dysphagia or weight loss. Patient doesn't feel she has underlying esophageal cancer and would rather not pursue EGD at this point.. Patient doesn't overeat as this always leads to vomiting. She hasn't had any change in these symptoms for years. Weight is stable.   2. CRC screening. States she is up to date on colonoscopy which was done by another GI group ( ? [facility])  Cc: Tera Partridge, MD

## 2015-11-08 NOTE — Progress Notes (Signed)
I told her that findings could represent cancer and we could biopsy area on EGD. She did not want the EGD. Thanks

## 2015-11-08 NOTE — Progress Notes (Signed)
Agree with assessment and plan as outlined. Patient declined an EGD although I think an EGD is appropriate for this patient to ensure no Barrett's or underlying mucosal abnormality given longstanding reflux and tobacco history. She otherwise was noted to have some evidence of dysmotility and large hiatal hernia.

## 2015-11-10 ENCOUNTER — Other Ambulatory Visit: Payer: Self-pay | Admitting: Internal Medicine

## 2015-11-30 ENCOUNTER — Ambulatory Visit (INDEPENDENT_AMBULATORY_CARE_PROVIDER_SITE_OTHER): Payer: Medicare Other | Admitting: Internal Medicine

## 2015-11-30 ENCOUNTER — Encounter: Payer: Self-pay | Admitting: Internal Medicine

## 2015-11-30 VITALS — BP 152/88 | HR 88 | Temp 98.9°F | Ht 60.0 in | Wt 158.0 lb

## 2015-11-30 DIAGNOSIS — S51811A Laceration without foreign body of right forearm, initial encounter: Secondary | ICD-10-CM

## 2015-11-30 DIAGNOSIS — J209 Acute bronchitis, unspecified: Secondary | ICD-10-CM | POA: Diagnosis not present

## 2015-11-30 DIAGNOSIS — M25562 Pain in left knee: Secondary | ICD-10-CM | POA: Diagnosis not present

## 2015-11-30 DIAGNOSIS — F329 Major depressive disorder, single episode, unspecified: Secondary | ICD-10-CM | POA: Diagnosis not present

## 2015-11-30 DIAGNOSIS — F32A Depression, unspecified: Secondary | ICD-10-CM

## 2015-11-30 DIAGNOSIS — I10 Essential (primary) hypertension: Secondary | ICD-10-CM

## 2015-11-30 DIAGNOSIS — J0100 Acute maxillary sinusitis, unspecified: Secondary | ICD-10-CM

## 2015-11-30 DIAGNOSIS — Z8709 Personal history of other diseases of the respiratory system: Secondary | ICD-10-CM | POA: Diagnosis not present

## 2015-11-30 DIAGNOSIS — N183 Chronic kidney disease, stage 3 unspecified: Secondary | ICD-10-CM

## 2015-11-30 DIAGNOSIS — K219 Gastro-esophageal reflux disease without esophagitis: Secondary | ICD-10-CM | POA: Diagnosis not present

## 2015-11-30 DIAGNOSIS — M542 Cervicalgia: Secondary | ICD-10-CM | POA: Diagnosis not present

## 2015-11-30 DIAGNOSIS — G5603 Carpal tunnel syndrome, bilateral upper limbs: Secondary | ICD-10-CM

## 2015-11-30 DIAGNOSIS — M25561 Pain in right knee: Secondary | ICD-10-CM

## 2015-11-30 MED ORDER — MUPIROCIN 2 % EX OINT: 1.0000 "application " | TOPICAL_OINTMENT | Freq: Two times a day (BID) | CUTANEOUS | 0 refills | Status: DC

## 2015-11-30 MED ORDER — LEVOFLOXACIN 500 MG PO TABS: 500.0000 mg | ORAL_TABLET | Freq: Every day | ORAL | 0 refills | Status: DC

## 2015-11-30 MED ORDER — CEFTRIAXONE SODIUM 1 G IJ SOLR
1.0000 g | Freq: Once | INTRAMUSCULAR | Status: AC
  Administered 2015-11-30: 1 g via INTRAMUSCULAR

## 2015-11-30 MED ORDER — HYDROCODONE-HOMATROPINE 5-1.5 MG/5ML PO SYRP: 5.0000 mL | ORAL_SOLUTION | Freq: Three times a day (TID) | ORAL | 0 refills | Status: DC | PRN

## 2015-11-30 MED ORDER — PREDNISONE 10 MG PO TABS: ORAL_TABLET | ORAL | 0 refills | Status: DC

## 2015-11-30 NOTE — Progress Notes (Signed)
   Subjective:    Patient ID: Terri Bridges, female    DOB: 11-13-1934, 81 y.o.   MRN: [id number]  HPI 81 year old Female in today with acute sinusitis. Long-standing history of recurrent and acute sinusitis issues. Has had a lot of nasal congestion and discomfort in the maxillary sinus areas. She also struck her right arm recently and suffered a skin tear right forearm.  History of anxiety depression, COPD, chronic kidney disease, hypothyroidism, hypertension, carpal tunnel syndrome, GE reflux osteoporosis and osteoarthritis of knees. She was scheduled to have another knee surgery-right knee arthroplasty by Dr. French Ana in May but has been undergoing evaluations for COPD, hand numbness and chronic cough. She saw neurologist who diagnosed her with osteoarthritis of the spine with multilevel degenerative changes but no nerve root impingement. The osteoarthritis and degenerative changes were likely causing her neck pain. She was offered facet blocks but declined. She also was complaining of vertigo and it was suggested that she have vestibular therapy for dizziness but she declined.    Review of Systems see above     Objective:   Physical Exam She looks fatigued and is chronically depressed. Pharynx is clear. TMs are clear. Has congested cough. Neck is supple without adenopathy. Chest clear to auscultation without rales or wheezing.       Assessment & Plan:  Acute sinusitis  Acute bronchitis  Skin tear right forearm    Plan: Sterapred DS 10 mg 6 day dosepak. Bactroban to skin tear twice daily. Hycodan 1 teaspoon by mouth every 8 hours when necessary cough. 1 g IM Rocephin given in office today. Levaquin 500 milligrams daily for 10 days.

## 2015-12-05 NOTE — Patient Instructions (Signed)
1 g IM Rocephin given in office. Take prednisone in tapering course as directed. Hycodan as needed every 8 hours for cough. Levaquin 500 milligrams daily for 10 days. Bactroban ointment to skin tear twice daily. Rest and drink plenty of fluids.

## 2016-01-11 DIAGNOSIS — H1033 Unspecified acute conjunctivitis, bilateral: Secondary | ICD-10-CM | POA: Diagnosis not present

## 2016-01-20 ENCOUNTER — Other Ambulatory Visit: Payer: Medicare Other | Admitting: Internal Medicine

## 2016-01-20 ENCOUNTER — Encounter: Payer: Self-pay | Admitting: Internal Medicine

## 2016-01-20 ENCOUNTER — Ambulatory Visit (INDEPENDENT_AMBULATORY_CARE_PROVIDER_SITE_OTHER): Payer: Medicare Other | Admitting: Internal Medicine

## 2016-01-20 VITALS — BP 142/80 | HR 68 | Temp 98.9°F | Ht 60.0 in | Wt 155.0 lb

## 2016-01-20 DIAGNOSIS — J209 Acute bronchitis, unspecified: Secondary | ICD-10-CM

## 2016-01-20 DIAGNOSIS — E039 Hypothyroidism, unspecified: Secondary | ICD-10-CM | POA: Diagnosis not present

## 2016-01-20 DIAGNOSIS — Z0000 Encounter for general adult medical examination without abnormal findings: Secondary | ICD-10-CM

## 2016-01-20 DIAGNOSIS — I10 Essential (primary) hypertension: Secondary | ICD-10-CM

## 2016-01-20 DIAGNOSIS — Z8709 Personal history of other diseases of the respiratory system: Secondary | ICD-10-CM | POA: Diagnosis not present

## 2016-01-20 LAB — CBC WITH DIFFERENTIAL/PLATELET
BASOS ABS: 0 {cells}/uL (ref 0–200)
Basophils Relative: 0 %
EOS ABS: 136 {cells}/uL (ref 15–500)
Eosinophils Relative: 4 %
HEMATOCRIT: 40.3 % (ref 35.0–45.0)
HEMOGLOBIN: 13.2 g/dL (ref 11.7–15.5)
LYMPHS ABS: 850 {cells}/uL (ref 850–3900)
Lymphocytes Relative: 25 %
MCH: 27.2 pg (ref 27.0–33.0)
MCHC: 32.8 g/dL (ref 32.0–36.0)
MCV: 83.1 fL (ref 80.0–100.0)
MONO ABS: 510 {cells}/uL (ref 200–950)
MPV: 10.3 fL (ref 7.5–12.5)
Monocytes Relative: 15 %
NEUTROS PCT: 56 %
Neutro Abs: 1904 cells/uL (ref 1500–7800)
Platelets: 125 10*3/uL — ABNORMAL LOW (ref 140–400)
RBC: 4.85 MIL/uL (ref 3.80–5.10)
RDW: 14.6 % (ref 11.0–15.0)
WBC: 3.4 10*3/uL — AB (ref 3.8–10.8)

## 2016-01-20 LAB — COMPLETE METABOLIC PANEL WITH GFR
ALBUMIN: 3.7 g/dL (ref 3.6–5.1)
ALK PHOS: 67 U/L (ref 33–130)
ALT: 13 U/L (ref 6–29)
AST: 21 U/L (ref 10–35)
BUN: 15 mg/dL (ref 7–25)
CALCIUM: 8.7 mg/dL (ref 8.6–10.4)
CO2: 27 mmol/L (ref 20–31)
CREATININE: 1.28 mg/dL — AB (ref 0.60–0.93)
Chloride: 105 mmol/L (ref 98–110)
GFR, Est African American: 46 mL/min — ABNORMAL LOW (ref >=60)
GFR, Est Non African American: 40 mL/min — ABNORMAL LOW (ref >=60)
Glucose, Bld: 81 mg/dL (ref 65–99)
POTASSIUM: 3.9 mmol/L (ref 3.5–5.3)
Sodium: 141 mmol/L (ref 135–146)
Total Bilirubin: 0.6 mg/dL (ref 0.2–1.2)
Total Protein: 6.2 g/dL (ref 6.1–8.1)

## 2016-01-20 LAB — LIPID PANEL
Cholesterol: 171 mg/dL (ref <200)
HDL: 65 mg/dL (ref >50)
LDL Cholesterol: 93 mg/dL (ref <100)
Total CHOL/HDL Ratio: 2.6 Ratio (ref <5.0)
Triglycerides: 64 mg/dL (ref <150)
VLDL: 13 mg/dL (ref <30)

## 2016-01-20 LAB — TSH: TSH: 0.23 m[IU]/L — AB

## 2016-01-20 MED ORDER — LEVOFLOXACIN 500 MG PO TABS: 500.0000 mg | ORAL_TABLET | Freq: Every day | ORAL | 0 refills | Status: DC

## 2016-01-20 MED ORDER — CEFTRIAXONE SODIUM 1 G IJ SOLR
1.0000 g | Freq: Once | INTRAMUSCULAR | Status: AC
  Administered 2016-01-20: 1 g via INTRAMUSCULAR

## 2016-01-20 MED ORDER — PREDNISONE 10 MG PO TABS: ORAL_TABLET | ORAL | 0 refills | Status: DC

## 2016-01-20 MED ORDER — HYDROCODONE-HOMATROPINE 5-1.5 MG/5ML PO SYRP: 5.0000 mL | ORAL_SOLUTION | Freq: Three times a day (TID) | ORAL | 0 refills | Status: DC | PRN

## 2016-01-20 NOTE — Progress Notes (Signed)
   Subjective:    Patient ID: Terri Bridges, female    DOB: 1934/11/19, 82 y.o.   MRN: [id number]  HPI  Onset some 10 days ago during very cold weather of cough and congestion. Has had discolored sputum production. Some shortness of breath. Has home nebulizer which she uses regularly and Symbicort inhaler. Has upcoming physical exam next week but did not feel she could wait until then for treatment. No fever or shaking chills. Has malaise and fatigue. Chronically depressed.    Review of Systems see above     Objective:   Physical Exam Skin warm and dry. Nodes none. Pharynx slightly injected. TMs chronically scarred. Neck is supple. Chest clear to auscultation without rales or wheezing. Has deep congested cough.       Assessment & Plan:  Acute bronchitis  COPD  Plan: Rocephin 1 g IM given in office. Levaquin 500 milligrams daily for 10 days. Sterapred DS 10 mg 6 day dosepak. Use Symbicort inhaler 2 sprays by mouth every 12 hours. Use home nebulizer 4 times daily. Hycodan 1 teaspoon by mouth every 8 hours when necessary cough. Follow-up next week at time of physical exam.

## 2016-01-20 NOTE — Patient Instructions (Signed)
Symbicort 2 sprays by mouth every 12 hours. Use home nebulizer. 1 g IM Rocephin given in office. Levaquin 500 milligrams daily for 10 days. Sterapred DS 10 mg 6 day Dosepak.

## 2016-01-24 ENCOUNTER — Encounter: Payer: Self-pay | Admitting: Internal Medicine

## 2016-01-24 ENCOUNTER — Ambulatory Visit (INDEPENDENT_AMBULATORY_CARE_PROVIDER_SITE_OTHER): Payer: Medicare Other | Admitting: Internal Medicine

## 2016-01-24 VITALS — BP 130/76 | HR 92 | Ht 59.0 in | Wt 154.0 lb

## 2016-01-24 DIAGNOSIS — I10 Essential (primary) hypertension: Secondary | ICD-10-CM | POA: Diagnosis not present

## 2016-01-24 DIAGNOSIS — Z8709 Personal history of other diseases of the respiratory system: Secondary | ICD-10-CM | POA: Diagnosis not present

## 2016-01-24 DIAGNOSIS — Z0000 Encounter for general adult medical examination without abnormal findings: Secondary | ICD-10-CM

## 2016-01-24 DIAGNOSIS — N183 Chronic kidney disease, stage 3 unspecified: Secondary | ICD-10-CM

## 2016-01-24 DIAGNOSIS — K219 Gastro-esophageal reflux disease without esophagitis: Secondary | ICD-10-CM | POA: Diagnosis not present

## 2016-01-24 DIAGNOSIS — F411 Generalized anxiety disorder: Secondary | ICD-10-CM

## 2016-01-24 DIAGNOSIS — E039 Hypothyroidism, unspecified: Secondary | ICD-10-CM

## 2016-01-24 DIAGNOSIS — F32A Depression, unspecified: Secondary | ICD-10-CM

## 2016-01-24 DIAGNOSIS — J209 Acute bronchitis, unspecified: Secondary | ICD-10-CM | POA: Diagnosis not present

## 2016-01-24 DIAGNOSIS — F329 Major depressive disorder, single episode, unspecified: Secondary | ICD-10-CM | POA: Diagnosis not present

## 2016-01-24 LAB — POC URINALSYSI DIPSTICK (AUTOMATED)
Leukocytes, UA: NEGATIVE
PH UA: 6.5
Spec Grav, UA: <=1.005
UROBILINOGEN UA: NEGATIVE

## 2016-01-24 NOTE — Patient Instructions (Signed)
Finish steroids and antibiotics. Return in 3 months for TSH and repeat CBC. Has low white blood cell count and platelet count likely due to viral syndrome. Repeat TSH when not ill.

## 2016-01-24 NOTE — Progress Notes (Signed)
Subjective:    Patient ID: Terri Bridges, female    DOB: 1934-08-27, 82 y.o.   MRN: [id number]  HPI 82 year old female in today for health maintenance exam and evaluation of medical issues. Was just seen here last week with asthmatic bronchitis coughing and wheezing. Has also been depressed. She found a neighbor at home in distress  who subsequently died. She's not been taking Zoloft. She needs to start it back. Having difficulty living on [employer]. Asked her to contact [address] or social services for some assistance.  In the Spring of 2015 she had a left total knee arthroplasty by Dr. Lorre Nick. 4 months later she had revision of total left knee arthroplasty due to instability with polyethylene exchange. She's done well from that.  Was scheduled to have a right knee replacement in the near future but has postponed that.  Additional problems include hypertension, hypothyroidism, allergic rhinitis, GE reflux, recurrent sinusitis, anxiety depression COPD and osteoporosis.  For details of sinus surgery done in 1991 by Dr. Purcell Nails please see dictation 01/21/2015. She had a periorbital abscess drained with right intranasal ethmoidectomy March 1993. Left ethmoidectomy left splenoid ectomy transnasal frontal sinusotomy 04/07/1991.  Arthroscopic surgery right knee February 2004. Bilateral grommet tubes placed February 2004. Left cataract extraction January 2013. Right rotator cuff repair 2011. She had colonoscopy done by Dr. Oletta Lamas 2008. She had upper GI endoscopy at the same time showing a hiatal hernia.  History of asthma and allergic rhinitis treated by Dr. Neldon Mc.  Had exercise tolerance test to evaluate chest pain and Dr. Doreatha Lew November 1990. Study was negative. Dr. Roselyn Reef saw her in 2005 for palpitations. He placed her on Cardizem with improvement in blood pressure and palpitations. She had a cardiac cath at that time showing no significant coronary artery disease.  Patient had  osteoporosis screening February 2013 at [facility] with T score in the left femoral neck -2.5 and T score in lumbar spine being -2.5. Because of GE reflux it was not likely she would tolerate Fosamax. She take Actonel for a while in 2002 but it was discontinued.  She has difficulty a 40 many of her medications and we have given her many samples.  She is a former heavy smoker having smoked 1-2 packs of cigarettes daily for some 10 years. She quit smoking in 1993.  Social history: She resides with her granddaughter who has a leg deformity and has had multiple surgeries at [facility] in [place]. She is divorced. 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 Prior to that she worked at [employer]. 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 She does not consume alcohol. Her graft in the summer 2014, her son suffered an arrest in her backyard and subsequently expired presumably of an MI. He had prior history of drug abuse.  Family history: Father died at age 53 of an MI. Mother died at age 56 been MI.    Review of Systems chronic anxiety depression, malaise and fatigue     Objective:   Physical Exam  Constitutional: She is oriented to person, place, and time. She appears well-developed and well-nourished. She appears distressed.  HENT:  Head: Normocephalic and atraumatic.  Right Ear: External ear normal.  Left Ear: External ear normal.  Mouth/Throat: Oropharynx is clear and moist.  Eyes: Conjunctivae and EOM are normal. Pupils are equal, round, and reactive to light.  Neck: No  JVD present. No thyromegaly present.  Cardiovascular: Normal rate and normal heart sounds.   No murmur heard. Pulmonary/Chest: No respiratory distress. She has no wheezes. She has no rales.  Breasts normal female  Abdominal: She exhibits no distension and no mass. There is no tenderness. There is no  rebound and no guarding.  Genitourinary:  Genitourinary Comments: Deferred  Musculoskeletal: She exhibits no edema.  Lymphadenopathy:    She has no cervical adenopathy.  Neurological: She is alert and oriented to person, place, and time. She has normal reflexes. No cranial nerve deficit. Coordination normal.  Skin: Skin is warm and dry. No rash noted.  Psychiatric:  Depressed and crying  Vitals reviewed.         Assessment & Plan:  Anxiety and depression  Situational stress  History of recurrent sinus infections  Allergic rhinitis  COPD  Hypothyroidism  Essential hypertension  Osteoarthritis  Right pain  Elevated serum creatinine likely has chronic kidney disease  Hyperlipidemia  Plan: Six-day prednisone 10 mg taper. Levaquin 500 milligrams daily for 10 days. Use albuterol inhaler. Hycodan 1 teaspoon by mouth every 8 hours when necessary cough. Continue Cozaar for hypertension and thyroid replacement. Omeprazole for GE reflux. Zoloft for depression. Return in 6 months.  She is to return in 3 months for TSH and repeat CBC. She has a low white blood cell count and platelet plan likely due to viral syndrome. TSH should be repeated when she is not deal with respiratory infection. Therefore plan to see her in 3 months for the studies  Subjective:   Patient presents for Medicare Annual/Subsequent preventive examination.  Review Past Medical/Family/Social: See above   Risk Factors  Current exercise habits: Mostly sedentary Dietary issues discussed: Discussed low-fat low carbohydrate diet  Cardiac risk factors:Family history, history of smoking, hyperlipidemia  Depression Screen  (Note: if answer to either of the following is "Yes", a more complete depression screening is indicated)   Over the past two weeks, have you felt down, depressed or hopeless? yes Over the past two weeks, have you felt little interest or pleasure in doing things?yes Have you lost interest  or pleasure in daily life? yes Do you often feel hopeless?yes Do you cry easily over simple problems? yes  Activities of Daily Living  In your present state of health, do you have any difficulty performing the following activities?:   Driving? No  Managing money? No  Feeding yourself? No  Getting from bed to chair? No  Climbing a flight of stairs? No  Preparing food and eating?: No  Bathing or showering? No  Getting dressed: No  Getting to the toilet? No  Using the toilet:No  Moving around from place to place: No  In the past year have you fallen or had a near fall?:yes  Are you sexually active? No  Do you have more than one partner? No   Hearing Difficulties: yes Do you often ask people to speak up or repeat themselves?yes Do you experience ringing or noises in your ears? yes Do you have difficulty understanding soft or whispered voices? yes Do you feel that you have a problem with memory? No Do you often misplace items? yes   Home Safety:  Do you have a smoke alarm at your residence? Yes Do you have grab bars in the bathroom?no Do you have throw rugs in your house?no   Cognitive Testing  Alert? Yes Normal Appearance?Yes  Oriented to person? Yes Place? Yes  Time? Yes  Recall of three  objects? Yes  Can perform simple calculations? Yes  Displays appropriate judgment?Yes  Can read the correct time from a watch face?Yes   List the Names of Other Physician/Practitioners you currently use:  See referral list for the physicians patient is currently seeing.     Review of Systems: See above   Objective:     General appearance: Appears stated age and mildly obese  Head: Normocephalic, without obvious abnormality, atraumatic  Eyes: conj clear, EOMi PEERLA  Ears: normal TM's and external ear canals both ears  Nose: Nares normal. Septum midline. Mucosa normal. No drainage or sinus tenderness.  Throat: lips, mucosa, and tongue normal; teeth and gums normal  Neck: no  adenopathy, no carotid bruit, no JVD, supple, symmetrical, trachea midline and thyroid not enlarged, symmetric, no tenderness/mass/nodules  No CVA tenderness.  Lungs: clear to auscultation bilaterally  Breasts: normal appearance, no masses or tenderness,  Heart: regular rate and rhythm, S1, S2 normal, no murmur, click, rub or gallop  Abdomen: soft, non-tender; bowel sounds normal; no masses, no organomegaly  Musculoskeletal: ROM normal in all joints, no crepitus, no deformity, Normal muscle strengthen. Back  is symmetric, no curvature. Skin: Skin color, texture, turgor normal. No rashes or lesions  Lymph nodes: Cervical, supraclavicular, and axillary nodes normal.  Neurologic: CN 2 -12 Normal, Normal symmetric reflexes. Normal coordination and gait  Psych: Alert & Oriented x 3, Mood appear stable.    Assessment:    Annual wellness medicare exam   Plan:    During the course of the visit the patient was educated and counseled about appropriate screening and preventive services including:    Recommend annual flu vaccine and annual mammogram    Patient Instructions (the written plan) was given to the patient.  Medicare Attestation  I have personally reviewed:  The patient's medical and social history  Their use of alcohol, tobacco or illicit drugs  Their current medications and supplements  The patient's functional ability including ADLs,fall risks, home safety risks, cognitive, and hearing and visual impairment  Diet and physical activities  Evidence for depression or mood disorders  The patient's weight, height, BMI, and visual acuity have been recorded in the chart. I have made referrals, counseling, and provided education to the patient based on review of the above and I have provided the patient with a written personalized care plan for preventive services.

## 2016-01-27 ENCOUNTER — Inpatient Hospital Stay: Admission: RE | Admit: 2016-01-27 | Payer: Medicare Other | Source: Ambulatory Visit

## 2016-01-31 ENCOUNTER — Ambulatory Visit (INDEPENDENT_AMBULATORY_CARE_PROVIDER_SITE_OTHER)
Admission: RE | Admit: 2016-01-31 | Discharge: 2016-01-31 | Disposition: A | Discharge status: Home / Self Care | Payer: Medicare Other | Source: Ambulatory Visit | Attending: Pulmonary Disease | Admitting: Pulmonary Disease

## 2016-01-31 DIAGNOSIS — J929 Pleural plaque without asbestos: Secondary | ICD-10-CM | POA: Diagnosis not present

## 2016-01-31 DIAGNOSIS — R05 Cough: Secondary | ICD-10-CM | POA: Diagnosis not present

## 2016-01-31 DIAGNOSIS — R059 Cough, unspecified: Secondary | ICD-10-CM

## 2016-02-02 ENCOUNTER — Other Ambulatory Visit: Payer: Self-pay | Admitting: Internal Medicine

## 2016-02-02 NOTE — Progress Notes (Signed)
Spoke with patient about CT results. Pt did not have any additional questions. Nothing further needed.

## 2016-02-04 ENCOUNTER — Other Ambulatory Visit: Payer: Self-pay | Admitting: Internal Medicine

## 2016-02-10 DIAGNOSIS — H524 Presbyopia: Secondary | ICD-10-CM | POA: Diagnosis not present

## 2016-02-10 DIAGNOSIS — H10413 Chronic giant papillary conjunctivitis, bilateral: Secondary | ICD-10-CM | POA: Diagnosis not present

## 2016-02-10 DIAGNOSIS — H2513 Age-related nuclear cataract, bilateral: Secondary | ICD-10-CM | POA: Diagnosis not present

## 2016-02-14 ENCOUNTER — Ambulatory Visit (INDEPENDENT_AMBULATORY_CARE_PROVIDER_SITE_OTHER): Payer: Medicare Other | Admitting: Pulmonary Disease

## 2016-02-14 ENCOUNTER — Other Ambulatory Visit: Payer: Self-pay | Admitting: Internal Medicine

## 2016-02-14 ENCOUNTER — Encounter: Payer: Self-pay | Admitting: Pulmonary Disease

## 2016-02-14 VITALS — BP 112/78 | HR 90 | Ht 59.0 in | Wt 151.0 lb

## 2016-02-14 DIAGNOSIS — R059 Cough, unspecified: Secondary | ICD-10-CM

## 2016-02-14 DIAGNOSIS — R05 Cough: Secondary | ICD-10-CM | POA: Diagnosis not present

## 2016-02-14 DIAGNOSIS — K449 Diaphragmatic hernia without obstruction or gangrene: Secondary | ICD-10-CM

## 2016-02-14 DIAGNOSIS — K2100 Gastro-esophageal reflux disease with esophagitis, without bleeding: Secondary | ICD-10-CM

## 2016-02-14 DIAGNOSIS — R0600 Dyspnea, unspecified: Secondary | ICD-10-CM

## 2016-02-14 DIAGNOSIS — J309 Allergic rhinitis, unspecified: Secondary | ICD-10-CM

## 2016-02-14 NOTE — Progress Notes (Signed)
Subjective:    Patient ID: Terri Bridges, female    DOB: [birth year], 82 y.o.   MRN: [id number]  C.C.:  Follow-up for Cough, Dyspnea, GERD w/ Hiatal Hernia, & Chronic Allergic Rhinitis.  HPI Cough/Dyspnea:  She reports she does have intermittent dyspnea. She did have an episode of "bronchitis" in January. She She reports only intermittent coughing. She does cough during the night as well. She does feel her albuterol seems to help her symptoms.   GERD w/ Hiatal Hernia: Prescribed Prilosec and Zantac twice a day. Esophagram did show esophagitis. Evaluated by GI since last appointment in October 2017 and deferred EGD at that time. Denies any reflux or dyspepsia on her regimen. She has no morning brash water taste. No dysphagia or odynophagia. She is eating small meals.   Chronic Allergic Rhinitis:Patient was previously using over-the-counter antihistamine as well as nasal saline rinse. Reports minimal sinus congestion or pressure at times time.   Review of Systems She does report some fatigue. No fever, chills, or sweats. No chest pain or tightness. She does have pressure at times. No rashes that she has noticed. She does bruise easily.   No Known Allergies  Current Outpatient Prescriptions on File Prior to Visit  Medication Sig Dispense Refill  . albuterol (PROVENTIL HFA;VENTOLIN HFA) 108 (90 BASE) MCG/ACT inhaler Inhale 2 puffs into the lungs every 6 (six) hours as needed for wheezing or shortness of breath. 1 Inhaler 3  . albuterol (PROVENTIL) (2.5 MG/3ML) 0.083% nebulizer solution Take 3 mLs (2.5 mg total) by nebulization every 6 (six) hours as needed for wheezing or shortness of breath. 150 mL 1  . amLODipine (NORVASC) 5 MG tablet TAKE ONE TABLET BY MOUTH ONCE DAILY 30 tablet 5  . budesonide-formoterol (SYMBICORT) 160-4.5 MCG/ACT inhaler Inhale 2 puffs into the lungs 3 times/day as needed-between meals & bedtime.    . Calcium-Vitamin D (CALTRATE 600 PLUS-VIT D PO) Take 1 tablet by mouth  daily.    . furosemide (LASIX) 20 MG tablet TAKE ONE TABLET BY MOUTH ONCE DAILY 30 tablet 11  . HYDROcodone-homatropine (HYCODAN) 5-1.5 MG/5ML syrup Take 5 mLs by mouth every 8 (eight) hours as needed for cough. 120 mL 0  . levothyroxine (SYNTHROID, LEVOTHROID) 125 MCG tablet TAKE ONE TABLET BY MOUTH ONCE DAILY 90 tablet 3  . losartan (COZAAR) 100 MG tablet TAKE ONE TABLET BY MOUTH ONCE DAILY 90 tablet 0  . mupirocin ointment (BACTROBAN) 2 % Place 1 application into the nose 2 (two) times daily. 22 g 0  . Omega-3 Fatty Acids (FISH OIL PO) Take 1 capsule by mouth daily.    Marland Kitchen omeprazole (PRILOSEC) 20 MG capsule Take 1 capsule (20 mg total) by mouth daily. 90 capsule 3  . ranitidine (ZANTAC) 150 MG tablet Take 1 tablet (150 mg total) by mouth 2 (two) times daily. 60 tablet 3  . sertraline (ZOLOFT) 100 MG tablet TAKE ONE TABLET BY MOUTH ONCE DAILY 30 tablet 5  . traMADol (ULTRAM) 50 MG tablet Take 1 tablet (50 mg total) by mouth every 8 (eight) hours as needed. 90 tablet 0  . levofloxacin (LEVAQUIN) 500 MG tablet Take 1 tablet (500 mg total) by mouth daily. (Patient not taking: Reported on 02/14/2016) 10 tablet 0  . predniSONE (DELTASONE) 10 MG tablet Take in tapering course as directed 6-5-4-3-2-1 taper (Patient not taking: Reported on 02/14/2016) 21 tablet 0   No current facility-administered medications on file prior to visit.     Past Medical History:  Diagnosis  Date  . Allergic rhinitis   . Allergy    uses Flonase daily as needed  . Anxiety    takes Xanax daily as needed  . Arthritis    "back" (04/21/2013)  . Chronic lower back pain   . Constipation    takes an OTC stool softener  . Depression    takes Zoloft daily  . Dry eyes    uses eye drops  . Exertional shortness of breath   . GERD (gastroesophageal reflux disease)    takes Omeprazole daily and Protonix daily as needed  . H/O hiatal hernia   . Headache(784.0)    sinus  . Heart murmur    years ago  . History of bronchitis     last time many yrs ago  . Hypertension    takes Losartan and Cardizem daily  . Hypothyroidism    takes Synthroid daily  . Joint pain   . Joint swelling   . Nocturia   . Osteoporosis   . Pressure in chest 08/23/13   fell and started having chest pressure, will have ECHO and stress test 09/04/13 before surgery  . Ringing in ears    sees Dr.Byers for this  . Scoliosis   . Urinary frequency   . Urinary incontinence   . Urinary urgency     Past Surgical History:  Procedure Laterality Date  . CARDIAC CATHETERIZATION  2005  . CATARACT EXTRACTION W/ INTRAOCULAR LENS  IMPLANT, BILATERAL Bilateral   . COLONOSCOPY    . ESOPHAGOGASTRODUODENOSCOPY    . EXCISIONAL TOTAL KNEE ARTHROPLASTY Left 09/08/2013   Procedure: EXCISIONAL TOTAL KNEE ARTHROPLASTY POLYEXCHANGE;  Surgeon: Vickey Huger, MD;  Location: [facility];  Service: Orthopedics;  Laterality: Left;  . EYE SURGERY    . JOINT REPLACEMENT    . KNEE ARTHROSCOPY Right   . NASAL SINUS SURGERY     x 4  . SHOULDER ARTHROSCOPY W/ ROTATOR CUFF REPAIR Right   . TONSILLECTOMY  1940's  . TOTAL KNEE ARTHROPLASTY Left 04/21/2013  . TOTAL KNEE ARTHROPLASTY Left 04/21/2013   Procedure: TOTAL KNEE ARTHROPLASTY;  Surgeon: Vickey Huger, MD;  Location: [facility];  Service: Orthopedics;  Laterality: Left;  . TUBAL LIGATION      Family History  Problem Relation Age of Onset  . Heart failure Mother   . Heart failure Father   . Hypertension Sister   . Neuropathy Neg Hx     Social History   Social History  . Marital status: Single    Spouse name: N/A  . Number of children: 6  . Years of education: 29   Occupational History  . Retired Orthoptist    also caregiver   Social History Main Topics  . Smoking status: Former Smoker    Packs/day: 1.00    Years: 10.00    Types: Cigarettes    Quit date: 03/30/1991  . Smokeless tobacco: Never Used     Comment: quit smoking in Mar 1993  . Alcohol use No  . Drug use: No  . Sexual activity: No   Other Topics  Concern  . None   Social History Narrative   Originally from [place].    Previously worked in Engineer, mining for [employer]    Has 2 dogs currently.    Remote exposure to parrots in her current home.    No known mold exposure.    Remote travel to [place].    Has 1 indoor plant.    Caffeine use: No soda  2 cups coffee every morning      Objective:   Physical Exam BP 112/78 (BP Location: Right Arm, Patient Position: Sitting, Cuff Size: Normal)   Pulse 90   Ht 4\' 11"  (1.499 m)   Wt 151 lb (68.5 kg) Comment: per pt  SpO2 94%   BMI 30.50 kg/m   General:  Awake. Alert. No acute distress. Elderly female. Integument:  Warm & dry. No rash on exposed skin.  Lymphatics: No appreciated cervical or supraclavicular lymphadenopathy. Extremities:  No cyanosis or clubbing.  HEENT:  Moist mucus membranes. No nasal turbinate swelling. No oral ulcers. No scleral injection or icterus. Cardiovascular:  Regular rate. No edema. No appreciable JVD.  Pulmonary:  Clear with auscultation. Speaking in complete sentences. No accessoryuse. Abdomen: Soft. Normal bowel sounds. Nondistended. Musculoskeletal:  Normal bulk and tone. No joint deformity or effusion appreciated. Marked kyphosis noted. Mild synovial thickening of bilateral DIP joints.   PFT 07/29/15: FVC 2.15 L (98%) FEV1 1.57 L (97%) FEV1/FVC 0.73 FEF 25-75 1.07 L (86%) no bronchodilator response TLC 4.50 L (101%) RV 112% ERV 83% DLCO uncorrected 89% (hemoglobin 15.5)  6MWT 08/02/15:  Walked 351 meters / Baseline Sat 97% on RA / Nadir Sat 97% on RA (complained of right knee pain & chest heaviness)  IMAGING CT CHEST W/O 01/31/16 (personally reviewed by me): Minimal biapical pleural scarring. No parenchymal nodule or mass appreciated other than calcified granuloma. No pathologic mediastinal adenopathy. Large hiatal hernia noted. No pericardial effusion. No pleural effusion or thickening.  BARIUM SWALLOW 06/18/15 (per radiologist): Moderate to large hiatal hernia  mildly increased in size since 2008. Spontaneous moderate reflux with mild reflux esophagitis. No esophageal ulcer, mass, or stricture detected. Moderate cricopharyngeus muscle dysfunction & mild to moderate esophageal dysmotility characteristic of chronic GERD. Normal oral and pharyngeal phases of swallowing. No laryngeal penetration or tracheal bronchial aspiration.  CXR PA/LAT 05/07/15 (previously reviewed by me): Kyphosis noted on lateral views. Hiatal hernia noted. No parenchymal nodule or opacity appreciated otherwise. Mild hyperinflation with flattening of the diaphragms. Heart normal in size & mediastinum otherwise normal in contour. No pleural effusion appreciated.  CARDIAC TTE (09/04/13): LV EF 55-60%. LA & RA normal in size. RV normal in size and function. No aortic stenosis or regurgitation. Trivial mitral regurgitation without stenosis. No tricuspid regurgitation. No pericardial effusion.  MICROBIOLOGY Sputum Ctx 06/18/15:  Oral Flora / Candida dubliniensis / AFB negative   LABS 06/14/15 IgG: 558 IgE: 10 IgA: 131 IgM: 59 RAST Panel: Negative  06/03/15 BNP: 32.0  05/07/15 CBC: 5.3/13.8/41.7/202 BMP: 140/4.1/99/27/19/1.3/95/9.3 LFT: 4.3/6.7/0.6/75/20/13    Assessment & Plan:  82 y.o. female with cough and dyspnea likely secondary to her underlying reflux and hiatal hernia.  patient's symptoms seem to have improved slightly since last appointment. I am highly suspicious that her ongoing cough is due to reflux but she has declined EGD. I reviewed her chest CT scan with her today which shows no evidence for any parenchymal cause for her cough other than for her large hiatal hernia. Her allergies seem to be reasonably controlled at this time.  1. Cough/Dyspnea: Secondary to her hiatal hernia. No new testing or medications at this time. 2. GERD w/ Hiatal Hernia:  Continuing Prilosec & Zantac. No changes in her regimen at this time.  3. Chronic Allergic Rhinitis:  Continuing  over-the-counter medications. No new medications at this time. 4. Health Maintenance:  S/P  Influenza Vaccine October 2017, Prevnar January 2017, Pneumovax 01 Jun 2003, & Tdap March 2015.  5. Follow-up: Patient to return to clinic  as needed for any new symptoms.   Sonia Baller Ashok Cordia, M.D. [facility] Pager:  [phone] After 3pm or if no response, call [phone] 10:27 AM 02/14/16

## 2016-02-14 NOTE — Patient Instructions (Signed)
   Call me if you have any new breathing problems or questions that I can help you with.  We will leave your next appointment date open-ended at this time.

## 2016-03-01 ENCOUNTER — Other Ambulatory Visit: Payer: Self-pay | Admitting: Internal Medicine

## 2016-03-27 ENCOUNTER — Other Ambulatory Visit: Payer: Self-pay | Admitting: Internal Medicine

## 2016-04-27 ENCOUNTER — Other Ambulatory Visit: Payer: Medicare Other | Admitting: Internal Medicine

## 2016-04-27 DIAGNOSIS — R7989 Other specified abnormal findings of blood chemistry: Secondary | ICD-10-CM

## 2016-04-27 DIAGNOSIS — D696 Thrombocytopenia, unspecified: Secondary | ICD-10-CM | POA: Diagnosis not present

## 2016-04-27 DIAGNOSIS — D72819 Decreased white blood cell count, unspecified: Secondary | ICD-10-CM

## 2016-04-27 DIAGNOSIS — R946 Abnormal results of thyroid function studies: Secondary | ICD-10-CM | POA: Diagnosis not present

## 2016-04-27 LAB — CBC WITH DIFFERENTIAL/PLATELET
Basophils Absolute: 45 cells/uL (ref 0–200)
Basophils Relative: 1 %
EOS PCT: 7 %
Eosinophils Absolute: 315 cells/uL (ref 15–500)
HCT: 40.6 % (ref 35.0–45.0)
HEMOGLOBIN: 13.1 g/dL (ref 11.7–15.5)
LYMPHS ABS: 1215 {cells}/uL (ref 850–3900)
Lymphocytes Relative: 27 %
MCH: 27.2 pg (ref 27.0–33.0)
MCHC: 32.3 g/dL (ref 32.0–36.0)
MCV: 84.4 fL (ref 80.0–100.0)
MONOS PCT: 8 %
MPV: 10.1 fL (ref 7.5–12.5)
Monocytes Absolute: 360 cells/uL (ref 200–950)
NEUTROS ABS: 2565 {cells}/uL (ref 1500–7800)
Neutrophils Relative %: 57 %
PLATELETS: 161 10*3/uL (ref 140–400)
RBC: 4.81 MIL/uL (ref 3.80–5.10)
RDW: 14.7 % (ref 11.0–15.0)
WBC: 4.5 10*3/uL (ref 3.8–10.8)

## 2016-04-27 LAB — TSH: TSH: 0.19 m[IU]/L — AB

## 2016-04-28 ENCOUNTER — Ambulatory Visit (INDEPENDENT_AMBULATORY_CARE_PROVIDER_SITE_OTHER): Payer: Medicare Other | Admitting: Internal Medicine

## 2016-04-28 VITALS — BP 140/82 | HR 79 | Temp 97.9°F | Wt 152.0 lb

## 2016-04-28 DIAGNOSIS — D708 Other neutropenia: Secondary | ICD-10-CM | POA: Diagnosis not present

## 2016-04-28 DIAGNOSIS — F411 Generalized anxiety disorder: Secondary | ICD-10-CM

## 2016-04-28 DIAGNOSIS — E039 Hypothyroidism, unspecified: Secondary | ICD-10-CM

## 2016-04-28 DIAGNOSIS — F329 Major depressive disorder, single episode, unspecified: Secondary | ICD-10-CM | POA: Diagnosis not present

## 2016-04-28 DIAGNOSIS — I10 Essential (primary) hypertension: Secondary | ICD-10-CM

## 2016-04-28 DIAGNOSIS — J0100 Acute maxillary sinusitis, unspecified: Secondary | ICD-10-CM

## 2016-04-28 DIAGNOSIS — F32A Depression, unspecified: Secondary | ICD-10-CM

## 2016-04-28 MED ORDER — CEFTRIAXONE SODIUM 1 G IJ SOLR
1.0000 g | Freq: Once | INTRAMUSCULAR | Status: AC
  Administered 2016-04-28: 1 g via INTRAMUSCULAR

## 2016-04-28 MED ORDER — LEVOFLOXACIN 500 MG PO TABS: 500.0000 mg | ORAL_TABLET | Freq: Every day | ORAL | 0 refills | Status: DC

## 2016-04-28 MED ORDER — LEVOTHYROXINE SODIUM 112 MCG PO TABS: 112.0000 ug | ORAL_TABLET | Freq: Every day | ORAL | 2 refills | Status: DC

## 2016-04-28 NOTE — Patient Instructions (Signed)
Rocephin 1 g IM given today for sinusitis. Levaquin 500 milligrams daily for 10 days for sinusitis. Decrease levothyroxine to 0.112 mg daily and follow-up in 6 weeks. Check blood pressure on return. May need to adjust blood pressure medication. Not feeling well today so nothing was done about this.

## 2016-04-28 NOTE — Progress Notes (Signed)
   Subjective:    Patient ID: Terri Bridges, female    DOB: 1934-02-19, 82 y.o.   MRN: [id number]  HPI   For follow up of low WBC and low TSH but has acute sinus infection today.Her white blood cell count has improved from 3403 months ago to 4500. Differential is stable. Her TSH remains low at 0.19 and 3 months ago was 0.23. We will change dose of levothyroxine.  Has had recurrent respiratory infection with malaise and fatigue. Cough and congestion. Maxillary sinus pressure. No fever or shaking chills. History of smoking and COPD.   Blood pressure is elevated today. Needs to be checked on return in May need to adjust antihypertensive medication. Not feeling well today so nothing was done about this.  Review of Systems see above     Objective:   Physical Exam Skin warm and dry. Nodes none. TMs are clear but chronically scarred neck supple. Pharynx clear. Chest occasional inspiratory wheezing but no rales appreciated. She looks very fatigued.       Assessment & Plan:  Low TSH- decrease Levothyroxine to 0.112 mg day and recheck TSH in 6 weeks.  Acute sinusitis- one gram IM Rocephin. Sterapred DS 10 mg 6 day dosepak take as directed and tapering course. Levaquin 500 milligrams daily for 10 days. Use albuterol inhaler 2 sprays by mouth 4 times daily. Symbicort 160/4.51 spray by mouth every 12 hours. Rest and drink plenty of fluids.  With regard to low white blood cell count-this seems to be resolved.  Elevated blood pressure-recheck upon return  25 minutes spent with patient addressing these problems

## 2016-05-03 ENCOUNTER — Encounter: Payer: Self-pay | Admitting: Internal Medicine

## 2016-05-31 DIAGNOSIS — H6983 Other specified disorders of Eustachian tube, bilateral: Secondary | ICD-10-CM | POA: Insufficient documentation

## 2016-05-31 DIAGNOSIS — H903 Sensorineural hearing loss, bilateral: Secondary | ICD-10-CM | POA: Diagnosis not present

## 2016-05-31 DIAGNOSIS — J324 Chronic pansinusitis: Secondary | ICD-10-CM | POA: Diagnosis not present

## 2016-05-31 DIAGNOSIS — H6993 Unspecified Eustachian tube disorder, bilateral: Secondary | ICD-10-CM | POA: Insufficient documentation

## 2016-06-09 ENCOUNTER — Encounter: Payer: Self-pay | Admitting: Internal Medicine

## 2016-06-09 ENCOUNTER — Ambulatory Visit (INDEPENDENT_AMBULATORY_CARE_PROVIDER_SITE_OTHER): Payer: Medicare Other | Admitting: Internal Medicine

## 2016-06-09 VITALS — BP 126/78 | HR 91 | Temp 98.1°F | Ht 59.0 in | Wt 154.0 lb

## 2016-06-09 DIAGNOSIS — L02232 Carbuncle of back [any part, except buttock]: Secondary | ICD-10-CM | POA: Diagnosis not present

## 2016-06-09 DIAGNOSIS — F32A Depression, unspecified: Secondary | ICD-10-CM

## 2016-06-09 DIAGNOSIS — F329 Major depressive disorder, single episode, unspecified: Secondary | ICD-10-CM

## 2016-06-09 DIAGNOSIS — H6522 Chronic serous otitis media, left ear: Secondary | ICD-10-CM

## 2016-06-09 DIAGNOSIS — I10 Essential (primary) hypertension: Secondary | ICD-10-CM

## 2016-06-09 DIAGNOSIS — E039 Hypothyroidism, unspecified: Secondary | ICD-10-CM

## 2016-06-09 MED ORDER — ALBUTEROL SULFATE (2.5 MG/3ML) 0.083% IN NEBU: INHALATION_SOLUTION | RESPIRATORY_TRACT | 1 refills | Status: DC

## 2016-06-09 NOTE — Progress Notes (Signed)
   Subjective:    Patient ID: Terri Bridges, female    DOB: 05-16-1934, 82 y.o.   MRN: [id number]  HPI   Six week follow up on elevated blood pressure Blood pressure good today.No changes in regimen.   TSH checked. Hx hypothyroidism.   To follow up with  Dr. Janace Hoard if left ear not improving.Treated here in April for acute sinusitis. Dr. Janace Hoard gave her Clindamycin recently.Not completely well at this time she says.  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  Still holding off on right  knee surgery. She is s/p arthroscopic surgery right knee in 2004.  Says she's been taking her Zoloft intermittently when she is depressed. I think she should take it daily. Think it would help with her depression and chronic pain.  She has a lesion on her back about the size of a quarter that may have been a carbuncle that opened up. It is clean and does not appear to be infected. I cleaned it today and redressed it. She should continue to do this until healed.       Review of Systems see above     Objective:   Physical Exam Quarter-sized area circular superficial without evidence of secondary bacterial infection. She should continue to dress it after cleaning with peroxide and applying antibacterial ointment. It is right over her mid thoracic back.  Neck is supple. No JVD thyromegaly or carotid bruits. Chest clear. Cardiac exam regular rate and rhythm. Left TM is slightly full but not red  She sounds nasally congested       Assessment & Plan:  Left serous otitis media-finish course of clindamycin prescribe a Dr. Janace Hoard  Hypothyroidism-TSH checked today  Carbuncle mid back-continue topical treatment until healed  Osteoarthritis of knee  Hypertension-stable on current regimen  Plan: She'll be scheduled for physical exam mid January 2019.

## 2016-06-09 NOTE — Patient Instructions (Signed)
Finished clindamycin prescribed by Dr. Janace Hoard and follow-up with him as needed. Continue same medications and return in 6 months. Takes Zoloft consistently.

## 2016-06-10 LAB — TSH: TSH: 0.22 m[IU]/L — AB

## 2016-06-12 ENCOUNTER — Telehealth: Payer: Self-pay

## 2016-06-12 MED ORDER — LEVOTHYROXINE SODIUM 100 MCG PO TABS: 100.0000 ug | ORAL_TABLET | Freq: Every day | ORAL | 0 refills | Status: DC

## 2016-06-12 NOTE — Telephone Encounter (Signed)
100 micrograms

## 2016-06-12 NOTE — Telephone Encounter (Signed)
Please confirm dosage for the synthroid in mcg before I send in medication

## 2016-06-12 NOTE — Telephone Encounter (Signed)
Sent electronically 

## 2016-06-12 NOTE — Telephone Encounter (Signed)
-----   Message from Elby Showers, MD sent at 06/11/2016  4:44 PM EDT ----- Once she finishes current bottle of thyroid medication she should change dose to 0.1 mg daily and follow up here 6 weeks later for TSH and no OV. Call in Levothyroxine 0.01mg  daily. TSH is too low on current dose.

## 2016-06-26 DIAGNOSIS — H6982 Other specified disorders of Eustachian tube, left ear: Secondary | ICD-10-CM | POA: Diagnosis not present

## 2016-07-28 DIAGNOSIS — H903 Sensorineural hearing loss, bilateral: Secondary | ICD-10-CM | POA: Diagnosis not present

## 2016-07-28 DIAGNOSIS — H6983 Other specified disorders of Eustachian tube, bilateral: Secondary | ICD-10-CM | POA: Diagnosis not present

## 2016-07-31 ENCOUNTER — Other Ambulatory Visit: Payer: Self-pay | Admitting: Internal Medicine

## 2016-08-15 IMAGING — RF DG ESOPHAGUS
11 of 17 series · 14 of 24 positions shown · non-contrast
Comparison: 11/01/2006 esophagram.

CLINICAL DATA: 72-year-old female with chronic gastroesophageal
reflux disease and hiatal hernia with intermittent chronic
dysphagia, globus sensation and vomiting.

EXAM:
ESOPHOGRAM / BARIUM SWALLOW / BARIUM TABLET STUDY
TECHNIQUE: Combined double contrast and single contrast examination performed
using effervescent crystals, thick barium liquid, and thin barium
liquid. The patient was observed with fluoroscopy swallowing a 13 mm
barium sulphate tablet.
FLUOROSCOPY TIME:  Fluoroscopy Time:  3 minutes 6 seconds.
Number of Acquired Images: 6 exposures (remaining images are screen
/ cine captures).

[Series 1: cp_standard · 0.34mm/px · 2 of 18 frames shown (1 of 7)]
[frame 2/18]
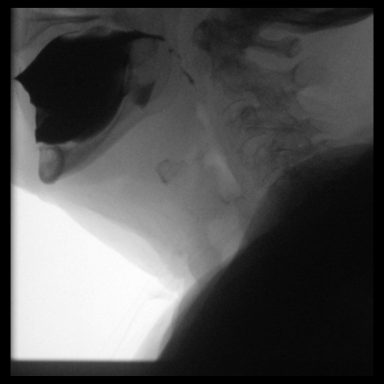
[frame 10/18]
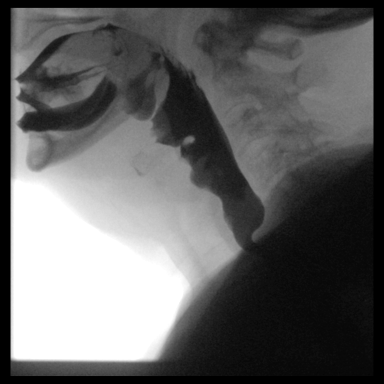

[Series 3: cp_standard · 0.34mm/px · 3 of 12 frames shown (2 of 7)]
[frame 1/12]
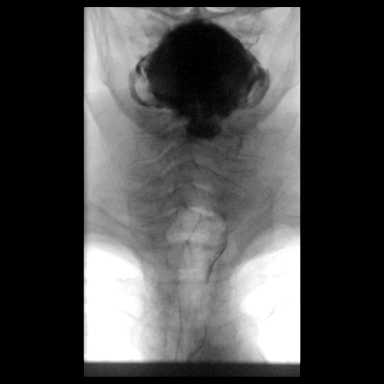
[frame 7/12]
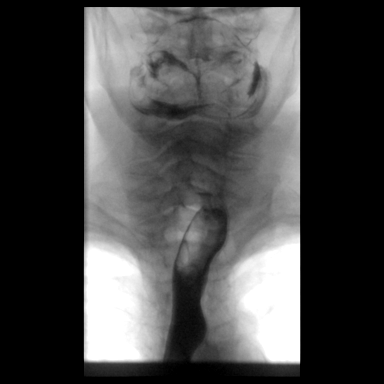
[frame 11/12]
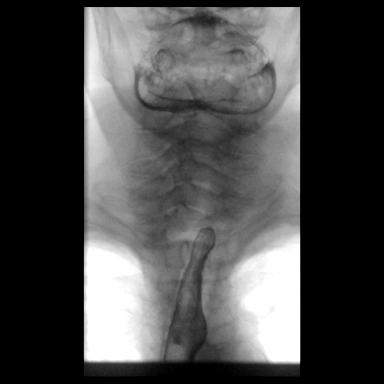

[Series 6: fluoro_barium 2fps_bw · 0.17mm/px · 1 of 1 slices shown (1 of 4)]
[im 1/1]
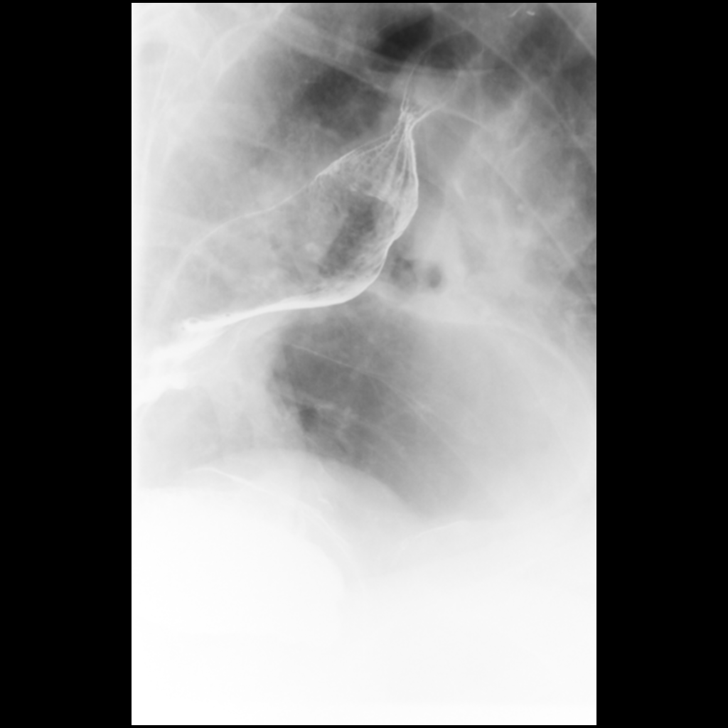

[Series 8: cp_standard · 0.17mm/px · 1 of 1 slices shown (3 of 7)]
[im 1/1]
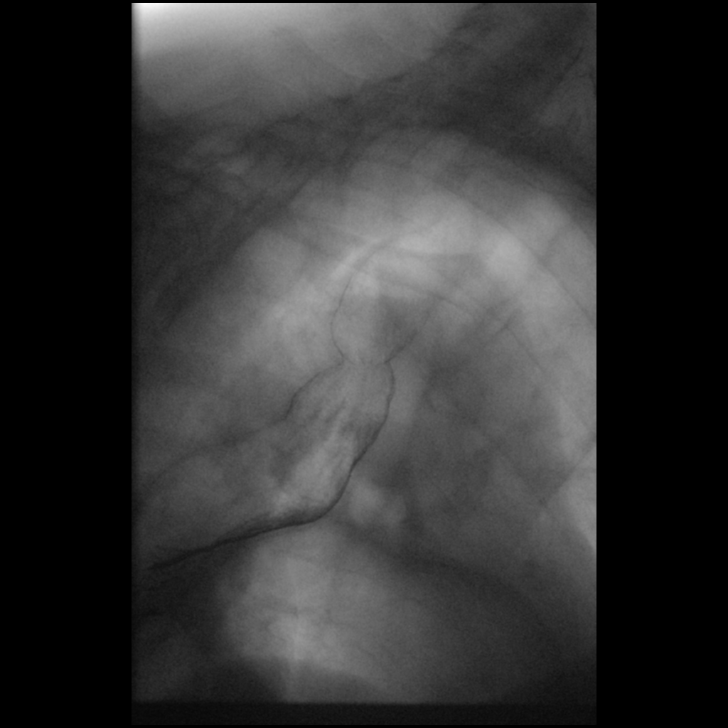

[Series 9: cp_standard · 0.17mm/px · 1 of 1 slices shown (4 of 7)]
[im 1/1]
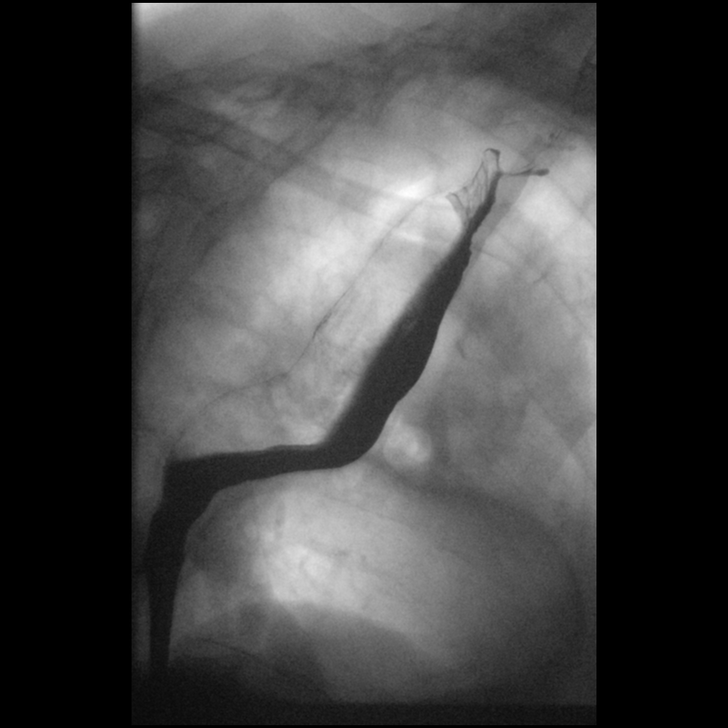

[Series 11: fluoro_barium 2fps_bw · 0.17mm/px · 1 of 1 slices shown (2 of 4)]
[im 1/1]
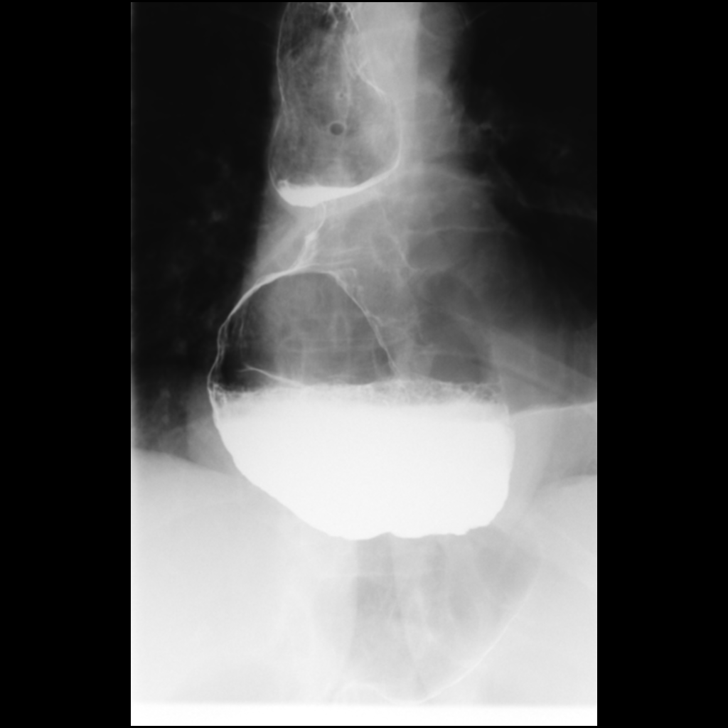

[Series 12: cp_standard · 0.41mm/px · 1 of 48 frames shown (5 of 7)]
[frame 41/48]
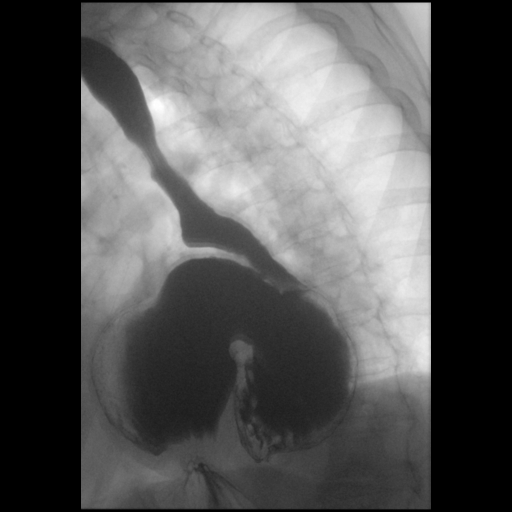

[Series 13: fluoro_barium 2fps_bw · 0.21mm/px · 1 of 1 slices shown (3 of 4)]
[im 1/1]
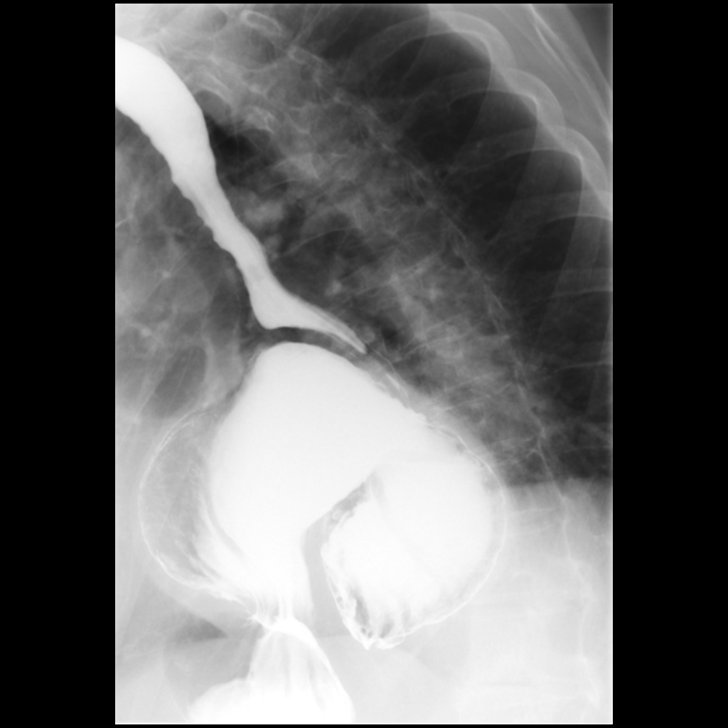

[Series 14: cp_standard · 0.21mm/px · 1 of 1 slices shown (6 of 7)]
[im 1/1]
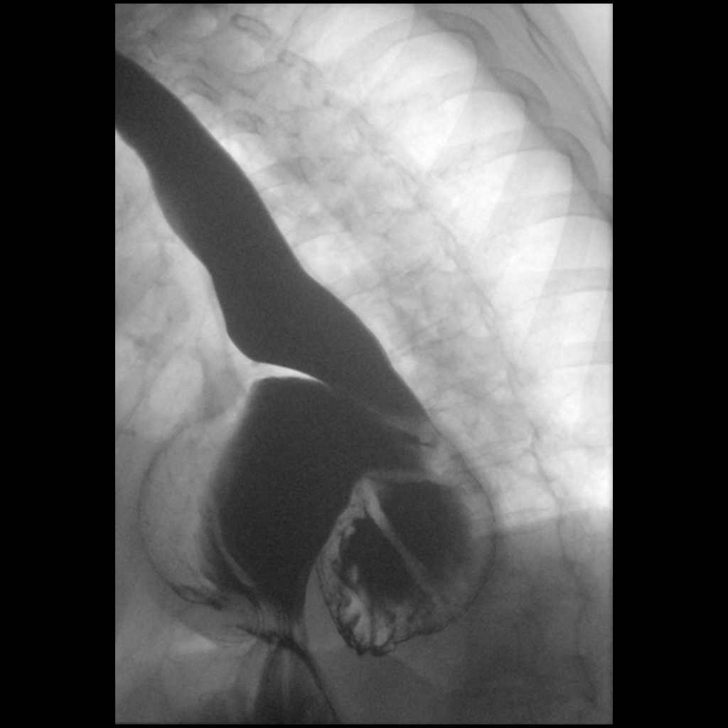

[Series 17: cp_standard · 0.19mm/px · 1 of 1 slices shown (7 of 7)]
[im 1/1]
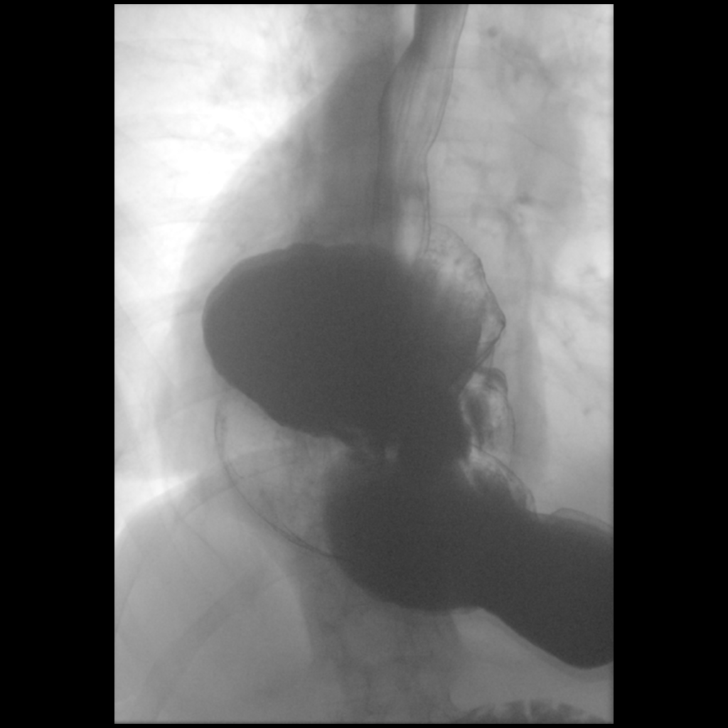

[Series 19: fluoro_barium 2fps_bw · 0.20mm/px · 1 of 1 slices shown (4 of 4)]
[im 1/1]
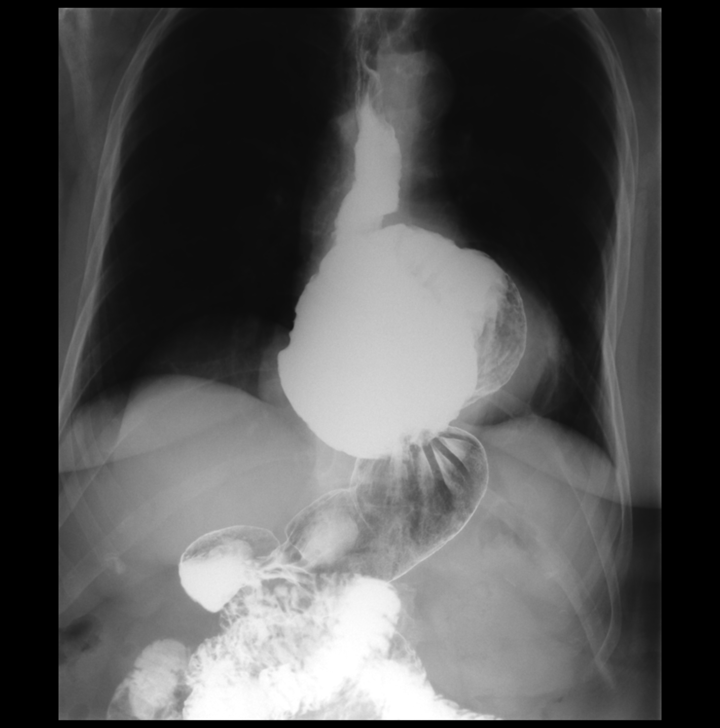

[14 of 24 positions shown; findings below may reference images not displayed]

FINDINGS: The oral and pharyngeal phases of swallowing are normal, with no
laryngeal penetration or tracheobronchial aspiration. No significant
barium retention in the pharynx. No pharyngeal mass, stricture or
diverticulum.

There is moderate cricopharyngeus muscle dysfunction, characterized
by delayed and incomplete opening of the cricopharyngeus muscle.

Moderate to large hiatal hernia, mildly increased in size since
9003. Spontaneous gastroesophageal reflux to the level of the
thoracic inlet. Mild-to-moderate esophageal dysmotility,
characterized by intermittent weakening of primary peristalsis in
the mid to lower thoracic esophagus. There is diffuse granularity of
the esophageal mucosa, consistent with mild reflux esophagitis. No
esophageal mass, ulcer or stricture is detected. A swallowed 13 mm
barium tablet traversed the esophagus into the stomach without
delay.
IMPRESSION: 1. Moderate to large hiatal hernia, mildly increased in size since
[DATE]. Spontaneous moderate gastroesophageal reflux.
3. Mild reflux esophagitis. No esophageal ulcer, mass or stricture
detected.
4. Moderate cricopharyngeus muscle dysfunction and mild to moderate
esophageal dysmotility, characteristic of chronic gastroesophageal
reflux disease.
5. Normal oral and pharyngeal phases of swallowing, with no
laryngeal penetration or tracheobronchial aspiration.

## 2016-09-12 ENCOUNTER — Other Ambulatory Visit: Payer: Self-pay | Admitting: Internal Medicine

## 2016-09-25 ENCOUNTER — Other Ambulatory Visit: Payer: Self-pay | Admitting: Internal Medicine

## 2016-10-06 ENCOUNTER — Encounter: Payer: Self-pay | Admitting: Internal Medicine

## 2016-10-06 ENCOUNTER — Telehealth: Payer: Self-pay | Admitting: Internal Medicine

## 2016-10-06 ENCOUNTER — Ambulatory Visit (INDEPENDENT_AMBULATORY_CARE_PROVIDER_SITE_OTHER): Payer: Medicare Other | Admitting: Internal Medicine

## 2016-10-06 VITALS — BP 142/80 | HR 86 | Temp 98.4°F | Wt 158.0 lb

## 2016-10-06 DIAGNOSIS — L089 Local infection of the skin and subcutaneous tissue, unspecified: Secondary | ICD-10-CM

## 2016-10-06 DIAGNOSIS — S80811A Abrasion, right lower leg, initial encounter: Secondary | ICD-10-CM | POA: Diagnosis not present

## 2016-10-06 DIAGNOSIS — L03115 Cellulitis of right lower limb: Secondary | ICD-10-CM

## 2016-10-06 MED ORDER — MUPIROCIN 2 % EX OINT: 1.0000 "application " | TOPICAL_OINTMENT | Freq: Two times a day (BID) | CUTANEOUS | 0 refills | Status: DC

## 2016-10-06 MED ORDER — DOXYCYCLINE HYCLATE 100 MG PO TABS: 100.0000 mg | ORAL_TABLET | Freq: Two times a day (BID) | ORAL | 0 refills | Status: DC

## 2016-10-06 NOTE — Telephone Encounter (Signed)
Pt is aware.  

## 2016-10-06 NOTE — Patient Instructions (Signed)
Doxycycline 100 mg twice daily for 10 days. Clean with warm soapy water followed by peroxide, apply Bactroban ointment and cover with Telfa. Return in one week or sooner if worse. Tetanus immunization is up-to-date.

## 2016-10-06 NOTE — Telephone Encounter (Signed)
Patient called; states that she hit her leg; around the shin area last week.  She has been cleaning it and trying to deal with it on her own.  Apparently it is not healing well.  It is pussy looking and red around that area and warm to the touch.  She sent a picture to my e-mail and I have forwarded that picture for your review.  Patient wants to know if she should come in for a visit?  She is at the hospital today because her Son is having surgery.    Best # for contact:  [phone] or  [phone] (Patients phone is not picking up very well at the hospital, so she has requested that if we cannot get her on her phone that we call the secondary number provided.  This is her daughter-in-law, Frieda Arnall who sent the picture to [place] today.  She will let us speak with Stanton Kidney on her phone).    Thank you.

## 2016-10-06 NOTE — Telephone Encounter (Signed)
Can she be here at 4:30 to be worked in?

## 2016-10-06 NOTE — Progress Notes (Signed)
   Subjective:    Patient ID: Terri Bridges, female    DOB: 12-19-1934, 82 y.o.   MRN: [id number]  HPI Patient called early afternoon saying that she had a leg injury that was not healing well. She thought it might be infected. She was asking what to do about it and office visit was advised. Apparently an object struck her leg about 3 weeks ago causing a superficial abrasion right anterior lower leg. She was cleaning it with peroxide after washing with soap and water. She was applying over-the-counter antibiotic ointment but it simply has not healed and nail she has some erythema surrounding the wound.  Had tetanus immunization March 2015.    Review of Systems see above. No fever or shaking chills.     Objective:   Physical Exam  There is a quarter sized lesion right lower anterior leg with an exudate covering it. There is surrounding erythema and increased warmth to the leg      Assessment & Plan:  Abrasion right lower leg that is infected  Cellulitis right lower leg  Plan: Doxycycline 100 mg twice daily for 10 days. Bactroban ointment to apply to right leg twice daily after cleaning it with warm soapy water, applying peroxide and then the ointment. May cover with Telfa. Follow-up in one week.

## 2016-10-13 ENCOUNTER — Ambulatory Visit (INDEPENDENT_AMBULATORY_CARE_PROVIDER_SITE_OTHER): Payer: Medicare Other | Admitting: Internal Medicine

## 2016-10-13 ENCOUNTER — Encounter: Payer: Self-pay | Admitting: Internal Medicine

## 2016-10-13 VITALS — BP 100/60 | HR 67 | Temp 98.3°F | Wt 157.0 lb

## 2016-10-13 DIAGNOSIS — Z23 Encounter for immunization: Secondary | ICD-10-CM

## 2016-10-13 DIAGNOSIS — L03115 Cellulitis of right lower limb: Secondary | ICD-10-CM | POA: Diagnosis not present

## 2016-10-13 NOTE — Progress Notes (Signed)
   Subjective:    Patient ID: Terri Bridges, female    DOB: [birth year], 82 y.o.   MRN: [id number]  HPI 82 year old Female seen September 28 for infected abrasion right lower extremity with cellulitis.  Apparently had struck leg some 3 weeks prior to last visit causing a superficial abrasion right anterior lower leg.  She was doing topical care appropriately but despite this leg had become infected. Tetanus immunization was up-to-date having been given March 2015.  She was started on doxycycline and Bactroban ointment.  She is now here for follow-up.   Review of Systems see above     Objective:   Physical Exam Right lower extremity abrasion is healing.  Cellulitis is resolving.  Much less erythema and no tenderness.       Assessment & Plan:  Resolving cellulitis right lower extremity  Plan: Finish course of antibiotics prescribed and continue Bactroban ointment until abrasion is healed.

## 2016-11-04 NOTE — Patient Instructions (Signed)
Flu vaccine given.  Finish course of antibiotics.  Continue topical care until abrasion has completely healed

## 2016-11-08 ENCOUNTER — Ambulatory Visit (INDEPENDENT_AMBULATORY_CARE_PROVIDER_SITE_OTHER): Payer: Medicare Other

## 2016-11-08 ENCOUNTER — Ambulatory Visit (INDEPENDENT_AMBULATORY_CARE_PROVIDER_SITE_OTHER): Payer: Medicare Other | Admitting: Podiatry

## 2016-11-08 ENCOUNTER — Encounter: Payer: Self-pay | Admitting: Podiatry

## 2016-11-08 VITALS — BP 166/87 | HR 84 | Resp 16

## 2016-11-08 DIAGNOSIS — M722 Plantar fascial fibromatosis: Secondary | ICD-10-CM

## 2016-11-08 DIAGNOSIS — M779 Enthesopathy, unspecified: Secondary | ICD-10-CM | POA: Diagnosis not present

## 2016-11-08 MED ORDER — TRIAMCINOLONE ACETONIDE 10 MG/ML IJ SUSP
10.0000 mg | Freq: Once | INTRAMUSCULAR | Status: AC
  Administered 2016-11-08: 10 mg

## 2016-11-08 NOTE — Progress Notes (Signed)
   Subjective:    Patient ID: Terri Bridges, female    DOB: May 15, 1934, 83 y.o.   MRN: [id number]  HPI    Review of Systems  All other systems reviewed and are negative.      Objective:   Physical Exam        Assessment & Plan:

## 2016-11-08 NOTE — Patient Instructions (Addendum)

## 2016-11-08 NOTE — Progress Notes (Signed)
Subjective:    Patient ID: Terri Bridges, female   DOB: 82 y.o.   MRN: [id number]   HPI patient presents with quite a bit of discomfort in the medial side of the left ankle stating that it's been very tender and making it hard to walk for the last 2 months patient does not smoke currently and likes to be active    Review of Systems  All other systems reviewed and are negative.       Objective:  Physical Exam  Constitutional: She appears well-developed and well-nourished.  Cardiovascular: Intact distal pulses.   Pulmonary/Chest: Effort normal.  Musculoskeletal: Normal range of motion.  Neurological: She is alert.  Skin: Skin is warm.  Nursing note and vitals reviewed.  neurovascular status intact muscle strength adequate range of motion within normal limits with patient found to have quite a bit of inflammation in the medial aspect of the left foot around the posterior tibial insertion into the navicular with no indication of tendon dysfunction or muscle loss structure. Patient has moderate depression of the arch noted     Assessment:   Tendinitis of the posterior tibial tendon left at its insertion into the navicular      Plan:   H&P condition and x-ray reviewed with patient. I went ahead today and I did a careful sheath injection of the tendon as it inserts into the navicular with 3 mg Kenalog 5 mg Xylocaine and I applied fascial brace to lift up the arch. Patient will be seen back to recheck  X-ray indicates that there is moderate depression of the arch with no indications of bone pathology

## 2016-11-22 ENCOUNTER — Other Ambulatory Visit: Payer: Self-pay | Admitting: Podiatry

## 2016-11-22 ENCOUNTER — Ambulatory Visit (INDEPENDENT_AMBULATORY_CARE_PROVIDER_SITE_OTHER): Payer: Medicare Other

## 2016-11-22 ENCOUNTER — Encounter: Payer: Self-pay | Admitting: Podiatry

## 2016-11-22 ENCOUNTER — Ambulatory Visit (INDEPENDENT_AMBULATORY_CARE_PROVIDER_SITE_OTHER): Payer: Medicare Other | Admitting: Podiatry

## 2016-11-22 DIAGNOSIS — M779 Enthesopathy, unspecified: Secondary | ICD-10-CM

## 2016-11-22 DIAGNOSIS — M79671 Pain in right foot: Secondary | ICD-10-CM

## 2016-11-22 DIAGNOSIS — M722 Plantar fascial fibromatosis: Secondary | ICD-10-CM

## 2016-11-22 MED ORDER — TRIAMCINOLONE ACETONIDE 10 MG/ML IJ SUSP
10.0000 mg | Freq: Once | INTRAMUSCULAR | Status: AC
  Administered 2016-11-22: 10 mg

## 2016-11-22 NOTE — Progress Notes (Signed)
Subjective:    Patient ID: Terri Bridges, female   DOB: 82 y.o.   MRN: [id number]   HPI patient states left foot is doing well but having pain in the outside of the right foot    ROS      Objective:  Physical Exam neurovascular status intact with peroneal tendinitis right with left posterior tib doing well     Assessment:    Tendinitis right present     Plan:    I did inject the tendon complex right at the insertion peroneal 3 mg Kenalog 5 mg Xylocaine advised on ice therapy anti-inflammatories and reappoint to recheck

## 2016-12-04 ENCOUNTER — Ambulatory Visit (INDEPENDENT_AMBULATORY_CARE_PROVIDER_SITE_OTHER): Payer: Medicare Other | Admitting: Internal Medicine

## 2016-12-04 ENCOUNTER — Encounter: Payer: Self-pay | Admitting: Internal Medicine

## 2016-12-04 VITALS — BP 120/70 | HR 73 | Temp 98.1°F | Wt 156.0 lb

## 2016-12-04 DIAGNOSIS — J209 Acute bronchitis, unspecified: Secondary | ICD-10-CM

## 2016-12-04 DIAGNOSIS — J440 Chronic obstructive pulmonary disease with acute lower respiratory infection: Secondary | ICD-10-CM

## 2016-12-04 MED ORDER — PREDNISONE 10 MG PO TABS: ORAL_TABLET | ORAL | 0 refills | Status: DC

## 2016-12-04 MED ORDER — HYDROCODONE-HOMATROPINE 5-1.5 MG/5ML PO SYRP: 5.0000 mL | ORAL_SOLUTION | Freq: Three times a day (TID) | ORAL | 0 refills | Status: DC | PRN

## 2016-12-04 MED ORDER — LEVOFLOXACIN 500 MG PO TABS: 500.0000 mg | ORAL_TABLET | Freq: Every day | ORAL | 0 refills | Status: DC

## 2016-12-04 NOTE — Patient Instructions (Signed)
Take prednisone and tapering course as directed.  Levaquin 500 mg daily with a meal for 10 days.  Use albuterol inhaler/nebulizer.  Symbicort daily.  Take Hycodan sparingly for cough.  Rest and drink plenty of fluids.

## 2016-12-04 NOTE — Progress Notes (Signed)
   Subjective:    Patient ID: Terri Bridges, female    DOB: February 15, 1934, 82 y.o.   MRN: [id number]  HPI Onset of URI symptoms on Wednesday, November 21.  Has had cough and congestion.  He is out of Symbicort inhaler.  Was using albuterol inhaler and/or nebulizeronly.  She has discolored sputum production.  She tried taking over-the-counter decongestants and that caused tachycardia.  History of COPD history of bronchitis.   Review of Systems see above- does have sore throat     Objective:   Physical Exam She has a grommet tube in her left ear.  Right ear clear.  Pharynx slightly injected.  Neck is supple.  No adenopathy.  Chest clear to auscultation without wheezing or rhonchi.  No rales appreciated.  Skin is warm and dry.       Assessment & Plan:  Acute bronchitis  COPD  Plan: Sample of Symbicort inhaler 160/4.5 given to use 1 spray p.o. every 12 hours.  She has albuterol inhaler and.  Prednisone 10 mg (#21) going from 60 mg to 0 mg over 7 days.  Hycodan 1 teaspoon p.o. every 8 hours as needed cough.  Rest and drink plenty of fluids.

## 2016-12-15 ENCOUNTER — Other Ambulatory Visit: Payer: Self-pay | Admitting: Internal Medicine

## 2016-12-19 ENCOUNTER — Other Ambulatory Visit: Payer: Self-pay | Admitting: Internal Medicine

## 2016-12-24 ENCOUNTER — Other Ambulatory Visit: Payer: Self-pay | Admitting: Internal Medicine

## 2017-01-08 ENCOUNTER — Other Ambulatory Visit: Payer: Self-pay | Admitting: Internal Medicine

## 2017-01-08 DIAGNOSIS — N183 Chronic kidney disease, stage 3 unspecified: Secondary | ICD-10-CM

## 2017-01-08 DIAGNOSIS — Z0000 Encounter for general adult medical examination without abnormal findings: Secondary | ICD-10-CM

## 2017-01-08 DIAGNOSIS — Z1321 Encounter for screening for nutritional disorder: Secondary | ICD-10-CM

## 2017-01-08 DIAGNOSIS — Z13220 Encounter for screening for lipoid disorders: Secondary | ICD-10-CM

## 2017-01-08 DIAGNOSIS — E039 Hypothyroidism, unspecified: Secondary | ICD-10-CM

## 2017-01-22 ENCOUNTER — Other Ambulatory Visit: Payer: Medicare Other | Admitting: Internal Medicine

## 2017-01-22 DIAGNOSIS — Z0000 Encounter for general adult medical examination without abnormal findings: Secondary | ICD-10-CM

## 2017-01-22 DIAGNOSIS — N183 Chronic kidney disease, stage 3 unspecified: Secondary | ICD-10-CM

## 2017-01-22 DIAGNOSIS — Z13220 Encounter for screening for lipoid disorders: Secondary | ICD-10-CM | POA: Diagnosis not present

## 2017-01-22 DIAGNOSIS — E039 Hypothyroidism, unspecified: Secondary | ICD-10-CM | POA: Diagnosis not present

## 2017-01-22 DIAGNOSIS — Z1321 Encounter for screening for nutritional disorder: Secondary | ICD-10-CM | POA: Diagnosis not present

## 2017-01-23 LAB — CBC WITH DIFFERENTIAL/PLATELET
BASOS PCT: 0.7 %
Basophils Absolute: 29 cells/uL (ref 0–200)
EOS ABS: 160 {cells}/uL (ref 15–500)
Eosinophils Relative: 3.9 %
HEMATOCRIT: 38.7 % (ref 35.0–45.0)
Hemoglobin: 13 g/dL (ref 11.7–15.5)
LYMPHS ABS: 1136 {cells}/uL (ref 850–3900)
MCH: 27.9 pg (ref 27.0–33.0)
MCHC: 33.6 g/dL (ref 32.0–36.0)
MCV: 83 fL (ref 80.0–100.0)
MPV: 10.2 fL (ref 7.5–12.5)
Monocytes Relative: 9.7 %
NEUTROS PCT: 58 %
Neutro Abs: 2378 cells/uL (ref 1500–7800)
Platelets: 182 10*3/uL (ref 140–400)
RBC: 4.66 10*6/uL (ref 3.80–5.10)
RDW: 13.3 % (ref 11.0–15.0)
Total Lymphocyte: 27.7 %
WBC: 4.1 10*3/uL (ref 3.8–10.8)
WBCMIX: 398 {cells}/uL (ref 200–950)

## 2017-01-23 LAB — COMPLETE METABOLIC PANEL WITH GFR
AG Ratio: 1.9 (calc) (ref 1.0–2.5)
ALKALINE PHOSPHATASE (APISO): 65 U/L (ref 33–130)
ALT: 13 U/L (ref 6–29)
AST: 17 U/L (ref 10–35)
Albumin: 3.8 g/dL (ref 3.6–5.1)
BUN/Creatinine Ratio: 17 (calc) (ref 6–22)
BUN: 18 mg/dL (ref 7–25)
CALCIUM: 9 mg/dL (ref 8.6–10.4)
CO2: 32 mmol/L (ref 20–32)
CREATININE: 1.06 mg/dL — AB (ref 0.60–0.88)
Chloride: 107 mmol/L (ref 98–110)
GFR, EST NON AFRICAN AMERICAN: 50 mL/min/{1.73_m2} — AB (ref >=60)
GFR, Est African American: 57 mL/min/{1.73_m2} — ABNORMAL LOW (ref >=60)
GLUCOSE: 80 mg/dL (ref 65–99)
Globulin: 2 g/dL (calc) (ref 1.9–3.7)
Potassium: 4.1 mmol/L (ref 3.5–5.3)
Sodium: 143 mmol/L (ref 135–146)
TOTAL PROTEIN: 5.8 g/dL — AB (ref 6.1–8.1)
Total Bilirubin: 0.6 mg/dL (ref 0.2–1.2)

## 2017-01-23 LAB — TSH: TSH: 1.7 m[IU]/L (ref 0.40–4.50)

## 2017-01-23 LAB — LIPID PANEL
CHOLESTEROL: 185 mg/dL (ref <200)
HDL: 76 mg/dL (ref >50)
LDL Cholesterol (Calc): 95 mg/dL (calc)
Non-HDL Cholesterol (Calc): 109 mg/dL (calc) (ref <130)
Total CHOL/HDL Ratio: 2.4 (calc) (ref <5.0)
Triglycerides: 59 mg/dL (ref <150)

## 2017-01-23 LAB — VITAMIN D 25 HYDROXY (VIT D DEFICIENCY, FRACTURES): Vit D, 25-Hydroxy: 35 ng/mL (ref 30–100)

## 2017-01-25 ENCOUNTER — Encounter: Payer: Self-pay | Admitting: Internal Medicine

## 2017-01-25 ENCOUNTER — Ambulatory Visit (INDEPENDENT_AMBULATORY_CARE_PROVIDER_SITE_OTHER): Payer: Medicare Other | Admitting: Internal Medicine

## 2017-01-25 VITALS — BP 140/80 | HR 84 | Ht 59.0 in | Wt 157.3 lb

## 2017-01-25 DIAGNOSIS — S80811A Abrasion, right lower leg, initial encounter: Secondary | ICD-10-CM

## 2017-01-25 DIAGNOSIS — F329 Major depressive disorder, single episode, unspecified: Secondary | ICD-10-CM

## 2017-01-25 DIAGNOSIS — M810 Age-related osteoporosis without current pathological fracture: Secondary | ICD-10-CM

## 2017-01-25 DIAGNOSIS — K219 Gastro-esophageal reflux disease without esophagitis: Secondary | ICD-10-CM | POA: Diagnosis not present

## 2017-01-25 DIAGNOSIS — E039 Hypothyroidism, unspecified: Secondary | ICD-10-CM | POA: Diagnosis not present

## 2017-01-25 DIAGNOSIS — F419 Anxiety disorder, unspecified: Secondary | ICD-10-CM | POA: Diagnosis not present

## 2017-01-25 DIAGNOSIS — J329 Chronic sinusitis, unspecified: Secondary | ICD-10-CM

## 2017-01-25 DIAGNOSIS — J309 Allergic rhinitis, unspecified: Secondary | ICD-10-CM | POA: Diagnosis not present

## 2017-01-25 DIAGNOSIS — R8290 Unspecified abnormal findings in urine: Secondary | ICD-10-CM

## 2017-01-25 DIAGNOSIS — Z0000 Encounter for general adult medical examination without abnormal findings: Secondary | ICD-10-CM

## 2017-01-25 DIAGNOSIS — Z8709 Personal history of other diseases of the respiratory system: Secondary | ICD-10-CM

## 2017-01-25 DIAGNOSIS — I10 Essential (primary) hypertension: Secondary | ICD-10-CM

## 2017-01-25 DIAGNOSIS — F32A Depression, unspecified: Secondary | ICD-10-CM

## 2017-01-25 LAB — POCT URINALYSIS DIPSTICK
Appearance: NORMAL
Bilirubin, UA: NEGATIVE
Glucose, UA: NEGATIVE
KETONES UA: NEGATIVE
NITRITE UA: NEGATIVE
Odor: NORMAL
PROTEIN UA: NEGATIVE
RBC UA: NEGATIVE
SPEC GRAV UA: 1.015 (ref 1.010–1.025)
UROBILINOGEN UA: 0.2 U/dL
pH, UA: 6.5 (ref 5.0–8.0)

## 2017-01-25 MED ORDER — MUPIROCIN 2 % EX OINT: 1.0000 "application " | TOPICAL_OINTMENT | Freq: Two times a day (BID) | CUTANEOUS | 0 refills | Status: DC
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
## 2017-01-26 LAB — URINE CULTURE
MICRO NUMBER: 90073383
RESULT: NO GROWTH
SPECIMEN QUALITY: ADEQUATE

## 2017-02-04 ENCOUNTER — Encounter: Payer: Self-pay | Admitting: Internal Medicine

## 2017-02-04 NOTE — Patient Instructions (Addendum)
Apply Bactroban to abrasion on leg until healed.  Continue same medications and return in 6 months.  Urine has been cultured.

## 2017-02-12 DIAGNOSIS — H10413 Chronic giant papillary conjunctivitis, bilateral: Secondary | ICD-10-CM | POA: Diagnosis not present

## 2017-02-12 DIAGNOSIS — H02051 Trichiasis without entropian right upper eyelid: Secondary | ICD-10-CM | POA: Diagnosis not present

## 2017-02-12 DIAGNOSIS — H5213 Myopia, bilateral: Secondary | ICD-10-CM | POA: Diagnosis not present

## 2017-02-12 DIAGNOSIS — H02054 Trichiasis without entropian left upper eyelid: Secondary | ICD-10-CM | POA: Diagnosis not present

## 2017-02-20 ENCOUNTER — Other Ambulatory Visit: Payer: Self-pay | Admitting: Internal Medicine

## 2017-02-27 ENCOUNTER — Other Ambulatory Visit: Payer: Self-pay | Admitting: Internal Medicine

## 2017-03-01 ENCOUNTER — Other Ambulatory Visit: Payer: Self-pay | Admitting: Internal Medicine

## 2017-03-07 ENCOUNTER — Encounter: Payer: Self-pay | Admitting: Internal Medicine

## 2017-03-07 DIAGNOSIS — M810 Age-related osteoporosis without current pathological fracture: Secondary | ICD-10-CM | POA: Diagnosis not present

## 2017-03-07 DIAGNOSIS — Z1231 Encounter for screening mammogram for malignant neoplasm of breast: Secondary | ICD-10-CM | POA: Diagnosis not present

## 2017-03-07 DIAGNOSIS — M85851 Other specified disorders of bone density and structure, right thigh: Secondary | ICD-10-CM | POA: Diagnosis not present

## 2017-03-18 ENCOUNTER — Other Ambulatory Visit: Payer: Self-pay | Admitting: Internal Medicine

## 2017-03-21 ENCOUNTER — Telehealth: Payer: Self-pay

## 2017-03-21 NOTE — Telephone Encounter (Signed)
Called patient and left a voicemail letting her know that Dr. Renold Genta recommends Terri Bridges. Patient to call back to let us know if she is willing to start [place].

## 2017-03-27 ENCOUNTER — Other Ambulatory Visit: Payer: Self-pay | Admitting: Internal Medicine

## 2017-03-29 ENCOUNTER — Encounter: Payer: Self-pay | Admitting: Internal Medicine

## 2017-03-29 ENCOUNTER — Telehealth: Payer: Self-pay | Admitting: Internal Medicine

## 2017-03-29 ENCOUNTER — Other Ambulatory Visit: Payer: Self-pay

## 2017-03-29 MED ORDER — IBANDRONATE SODIUM 150 MG PO TABS: 150.0000 mg | ORAL_TABLET | ORAL | 5 refills | Status: DC

## 2017-03-29 NOTE — Telephone Encounter (Signed)
Bone density results consistent with osteoporosis. Trial of Boniva Monthly.

## 2017-04-10 ENCOUNTER — Other Ambulatory Visit: Payer: Self-pay | Admitting: Internal Medicine

## 2017-06-25 ENCOUNTER — Ambulatory Visit (INDEPENDENT_AMBULATORY_CARE_PROVIDER_SITE_OTHER): Payer: Medicare Other | Admitting: Internal Medicine

## 2017-06-25 ENCOUNTER — Encounter: Payer: Self-pay | Admitting: Internal Medicine

## 2017-06-25 VITALS — BP 120/80 | HR 82 | Temp 98.8°F | Ht 59.0 in | Wt 153.0 lb

## 2017-06-25 DIAGNOSIS — K4190 Unilateral femoral hernia, without obstruction or gangrene, not specified as recurrent: Secondary | ICD-10-CM | POA: Diagnosis not present

## 2017-06-25 NOTE — Patient Instructions (Signed)
Recommend CT of abdomen and pelvis for further evaluation.

## 2017-06-25 NOTE — Progress Notes (Signed)
   Subjective:    Patient ID: Terri Bridges, female    DOB: Aug 23, 1934, 83 y.o.   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  About a month ago she noticed a bulge in her left lower quadrant.  She denies pain constipation or diarrhea.  She is concerned about a hernia.  Because of this she quit her job recently.    Review of Systems see above     Objective:   Physical Exam Indeed there is a bulge in the left lower abdomen compared to the right lower abdomen.  It is approximately 5 cm long.  There is no redness or firmness.       Assessment & Plan:  ?  Abdominal hernia  Plan: Recommend CT of abdomen and pelvis for further evaluation.

## 2017-06-27 ENCOUNTER — Other Ambulatory Visit: Payer: Self-pay | Admitting: Internal Medicine

## 2017-06-28 ENCOUNTER — Other Ambulatory Visit: Payer: Self-pay

## 2017-06-28 DIAGNOSIS — N183 Chronic kidney disease, stage 3 unspecified: Secondary | ICD-10-CM

## 2017-06-28 DIAGNOSIS — E039 Hypothyroidism, unspecified: Secondary | ICD-10-CM

## 2017-07-02 ENCOUNTER — Telehealth: Payer: Self-pay

## 2017-07-02 NOTE — Telephone Encounter (Signed)
Left detailed message CT abd was scheduled at [facility] arrival time is 4:30pm on 07/17/17, NPO 4 hrs prior to appt, patient will need to pick up contrast 2 days prior to appt.

## 2017-07-04 NOTE — Progress Notes (Signed)
Patient goes for her CT Scan on 7/9 @ 4:50 p.m.  She is to follow up in our office on 7/12 for a 6 month follow up visit.  So, we can go over the results with her at that time.

## 2017-07-04 NOTE — Progress Notes (Signed)
Can you please check on the status of this order?  Patient has called on the status as well.

## 2017-07-17 ENCOUNTER — Ambulatory Visit
Admission: RE | Admit: 2017-07-17 | Discharge: 2017-07-17 | Disposition: A | Discharge status: Home / Self Care | Payer: Medicare Other | Source: Ambulatory Visit | Attending: Internal Medicine | Admitting: Internal Medicine

## 2017-07-17 ENCOUNTER — Other Ambulatory Visit: Payer: Medicare Other | Admitting: Internal Medicine

## 2017-07-17 DIAGNOSIS — N183 Chronic kidney disease, stage 3 unspecified: Secondary | ICD-10-CM

## 2017-07-17 DIAGNOSIS — E039 Hypothyroidism, unspecified: Secondary | ICD-10-CM | POA: Diagnosis not present

## 2017-07-17 DIAGNOSIS — K4090 Unilateral inguinal hernia, without obstruction or gangrene, not specified as recurrent: Secondary | ICD-10-CM | POA: Diagnosis not present

## 2017-07-17 LAB — BASIC METABOLIC PANEL
BUN/Creatinine Ratio: 14 (calc) (ref 6–22)
BUN: 15 mg/dL (ref 7–25)
CO2: 30 mmol/L (ref 20–32)
Calcium: 9.1 mg/dL (ref 8.6–10.4)
Chloride: 106 mmol/L (ref 98–110)
Creat: 1.08 mg/dL — ABNORMAL HIGH (ref 0.60–0.88)
GLUCOSE: 85 mg/dL (ref 65–139)
Potassium: 4.5 mmol/L (ref 3.5–5.3)
Sodium: 141 mmol/L (ref 135–146)

## 2017-07-17 LAB — TSH: TSH: 1.82 m[IU]/L (ref 0.40–4.50)

## 2017-07-17 MED ORDER — IOPAMIDOL (ISOVUE-300) INJECTION 61%
100.0000 mL | Freq: Once | INTRAVENOUS | Status: AC | PRN
  Administered 2017-07-17: 100 mL via INTRAVENOUS

## 2017-07-20 ENCOUNTER — Encounter: Payer: Self-pay | Admitting: Internal Medicine

## 2017-07-20 ENCOUNTER — Ambulatory Visit (INDEPENDENT_AMBULATORY_CARE_PROVIDER_SITE_OTHER): Payer: Medicare Other | Admitting: Internal Medicine

## 2017-07-20 VITALS — BP 120/78 | HR 79 | Temp 98.4°F | Ht 59.0 in | Wt 156.0 lb

## 2017-07-20 DIAGNOSIS — F329 Major depressive disorder, single episode, unspecified: Secondary | ICD-10-CM

## 2017-07-20 DIAGNOSIS — K4090 Unilateral inguinal hernia, without obstruction or gangrene, not specified as recurrent: Secondary | ICD-10-CM

## 2017-07-20 DIAGNOSIS — F419 Anxiety disorder, unspecified: Secondary | ICD-10-CM

## 2017-07-20 DIAGNOSIS — Z8709 Personal history of other diseases of the respiratory system: Secondary | ICD-10-CM

## 2017-07-20 DIAGNOSIS — E039 Hypothyroidism, unspecified: Secondary | ICD-10-CM

## 2017-07-20 DIAGNOSIS — N183 Chronic kidney disease, stage 3 unspecified: Secondary | ICD-10-CM

## 2017-07-20 DIAGNOSIS — F411 Generalized anxiety disorder: Secondary | ICD-10-CM | POA: Diagnosis not present

## 2017-07-20 DIAGNOSIS — I10 Essential (primary) hypertension: Secondary | ICD-10-CM

## 2017-07-20 DIAGNOSIS — K219 Gastro-esophageal reflux disease without esophagitis: Secondary | ICD-10-CM | POA: Diagnosis not present

## 2017-07-20 DIAGNOSIS — M1711 Unilateral primary osteoarthritis, right knee: Secondary | ICD-10-CM | POA: Diagnosis not present

## 2017-07-20 DIAGNOSIS — F32A Depression, unspecified: Secondary | ICD-10-CM

## 2017-07-20 NOTE — Progress Notes (Signed)
   Subjective:    Patient ID: Terri Bridges, female    DOB: 03-05-1934, 83 y.o.   MRN: [id number]  HPI 82 year old Female for 6 month follow up. Has left indirect inguinal hernia.  Does not have surgery just wanted to know what was wrong.  She has large hiatal hernia on CT. BMI 31.  Not really able to exercise.  Continue to watch diet.  Continues with right knee osteoarthritis and has postponed arthroplasty.  History of hypertension, hypothyroidism, allergic rhinitis, GE reflux, recurrent sinusitis, COPD, anxiety depression and osteoporosis.  TSH is stable at 1.82  Creatinine stable at 1.08.  Fasting glucose 85.  Potassium 4.5.    Review of Systems see above     Objective:   Physical Exam Skin warm and dry.  Nodes none.  Neck is supple.  No thyromegaly.  Chest clear.  No carotid bruits.  Cardiac exam regular rate and rhythm.  Extremities without pitting edema.  Affect is slightly depressed       Assessment & Plan:  Left inguinal hernia-does not want to have it corrected  Large hiatal hernia treated with PPI  GE reflux-treated with PPI  Hypertension- stable on current regimen  Hypothyroidism-TSH within normal limits.  Continue same dose of thyroid replacement.  COPD-has recurrent bouts of bronchitis and bronchospasm.  She will continue to use inhalers  Recurrent sinusitis has recurrent bouts of maxillary sinusitis-  Right knee osteoarthritis-has deferred surgery for now  Anxiety depression and situational stress with finances treated with SSRI  Osteoporosis-continue Boniva  Osteoarthritis of right knee-has elected to defer knee surgery at this time  History of left knee arthroplasty  Plan: Continue current medications and follow-up in 6 months

## 2017-08-05 DIAGNOSIS — K4090 Unilateral inguinal hernia, without obstruction or gangrene, not specified as recurrent: Secondary | ICD-10-CM | POA: Insufficient documentation

## 2017-08-05 DIAGNOSIS — M1711 Unilateral primary osteoarthritis, right knee: Secondary | ICD-10-CM | POA: Insufficient documentation

## 2017-08-05 NOTE — Patient Instructions (Signed)
You have a left inguinal hernia.  Patient has deferred surgery for this.  Continue same medications and follow-up in 6 months.

## 2017-08-24 ENCOUNTER — Telehealth: Payer: Self-pay | Admitting: Internal Medicine
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
## 2017-09-27 ENCOUNTER — Other Ambulatory Visit: Payer: Self-pay | Admitting: Internal Medicine

## 2017-10-05 ENCOUNTER — Other Ambulatory Visit: Payer: Self-pay | Admitting: Internal Medicine

## 2017-10-27 DIAGNOSIS — Z23 Encounter for immunization: Secondary | ICD-10-CM | POA: Diagnosis not present

## 2017-11-08 ENCOUNTER — Other Ambulatory Visit: Payer: Self-pay

## 2017-11-08 MED ORDER — IBANDRONATE SODIUM 150 MG PO TABS: 150.0000 mg | ORAL_TABLET | ORAL | 11 refills | Status: DC

## 2017-12-04 ENCOUNTER — Encounter: Payer: Self-pay | Admitting: Internal Medicine

## 2017-12-04 ENCOUNTER — Ambulatory Visit (INDEPENDENT_AMBULATORY_CARE_PROVIDER_SITE_OTHER): Payer: Medicare Other | Admitting: Internal Medicine

## 2017-12-04 VITALS — BP 130/80 | HR 78 | Temp 98.3°F | Ht 59.0 in | Wt 153.3 lb

## 2017-12-04 DIAGNOSIS — Z8709 Personal history of other diseases of the respiratory system: Secondary | ICD-10-CM

## 2017-12-04 DIAGNOSIS — J440 Chronic obstructive pulmonary disease with acute lower respiratory infection: Secondary | ICD-10-CM | POA: Diagnosis not present

## 2017-12-04 DIAGNOSIS — H109 Unspecified conjunctivitis: Secondary | ICD-10-CM

## 2017-12-04 DIAGNOSIS — J209 Acute bronchitis, unspecified: Secondary | ICD-10-CM

## 2017-12-04 MED ORDER — LEVOFLOXACIN 500 MG PO TABS: 500.0000 mg | ORAL_TABLET | Freq: Every day | ORAL | 0 refills | Status: DC

## 2017-12-04 MED ORDER — PREDNISONE 10 MG PO TABS: ORAL_TABLET | ORAL | 0 refills | Status: DC

## 2017-12-04 MED ORDER — HYDROCODONE-HOMATROPINE 5-1.5 MG/5ML PO SYRP: 5.0000 mL | ORAL_SOLUTION | Freq: Three times a day (TID) | ORAL | 0 refills | Status: DC | PRN

## 2017-12-04 MED ORDER — OFLOXACIN 0.3 % OP SOLN: OPHTHALMIC | 0 refills | Status: DC

## 2017-12-04 MED ORDER — CEFTRIAXONE SODIUM 1 G IJ SOLR
1.0000 g | Freq: Once | INTRAMUSCULAR | Status: AC
  Administered 2017-12-04: 1 g via INTRAMUSCULAR

## 2017-12-04 NOTE — Progress Notes (Signed)
   Subjective:    Patient ID: Terri Bridges, female    DOB: 14-Apr-1934, 83 y.o.   MRN: [id number]  HPI 83 year old Female in with 2 week history of cough and congestion that started after visiting a nursing home.  No fever or chills.  History of COPD and recurrent bouts of bronchitis.  Also is noticed red left eye with some drainage.  Could not get an appointment to see the eye doctor until February.    Review of Systems fatigue and malaise     Objective:   Physical Exam Skin warm and dry.  Nodes none.  Red conjunctiva left eye.  No drainage noted.  Pharynx slightly injected without exudate.  TMs chronically scarred.  Neck is supple.  Chest clear to auscultation without rales or wheezing.       Assessment & Plan:  Acute left conjunctivitis-likely bacterial  Acute lower respiratory infection  Plan: Rocephin 1 g IM.  Levaquin 500 mg daily for 10 days.  Prednisone 10 mg (#21) going from 60 mg to 0 mg over 7 days.  Ocuflox ophthalmic drops 2 drops left eye 4 times a day for 5 days.  Rest and drink plenty of fluids.  Hycodan 1 teaspoon p.o. every 8 hours as needed cough.

## 2017-12-05 NOTE — Patient Instructions (Signed)
Rocephin 1 g IM.  Levaquin 500 mg daily for 10 days.  Prednisone 10 mg (#21) going from 60 mg to 0 mg over 7 days and tapering fashion.  Ocuflox ophthalmic drops 2 drops in left eye 4 times a day for 5 days.  Hycodan 1 teaspoon p.o. every 8 hours as needed cough.  Rest and drink plenty of fluids.

## 2017-12-24 ENCOUNTER — Other Ambulatory Visit: Payer: Self-pay | Admitting: Internal Medicine

## 2018-01-24 ENCOUNTER — Other Ambulatory Visit: Payer: Medicare Other | Admitting: Internal Medicine

## 2018-01-24 DIAGNOSIS — M1712 Unilateral primary osteoarthritis, left knee: Secondary | ICD-10-CM

## 2018-01-24 DIAGNOSIS — F329 Major depressive disorder, single episode, unspecified: Secondary | ICD-10-CM

## 2018-01-24 DIAGNOSIS — N183 Chronic kidney disease, stage 3 unspecified: Secondary | ICD-10-CM

## 2018-01-24 DIAGNOSIS — G5600 Carpal tunnel syndrome, unspecified upper limb: Secondary | ICD-10-CM | POA: Diagnosis not present

## 2018-01-24 DIAGNOSIS — F419 Anxiety disorder, unspecified: Secondary | ICD-10-CM

## 2018-01-24 DIAGNOSIS — F411 Generalized anxiety disorder: Secondary | ICD-10-CM | POA: Diagnosis not present

## 2018-01-24 DIAGNOSIS — I10 Essential (primary) hypertension: Secondary | ICD-10-CM

## 2018-01-24 DIAGNOSIS — E039 Hypothyroidism, unspecified: Secondary | ICD-10-CM

## 2018-01-24 LAB — CBC WITH DIFFERENTIAL/PLATELET
ABSOLUTE MONOCYTES: 410 {cells}/uL (ref 200–950)
Basophils Absolute: 29 cells/uL (ref 0–200)
Basophils Relative: 0.7 %
Eosinophils Absolute: 209 cells/uL (ref 15–500)
Eosinophils Relative: 5.1 %
HCT: 40.1 % (ref 35.0–45.0)
Hemoglobin: 13.1 g/dL (ref 11.7–15.5)
Lymphs Abs: 1054 cells/uL (ref 850–3900)
MCH: 27.6 pg (ref 27.0–33.0)
MCHC: 32.7 g/dL (ref 32.0–36.0)
MCV: 84.4 fL (ref 80.0–100.0)
MONOS PCT: 10 %
MPV: 10.6 fL (ref 7.5–12.5)
NEUTROS PCT: 58.5 %
Neutro Abs: 2399 cells/uL (ref 1500–7800)
PLATELETS: 147 10*3/uL (ref 140–400)
RBC: 4.75 10*6/uL (ref 3.80–5.10)
RDW: 12.8 % (ref 11.0–15.0)
TOTAL LYMPHOCYTE: 25.7 %
WBC: 4.1 10*3/uL (ref 3.8–10.8)

## 2018-01-24 LAB — COMPLETE METABOLIC PANEL WITH GFR
AG RATIO: 1.9 (calc) (ref 1.0–2.5)
ALT: 11 U/L (ref 6–29)
AST: 17 U/L (ref 10–35)
Albumin: 3.9 g/dL (ref 3.6–5.1)
Alkaline phosphatase (APISO): 56 U/L (ref 33–130)
BUN/Creatinine Ratio: 19 (calc) (ref 6–22)
BUN: 19 mg/dL (ref 7–25)
CALCIUM: 8.9 mg/dL (ref 8.6–10.4)
CHLORIDE: 106 mmol/L (ref 98–110)
CO2: 30 mmol/L (ref 20–32)
Creat: 0.98 mg/dL — ABNORMAL HIGH (ref 0.60–0.88)
GFR, Est African American: 63 mL/min/{1.73_m2} (ref >=60)
GFR, Est Non African American: 54 mL/min/{1.73_m2} — ABNORMAL LOW (ref >=60)
Globulin: 2.1 g/dL (calc) (ref 1.9–3.7)
Glucose, Bld: 93 mg/dL (ref 65–99)
POTASSIUM: 4.4 mmol/L (ref 3.5–5.3)
Sodium: 143 mmol/L (ref 135–146)
Total Bilirubin: 0.6 mg/dL (ref 0.2–1.2)
Total Protein: 6 g/dL — ABNORMAL LOW (ref 6.1–8.1)

## 2018-01-24 LAB — TSH: TSH: 0.47 mIU/L (ref 0.40–4.50)

## 2018-01-24 LAB — LIPID PANEL
Cholesterol: 189 mg/dL (ref <200)
HDL: 69 mg/dL (ref >50)
LDL Cholesterol (Calc): 105 mg/dL (calc) — ABNORMAL HIGH
Non-HDL Cholesterol (Calc): 120 mg/dL (calc) (ref <130)
Total CHOL/HDL Ratio: 2.7 (calc) (ref <5.0)
Triglycerides: 64 mg/dL (ref <150)

## 2018-01-29 ENCOUNTER — Ambulatory Visit (INDEPENDENT_AMBULATORY_CARE_PROVIDER_SITE_OTHER): Payer: Medicare Other | Admitting: Internal Medicine

## 2018-01-29 ENCOUNTER — Encounter: Payer: Self-pay | Admitting: Internal Medicine

## 2018-01-29 VITALS — BP 120/78 | HR 74 | Temp 98.1°F | Ht 59.0 in | Wt 155.0 lb

## 2018-01-29 DIAGNOSIS — F411 Generalized anxiety disorder: Secondary | ICD-10-CM

## 2018-01-29 DIAGNOSIS — M810 Age-related osteoporosis without current pathological fracture: Secondary | ICD-10-CM | POA: Diagnosis not present

## 2018-01-29 DIAGNOSIS — J22 Unspecified acute lower respiratory infection: Secondary | ICD-10-CM

## 2018-01-29 DIAGNOSIS — J209 Acute bronchitis, unspecified: Secondary | ICD-10-CM

## 2018-01-29 DIAGNOSIS — J309 Allergic rhinitis, unspecified: Secondary | ICD-10-CM

## 2018-01-29 DIAGNOSIS — F324 Major depressive disorder, single episode, in partial remission: Secondary | ICD-10-CM

## 2018-01-29 DIAGNOSIS — Z8709 Personal history of other diseases of the respiratory system: Secondary | ICD-10-CM | POA: Diagnosis not present

## 2018-01-29 DIAGNOSIS — I10 Essential (primary) hypertension: Secondary | ICD-10-CM | POA: Diagnosis not present

## 2018-01-29 DIAGNOSIS — M1711 Unilateral primary osteoarthritis, right knee: Secondary | ICD-10-CM | POA: Diagnosis not present

## 2018-01-29 DIAGNOSIS — E039 Hypothyroidism, unspecified: Secondary | ICD-10-CM | POA: Diagnosis not present

## 2018-01-29 DIAGNOSIS — Z0000 Encounter for general adult medical examination without abnormal findings: Secondary | ICD-10-CM

## 2018-01-29 DIAGNOSIS — J440 Chronic obstructive pulmonary disease with acute lower respiratory infection: Secondary | ICD-10-CM | POA: Diagnosis not present

## 2018-01-29 DIAGNOSIS — J329 Chronic sinusitis, unspecified: Secondary | ICD-10-CM | POA: Diagnosis not present

## 2018-01-29 DIAGNOSIS — K219 Gastro-esophageal reflux disease without esophagitis: Secondary | ICD-10-CM | POA: Diagnosis not present

## 2018-01-29 MED ORDER — PREDNISONE 10 MG PO TABS: ORAL_TABLET | ORAL | 0 refills | Status: DC

## 2018-01-29 MED ORDER — LEVOFLOXACIN 500 MG PO TABS: 500.0000 mg | ORAL_TABLET | Freq: Every day | ORAL | 0 refills | Status: DC

## 2018-01-29 MED ORDER — HYDROCODONE-HOMATROPINE 5-1.5 MG/5ML PO SYRP: 5.0000 mL | ORAL_SOLUTION | Freq: Three times a day (TID) | ORAL | 0 refills | Status: DC | PRN

## 2018-01-29 NOTE — Progress Notes (Addendum)
Subjective:    Patient ID: Terri Bridges, female    DOB: June 19, 1934, 84 y.o.   MRN: [id number]  HPI 84 year old Female for medicare wellness, health maintenance exam and evaluation of medical issues.  Has another respiratory infection with nasal congestion, SOB and wheezing. Has to have nebulizer 3 times a day.  Will refer back to Pulmonary for evaluation.  Seems to have a lot of difficulty with bronchospasm having to use nebulizer frequently.  She is chronically depressed and has been depressed for years.  In the spring 2015 she had a total left knee arthroplasty by Dr. Ronnie Derby and 4 months later had revision of the surgery due to instability.  She was scheduled to have right knee arthroplasty but has postponed it.  History of hypertension, hypothyroidism, allergic rhinitis, GE reflux, recurrent sinusitis, COPD, anxiety depression and osteoporosis.  For details of sinus surgery in 1991 by Dr. Lorella Nimrod please see dictation January 21, 2015.  She subsequently suffered a periorbital abscess drained with right intranasal ethmoidectomy March 1993.  Also had left ethmoidectomy left sphenoidectomy transnasal frontal sinusotomy March 1993.  Had arthroscopic surgery of the right knee February 2004.  Bilateral grommet tubes placed February 2004.  Left cataract extraction 2013.  Right rotator cuff repair 2011.  In 2008 she had colonoscopy done by Dr. Oletta Lamas and upper GI endoscopy showed hiatal hernia.  History of asthma and allergic rhinitis treated with Dr. Neldon Mc.  Had exercise tolerance test to evaluate chest pain with Dr. Doreatha Lew November 27, 1988 and study was negative.  Dr. Verl Blalock saw her in 2005 for palpitations and placed her on Cardizem with improvement in blood pressure and palpitations.  She had cardiac cath at that time showing no significant coronary disease  And osteoporosis screening February 2013 at [facility] with T score in the left femoral neck of -2.5 and T score the lumbar  spine being -2.5.  Because of GE reflux it was not likely she would tolerate Fosamax.  She took Actonel for a while in 2002 but it was discontinued.  She has difficulty affording some of her medications and we have given her many samples throughout the years.  She is a former heavy smoker having smoked 1 or 2 packs of cigarettes daily for 17 years.  She quit smoking in 1993.  Family history: Father died at age 82 of an MI.  Mother died at age 8 of an MI.  Social history: She is divorced.  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  Prior to that she worked at [employer] and regular.  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  She does not consume alcohol.  In the summer 2014 her son suffered an arrest in her backyard and subsequently expired presumably of an MI.  He had a prior history of drug abuse.  Her granddaughter who has a leg deformity and has had multiple surgeries at [facility] in [place] moved out of patient's home home to the [place] area with a boyfriend and now has a Sport and exercise psychologist.      Review of Systems chronically fatigued and chronically dysthymic with anxiety depression     Objective:   Physical Exam Vitals signs reviewed.  Constitutional:      General: She is not in acute distress.    Appearance: She is obese. She is not diaphoretic.  HENT:     Head: Normocephalic and  atraumatic.     Right Ear: Tympanic membrane normal.     Left Ear: Tympanic membrane normal.     Nose: Nose normal.     Mouth/Throat:     Mouth: Mucous membranes are moist.     Pharynx: Oropharynx is clear.  Eyes:     General:        Right eye: No discharge.        Left eye: No discharge.     Conjunctiva/sclera: Conjunctivae normal.  Neck:     Musculoskeletal: Neck supple.  Cardiovascular:     Rate and Rhythm: Normal rate and regular rhythm.     Heart sounds: Normal heart sounds. No  murmur.  Pulmonary:     Effort: Pulmonary effort is normal. No respiratory distress.     Breath sounds: Normal breath sounds.     Comments: Scattered inspiratory wheezing Abdominal:     General: There is no distension.     Palpations: Abdomen is soft. There is no mass.     Tenderness: There is no abdominal tenderness. There is no guarding or rebound.     Hernia: No hernia is present.  Genitourinary:    Comments: Bimanual normal Musculoskeletal:        General: No deformity.     Right lower leg: No edema.     Left lower leg: No edema.  Lymphadenopathy:     Cervical: No cervical adenopathy.  Skin:    General: Skin is warm and dry.     Findings: No rash.  Neurological:     General: No focal deficit present.     Mental Status: She is alert and oriented to person, place, and time.     Cranial Nerves: No cranial nerve deficit.     Motor: No weakness.     Coordination: Coordination normal.  Psychiatric:        Mood and Affect: Mood normal.        Behavior: Behavior normal.        Thought Content: Thought content normal.        Judgment: Judgment normal.           Assessment & Plan:  Acute lower respiratory infection-take Levaquin 500 mg daily for 10 days, Hycodan sparingly for cough 1 teaspoon p.o. every 6 hours as needed cough.  Tapering course of prednisone.  Continue inhalers.  Pulmonary consultation.  Has not been seen by pulmonologist since 2017 and seems to be having more of these exacerbations with COPD and respiratory infections.  Essential hypertension-stable on current regimen  Hypothyroidism-TSH stable on thyroid replacement  Anxiety state-continue anxiety medication  GE reflux-continue PPI  History of recurrent sinusitis status post surgeries for sinusitis years ago  Depression treated with SSRI  Hearing loss  Status post total knee arthroplasty  Mild chronic kidney disease-creatinine is currently normal has been drinking more water  Left inguinal  hernia  COPD-has been using nebulizer 3 times a day  Plan: Pulmonary consultation regarding frequent pulmonary infections and use of nebulizer 3 times a day.  See above treatment for lower respiratory infection  Subjective:   Patient presents for Medicare Annual/Subsequent preventive examination.  Review Past Medical/Family/Social: See above   Risk Factors  Current exercise habits: Mostly sedentary  dietary issues discussed: Low-fat low carbohydrate  Cardiac risk factors: History of smoking, family history  Depression Screen  (Note: if answer to either of the following is "Yes", a more complete depression screening is indicated)   Over the past two weeks,  have you felt down, depressed or hopeless? No  Over the past two weeks, have you felt little interest or pleasure in doing things? No Have you lost interest or pleasure in daily life? No Do you often feel hopeless? No Do you cry easily over simple problems? No   Activities of Daily Living  In your present state of health, do you have any difficulty performing the following activities?:   Driving? No  Managing money? No  Feeding yourself? No  Getting from bed to chair? No  Climbing a flight of stairs? No  Preparing food and eating?: No  Bathing or showering? No  Getting dressed: No  Getting to the toilet? No  Using the toilet:No  Moving around from place to place: No  In the past year have you fallen or had a near fall?:No  Are you sexually active? No  Do you have more than one partner? No   Hearing Difficulties: No  Do you often ask people to speak up or repeat themselves?  Yes Do you experience ringing or noises in your ears?  Yes Do you have difficulty understanding soft or whispered voices?  Yes Do you feel that you have a problem with memory? No Do you often misplace items? No    Home Safety:  Do you have a smoke alarm at your residence? Yes Do you have grab bars in the bathroom?  Yes Do you have throw  rugs in your house?  None   Cognitive Testing  Alert? Yes Normal Appearance?Yes  Oriented to person? Yes Place? Yes  Time? Yes  Recall of three objects? Yes  Can perform simple calculations? Yes  Displays appropriate judgment?Yes  Can read the correct time from a watch face?Yes   List the Names of Other Physician/Practitioners you currently use:  See referral list for the physicians patient is currently seeing.     Review of Systems: See above   Objective:     General appearance: Appears stated age and mildly obese looks fatigued Head: Normocephalic, without obvious abnormality, atraumatic  Eyes: conj clear, EOMi PEERLA  Ears: normal TM's and external ear canals both ears  Nose: Nares normal. Septum midline. Mucosa normal. No drainage or sinus tenderness.  Throat: lips, mucosa, and tongue normal; teeth and gums normal  Neck: no adenopathy, no carotid bruit, no JVD, supple, symmetrical, trachea midline and thyroid not enlarged, symmetric, no tenderness/mass/nodules  No CVA tenderness.  Lungs: Scattered wheezing Breasts: normal appearance, no masses or tenderness Heart: regular rate and rhythm, S1, S2 normal, no murmur, click, rub or gallop  Abdomen: soft, non-tender; bowel sounds normal; no masses, no organomegaly  Musculoskeletal: ROM normal in all joints, no crepitus, no deformity, Normal muscle strengthen. Back  is symmetric, no curvature. Skin: Skin color, texture, turgor normal. No rashes or lesions  Lymph nodes: Cervical, supraclavicular, and axillary nodes normal.  Neurologic: CN 2 -12 Normal, Normal symmetric reflexes. Normal coordination and gait  Psych: Alert & Oriented x 3, Mood appear stable.    Assessment:    Annual wellness medicare exam   Plan:    During the course of the visit the patient was educated and counseled about appropriate screening and preventive services including:   Annual flu vaccine     Patient Instructions (the written plan) was  given to the patient.  Medicare Attestation  I have personally reviewed:  The patient's medical and social history  Their use of alcohol, tobacco or illicit drugs  Their current medications and supplements  The patient's functional ability including ADLs,fall risks, home safety risks, cognitive, and hearing and visual impairment  Diet and physical activities  Evidence for depression or mood disorders  The patient's weight, height, BMI, and visual acuity have been recorded in the chart. I have made referrals, counseling, and provided education to the patient based on review of the above and I have provided the patient with a written personalized care plan for preventive services.

## 2018-02-08 ENCOUNTER — Encounter: Payer: Self-pay | Admitting: Internal Medicine

## 2018-02-08 NOTE — Patient Instructions (Addendum)
Take Hycodan sparingly for cough.  Take Levaquin 500 mg daily for 10 days.  Tapering course of prednisone.  Continue inhalers.  Pulmonary consultation.  Continue other medications as previously prescribed.

## 2018-02-11 ENCOUNTER — Telehealth: Payer: Self-pay | Admitting: Internal Medicine

## 2018-02-11 NOTE — Telephone Encounter (Signed)
Called [facility] and spoke with Mechele Claude r/t referral that was put in back in January.  She provided an appointment with Dr. Valeta Harms for 02/13/18 @ 4pm.    Called patient to advise of this information and she said she cannot do this date because she has an eye appointment at 3pm.  Provided phone number so that she can call and reschedule this appointment.  Also provided new address for [facility].

## 2018-02-13 ENCOUNTER — Ambulatory Visit: Payer: Medicare Other | Admitting: Pulmonary Disease

## 2018-02-13 DIAGNOSIS — H52203 Unspecified astigmatism, bilateral: Secondary | ICD-10-CM | POA: Diagnosis not present

## 2018-02-13 DIAGNOSIS — H04123 Dry eye syndrome of bilateral lacrimal glands: Secondary | ICD-10-CM | POA: Diagnosis not present

## 2018-02-13 DIAGNOSIS — H16103 Unspecified superficial keratitis, bilateral: Secondary | ICD-10-CM | POA: Diagnosis not present

## 2018-02-13 DIAGNOSIS — H43813 Vitreous degeneration, bilateral: Secondary | ICD-10-CM | POA: Diagnosis not present

## 2018-02-14 ENCOUNTER — Ambulatory Visit: Payer: Medicare Other | Admitting: Pulmonary Disease

## 2018-02-26 ENCOUNTER — Encounter: Payer: Self-pay | Admitting: Pulmonary Disease

## 2018-02-26 ENCOUNTER — Ambulatory Visit (INDEPENDENT_AMBULATORY_CARE_PROVIDER_SITE_OTHER): Payer: Medicare Other | Admitting: Pulmonary Disease

## 2018-02-26 VITALS — BP 128/80 | HR 103 | Ht 61.0 in | Wt 151.4 lb

## 2018-02-26 DIAGNOSIS — J410 Simple chronic bronchitis: Secondary | ICD-10-CM

## 2018-02-26 DIAGNOSIS — J302 Other seasonal allergic rhinitis: Secondary | ICD-10-CM | POA: Diagnosis not present

## 2018-02-26 DIAGNOSIS — R05 Cough: Secondary | ICD-10-CM

## 2018-02-26 DIAGNOSIS — R0981 Nasal congestion: Secondary | ICD-10-CM

## 2018-02-26 DIAGNOSIS — K449 Diaphragmatic hernia without obstruction or gangrene: Secondary | ICD-10-CM

## 2018-02-26 DIAGNOSIS — R0789 Other chest pain: Secondary | ICD-10-CM

## 2018-02-26 DIAGNOSIS — K219 Gastro-esophageal reflux disease without esophagitis: Secondary | ICD-10-CM

## 2018-02-26 DIAGNOSIS — Z87891 Personal history of nicotine dependence: Secondary | ICD-10-CM

## 2018-02-26 DIAGNOSIS — R059 Cough, unspecified: Secondary | ICD-10-CM

## 2018-02-26 DIAGNOSIS — R062 Wheezing: Secondary | ICD-10-CM | POA: Diagnosis not present

## 2018-02-26 LAB — CBC WITH DIFFERENTIAL/PLATELET
BASOS PCT: 0.4 % (ref 0.0–3.0)
Basophils Absolute: 0 10*3/uL (ref 0.0–0.1)
EOS ABS: 0.1 10*3/uL (ref 0.0–0.7)
Eosinophils Relative: 1.2 % (ref 0.0–5.0)
HCT: 39.8 % (ref 36.0–46.0)
HEMOGLOBIN: 13.1 g/dL (ref 12.0–15.0)
Lymphocytes Relative: 22.6 % (ref 12.0–46.0)
Lymphs Abs: 1.1 10*3/uL (ref 0.7–4.0)
MCHC: 33 g/dL (ref 30.0–36.0)
MCV: 82.5 fl (ref 78.0–100.0)
Monocytes Absolute: 0.5 10*3/uL (ref 0.1–1.0)
Monocytes Relative: 10.5 % (ref 3.0–12.0)
Neutro Abs: 3.2 10*3/uL (ref 1.4–7.7)
Neutrophils Relative %: 65.3 % (ref 43.0–77.0)
Platelets: 144 10*3/uL — ABNORMAL LOW (ref 150.0–400.0)
RBC: 4.83 Mil/uL (ref 3.87–5.11)
RDW: 14.1 % (ref 11.5–15.5)
WBC: 4.8 10*3/uL (ref 4.0–10.5)

## 2018-02-26 MED ORDER — PREDNISONE 10 MG PO TABS: ORAL_TABLET | ORAL | 0 refills | Status: DC

## 2018-02-26 MED ORDER — BUDESONIDE-FORMOTEROL FUMARATE 160-4.5 MCG/ACT IN AERO: 2.0000 | INHALATION_SPRAY | Freq: Two times a day (BID) | RESPIRATORY_TRACT | 0 refills | Status: DC

## 2018-02-26 MED ORDER — AEROCHAMBER MV MISC: 0 refills | Status: DC

## 2018-02-26 NOTE — Patient Instructions (Addendum)
Thank you for visiting Dr. Valeta Harms at [facility]. Today we recommend the following: Orders Placed This Encounter  Procedures  . CBC w/Diff  . Resp Allergy Profile [id number] [place] MD [facility]   Meds ordered this encounter  Medications  . budesonide-formoterol (SYMBICORT) 160-4.5 MCG/ACT inhaler    Sig: Inhale 2 puffs into the lungs every 12 (twelve) hours.    Dispense:  1 Inhaler    Refill:  0    Order Specific Question:   Lot Number?    Answer:   [id number]    Order Specific Question:   Expiration Date?    Answer:   02/27/2019    Order Specific Question:   Manufacturer?    Answer:   [employer] [[id number]]    Order Specific Question:   Quantity    Answer:   1  . Spacer/Aero-Holding Chambers (AEROCHAMBER MV) inhaler    Sig: Use as instructed    Dispense:  1 each    Refill:  0  . predniSONE (DELTASONE) 10 MG tablet    Sig: Take 4 tabs by mouth once daily x4 days, then 3 tabs x4 days, 2 tabs x4 days, 1 tab x4 days and stop.    Dispense:  40 tablet    Refill:  0   Return in about 4 weeks (around 03/26/2018). with APP or myself.

## 2018-02-26 NOTE — Progress Notes (Signed)
Synopsis: Referred in Feb 2020 for chronic cough, former patient of Dr. Ashok Cordia, PCP: Elby Showers, MD  Subjective:   PATIENT ID: Terri Bridges GENDER: female DOB: 01/29/1934, MRN: [id number]  Chief Complaint  Patient presents with  . Follow-up    Former Dr. Ashok Cordia patient, states she has had 2 rounds of antiobiotics for URI and her PCP though she needed to be seen. States her chest feels heavy and tight more on the left side. States she ia having increased SOB and wheezing.     C/o today of ongoing bronchitis symptoms. She has had 4 sinus surgeries in the past. She stopped in march 1993 smoking, <1 ppd for 20+ years. The sinuses were an issue for sometime and she states that they were having trouble draining them. She has had ongoing cough for several years. She has hiatal hernia and GERD, she taking PPI, omeprazole. She had pfts in the past with no evidence of obstruction. Spring time seasonal allergies. She has hay fever symptoms, trouble with perfumes. She wheezes routinely at night time. Cold air and wind makes her DOE and SOB much worse. No eczema as a kid. No admissions to the hospital or visits to ER recently for respiratory complaints. Currently taking symbicort and albuterol nebs. She feels like she has to use her nebulizer every night. She has noticed that the albuterol makes her anxious and shaky. She currently uses it 3 times per day.     Past Medical History:  Diagnosis Date  . Allergic rhinitis   . Allergy    uses Flonase daily as needed  . Anxiety    takes Xanax daily as needed  . Arthritis    "back" (04/21/2013)  . Chronic lower back pain   . Constipation    takes an OTC stool softener  . Depression    takes Zoloft daily  . Dry eyes    uses eye drops  . Exertional shortness of breath   . GERD (gastroesophageal reflux disease)    takes Omeprazole daily and Protonix daily as needed  . H/O hiatal hernia   . Headache(784.0)    sinus  . Heart murmur    years ago   . History of bronchitis    last time many yrs ago  . Hypertension    takes Losartan and Cardizem daily  . Hypothyroidism    takes Synthroid daily  . Joint pain   . Joint swelling   . Nocturia   . Osteoporosis   . Pressure in chest 08/23/13   fell and started having chest pressure, will have ECHO and stress test 09/04/13 before surgery  . Ringing in ears    sees Dr.Byers for this  . Scoliosis   . Urinary frequency   . Urinary incontinence   . Urinary urgency      Family History  Problem Relation Age of Onset  . Heart failure Mother   . Heart failure Father   . Hypertension Sister   . Neuropathy Neg Hx      Past Surgical History:  Procedure Laterality Date  . CARDIAC CATHETERIZATION  2005  . CATARACT EXTRACTION W/ INTRAOCULAR LENS  IMPLANT, BILATERAL Bilateral   . COLONOSCOPY    . ESOPHAGOGASTRODUODENOSCOPY    . EXCISIONAL TOTAL KNEE ARTHROPLASTY Left 09/08/2013   Procedure: EXCISIONAL TOTAL KNEE ARTHROPLASTY POLYEXCHANGE;  Surgeon: Vickey Huger, MD;  Location: [facility];  Service: Orthopedics;  Laterality: Left;  . EYE SURGERY    . JOINT REPLACEMENT    .  KNEE ARTHROSCOPY Right   . NASAL SINUS SURGERY     x 4  . SHOULDER ARTHROSCOPY W/ ROTATOR CUFF REPAIR Right   . TONSILLECTOMY  1940's  . TOTAL KNEE ARTHROPLASTY Left 04/21/2013  . TOTAL KNEE ARTHROPLASTY Left 04/21/2013   Procedure: TOTAL KNEE ARTHROPLASTY;  Surgeon: Vickey Huger, MD;  Location: [facility];  Service: Orthopedics;  Laterality: Left;  . TUBAL LIGATION      Social History   Socioeconomic History  . Marital status: Single    Spouse name: Not on file  . Number of children: 6  . Years of education: 84  . Highest education level: Not on file  Occupational History  . Occupation: Retired    Fish farm manager: [employer]    Comment: also caregiver  Social Needs  . Financial resource strain: Not on file  . Food insecurity:    Worry: Not on file    Inability: Not on file  . Transportation needs:    Medical: Not on file     Non-medical: Not on file  Tobacco Use  . Smoking status: Former Smoker    Packs/day: 1.00    Years: 10.00    Pack years: 10.00    Types: Cigarettes    Last attempt to quit: 03/30/1991    Years since quitting: 26.9  . Smokeless tobacco: Never Used  . Tobacco comment: quit smoking in Mar 1993  Substance and Sexual Activity  . Alcohol use: No    Alcohol/week: 0.0 standard drinks  . Drug use: No  . Sexual activity: Never    Birth control/protection: Post-menopausal  Lifestyle  . Physical activity:    Days per week: Not on file    Minutes per session: Not on file  . Stress: Not on file  Relationships  . Social connections:    Talks on phone: Not on file    Gets together: Not on file    Attends religious service: Not on file    Active member of club or organization: Not on file    Attends meetings of clubs or organizations: Not on file    Relationship status: Not on file  . Intimate partner violence:    Fear of current or ex partner: Not on file    Emotionally abused: Not on file    Physically abused: Not on file    Forced sexual activity: Not on file  Other Topics Concern  . Not on file  Social History Narrative   Originally from [place].    Previously worked in Engineer, mining for [employer]    Has 2 dogs currently.    Remote exposure to parrots in her current home.    No known mold exposure.    Remote travel to [place].    Has 1 indoor plant.    Caffeine use: No soda   2 cups coffee every morning     No Known Allergies   Outpatient Medications Prior to Visit  Medication Sig Dispense Refill  . albuterol (PROVENTIL HFA;VENTOLIN HFA) 108 (90 BASE) MCG/ACT inhaler Inhale 2 puffs into the lungs every 6 (six) hours as needed for wheezing or shortness of breath. 1 Inhaler 3  . albuterol (PROVENTIL) (2.5 MG/3ML) 0.083% nebulizer solution USE 1 VIAL IN NEBULIZER EVERY 6 HOURS AS NEEDED FOR WHEEZING OR SHORTNESS OF BREATH 150 mL prn  . amLODipine (NORVASC) 5 MG tablet TAKE 1 TABLET BY  MOUTH ONCE DAILY 30 tablet 11  . budesonide-formoterol (SYMBICORT) 160-4.5 MCG/ACT inhaler Inhale 2 puffs into the  lungs 3 times/day as needed-between meals & bedtime.    . Calcium-Vitamin D (CALTRATE 600 PLUS-VIT D PO) Take 1 tablet by mouth daily.    . furosemide (LASIX) 20 MG tablet TAKE 1 TABLET BY MOUTH ONCE DAILY 90 tablet 1  . HYDROcodone-homatropine (HYCODAN) 5-1.5 MG/5ML syrup Take 5 mLs by mouth every 8 (eight) hours as needed for cough. 120 mL 0  . ibandronate (BONIVA) 150 MG tablet Take 1 tablet (150 mg total) by mouth every 30 (thirty) days. Take in the morning with a full glass of water, on an empty stomach, and do not take anything else by mouth or lie down for the next 30 min. 1 tablet 11  . levothyroxine (SYNTHROID, LEVOTHROID) 100 MCG tablet TAKE 1 TABLET BY MOUTH ONCE DAILY 90 tablet 0  . losartan (COZAAR) 100 MG tablet TAKE ONE TABLET BY MOUTH ONCE DAILY. 90 tablet 3  . Omega-3 Fatty Acids (FISH OIL PO) Take 1 capsule by mouth daily.    Marland Kitchen omeprazole (PRILOSEC) 20 MG capsule TAKE 1 CAPSULE BY MOUTH ONCE DAILY 90 capsule 3  . levofloxacin (LEVAQUIN) 500 MG tablet Take 1 tablet (500 mg total) by mouth daily. 10 tablet 0  . predniSONE (DELTASONE) 10 MG tablet Take in tapering course as directed 6-5-4-3-2-1 21 tablet 0  . sertraline (ZOLOFT) 100 MG tablet TAKE ONE TABLET BY MOUTH ONCE DAILY 90 tablet 3   No facility-administered medications prior to visit.     Review of Systems  Constitutional: Negative for chills, fever, malaise/fatigue and weight loss.  HENT: Positive for congestion. Negative for hearing loss, sore throat and tinnitus.   Eyes: Negative for blurred vision and double vision.  Respiratory: Positive for cough, shortness of breath and wheezing. Negative for hemoptysis, sputum production and stridor.   Cardiovascular: Negative for chest pain, palpitations, orthopnea, leg swelling and PND.  Gastrointestinal: Negative for abdominal pain, constipation, diarrhea,  heartburn, nausea and vomiting.  Genitourinary: Negative for dysuria, hematuria and urgency.  Musculoskeletal: Negative for joint pain and myalgias.  Skin: Negative for itching and rash.  Neurological: Negative for dizziness, tingling, weakness and headaches.  Endo/Heme/Allergies: Negative for environmental allergies. Does not bruise/bleed easily.  Psychiatric/Behavioral: Negative for depression. The patient is not nervous/anxious and does not have insomnia.   All other systems reviewed and are negative.    Objective:  Physical Exam Vitals signs reviewed.  Constitutional:      General: She is not in acute distress.    Appearance: She is well-developed.  HENT:     Head: Normocephalic and atraumatic.     Mouth/Throat:     Mouth: Mucous membranes are moist.     Pharynx: Oropharyngeal exudate (on tongue ) present.  Eyes:     General: No scleral icterus.    Conjunctiva/sclera: Conjunctivae normal.     Pupils: Pupils are equal, round, and reactive to light.  Neck:     Musculoskeletal: Neck supple.     Vascular: No JVD.     Trachea: No tracheal deviation.  Cardiovascular:     Rate and Rhythm: Normal rate and regular rhythm.     Heart sounds: Normal heart sounds. No murmur.  Pulmonary:     Effort: Pulmonary effort is normal. No tachypnea, accessory muscle usage or respiratory distress.     Breath sounds: Normal breath sounds. No stridor. No wheezing, rhonchi or rales.  Abdominal:     General: Bowel sounds are normal. There is no distension.     Palpations: Abdomen is soft.  Tenderness: There is no abdominal tenderness.  Musculoskeletal:        General: No tenderness.     Comments: Thoracic Kyphosis    Lymphadenopathy:     Cervical: No cervical adenopathy.  Skin:    General: Skin is warm and dry.     Capillary Refill: Capillary refill takes less than 2 seconds.     Findings: No rash.  Neurological:     Mental Status: She is alert and oriented to person, place, and time.    Psychiatric:        Behavior: Behavior normal.      Vitals:   02/26/18 0910  BP: 128/80  Pulse: (!) 103  SpO2: 99%  Weight: 151 lb 6.4 oz (68.7 kg)  Height: 5\' 1"  (1.549 m)   99% on RA BMI Readings from Last 3 Encounters:  02/26/18 28.61 kg/m  01/29/18 31.31 kg/m  12/04/17 30.97 kg/m   Wt Readings from Last 3 Encounters:  02/26/18 151 lb 6.4 oz (68.7 kg)  01/29/18 155 lb (70.3 kg)  12/04/17 153 lb 5 oz (69.5 kg)    CBC    Component Value Date/Time   WBC 4.1 01/24/2018 0921   RBC 4.75 01/24/2018 0921   HGB 13.1 01/24/2018 0921   HCT 40.1 01/24/2018 0921   PLT 147 01/24/2018 0921   MCV 84.4 01/24/2018 0921   MCH 27.6 01/24/2018 0921   MCHC 32.7 01/24/2018 0921   RDW 12.8 01/24/2018 0921   LYMPHSABS 1,054 01/24/2018 0921   MONOABS 360 04/27/2016 1016   EOSABS 209 01/24/2018 0921   BASOSABS 29 01/24/2018 0921    Chest Imaging: 01/31/2016: CT chest Ascending thoracic aneurysm 3.8 cm, large hiatal hernia, granuloma posterior aspect of the right upper lobe major fissure.  Aberrant right subclavian artery. The patient's images have been independently reviewed by me.    Pulmonary Functions Testing Results: PFT Results Latest Ref Rng & Units 07/29/2015  FVC-Pre L 2.15  FVC-Predicted Pre % 98  FVC-Post L 2.07  FVC-Predicted Post % 95  Pre FEV1/FVC % % 73  Post FEV1/FCV % % 71  FEV1-Pre L 1.57  FEV1-Predicted Pre % 97  FEV1-Post L 1.47  DLCO UNC% % 94  DLCO COR %Predicted % 102  TLC L 4.50  TLC % Predicted % 101  RV % Predicted % 112     FeNO: No results found for: NITRICOXIDE  Pathology: None   Echocardiogram:  2015 Study Conclusions - Left ventricle: The cavity size was normal. Systolic function was normal. The estimated ejection fraction was in the range of 55% to 60%.  Heart Catheterization: None     Assessment & Plan:   Cough - Plan: CBC w/Diff, Resp Allergy Profile Regn2DC DE MD [facility]  Hiatal hernia  Gastroesophageal reflux  disease, esophagitis presence not specified  Simple chronic bronchitis (HCC) - Plan: CBC w/Diff, Resp Allergy Profile Regn2DC DE MD [facility]  Seasonal allergies  Sinus congestion  Wheezing  Chest tightness  Discussion:  This is an 84 year old female with ongoing cough.  Prior history of chronic cough that was presumed related to hiatal hernia and gastroesophageal reflux disease.  She has significant seasonal allergies, recurrent sinus congestion.  Prior sinus surgeries.  She has complaints today of nocturnal wheezing and chest tightness.  Her symptoms are also intermittent and episodic in nature.  Nighttime seems to be the worst of her respiratory complaints.  Overall her symptomatology could be consistent with persistent asthma symptoms.  She does have a remote  history of smoking and quit in 1993.  Patient's prior pulmonary function test in 2017 with no evidence of obstruction and does not have COPD at this time.  However repeating pulmonary function tests could prove a degree of mild COPD but her nocturnal symptoms and episodic presentation would be more consistent with an asthma diagnosis.  We will obtain the following: Regional allergy panel as well as IgE levels.  CBC with differential and absolute eosinophil count. Patient should continue her PPI for management of her esophageal reflux disease. Patient can to continue the use of albuterol nebulizer for shortness of breath and wheezing. Patient needs to initiate the use of Symbicort 160, 2 puffs twice daily with spacer and use this regularly.  The patient was counseled on the use of this medication daily for the improvement of her symptoms.  She is still having persistent cough and chest tightness.  Therefore we will start her on a prolonged prednisone taper 40 mg daily x4 days decrease by 10 mg every 4 days and then stop.  Patient to follow-up with [place] in clinic in 4 weeks to see how her symptoms are.  She can see either myself or 1 of our  APP's.  Greater than 50% of this patient's 40-minute office visit was been face-to-face discussing above recommendations and treatment plan.  As well as reviewing imaging and PFTs available in epic.   Current Outpatient Medications:  .  albuterol (PROVENTIL HFA;VENTOLIN HFA) 108 (90 BASE) MCG/ACT inhaler, Inhale 2 puffs into the lungs every 6 (six) hours as needed for wheezing or shortness of breath., Disp: 1 Inhaler, Rfl: 3 .  albuterol (PROVENTIL) (2.5 MG/3ML) 0.083% nebulizer solution, USE 1 VIAL IN NEBULIZER EVERY 6 HOURS AS NEEDED FOR WHEEZING OR SHORTNESS OF BREATH, Disp: 150 mL, Rfl: prn .  amLODipine (NORVASC) 5 MG tablet, TAKE 1 TABLET BY MOUTH ONCE DAILY, Disp: 30 tablet, Rfl: 11 .  budesonide-formoterol (SYMBICORT) 160-4.5 MCG/ACT inhaler, Inhale 2 puffs into the lungs 3 times/day as needed-between meals & bedtime., Disp: , Rfl:  .  Calcium-Vitamin D (CALTRATE 600 PLUS-VIT D PO), Take 1 tablet by mouth daily., Disp: , Rfl:  .  furosemide (LASIX) 20 MG tablet, TAKE 1 TABLET BY MOUTH ONCE DAILY, Disp: 90 tablet, Rfl: 1 .  HYDROcodone-homatropine (HYCODAN) 5-1.5 MG/5ML syrup, Take 5 mLs by mouth every 8 (eight) hours as needed for cough., Disp: 120 mL, Rfl: 0 .  ibandronate (BONIVA) 150 MG tablet, Take 1 tablet (150 mg total) by mouth every 30 (thirty) days. Take in the morning with a full glass of water, on an empty stomach, and do not take anything else by mouth or lie down for the next 30 min., Disp: 1 tablet, Rfl: 11 .  levothyroxine (SYNTHROID, LEVOTHROID) 100 MCG tablet, TAKE 1 TABLET BY MOUTH ONCE DAILY, Disp: 90 tablet, Rfl: 0 .  losartan (COZAAR) 100 MG tablet, TAKE ONE TABLET BY MOUTH ONCE DAILY., Disp: 90 tablet, Rfl: 3 .  Omega-3 Fatty Acids (FISH OIL PO), Take 1 capsule by mouth daily., Disp: , Rfl:  .  omeprazole (PRILOSEC) 20 MG capsule, TAKE 1 CAPSULE BY MOUTH ONCE DAILY, Disp: 90 capsule, Rfl: 3   Garner Nash, DO [facility] 02/26/2018 9:17 AM

## 2018-02-27 LAB — RESPIRATORY ALLERGY PROFILE REGION II ~~LOC~~
Allergen, A. alternata, m6: <0.1 kU/L
Allergen, Cedar tree, t12: <0.1 kU/L
Allergen, Comm Silver Birch, t9: <0.1 kU/L
Allergen, D pternoyssinus,d7: <0.1 kU/L
Allergen, Mouse Urine Protein, e78: <0.1 kU/L
Allergen, Mulberry, t76: <0.1 kU/L
Allergen, P. notatum, m1: <0.1 kU/L
Aspergillus fumigatus, m3: <0.1 kU/L
Bermuda Grass: <0.1 kU/L
Box Elder IgE: <0.1 kU/L
CLADOSPORIUM HERBARUM (M2) IGE: <0.1 kU/L
CLASS: 0
CLASS: 0
CLASS: 0
COMMON RAGWEED (SHORT) (W1) IGE: <0.1 kU/L
Cat Dander: <0.1 kU/L
Class: 0
Class: 0
Class: 0
Class: 0
Class: 0
Class: 0
Class: 0
Class: 0
Class: 0
Class: 0
Class: 0
Class: 0
Class: 0
Class: 0
Class: 0
Class: 0
Class: 0
Class: 0
Class: 0
Class: 0
Class: 0
D. farinae: <0.1 kU/L
Dog Dander: <0.1 kU/L
Elm IgE: <0.1 kU/L
IgE (Immunoglobulin E), Serum: 18 kU/L (ref <=114)
Johnson Grass: <0.1 kU/L
Pecan/Hickory Tree IgE: <0.1 kU/L
Rough Pigweed  IgE: <0.1 kU/L
Sheep Sorrel IgE: <0.1 kU/L
Timothy Grass: <0.1 kU/L

## 2018-02-27 LAB — INTERPRETATION:

## 2018-03-01 ENCOUNTER — Telehealth: Payer: Self-pay | Admitting: Pulmonary Disease

## 2018-03-01 NOTE — Telephone Encounter (Signed)
Called and spoke with Patient.  Dr Valeta Harms results and recommendations given.  Understanding stated.  Nothing further at this time.

## 2018-03-05 ENCOUNTER — Other Ambulatory Visit: Payer: Self-pay | Admitting: Internal Medicine

## 2018-03-23 ENCOUNTER — Other Ambulatory Visit: Payer: Self-pay | Admitting: Internal Medicine

## 2018-03-26 ENCOUNTER — Telehealth: Payer: Self-pay | Admitting: Primary Care

## 2018-03-26 ENCOUNTER — Other Ambulatory Visit: Payer: Self-pay

## 2018-03-26 ENCOUNTER — Ambulatory Visit (INDEPENDENT_AMBULATORY_CARE_PROVIDER_SITE_OTHER): Payer: Medicare Other | Admitting: Primary Care

## 2018-03-26 ENCOUNTER — Ambulatory Visit: Payer: Medicare Other | Admitting: Pulmonary Disease

## 2018-03-26 ENCOUNTER — Encounter: Payer: Self-pay | Admitting: Primary Care

## 2018-03-26 VITALS — BP 124/78 | HR 96 | Temp 98.0°F | Ht 61.0 in | Wt 154.8 lb

## 2018-03-26 DIAGNOSIS — J45909 Unspecified asthma, uncomplicated: Secondary | ICD-10-CM | POA: Insufficient documentation

## 2018-03-26 DIAGNOSIS — J45998 Other asthma: Secondary | ICD-10-CM

## 2018-03-26 MED ORDER — BUDESONIDE-FORMOTEROL FUMARATE 160-4.5 MCG/ACT IN AERO: 2.0000 | INHALATION_SPRAY | Freq: Two times a day (BID) | RESPIRATORY_TRACT | 3 refills | Status: DC | PRN

## 2018-03-26 NOTE — Telephone Encounter (Signed)
Called and spoke with patient, she stated that symbicort is too expensive for her. She requested that we send in something cheaper. Advised patient to obtain her formulary so that we can determine what is best to call in. Patient will get this and call us back.

## 2018-03-26 NOTE — Progress Notes (Signed)
@Patient  ID: Terri Bridges, female    DOB: Mar 12, 1934, 84 y.o.   MRN: [id number]  Chief Complaint  Patient presents with   Follow-up    SOB with exertion, occasional cough    Referring provider: Elby Showers, MD  HPI: 84 year old female, former remote smoker. PMH significant for chronic cough. Patient of Dr. Valeta Harms, last seen on 02/26/18. She was started on Symbicort 160 and given prolonged prednisone taper for her symptoms. Maintained on PPI. CBC with diff and allergy panel were normal.   Previous [facility] Encounter 02/26/18 - Initial Consult with Dr. Valeta Harms Her symptoms are also intermittent and episodic in nature.  Nighttime seems to be the worst of her respiratory complaints.  Overall her symptomatology could be consistent with persistent asthma symptoms.  She does have a remote history of smoking and quit in 1993.  Patient's prior pulmonary function test in 2017 with no evidence of obstruction and does not have COPD at this time.  03/26/2018 Patient presents today for 4 week follow-up. States that he cough is much better. Using Symbicort with spacer. Washing her mouth afterwards. Confused if there is still medication in inhaler or not. She has backed off cough medication. Some shortness of breath with exertion but states that this is her baseline. She is able to walk her dogs, recovers with rest and sometimes uses her nebulizer after. Denies fever, chills, wheezing.    No Known Allergies  Immunization History  Administered Date(s) Administered   Influenza Split 10/20/2011   Influenza, High Dose Seasonal PF 10/27/2015, 10/27/2017   Influenza,inj,Quad PF,6+ Mos 09/12/2013, 10/13/2016   Pneumococcal Conjugate-13 01/21/2015   Pneumococcal Polysaccharide-23 05/11/2003   Tdap 05/11/2003, 03/13/2013    Past Medical History:  Diagnosis Date   Allergic rhinitis    Allergy    uses Flonase daily as needed   Anxiety    takes Xanax daily as needed   Arthritis    "back"  (04/21/2013)   Chronic lower back pain    Constipation    takes an OTC stool softener   Depression    takes Zoloft daily   Dry eyes    uses eye drops   Exertional shortness of breath    GERD (gastroesophageal reflux disease)    takes Omeprazole daily and Protonix daily as needed   H/O hiatal hernia    Headache(784.0)    sinus   Heart murmur    years ago   History of bronchitis    last time many yrs ago   Hypertension    takes Losartan and Cardizem daily   Hypothyroidism    takes Synthroid daily   Joint pain    Joint swelling    Nocturia    Osteoporosis    Pressure in chest 08/23/13   fell and started having chest pressure, will have ECHO and stress test 09/04/13 before surgery   Ringing in ears    sees Dr.Byers for this   Scoliosis    Urinary frequency    Urinary incontinence    Urinary urgency     Tobacco History: Social History   Tobacco Use  Smoking Status Former Smoker   Packs/day: 1.00   Years: 10.00   Pack years: 10.00   Types: Cigarettes   Last attempt to quit: 03/30/1991   Years since quitting: 27.0  Smokeless Tobacco Never Used  Tobacco Comment   quit smoking in Mar 1993   Counseling given: Not Answered Comment: quit smoking in Mar 1993   Outpatient  Medications Prior to Visit  Medication Sig Dispense Refill   albuterol (PROVENTIL HFA;VENTOLIN HFA) 108 (90 BASE) MCG/ACT inhaler Inhale 2 puffs into the lungs every 6 (six) hours as needed for wheezing or shortness of breath. 1 Inhaler 3   albuterol (PROVENTIL) (2.5 MG/3ML) 0.083% nebulizer solution USE 1 VIAL IN NEBULIZER EVERY 6 HOURS AS NEEDED FOR WHEEZING OR SHORTNESS OF BREATH 150 mL 0   amLODipine (NORVASC) 5 MG tablet TAKE 1 TABLET BY MOUTH ONCE DAILY 90 tablet 3   Calcium-Vitamin D (CALTRATE 600 PLUS-VIT D PO) Take 1 tablet by mouth daily.     furosemide (LASIX) 20 MG tablet TAKE 1 TABLET BY MOUTH ONCE DAILY 90 tablet 1   HYDROcodone-homatropine (HYCODAN) 5-1.5  MG/5ML syrup Take 5 mLs by mouth every 8 (eight) hours as needed for cough. 120 mL 0   ibandronate (BONIVA) 150 MG tablet Take 1 tablet (150 mg total) by mouth every 30 (thirty) days. Take in the morning with a full glass of water, on an empty stomach, and do not take anything else by mouth or lie down for the next 30 min. 1 tablet 11   levothyroxine (SYNTHROID, LEVOTHROID) 100 MCG tablet Take 1 tablet by mouth once daily 90 tablet 0   losartan (COZAAR) 100 MG tablet TAKE ONE TABLET BY MOUTH ONCE DAILY. 90 tablet 3   Omega-3 Fatty Acids (FISH OIL PO) Take 1 capsule by mouth daily.     omeprazole (PRILOSEC) 20 MG capsule TAKE 1 CAPSULE BY MOUTH ONCE DAILY 90 capsule 3   Spacer/Aero-Holding Chambers (AEROCHAMBER MV) inhaler Use as instructed 1 each 0   budesonide-formoterol (SYMBICORT) 160-4.5 MCG/ACT inhaler Inhale 2 puffs into the lungs 3 times/day as needed-between meals & bedtime.     budesonide-formoterol (SYMBICORT) 160-4.5 MCG/ACT inhaler Inhale 2 puffs into the lungs every 12 (twelve) hours. 1 Inhaler 0   predniSONE (DELTASONE) 10 MG tablet Take 4 tabs by mouth once daily x4 days, then 3 tabs x4 days, 2 tabs x4 days, 1 tab x4 days and stop. 40 tablet 0   No facility-administered medications prior to visit.     Review of Systems  Review of Systems  Constitutional: Negative.   HENT: Negative.   Respiratory: Positive for cough. Negative for shortness of breath and wheezing.   Cardiovascular: Negative.     Physical Exam  BP 124/78 (BP Location: Left Arm, Cuff Size: Normal)    Pulse 96    Temp 98 F (36.7 C)    Ht 5\' 1"  (1.549 m)    Wt 154 lb 12.8 oz (70.2 kg)    SpO2 98%    BMI 29.25 kg/m  Physical Exam Constitutional:      Appearance: Normal appearance.  HENT:     Right Ear: Tympanic membrane normal.     Left Ear: Tympanic membrane normal.     Mouth/Throat:     Mouth: Mucous membranes are moist.     Pharynx: Oropharynx is clear.  Neck:     Musculoskeletal: Normal  range of motion and neck supple.  Cardiovascular:     Rate and Rhythm: Normal rate and regular rhythm.  Pulmonary:     Effort: Pulmonary effort is normal.     Breath sounds: Normal breath sounds. No wheezing or rhonchi.  Musculoskeletal: Normal range of motion.  Skin:    General: Skin is warm and dry.  Neurological:     General: No focal deficit present.     Mental Status: She is alert and oriented  to person, place, and time. Mental status is at baseline.  Psychiatric:        Mood and Affect: Mood normal.        Behavior: Behavior normal.        Thought Content: Thought content normal.        Judgment: Judgment normal.      Lab Results:  CBC    Component Value Date/Time   WBC 4.8 02/26/2018 0949   RBC 4.83 02/26/2018 0949   HGB 13.1 02/26/2018 0949   HCT 39.8 02/26/2018 0949   PLT 144.0 (L) 02/26/2018 0949   MCV 82.5 02/26/2018 0949   MCH 27.6 01/24/2018 0921   MCHC 33.0 02/26/2018 0949   RDW 14.1 02/26/2018 0949   LYMPHSABS 1.1 02/26/2018 0949   MONOABS 0.5 02/26/2018 0949   EOSABS 0.1 02/26/2018 0949   BASOSABS 0.0 02/26/2018 0949    BMET    Component Value Date/Time   NA 143 01/24/2018 0921   K 4.4 01/24/2018 0921   CL 106 01/24/2018 0921   CO2 30 01/24/2018 0921   GLUCOSE 93 01/24/2018 0921   BUN 19 01/24/2018 0921   CREATININE 0.98 (H) 01/24/2018 0921   CALCIUM 8.9 01/24/2018 0921   GFRNONAA 54 (L) 01/24/2018 0921   GFRAA 63 01/24/2018 0921    BNP    Component Value Date/Time   BNP 32.0 06/03/2015 1700    ProBNP No results found for: PROBNP  Imaging: No results found.   Assessment & Plan:   Asthma - Symptoms consistent with persistent asthma, cough resolved with prednisone taper  - PFTs showed no obstruction - Continue Symbicort twice a day - Use Albuterol nebulizer every 6 hours for shortness of breath/wheezing  - FU in 3-4 months with Dr. Valeta Harms and then as needed      Martyn Ehrich, NP 03/26/2018

## 2018-03-26 NOTE — Assessment & Plan Note (Addendum)
-   Symptoms consistent with persistent asthma, cough resolved with prednisone taper  - PFTs showed no obstruction - Continue Symbicort twice a day - Use Albuterol nebulizer every 6 hours for shortness of breath/wheezing  - FU in 3-4 months with Dr. Valeta Harms and then as needed

## 2018-03-26 NOTE — Progress Notes (Signed)
PCCM: Agreed. Thanks for seeing.  [facility] 03/26/2018 3:24 PM

## 2018-03-26 NOTE — Patient Instructions (Signed)
Continue Symbicort twice a day Use Albuterol nebulizer every 6 hours for shortness of breath/wheezing    Follow up in 3-4 months with Dr. Valeta Harms or if symptoms return/worsen

## 2018-03-28 NOTE — Telephone Encounter (Signed)
ATC Patient.  LMTCB. 

## 2018-04-01 NOTE — Telephone Encounter (Signed)
Called and spoke with patient, she states that she did not have luck with Medicare. She advised if there was anyone that we could call for her to better help pt Verbalized that I would try calling medicare to get formulary sent to our office for review.  Called Medicare at phone [phone] the wait time was over 21mins hold time. Will try back later.

## 2018-04-02 NOTE — Telephone Encounter (Signed)
Attempted to call Medicare x4 times but each time I tried calling, I kept getting a busy tone. Will try to call back later.

## 2018-04-03 NOTE — Telephone Encounter (Signed)
LMTCB- pt needs to call and obtain her formulary

## 2018-04-04 ENCOUNTER — Ambulatory Visit (INDEPENDENT_AMBULATORY_CARE_PROVIDER_SITE_OTHER): Payer: Medicare Other | Admitting: Internal Medicine

## 2018-04-04 ENCOUNTER — Telehealth: Payer: Self-pay | Admitting: Internal Medicine

## 2018-04-04 ENCOUNTER — Other Ambulatory Visit: Payer: Self-pay

## 2018-04-04 ENCOUNTER — Encounter: Payer: Self-pay | Admitting: Internal Medicine

## 2018-04-04 VITALS — BP 120/70 | HR 82 | Temp 98.5°F | Ht 61.0 in | Wt 154.0 lb

## 2018-04-04 DIAGNOSIS — S81832A Puncture wound without foreign body, left lower leg, initial encounter: Secondary | ICD-10-CM

## 2018-04-04 DIAGNOSIS — L03116 Cellulitis of left lower limb: Secondary | ICD-10-CM | POA: Diagnosis not present

## 2018-04-04 DIAGNOSIS — L02416 Cutaneous abscess of left lower limb: Secondary | ICD-10-CM | POA: Diagnosis not present

## 2018-04-04 MED ORDER — CEFTRIAXONE SODIUM 1 G IJ SOLR
1.0000 g | Freq: Once | INTRAMUSCULAR | Status: AC
  Administered 2018-04-04: 1 g via INTRAMUSCULAR

## 2018-04-04 MED ORDER — MUPIROCIN 2 % EX OINT: TOPICAL_OINTMENT | CUTANEOUS | 0 refills | Status: DC

## 2018-04-04 MED ORDER — DOXYCYCLINE HYCLATE 100 MG PO TABS: 100.0000 mg | ORAL_TABLET | Freq: Two times a day (BID) | ORAL | 0 refills | Status: DC

## 2018-04-04 NOTE — Telephone Encounter (Signed)
Called and spoke with pt stating to her that she needs to call insurance company again to try to obtain a formulary list of covered meds. Stated to pt that we have tried but have not been able to get through and this is why we need her to try to call them again. Pt stated she would try to call to obtain list and then would call us back.

## 2018-04-04 NOTE — Telephone Encounter (Signed)
SCHEDULED

## 2018-04-04 NOTE — Progress Notes (Signed)
   Subjective:    Patient ID: Terri Bridges, female    DOB: 09/29/1934, 84 y.o.   MRN: [id number]  HPI 84 year old Female with puncture injury left lateral leg that occurred on Saturday, March 21 when she was working in her yard.  There was a stick that struck her left lateral leg and punctured it.  There is now erythema about the injury site and it is very sore.  She has been trying to take care of it locally with warm soapy water and staying off of the leg.    Review of Systems no fever or shaking chills.     Objective:   Physical Exam There is an approximate 1 cm puncture wound with 3 cm of surrounding erythema.  There is currently no drainage from the wound.  It is tender to touch and warm.       Assessment & Plan:  Puncture wound left lateral lower leg with surrounding erythema consistent with cellulitis  Plan: Rocephin 1 g IM.  Doxycycline 100 mg twice daily for 10 days.  Bactroban to puncture area twice daily.  Bathe area with warm soapy water twice daily.  Okay to apply warm hot compresses.  Follow-up on Monday, March 30 here.

## 2018-04-04 NOTE — Telephone Encounter (Signed)
Pt is calling back [phone]

## 2018-04-04 NOTE — Addendum Note (Signed)
Addended by: Mady Haagensen on: 04/04/2018 11:04 AM   Modules accepted: Orders

## 2018-04-04 NOTE — Telephone Encounter (Signed)
LMTCB x2 for pt 

## 2018-04-04 NOTE — Telephone Encounter (Signed)
She needs OV

## 2018-04-04 NOTE — Telephone Encounter (Signed)
Terri Bridges [phone]  Val was working in her yard over the weekend and stuck a stick in her leg, she pulled it out. The place does not seem to be getting any better. She has been putting peroxide and cream on it. She feels like there may be a piece of the stick still in there. What should she do.

## 2018-04-04 NOTE — Patient Instructions (Signed)
Doxycycline 100 mg twice daily for 10 days.  A injury site with warm soapy water twice daily and then apply Bactroban ointment.  Okay to use warm hot compresses 20 minutes twice daily.  Follow-up March 30.

## 2018-04-05 NOTE — Telephone Encounter (Signed)
Called and spoke with patient regarding if she has spoken with her insurance in need of her formulary Pt advised she was on hold for 31mins yesterday, and kept getting bounced around with no answer Advised patient that she can call the number of pharmacy on back of the insurance card may have better response She advised that she will call today and let us know

## 2018-04-05 NOTE — Telephone Encounter (Signed)
LVM for patient to return call regarding in need of her insurance formulary. X1

## 2018-04-05 NOTE — Telephone Encounter (Signed)
Pt retuning call

## 2018-04-08 ENCOUNTER — Ambulatory Visit (INDEPENDENT_AMBULATORY_CARE_PROVIDER_SITE_OTHER): Payer: Medicare Other | Admitting: Internal Medicine

## 2018-04-08 ENCOUNTER — Other Ambulatory Visit: Payer: Self-pay

## 2018-04-08 ENCOUNTER — Encounter: Payer: Self-pay | Admitting: Internal Medicine

## 2018-04-08 VITALS — BP 130/80 | HR 82 | Temp 98.5°F | Wt 155.0 lb

## 2018-04-08 DIAGNOSIS — S81832A Puncture wound without foreign body, left lower leg, initial encounter: Secondary | ICD-10-CM | POA: Diagnosis not present

## 2018-04-08 MED ORDER — DOXYCYCLINE HYCLATE 100 MG PO TABS: 100.0000 mg | ORAL_TABLET | Freq: Two times a day (BID) | ORAL | 0 refills | Status: DC

## 2018-04-08 NOTE — Progress Notes (Signed)
   Subjective:    Patient ID: Terri Bridges, female    DOB: Aug 14, 1934, 84 y.o.   MRN: [id number]  HPI In today to follow-up on infected puncture wound left lower leg.  The area has improved but is still tender and not completely healed.  She is on doxycycline.  She says she has been taking good care of it and keeping it dressed and applying Bactroban ointment.  No fever or chills.  It remains tender to touch.    Review of Systems see above     Objective:   Physical Exam  Approximately 12 mm infected puncture wound has improved but could not completely healed.  It is slightly erythematous.  Minimal surrounding erythema      Assessment & Plan:  Infected puncture wound left lower extremity-improving  Plan: Continue with doxycycline.  This is been refilled for an additional 10 days.  Continue local care.  She may check in with [place] by telephone due to the coronavirus outbreak.

## 2018-04-08 NOTE — Patient Instructions (Signed)
Doxycycline has been refilled for an additional 10 days.  Continue local care.  She may check in by telephone if she has questions or concerns.  It is likely to take an additional 2 to 3 weeks for leg wound to completely heal

## 2018-04-08 NOTE — Telephone Encounter (Signed)
LMTCB

## 2018-04-10 NOTE — Telephone Encounter (Signed)
Called patient unable to reach LMTCB 

## 2018-04-11 NOTE — Telephone Encounter (Signed)
Spoke with pt, she is frustrated because she still doesn't have the information she needed about her formulary after several attempts on the phone for over 40 minutes. She states she would call us back if she gets any information. She is going to call Dr. Renold Genta, her PCP to see what she can do. I explained to patient that we really need her formulary to decide which medications are cheaper than 300 per month. While searching on the Internet for a drug formulary and Symbicort is on the list for zero copay. Pt understood and will call us back with update.

## 2018-04-12 NOTE — Telephone Encounter (Signed)
lmom to follow up with the patient

## 2018-04-15 NOTE — Telephone Encounter (Signed)
lmom to follow up with the patient

## 2018-04-16 ENCOUNTER — Telehealth: Payer: Self-pay | Admitting: Internal Medicine

## 2018-04-16 DIAGNOSIS — Z889 Allergy status to unspecified drugs, medicaments and biological substances status: Secondary | ICD-10-CM | POA: Diagnosis not present

## 2018-04-16 NOTE — Telephone Encounter (Signed)
ATC pt, no answer. Left message for pt to call back.  Attempted to contact pt X 3 but no success. Will close encounter per triage protocol.

## 2018-04-16 NOTE — Telephone Encounter (Signed)
Stop Doxycycline. Label her allergic to Doxycycline. Take Benadryl.

## 2018-04-16 NOTE — Telephone Encounter (Signed)
Terri Bridges [phone]  [name] called to say she thinks she is having a reaction to the antibiotic, she went outside and then she broke out with a rash, burning and itching. She said it woke her up. It is on her face,elbows and hands. She stated that even if she walks by the window it burns.

## 2018-04-16 NOTE — Telephone Encounter (Signed)
Patient was notified she said her leg is looking better she is cleaning it twice a day and applying ointment on it. She will stop doxycycline and try benadryl.

## 2018-05-03 ENCOUNTER — Other Ambulatory Visit: Payer: Self-pay | Admitting: Internal Medicine

## 2018-05-14 ENCOUNTER — Other Ambulatory Visit: Payer: Self-pay | Admitting: Internal Medicine

## 2018-06-08 ENCOUNTER — Other Ambulatory Visit: Payer: Self-pay | Admitting: Internal Medicine

## 2018-06-25 DIAGNOSIS — M9903 Segmental and somatic dysfunction of lumbar region: Secondary | ICD-10-CM | POA: Diagnosis not present

## 2018-06-25 DIAGNOSIS — M545 Low back pain: Secondary | ICD-10-CM | POA: Diagnosis not present

## 2018-07-04 ENCOUNTER — Ambulatory Visit: Payer: Medicare Other | Admitting: Pulmonary Disease

## 2018-07-05 ENCOUNTER — Other Ambulatory Visit: Payer: Self-pay | Admitting: Internal Medicine

## 2018-07-26 ENCOUNTER — Encounter: Payer: Self-pay | Admitting: Pulmonary Disease

## 2018-08-01 ENCOUNTER — Other Ambulatory Visit: Payer: Self-pay | Admitting: Internal Medicine

## 2018-08-07 ENCOUNTER — Ambulatory Visit: Payer: Medicare Other | Admitting: Pulmonary Disease

## 2018-09-02 ENCOUNTER — Other Ambulatory Visit: Payer: Self-pay | Admitting: Internal Medicine

## 2018-09-04 DIAGNOSIS — S61411A Laceration without foreign body of right hand, initial encounter: Secondary | ICD-10-CM | POA: Diagnosis not present

## 2018-10-24 ENCOUNTER — Ambulatory Visit (INDEPENDENT_AMBULATORY_CARE_PROVIDER_SITE_OTHER): Payer: Medicare Other | Admitting: Pulmonary Disease

## 2018-10-24 ENCOUNTER — Encounter: Payer: Self-pay | Admitting: Pulmonary Disease

## 2018-10-24 ENCOUNTER — Other Ambulatory Visit: Payer: Self-pay

## 2018-10-24 VITALS — BP 126/88 | HR 74 | Ht 63.0 in | Wt 147.0 lb

## 2018-10-24 DIAGNOSIS — J302 Other seasonal allergic rhinitis: Secondary | ICD-10-CM

## 2018-10-24 DIAGNOSIS — Z87891 Personal history of nicotine dependence: Secondary | ICD-10-CM

## 2018-10-24 DIAGNOSIS — Z23 Encounter for immunization: Secondary | ICD-10-CM | POA: Diagnosis not present

## 2018-10-24 DIAGNOSIS — R0981 Nasal congestion: Secondary | ICD-10-CM

## 2018-10-24 DIAGNOSIS — R059 Cough, unspecified: Secondary | ICD-10-CM

## 2018-10-24 DIAGNOSIS — K449 Diaphragmatic hernia without obstruction or gangrene: Secondary | ICD-10-CM | POA: Diagnosis not present

## 2018-10-24 DIAGNOSIS — R05 Cough: Secondary | ICD-10-CM

## 2018-10-24 DIAGNOSIS — M419 Scoliosis, unspecified: Secondary | ICD-10-CM | POA: Diagnosis not present

## 2018-10-24 NOTE — Progress Notes (Signed)
Synopsis: Referred in Feb 2020 for chronic cough, former patient of Dr. Ashok Cordia, PCP: Elby Showers, MD  Subjective:   PATIENT ID: Terri Bridges GENDER: female DOB: Aug 11, 1934, MRN: [id number]  Chief Complaint  Patient presents with  . Follow-up    C/o today of ongoing bronchitis symptoms. She has had 4 sinus surgeries in the past. She stopped in march 1993 smoking, <1 ppd for 20+ years. The sinuses were an issue for sometime and she states that they were having trouble draining them. She has had ongoing cough for several years. She has hiatal hernia and GERD, she taking PPI, omeprazole. She had pfts in the past with no evidence of obstruction. Spring time seasonal allergies. She has hay fever symptoms, trouble with perfumes. She wheezes routinely at night time. Cold air and wind makes her DOE and SOB much worse. No eczema as a kid. No admissions to the hospital or visits to ER recently for respiratory complaints. Currently taking symbicort and albuterol nebs. She feels like she has to use her nebulizer every night. She has noticed that the albuterol makes her anxious and shaky. She currently uses it 3 times per day.    OV 10/24/2018: Patient seen today in follow-up for recurrent bronchitis type symptoms.  She has a 20+-pack-year history of smoking as stated above.  She does have seasonal allergies and trouble with perfumes and cold air.  Her symptoms right now are well controlled with just as needed albuterol nebulizer.  She is unable to afford the Symbicort.  She also seems to be very frustrated with her home equipment supplier.  Was also frustrated with the pharmacy.  I explained that we would not help her anyway we can with giving her samples of medications if she wanted them.  She states that she does not want to use an inhaler she is doing fine with her nebulizer and I encouraged her to continue to do so.  PFTs again reviewed with the patient today in the office.   Past Medical History:   Diagnosis Date  . Allergic rhinitis   . Allergy    uses Flonase daily as needed  . Anxiety    takes Xanax daily as needed  . Arthritis    "back" (04/21/2013)  . Chronic lower back pain   . Constipation    takes an OTC stool softener  . Depression    takes Zoloft daily  . Dry eyes    uses eye drops  . Exertional shortness of breath   . GERD (gastroesophageal reflux disease)    takes Omeprazole daily and Protonix daily as needed  . H/O hiatal hernia   . Headache(784.0)    sinus  . Heart murmur    years ago  . History of bronchitis    last time many yrs ago  . Hypertension    takes Losartan and Cardizem daily  . Hypothyroidism    takes Synthroid daily  . Joint pain   . Joint swelling   . Nocturia   . Osteoporosis   . Pressure in chest 08/23/13   fell and started having chest pressure, will have ECHO and stress test 09/04/13 before surgery  . Ringing in ears    sees Dr.Byers for this  . Scoliosis   . Urinary frequency   . Urinary incontinence   . Urinary urgency      Family History  Problem Relation Age of Onset  . Heart failure Mother   . Heart failure Father   .  Hypertension Sister   . Neuropathy Neg Hx      Past Surgical History:  Procedure Laterality Date  . CARDIAC CATHETERIZATION  2005  . CATARACT EXTRACTION W/ INTRAOCULAR LENS  IMPLANT, BILATERAL Bilateral   . COLONOSCOPY    . ESOPHAGOGASTRODUODENOSCOPY    . EXCISIONAL TOTAL KNEE ARTHROPLASTY Left 09/08/2013   Procedure: EXCISIONAL TOTAL KNEE ARTHROPLASTY POLYEXCHANGE;  Surgeon: Vickey Huger, MD;  Location: [facility];  Service: Orthopedics;  Laterality: Left;  . EYE SURGERY    . JOINT REPLACEMENT    . KNEE ARTHROSCOPY Right   . NASAL SINUS SURGERY     x 4  . SHOULDER ARTHROSCOPY W/ ROTATOR CUFF REPAIR Right   . TONSILLECTOMY  1940's  . TOTAL KNEE ARTHROPLASTY Left 04/21/2013  . TOTAL KNEE ARTHROPLASTY Left 04/21/2013   Procedure: TOTAL KNEE ARTHROPLASTY;  Surgeon: Vickey Huger, MD;  Location: [facility];   Service: Orthopedics;  Laterality: Left;  . TUBAL LIGATION      Social History   Socioeconomic History  . Marital status: Single    Spouse name: Not on file  . Number of children: 6  . Years of education: 22  . Highest education level: Not on file  Occupational History  . Occupation: Retired    Fish farm manager: [employer]    Comment: also caregiver  Social Needs  . Financial resource strain: Not on file  . Food insecurity    Worry: Not on file    Inability: Not on file  . Transportation needs    Medical: Not on file    Non-medical: Not on file  Tobacco Use  . Smoking status: Former Smoker    Packs/day: 1.00    Years: 10.00    Pack years: 10.00    Types: Cigarettes    Quit date: 03/30/1991    Years since quitting: 27.5  . Smokeless tobacco: Never Used  . Tobacco comment: quit smoking in Mar 1993  Substance and Sexual Activity  . Alcohol use: No    Alcohol/week: 0.0 standard drinks  . Drug use: No  . Sexual activity: Never    Birth control/protection: Post-menopausal  Lifestyle  . Physical activity    Days per week: Not on file    Minutes per session: Not on file  . Stress: Not on file  Relationships  . Social Herbalist on phone: Not on file    Gets together: Not on file    Attends religious service: Not on file    Active member of club or organization: Not on file    Attends meetings of clubs or organizations: Not on file    Relationship status: Not on file  . Intimate partner violence    Fear of current or ex partner: Not on file    Emotionally abused: Not on file    Physically abused: Not on file    Forced sexual activity: Not on file  Other Topics Concern  . Not on file  Social History Narrative   Originally from [place].    Previously worked in Engineer, mining for [employer]    Has 2 dogs currently.    Remote exposure to parrots in her current home.    No known mold exposure.    Remote travel to [place].    Has 1 indoor plant.    Caffeine use: No soda   2 cups  coffee every morning     Allergies  Allergen Reactions  . Doxycycline Rash     Outpatient  Medications Prior to Visit  Medication Sig Dispense Refill  . albuterol (PROVENTIL HFA;VENTOLIN HFA) 108 (90 BASE) MCG/ACT inhaler Inhale 2 puffs into the lungs every 6 (six) hours as needed for wheezing or shortness of breath. 1 Inhaler 3  . albuterol (PROVENTIL) (2.5 MG/3ML) 0.083% nebulizer solution USE 1 VIAL IN NEBULIZER EVERY 6 HOURS AS NEEDED FOR WHEEZING OR SHORTNESS OF BREATH 150 mL prn  . amLODipine (NORVASC) 5 MG tablet TAKE 1 TABLET BY MOUTH ONCE DAILY 90 tablet 3  . Calcium-Vitamin D (CALTRATE 600 PLUS-VIT D PO) Take 1 tablet by mouth daily.    . furosemide (LASIX) 20 MG tablet Take 1 tablet by mouth once daily 90 tablet 1  . ibandronate (BONIVA) 150 MG tablet Take 1 tablet (150 mg total) by mouth every 30 (thirty) days. Take in the morning with a full glass of water, on an empty stomach, and do not take anything else by mouth or lie down for the next 30 min. 1 tablet 11  . Omega-3 Fatty Acids (FISH OIL PO) Take 1 capsule by mouth daily.    Marland Kitchen omeprazole (PRILOSEC) 20 MG capsule Take 1 capsule by mouth once daily 90 capsule 3  . budesonide-formoterol (SYMBICORT) 160-4.5 MCG/ACT inhaler Inhale 2 puffs into the lungs 3 times/day as needed-between meals & bedtime. (Patient not taking: Reported on 10/24/2018) 1 Inhaler 3  . doxycycline (VIBRA-TABS) 100 MG tablet Take 1 tablet (100 mg total) by mouth 2 (two) times daily. 20 tablet 0  . levothyroxine (SYNTHROID) 100 MCG tablet Take 1 tablet by mouth once daily 90 tablet 0  . losartan (COZAAR) 100 MG tablet Take 1 tablet by mouth once daily 90 tablet 0  . mupirocin ointment (BACTROBAN) 2 % Apply to leg wound twice a day. 22 g 0  . Spacer/Aero-Holding Chambers (AEROCHAMBER MV) inhaler Use as instructed 1 each 0   No facility-administered medications prior to visit.     Review of Systems  Constitutional: Negative for chills, fever,  malaise/fatigue and weight loss.  HENT: Negative for hearing loss, sore throat and tinnitus.   Eyes: Negative for blurred vision and double vision.  Respiratory: Positive for cough. Negative for hemoptysis, sputum production, shortness of breath, wheezing and stridor.   Cardiovascular: Negative for chest pain, palpitations, orthopnea, leg swelling and PND.  Gastrointestinal: Negative for abdominal pain, constipation, diarrhea, heartburn, nausea and vomiting.  Genitourinary: Negative for dysuria, hematuria and urgency.  Musculoskeletal: Negative for joint pain and myalgias.  Skin: Negative for itching and rash.  Neurological: Negative for dizziness, tingling, weakness and headaches.  Endo/Heme/Allergies: Negative for environmental allergies. Does not bruise/bleed easily.  Psychiatric/Behavioral: Negative for depression. The patient is not nervous/anxious and does not have insomnia.   All other systems reviewed and are negative.    Objective:  Physical Exam Vitals signs reviewed.  Constitutional:      General: She is not in acute distress.    Appearance: She is well-developed.     Comments: Elderly  HENT:     Head: Normocephalic and atraumatic.  Eyes:     General: No scleral icterus.    Conjunctiva/sclera: Conjunctivae normal.     Pupils: Pupils are equal, round, and reactive to light.  Neck:     Musculoskeletal: Neck supple.     Vascular: No JVD.     Trachea: No tracheal deviation.  Cardiovascular:     Rate and Rhythm: Normal rate and regular rhythm.     Heart sounds: Normal heart sounds. No murmur.  Pulmonary:  Effort: Pulmonary effort is normal. No tachypnea, accessory muscle usage or respiratory distress.     Breath sounds: Normal breath sounds. No stridor. No wheezing, rhonchi or rales.  Abdominal:     General: Bowel sounds are normal. There is no distension.     Palpations: Abdomen is soft.     Tenderness: There is no abdominal tenderness.  Musculoskeletal:         General: No tenderness.     Comments: Severe kyphoscoliosis  Lymphadenopathy:     Cervical: No cervical adenopathy.  Skin:    General: Skin is warm and dry.     Capillary Refill: Capillary refill takes less than 2 seconds.     Findings: No rash.  Neurological:     Mental Status: She is alert and oriented to person, place, and time.  Psychiatric:        Behavior: Behavior normal.      Vitals:   10/24/18 1139  BP: 126/88  Pulse: 74  SpO2: 96%  Weight: 147 lb (66.7 kg)  Height: 5\' 3"  (1.6 m)   96% on RA BMI Readings from Last 3 Encounters:  10/24/18 26.04 kg/m  04/08/18 29.29 kg/m  04/04/18 29.10 kg/m   Wt Readings from Last 3 Encounters:  10/24/18 147 lb (66.7 kg)  04/08/18 155 lb (70.3 kg)  04/04/18 154 lb (69.9 kg)    CBC    Component Value Date/Time   WBC 4.8 02/26/2018 0949   RBC 4.83 02/26/2018 0949   HGB 13.1 02/26/2018 0949   HCT 39.8 02/26/2018 0949   PLT 144.0 (L) 02/26/2018 0949   MCV 82.5 02/26/2018 0949   MCH 27.6 01/24/2018 0921   MCHC 33.0 02/26/2018 0949   RDW 14.1 02/26/2018 0949   LYMPHSABS 1.1 02/26/2018 0949   MONOABS 0.5 02/26/2018 0949   EOSABS 0.1 02/26/2018 0949   BASOSABS 0.0 02/26/2018 0949    Chest Imaging: 01/31/2016: CT chest Ascending thoracic aneurysm 3.8 cm, large hiatal hernia, granuloma posterior aspect of the right upper lobe major fissure.  Aberrant right subclavian artery. The patient's images have been independently reviewed by me.    Pulmonary Functions Testing Results: PFT Results Latest Ref Rng & Units 07/29/2015  FVC-Pre L 2.15  FVC-Predicted Pre % 98  FVC-Post L 2.07  FVC-Predicted Post % 95  Pre FEV1/FVC % % 73  Post FEV1/FCV % % 71  FEV1-Pre L 1.57  FEV1-Predicted Pre % 97  FEV1-Post L 1.47  DLCO UNC% % 94  DLCO COR %Predicted % 102  TLC L 4.50  TLC % Predicted % 101  RV % Predicted % 112     FeNO: No results found for: NITRICOXIDE  Pathology: None   Echocardiogram:  2015 Study Conclusions -  Left ventricle: The cavity size was normal. Systolic function was normal. The estimated ejection fraction was in the range of 55% to 60%.  Heart Catheterization: None     Assessment & Plan:   Cough  Hiatal hernia  Seasonal allergies  Sinus congestion  Former smoker  Needs flu shot  Need for immunization against influenza - Plan: Flu Vaccine QUAD High Dose(Fluad)  Kyphoscoliosis  Discussion:  84 year old female history of cough.  Hiatal hernia and gastroesophageal reflux.  Seasonal allergies.  She could very well have asthma type symptoms also has a prior history of smoking.  Pulmonary function test for her are mixed results.  Predominantly due to her severe kyphoscoliosis which demonstrates evidence of restrictive disease.  She also needs a flu shot today.  Plan: Flu shot today. Continue albuterol nebulizer for shortness of breath and wheezing. Continue PPI. If she has recurrent respiratory symptoms she may benefit from the initiation of a inhaler with steroid or long-acting bronchodilator at some point.  At this time she does not want any inhalers.  We will continue to hold off on this. Patient to return to clinic as needed.  Or we can see her again in a year. Encouraged her to give [place] a call if any of her symptoms return or she has any more trouble we will be happy to see her and help her.  Greater than 50% of this patient's 15-minute office visit was been face-to-face discussing above recommendations and treatment plan.    Current Outpatient Medications:  .  albuterol (PROVENTIL HFA;VENTOLIN HFA) 108 (90 BASE) MCG/ACT inhaler, Inhale 2 puffs into the lungs every 6 (six) hours as needed for wheezing or shortness of breath., Disp: 1 Inhaler, Rfl: 3 .  albuterol (PROVENTIL) (2.5 MG/3ML) 0.083% nebulizer solution, USE 1 VIAL IN NEBULIZER EVERY 6 HOURS AS NEEDED FOR WHEEZING OR SHORTNESS OF BREATH, Disp: 150 mL, Rfl: prn .  amLODipine (NORVASC) 5 MG tablet, TAKE 1 TABLET  BY MOUTH ONCE DAILY, Disp: 90 tablet, Rfl: 3 .  Calcium-Vitamin D (CALTRATE 600 PLUS-VIT D PO), Take 1 tablet by mouth daily., Disp: , Rfl:  .  furosemide (LASIX) 20 MG tablet, Take 1 tablet by mouth once daily, Disp: 90 tablet, Rfl: 1 .  ibandronate (BONIVA) 150 MG tablet, Take 1 tablet (150 mg total) by mouth every 30 (thirty) days. Take in the morning with a full glass of water, on an empty stomach, and do not take anything else by mouth or lie down for the next 30 min., Disp: 1 tablet, Rfl: 11 .  Omega-3 Fatty Acids (FISH OIL PO), Take 1 capsule by mouth daily., Disp: , Rfl:  .  omeprazole (PRILOSEC) 20 MG capsule, Take 1 capsule by mouth once daily, Disp: 90 capsule, Rfl: 3 .  budesonide-formoterol (SYMBICORT) 160-4.5 MCG/ACT inhaler, Inhale 2 puffs into the lungs 3 times/day as needed-between meals & bedtime. (Patient not taking: Reported on 10/24/2018), Disp: 1 Inhaler, Rfl: 3   Garner Nash, DO [facility] 10/24/2018 12:10 PM

## 2018-10-24 NOTE — Patient Instructions (Addendum)
Thank you for visiting Dr. Valeta Harms at [facility]. Today we recommend the following:  Continue albuterol as needed.   Return in about 1 year (around 10/24/2019).    Please do your part to reduce the spread of COVID-19.

## 2018-11-04 ENCOUNTER — Other Ambulatory Visit: Payer: Self-pay

## 2018-11-04 MED ORDER — IBANDRONATE SODIUM 150 MG PO TABS: 150.0000 mg | ORAL_TABLET | ORAL | 11 refills | Status: DC

## 2018-11-17 ENCOUNTER — Other Ambulatory Visit: Payer: Self-pay | Admitting: Internal Medicine

## 2018-11-17 NOTE — Telephone Encounter (Signed)
Needs CPE and wellness visit after January 21. Please book before refilling

## 2018-12-11 ENCOUNTER — Other Ambulatory Visit: Payer: Self-pay | Admitting: Internal Medicine

## 2019-02-04 ENCOUNTER — Ambulatory Visit (INDEPENDENT_AMBULATORY_CARE_PROVIDER_SITE_OTHER): Payer: Medicare Other | Admitting: Internal Medicine

## 2019-02-04 ENCOUNTER — Other Ambulatory Visit: Payer: Self-pay

## 2019-02-04 ENCOUNTER — Telehealth: Payer: Self-pay | Admitting: Internal Medicine

## 2019-02-04 ENCOUNTER — Encounter: Payer: Self-pay | Admitting: Internal Medicine

## 2019-02-04 VITALS — BP 130/80 | HR 88 | Temp 98.0°F | Ht 63.0 in | Wt 148.0 lb

## 2019-02-04 DIAGNOSIS — I440 Atrioventricular block, first degree: Secondary | ICD-10-CM | POA: Diagnosis not present

## 2019-02-04 DIAGNOSIS — F5105 Insomnia due to other mental disorder: Secondary | ICD-10-CM | POA: Diagnosis not present

## 2019-02-04 DIAGNOSIS — R0789 Other chest pain: Secondary | ICD-10-CM

## 2019-02-04 DIAGNOSIS — F411 Generalized anxiety disorder: Secondary | ICD-10-CM | POA: Diagnosis not present

## 2019-02-04 DIAGNOSIS — F409 Phobic anxiety disorder, unspecified: Secondary | ICD-10-CM | POA: Diagnosis not present

## 2019-02-04 DIAGNOSIS — Z8709 Personal history of other diseases of the respiratory system: Secondary | ICD-10-CM | POA: Diagnosis not present

## 2019-02-04 MED ORDER — PAROXETINE HCL 10 MG PO TABS: 10.0000 mg | ORAL_TABLET | Freq: Every day | ORAL | 3 refills | Status: DC

## 2019-02-04 MED ORDER — ALPRAZOLAM 0.5 MG PO TABS: 0.5000 mg | ORAL_TABLET | Freq: Every day | ORAL | 1 refills | Status: DC

## 2019-02-04 NOTE — Telephone Encounter (Signed)
Schedule appt?

## 2019-02-04 NOTE — Telephone Encounter (Signed)
Terri Bridges [phone]  Travis called to say she feels like she needs to come in and see you, she has some left side chest tightness, and anxiety, she stated that someone could look at her and it will make her cry.

## 2019-02-04 NOTE — Progress Notes (Signed)
   Subjective:    Patient ID: Terri Bridges, female    DOB: Mar 30, 1934, 85 y.o.   MRN: [id number]  HPI 85 year old Female in today with complaint of left chest pain, insomnia and anxiety.  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  Has been doing some heavy lifting and vacuuming.  May have developed chest wall pain from that.  She has no prior cardiac history.  She has a history of COPD and anxiety.  Pain does not radiate into the neck or down her arm.    Review of Systems no nausea vomiting or diaphoresis     Objective:   Physical Exam Blood pressure 130/80 BMI 26.22 pulse 88 regular temperature 90 degrees orally pulse oximetry 95% weight 148 pounds.  Skin warm and dry.  Nodes none.  Neck is supple without JVD thyromegaly or carotid bruits.  Chest is clear to auscultation without rales or wheezing.  She is not coughing.  She has palpable left chest wall tenderness today.  Extremities without pitting edema.  EKG shows first-degree AV block without ectopy.       Assessment & Plan:  Left chest wall pain  Anxiety state aggravated by the pandemic and situational stress.  Also has insomnia.  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  She is having some issues with insomnia and I prescribed Xanax 0.05 mg at bedtime.  Her blood pressure stable on current regimen.  She has no evidence of congestive heart failure and no evidence of exacerbation of COPD.  May take over-the-counter anti-inflammatory for chest wall pain.  Start Paxil 10 mg for anxiety and depression.  Follow-up in early March at which time she will be due for Medicare wellness and health maintenance exam along with fasting labs.

## 2019-02-04 NOTE — Telephone Encounter (Signed)
Appointment scheduled.

## 2019-02-09 NOTE — Patient Instructions (Signed)
Take Xanax 0.5 mg at night to sleep and start Paxil 10 mg daily for anxiety and depression.  May take over-the-counter anti-inflammatory for chest wall pain.  Avoid heavy lifting and heavy vacuuming.  Return in March for health maintenance exam.  Call if symptoms persist before that.

## 2019-02-23 DIAGNOSIS — Z23 Encounter for immunization: Secondary | ICD-10-CM | POA: Diagnosis not present

## 2019-02-24 ENCOUNTER — Encounter: Payer: Self-pay | Admitting: Internal Medicine

## 2019-02-24 ENCOUNTER — Telehealth: Payer: Self-pay

## 2019-02-24 ENCOUNTER — Other Ambulatory Visit: Payer: Self-pay

## 2019-02-24 ENCOUNTER — Ambulatory Visit (INDEPENDENT_AMBULATORY_CARE_PROVIDER_SITE_OTHER): Payer: Medicare Other | Admitting: Internal Medicine

## 2019-02-24 VITALS — Ht 63.0 in | Wt 148.0 lb

## 2019-02-24 DIAGNOSIS — F439 Reaction to severe stress, unspecified: Secondary | ICD-10-CM

## 2019-02-24 DIAGNOSIS — Z599 Problem related to housing and economic circumstances, unspecified: Secondary | ICD-10-CM

## 2019-02-24 DIAGNOSIS — F419 Anxiety disorder, unspecified: Secondary | ICD-10-CM | POA: Diagnosis not present

## 2019-02-24 DIAGNOSIS — Z598 Other problems related to housing and economic circumstances: Secondary | ICD-10-CM

## 2019-02-24 DIAGNOSIS — J209 Acute bronchitis, unspecified: Secondary | ICD-10-CM

## 2019-02-24 DIAGNOSIS — F32A Depression, unspecified: Secondary | ICD-10-CM

## 2019-02-24 DIAGNOSIS — J9801 Acute bronchospasm: Secondary | ICD-10-CM

## 2019-02-24 DIAGNOSIS — J440 Chronic obstructive pulmonary disease with acute lower respiratory infection: Secondary | ICD-10-CM | POA: Diagnosis not present

## 2019-02-24 DIAGNOSIS — F329 Major depressive disorder, single episode, unspecified: Secondary | ICD-10-CM | POA: Diagnosis not present

## 2019-02-24 MED ORDER — HYDROCODONE-HOMATROPINE 5-1.5 MG/5ML PO SYRP: 5.0000 mL | ORAL_SOLUTION | Freq: Three times a day (TID) | ORAL | 0 refills | Status: DC | PRN

## 2019-02-24 MED ORDER — HYDROCOD POLST-CPM POLST ER 10-8 MG/5ML PO SUER: 5.0000 mL | Freq: Two times a day (BID) | ORAL | 0 refills | Status: DC | PRN

## 2019-02-24 MED ORDER — PREDNISONE 10 MG PO TABS: ORAL_TABLET | ORAL | 0 refills | Status: DC

## 2019-02-24 MED ORDER — LEVOFLOXACIN 500 MG PO TABS: 500.0000 mg | ORAL_TABLET | Freq: Every day | ORAL | 0 refills | Status: DC

## 2019-02-24 NOTE — Telephone Encounter (Signed)
Scheduled phone visit, patient does not have smart phone or computer

## 2019-02-24 NOTE — Telephone Encounter (Signed)
Patient called has a sinusitis and ear pain. She wants to know if you send her in something? I told her we need to do a virtual office okay to schedule?

## 2019-02-24 NOTE — Progress Notes (Signed)
   Subjective:    Patient ID: Terri Bridges, female    DOB: [birth year], 85 y.o.   MRN: [id number]               85 year old Female could not be seen today by audio and video telecommunications.  Due to the Coronavirus pandemic, virtual visit is preferred as she is acutely ill.  She does not have a smart phone.  Therefore, we conducted visit with audio only via telephone.  She is identified using 2 identifiers as Terri Bridges, a patient in this practice.  She is agreeable to visit in this format today.  HPI Hx of restrictive(kyphoscoliosis) and obstructive(COPD) lung disease followed by Pulmonary and was last seen there Oct. 2020. Has albuterol inhaler and nebulizer.  History of anxiety.  Patient says she received a collection statement from [facility] .  I am not sure what she was getting from them other than nebulizer supplies.  She says that the bill was for $347 and she is distraught about this.  We will ask her to bring bill for staff to review.  Apparently this has to do with an order from Pulmonology.  Longstanding history of recurrent maxillary sinusitis, bronchospasm related to acute respiratory infections, history of bouts of otitis media.  Review of Systems see above anxious over situational stress with the help from home care company.  Says right ear is draining yellow fluid  No fever or shaking chills.  Has cough and wheezing and headache.  No known COVID-19 exposure.    Objective:   Physical Exam  Patient did not take vital signs at home.  She is acutely anxious and distraught over bill she received.  Discussed with her by phone.  Complains of wheezing.  No audible wheezing heard over the phone.  Does not sound to tachypneic.  No fever.  Says the right ear is popping and draining yellow fluid.      Assessment & Plan:  Acute maxillary sinusitis  Acute right otitis media  COPD-having acute bronchospasm-can use home nebulizer and inhaler.  We have samples of  Symbicort inhaler she can pick up here in the office.  Financial stress-patient is to bring bill from [place] for staff to review and assist if possible.  Anxiety state-has Xanax for anxiety.  Clearly upset today with bill from collections.  20 minutes spent with patient including history taking medical decision making, counseling regarding situational stress  Plan: Levaquin 500 mg daily for 10 days.  Prednisone 10 mg (#21) going from 60 mg to 0 mg over 7 days.  Initially called in Hycodan but it is not available at [facility] so we will call in  instead Tussionex 1 teaspoon p.o. every 12 hours as needed cough.  Rest and drink plenty of fluids.  May pick up samples of Symbicort.  at  my office.

## 2019-02-24 NOTE — Telephone Encounter (Signed)
Please set up virtual visit

## 2019-02-24 NOTE — Telephone Encounter (Signed)
Called patient to schedule virtual visit, she does have smart phone or computer, so it will need to be phone visit. She is at pharmacy right now and will call back to schedule. However she did get her first vaccine on Saturday.

## 2019-02-24 NOTE — Patient Instructions (Signed)
Levaquin 500 mg daily for 10 days.  Use albuterol nebulizer.  Take tapering course of prednisone starting with 60 mg and decreasing by 10 mg daily over the next 6 days.  Tussionex 1 teaspoon p.o. every 12 hours as needed cough as Hycodan is not available at [facility].  Rest and drink plenty of fluids.  Bring collection statement by office for my staff to review.  May pick up samples of Symbicort at that time.  Call if not improved in 24 to 48 hours or sooner if worse.

## 2019-03-14 ENCOUNTER — Other Ambulatory Visit: Payer: Medicare Other | Admitting: Internal Medicine

## 2019-03-14 ENCOUNTER — Other Ambulatory Visit: Payer: Self-pay

## 2019-03-14 DIAGNOSIS — M810 Age-related osteoporosis without current pathological fracture: Secondary | ICD-10-CM | POA: Diagnosis not present

## 2019-03-14 DIAGNOSIS — I10 Essential (primary) hypertension: Secondary | ICD-10-CM

## 2019-03-14 DIAGNOSIS — F419 Anxiety disorder, unspecified: Secondary | ICD-10-CM | POA: Diagnosis not present

## 2019-03-14 DIAGNOSIS — F329 Major depressive disorder, single episode, unspecified: Secondary | ICD-10-CM

## 2019-03-14 DIAGNOSIS — Z0000 Encounter for general adult medical examination without abnormal findings: Secondary | ICD-10-CM | POA: Diagnosis not present

## 2019-03-14 DIAGNOSIS — E7800 Pure hypercholesterolemia, unspecified: Secondary | ICD-10-CM | POA: Diagnosis not present

## 2019-03-14 DIAGNOSIS — M1712 Unilateral primary osteoarthritis, left knee: Secondary | ICD-10-CM

## 2019-03-14 DIAGNOSIS — M1711 Unilateral primary osteoarthritis, right knee: Secondary | ICD-10-CM

## 2019-03-14 DIAGNOSIS — F324 Major depressive disorder, single episode, in partial remission: Secondary | ICD-10-CM | POA: Diagnosis not present

## 2019-03-14 DIAGNOSIS — K219 Gastro-esophageal reflux disease without esophagitis: Secondary | ICD-10-CM | POA: Diagnosis not present

## 2019-03-14 DIAGNOSIS — F439 Reaction to severe stress, unspecified: Secondary | ICD-10-CM | POA: Diagnosis not present

## 2019-03-15 DIAGNOSIS — S81811A Laceration without foreign body, right lower leg, initial encounter: Secondary | ICD-10-CM | POA: Diagnosis not present

## 2019-03-15 LAB — LIPID PANEL
Cholesterol: 201 mg/dL — ABNORMAL HIGH (ref <200)
HDL: 69 mg/dL (ref >=50)
LDL Cholesterol (Calc): 118 mg/dL (calc) — ABNORMAL HIGH
Non-HDL Cholesterol (Calc): 132 mg/dL (calc) — ABNORMAL HIGH (ref <130)
Total CHOL/HDL Ratio: 2.9 (calc) (ref <5.0)
Triglycerides: 52 mg/dL (ref <150)

## 2019-03-15 LAB — CBC WITH DIFFERENTIAL/PLATELET
Absolute Monocytes: 367 cells/uL (ref 200–950)
Basophils Absolute: 31 cells/uL (ref 0–200)
Basophils Relative: 0.6 %
Eosinophils Absolute: 199 cells/uL (ref 15–500)
Eosinophils Relative: 3.9 %
HCT: 40.5 % (ref 35.0–45.0)
Hemoglobin: 13.2 g/dL (ref 11.7–15.5)
Lymphs Abs: 1321 cells/uL (ref 850–3900)
MCH: 27.3 pg (ref 27.0–33.0)
MCHC: 32.6 g/dL (ref 32.0–36.0)
MCV: 83.9 fL (ref 80.0–100.0)
MPV: 10.8 fL (ref 7.5–12.5)
Monocytes Relative: 7.2 %
Neutro Abs: 3182 cells/uL (ref 1500–7800)
Neutrophils Relative %: 62.4 %
Platelets: 158 10*3/uL (ref 140–400)
RBC: 4.83 10*6/uL (ref 3.80–5.10)
RDW: 13.1 % (ref 11.0–15.0)
Total Lymphocyte: 25.9 %
WBC: 5.1 10*3/uL (ref 3.8–10.8)

## 2019-03-15 LAB — COMPLETE METABOLIC PANEL WITH GFR
AG Ratio: 1.7 (calc) (ref 1.0–2.5)
ALT: 9 U/L (ref 6–29)
AST: 17 U/L (ref 10–35)
Albumin: 3.6 g/dL (ref 3.6–5.1)
Alkaline phosphatase (APISO): 53 U/L (ref 37–153)
BUN/Creatinine Ratio: 23 (calc) — ABNORMAL HIGH (ref 6–22)
BUN: 23 mg/dL (ref 7–25)
CO2: 22 mmol/L (ref 20–32)
Calcium: 8.6 mg/dL (ref 8.6–10.4)
Chloride: 112 mmol/L — ABNORMAL HIGH (ref 98–110)
Creat: 1.02 mg/dL — ABNORMAL HIGH (ref 0.60–0.88)
GFR, Est African American: 59 mL/min/{1.73_m2} — ABNORMAL LOW (ref >=60)
GFR, Est Non African American: 51 mL/min/{1.73_m2} — ABNORMAL LOW (ref >=60)
Globulin: 2.1 g/dL (calc) (ref 1.9–3.7)
Glucose, Bld: 81 mg/dL (ref 65–99)
Potassium: 4.5 mmol/L (ref 3.5–5.3)
Sodium: 143 mmol/L (ref 135–146)
Total Bilirubin: 0.5 mg/dL (ref 0.2–1.2)
Total Protein: 5.7 g/dL — ABNORMAL LOW (ref 6.1–8.1)

## 2019-03-15 LAB — TSH: TSH: 2.65 mIU/L (ref 0.40–4.50)

## 2019-03-17 ENCOUNTER — Other Ambulatory Visit: Payer: Self-pay | Admitting: Internal Medicine

## 2019-03-17 ENCOUNTER — Other Ambulatory Visit: Payer: Self-pay

## 2019-03-17 ENCOUNTER — Ambulatory Visit (INDEPENDENT_AMBULATORY_CARE_PROVIDER_SITE_OTHER): Payer: Medicare Other | Admitting: Internal Medicine

## 2019-03-17 VITALS — BP 140/80 | HR 82 | Temp 98.1°F | Ht <= 58 in | Wt 153.0 lb

## 2019-03-17 DIAGNOSIS — F329 Major depressive disorder, single episode, unspecified: Secondary | ICD-10-CM | POA: Diagnosis not present

## 2019-03-17 DIAGNOSIS — M1711 Unilateral primary osteoarthritis, right knee: Secondary | ICD-10-CM | POA: Diagnosis not present

## 2019-03-17 DIAGNOSIS — Z8709 Personal history of other diseases of the respiratory system: Secondary | ICD-10-CM

## 2019-03-17 DIAGNOSIS — K4090 Unilateral inguinal hernia, without obstruction or gangrene, not specified as recurrent: Secondary | ICD-10-CM | POA: Diagnosis not present

## 2019-03-17 DIAGNOSIS — E039 Hypothyroidism, unspecified: Secondary | ICD-10-CM

## 2019-03-17 DIAGNOSIS — N1831 Chronic kidney disease, stage 3a: Secondary | ICD-10-CM | POA: Diagnosis not present

## 2019-03-17 DIAGNOSIS — S81811D Laceration without foreign body, right lower leg, subsequent encounter: Secondary | ICD-10-CM | POA: Diagnosis not present

## 2019-03-17 DIAGNOSIS — F419 Anxiety disorder, unspecified: Secondary | ICD-10-CM | POA: Diagnosis not present

## 2019-03-17 DIAGNOSIS — I10 Essential (primary) hypertension: Secondary | ICD-10-CM

## 2019-03-17 DIAGNOSIS — M810 Age-related osteoporosis without current pathological fracture: Secondary | ICD-10-CM

## 2019-03-17 DIAGNOSIS — Z0000 Encounter for general adult medical examination without abnormal findings: Secondary | ICD-10-CM

## 2019-03-17 DIAGNOSIS — F5105 Insomnia due to other mental disorder: Secondary | ICD-10-CM | POA: Diagnosis not present

## 2019-03-17 DIAGNOSIS — F409 Phobic anxiety disorder, unspecified: Secondary | ICD-10-CM

## 2019-03-17 LAB — POCT URINALYSIS DIPSTICK
Appearance: NEGATIVE
Bilirubin, UA: NEGATIVE
Blood, UA: NEGATIVE
Glucose, UA: NEGATIVE
Ketones, UA: NEGATIVE
Leukocytes, UA: NEGATIVE
Nitrite, UA: NEGATIVE
Odor: NEGATIVE
Protein, UA: NEGATIVE
Spec Grav, UA: 1.01 (ref 1.010–1.025)
Urobilinogen, UA: 0.2 E.U./dL
pH, UA: 6.5 (ref 5.0–8.0)

## 2019-03-17 MED ORDER — LEVOTHYROXINE SODIUM 100 MCG PO TABS: 100.0000 ug | ORAL_TABLET | Freq: Every day | ORAL | 1 refills | Status: DC

## 2019-03-17 NOTE — Progress Notes (Signed)
Subjective:    Patient ID: Terri Bridges, female    DOB: 1934/07/08, 85 y.o.   MRN: [id number]  HPI 85 year old Female for [facility], health maintenance exam and evaluation of medical issues.  She has chronic depression and has been depressed for years.  She has history of hypertension hypothyroidism allergic rhinitis, GE reflux, recurrent sinusitis, COPD and osteoporosis.  In the Spring 2015 she had a total left knee arthroplasty by Dr. Lorre Nick and 4 months later had revision of the surgery due to instability.  She was scheduled to have right knee arthroplasty but has postponed it.  In 1991 she had sinus surgery by Dr. Lorella Nimrod.  Please see dictation January 21, 2015.  She subsequently suffered a periorbital abscess which was drained by right intranasal ethmoidectomy in March 1993.  Also had ethmoidectomy, left sphenoidectomy, transnasal frontal sinusotomy March 1993.  An arthroscopic surgery right knee 2004.  Bilateral grommet tubes placed February 2004.  Left cataract extraction 2013.  Right rotator cuff repair 2011.  In 2008 she had colonoscopy done by Dr. Oletta Lamas and upper GI endoscopy which showed hiatal hernia.  History of asthma and allergic rhinitis treated by Dr. Neldon Mc.  Had exercise tolerance test to evaluate chest pain with Dr. Doreatha Bridges November 1998.  Study was negative.  Dr. Verl Blalock saw her in 2005 for palpitations and placed her on Cardizem with improvement in blood pressure and palpitations.  She had cardiac cath at that time showing no significant coronary disease.  History of osteoporosis with T score in the left femoral neck -2.5 and T score in the lumbar spine being -2.5 in February 2013.  Because of GE reflux it was not likely she would tolerate Fosamax.  She took Actonel for a while in 2002 but discontinued it.  She has difficulty affording some of her medications and we have given her many samples throughout the years.  She is a former heavy smoker having  smoked 1 to 2 packs of cigarettes daily for 17 years.  She quit smoking in 1993.  Suffered a superficial laceration right anterior lower leg on Saturday. Seen [facility]. Fell from a chair. Laceration treated with steristrips and looks good today with no evidence of secondary infection. Tetanus vaccine is up to date.  Has had one Moderna vaccine Feb 13th.  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  She is divorced.  Previous jobs include working at [employer] for [employer] and receiving before she retired.  Prior to that she worked at [employer]  In December 2014 her son suffered an arrest in the backyard and subsequently expired presumably of an MI.  He had prior history of drug abuse.  Her granddaughter who has a leg deformity and has had multiple surgeries at [facility] in [place] moved out of patient's home to the [place] [facility] area with her boyfriend and female has a baby.  Family history: Father died at age 46 of an MI.  Mother died at age 74 of an MI.      Review of Systems chronically fatigued and has chronic dysthymia with anxiety and depression.  Musculoskeletal pain with physical activity.  When wheezing she has home nebulizer for use.     Objective:   Physical Exam Blood pressure 140/80 pulse 82 temperature 98.1 degrees pulse oximetry 99% weight 153 pounds height 4 feet 10 inches BMI 31.98 Laceration  right lower leg is stable without evidence of secondary infection.  Neck is supple without JVD thyromegaly or carotid bruits.  Chest clear to auscultation without rales or wheezing.  Cardiac exam regular rate and rhythm normal S1 and S2.  Breast without masses.  Abdomen soft nondistended without hepatosplenomegaly masses or tenderness.  Bimanual exam deferred.  No lower extremity edema.  Neuro intact without focal deficits.  Affect thought and judgment are  normal.      Assessment & Plan:  Superficial laceration right lower extremity treated at [facility] urgent care in stable  Anxiety and depression-stable with current medication  History of COPD-stable at present  History of recurrent sinusitis  Essential hypertension-stable on current regimen  Hypothyroidism-TSH stable on thyroid replacement  GE reflux-treated with PPI  Hearing loss  Status post total knee arthroplasty.  Mild chronic kidney disease  History of left inguinal hernia  Plan: Continue current medications and return in 1 year or as needed.  Subjective:   Patient presents for Medicare Annual/Subsequent preventive examination.  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 No  Over the past two weeks, have you felt little interest or pleasure in doing things? No Have you lost interest or pleasure in daily life? No Do you often feel hopeless? No Do you cry easily over simple problems? No   Activities of Daily Living  In your present state of health, do you have any difficulty performing the following activities?:   Driving? No  Managing money? No  Feeding yourself? No  Getting from bed to chair? No  Climbing a flight of stairs? No  Preparing food and eating?: No  Bathing or showering? No  Getting dressed: No  Getting to the toilet? No  Using the toilet:No  Moving around from place to place: No  In the past year have you fallen or had a near fall?:No  Are you sexually active? No  Do you have more than one partner?  No   Hearing Difficulties: No  Do you often ask people to speak up or repeat themselves?  Yes Do you experience ringing or noises in your ears?  Yes Do you have difficulty understanding soft or whispered voices?  Yes Do you feel that you have a problem with memory? No Do you often misplace items? No    Home Safety:  Do you have a smoke alarm at your residence? Yes Do you have grab bars in the bathroom?  Yes Do you have throw rugs in your house?  None   Cognitive Testing  Alert? Yes Normal Appearance?Yes  Oriented to person? Yes Place? Yes  Time? Yes  Recall of three objects? Yes  Can perform simple calculations?  Not tested displays appropriate judgment?Yes  Can read the correct time from a watch face?Yes   List the Names of Other Physician/Practitioners you currently use:  See referral list for the physicians patient is currently seeing.  Has seen pulmonary and cardiology in the past   Review of Systems: See above   Objective:     General appearance: Appears stated age and mildly obese  Head: Normocephalic, without obvious abnormality, atraumatic  Eyes: conj clear, EOMi PEERLA  Ears: normal TM's and external ear canals both ears  Nose: Nares normal. Septum midline. Mucosa normal. No drainage or sinus tenderness.  Throat: lips, mucosa, and tongue normal; teeth and gums normal  Neck: no adenopathy, no carotid bruit, no JVD, supple, symmetrical, trachea midline and thyroid not enlarged, symmetric, no tenderness/mass/nodules  No CVA tenderness.  Lungs: clear to auscultation bilaterally  Breasts: normal appearance, no masses or tenderness Heart: regular rate and rhythm, S1, S2 normal, no murmur, click, rub or gallop  Abdomen: soft, non-tender; bowel sounds normal; no masses, no organomegaly  Musculoskeletal: ROM normal in all joints, no crepitus, no deformity, Normal muscle strengthen. Back  is symmetric, no curvature. Skin: Skin color, texture, turgor normal. No rashes or  lesions  Lymph nodes: Cervical, supraclavicular, and axillary nodes normal.  Neurologic: CN 2 -12 Normal, Normal symmetric reflexes. Normal coordination and gait  Psych: Alert & Oriented x 3, Mood appear stable.    Assessment:    Annual wellness medicare exam   Plan:    During the course of the visit the patient was educated and counseled about appropriate screening and preventive services including:   Had 1 Moderna vaccine in February 2021     Patient Instructions (the written plan) was given to the patient.  Medicare Attestation  I have personally reviewed:  The patient's medical and social history  Their use of alcohol, tobacco or illicit drugs  Their current medications and supplements  The patient's functional ability including ADLs,fall risks, home safety risks, cognitive, and hearing and visual impairment  Diet and physical activities  Evidence for depression or mood disorders  The patient's weight, height, BMI, and visual acuity have been recorded in the chart. I have made referrals, counseling, and provided education to the patient based on review of the above and I have provided the patient with a written personalized care plan for preventive services.

## 2019-03-22 DIAGNOSIS — Z23 Encounter for immunization: Secondary | ICD-10-CM | POA: Diagnosis not present

## 2019-03-31 ENCOUNTER — Encounter (HOSPITAL_COMMUNITY): Payer: Self-pay | Admitting: Family Medicine

## 2019-03-31 ENCOUNTER — Other Ambulatory Visit: Payer: Self-pay | Admitting: Internal Medicine

## 2019-03-31 ENCOUNTER — Ambulatory Visit (HOSPITAL_COMMUNITY)
Admission: EM | Admit: 2019-03-31 | Discharge: 2019-03-31 | Disposition: A | Discharge status: Home / Self Care | Payer: Medicare Other | Attending: Family Medicine | Admitting: Family Medicine

## 2019-03-31 ENCOUNTER — Other Ambulatory Visit: Payer: Self-pay

## 2019-03-31 DIAGNOSIS — L03115 Cellulitis of right lower limb: Secondary | ICD-10-CM | POA: Diagnosis not present

## 2019-03-31 MED ORDER — MUPIROCIN 2 % EX OINT: 1.0000 "application " | TOPICAL_OINTMENT | Freq: Three times a day (TID) | CUTANEOUS | 1 refills | Status: DC

## 2019-03-31 MED ORDER — CEPHALEXIN 500 MG PO CAPS: 500.0000 mg | ORAL_CAPSULE | Freq: Three times a day (TID) | ORAL | 0 refills | Status: DC

## 2019-03-31 NOTE — ED Provider Notes (Signed)
Terri Bridges    CSN: [id number] Arrival date & time: 03/31/19  1047      History   Chief Complaint Chief Complaint  Patient presents with  . Wound Check    HPI Terri Bridges is a 85 y.o. female.   85 yo woman making initial visit to [facility] complaining of right leg swelling, redness and stinging after a skin flap laceration with dermabond repair on March 15, 2019.   Patient has long list of medical problems including hypertension, CKD (creatinine slightly over 1.0), left total knee replacement .  She has a remote h/o smoking.  Patient runs a trailor park.     Past Medical History:  Diagnosis Date  . Allergic rhinitis   . Allergy    uses Flonase daily as needed  . Anxiety    takes Xanax daily as needed  . Arthritis    "back" (04/21/2013)  . Chronic lower back pain   . Constipation    takes an OTC stool softener  . Depression    takes Zoloft daily  . Dry eyes    uses eye drops  . Exertional shortness of breath   . GERD (gastroesophageal reflux disease)    takes Omeprazole daily and Protonix daily as needed  . H/O hiatal hernia   . Headache(784.0)    sinus  . Heart murmur    years ago  . History of bronchitis    last time many yrs ago  . Hypertension    takes Losartan and Cardizem daily  . Hypothyroidism    takes Synthroid daily  . Joint pain   . Joint swelling   . Nocturia   . Osteoporosis   . Pressure in chest 08/23/13   fell and started having chest pressure, will have ECHO and stress test 09/04/13 before surgery  . Ringing in ears    sees Dr.Byers for this  . Scoliosis   . Urinary frequency   . Urinary incontinence   . Urinary urgency     Patient Active Problem List   Diagnosis Date Noted    03/26/2018  . Left inguinal hernia 08/05/2017    08/05/2017  . Chronic pansinusitis 05/31/2016  .    Marland Kitchen Sensorineural hearing loss (SNHL), bilateral 05/31/2016  . Carpal tunnel syndrome 05/12/2015    05/12/2015  . Primary arthrosis of first  carpometacarpal joints, bilateral 05/12/2015  . Chronic kidney disease 05/22/2014    09/08/2013  . H/O total knee replacement 08/11/2013    04/21/2013    04/11/2013  . Osteoporosis, unspecified 03/24/2012  . At high risk for falls 02/12/2012    02/12/2012  . Anxiety 03/06/2011  . COPD (chronic obstructive pulmonary disease) ([place]) 01/08/2011  . Hypertension 01/05/2011  . Hypothyroidism 01/05/2011    01/05/2011    01/05/2011  . Depression 01/05/2011  . GE reflux 01/05/2011    Past Surgical History:  Procedure Laterality Date  . CARDIAC CATHETERIZATION  2005  . CATARACT EXTRACTION W/ INTRAOCULAR LENS  IMPLANT, BILATERAL Bilateral   . COLONOSCOPY    . ESOPHAGOGASTRODUODENOSCOPY    . EXCISIONAL TOTAL KNEE ARTHROPLASTY Left 09/08/2013   Procedure: EXCISIONAL TOTAL KNEE ARTHROPLASTY POLYEXCHANGE;  Surgeon: Vickey Huger, MD;  Location: [facility];  Service: Orthopedics;  Laterality: Left;  . EYE SURGERY    . JOINT REPLACEMENT    . KNEE ARTHROSCOPY Right   . NASAL SINUS SURGERY     x 4  . SHOULDER ARTHROSCOPY W/ ROTATOR CUFF REPAIR Right   . TONSILLECTOMY  1940's  . TOTAL KNEE ARTHROPLASTY Left 04/21/2013  . TOTAL KNEE ARTHROPLASTY Left 04/21/2013   Procedure: TOTAL KNEE ARTHROPLASTY;  Surgeon: Vickey Huger, MD;  Location: [facility] [place];  Service: Orthopedics;  Laterality: Left;  . TUBAL LIGATION      OB History   No obstetric history on file.      Home Medications    Prior to Admission medications   Medication Sig Start Date End Date Taking? Authorizing Provider  amLODipine (NORVASC) 5 MG tablet TAKE 1 TABLET BY MOUTH ONCE DAILY 03/05/18  Yes Baxley, Cresenciano Lick, MD  furosemide (LASIX) 20 MG tablet Take 1 tablet by mouth once daily 11/20/18  Yes Baxley, Cresenciano Lick, MD  levothyroxine (SYNTHROID) 100 MCG tablet Take 1 tablet by mouth once daily 03/18/19  Yes Baxley, Cresenciano Lick, MD  losartan (COZAAR) 100 MG tablet Take 1 tablet by mouth once daily 11/20/18  Yes Baxley, Cresenciano Lick, MD  omeprazole (PRILOSEC) 20  MG capsule Take 1 capsule by mouth once daily 08/01/18  Yes Baxley, Cresenciano Lick, MD  albuterol (PROVENTIL HFA;VENTOLIN HFA) 108 (90 BASE) MCG/ACT inhaler Inhale 2 puffs into the lungs every 6 (six) hours as needed for wheezing or shortness of breath. 06/22/14   Elby Showers, MD  albuterol (PROVENTIL) (2.5 MG/3ML) 0.083% nebulizer solution USE 1 VIAL IN NEBULIZER EVERY 6 HOURS AS NEEDED FOR WHEEZING OR SHORTNESS OF BREATH 07/05/18   Elby Showers, MD  ALPRAZolam Duanne Moron) 0.5 MG tablet Take 1 tablet (0.5 mg total) by mouth at bedtime. 02/04/19   Elby Showers, MD  budesonide-formoterol (SYMBICORT) 160-4.5 MCG/ACT inhaler Inhale 2 puffs into the lungs 3 times/day as needed-between meals & bedtime. 03/26/18   Martyn Ehrich, NP  Calcium-Vitamin D (CALTRATE 600 PLUS-VIT D PO) Take 1 tablet by mouth daily.    [provider]  cephALEXin (KEFLEX) 500 MG capsule Take 1 capsule (500 mg total) by mouth 3 (three) times daily. 03/31/19   Robyn Haber, MD  ibandronate (BONIVA) 150 MG tablet Take 1 tablet (150 mg total) by mouth every 30 (thirty) days. Take in the morning with a full glass of water, on an empty stomach, and do not take anything else by mouth or lie down for the next 30 min. 11/04/18   Elby Showers, MD  mupirocin ointment (BACTROBAN) 2 % Apply 1 application topically 3 (three) times daily. 03/31/19   Robyn Haber, MD  Omega-3 Fatty Acids (FISH OIL PO) Take 1 capsule by mouth daily.    [provider]  PARoxetine (PAXIL) 10 MG tablet Take 1 tablet (10 mg total) by mouth daily. Patient taking differently: Take 10 mg by mouth daily. Not started taking 02/04/19   Elby Showers, MD    Family History Family History  Problem Relation Age of Onset  . Heart failure Mother   . Heart failure Father   . Hypertension Sister   . Neuropathy Neg Hx     Social History Social History   Tobacco Use  . Smoking status: Former Smoker    Packs/day: 1.00    Years: 10.00    Pack years:  10.00    Types: Cigarettes    Quit date: 03/30/1991    Years since quitting: 28.0  . Smokeless tobacco: Never Used  . Tobacco comment: quit smoking in Mar 1993  Substance Use Topics  . Alcohol use: No    Alcohol/week: 0.0 standard drinks  . Drug use: No     Allergies   Doxycycline  Review of Systems Review of Systems  Musculoskeletal: Positive for gait problem and joint swelling.  All other systems reviewed and are negative.    Physical Exam Triage Vital Signs ED Triage Vitals  Enc Vitals Group     BP      Pulse      Resp      Temp      Temp src      SpO2      Weight      Height      Head Circumference      Peak Flow      Pain Score      Pain Loc      Pain Edu?      Excl. in [place]?    No data found.  Updated Vital Signs BP (!) 174/84 (BP Location: Left Arm)   Pulse 81   Temp 98.2 F (36.8 C) (Oral)   Resp 16   SpO2 100%    Physical Exam Vitals and nursing note reviewed.  Constitutional:      General: She is not in acute distress.    Appearance: Normal appearance. She is normal weight. She is not ill-appearing or toxic-appearing.  Eyes:     Conjunctiva/sclera: Conjunctivae normal.  Cardiovascular:     Rate and Rhythm: Normal rate.  Pulmonary:     Effort: Pulmonary effort is normal.  Musculoskeletal:        General: Tenderness and signs of injury present. Normal range of motion.     Cervical back: Normal range of motion and neck supple.  Skin:    General: Skin is warm and dry.     Findings: Erythema and lesion present.  Neurological:     General: No focal deficit present.     Mental Status: She is alert and oriented to person, place, and time.  Psychiatric:        Mood and Affect: Mood normal.        Behavior: Behavior normal.        UC Treatments / Results  Labs (all labs ordered are listed, but only abnormal results are displayed) Labs Reviewed - No data to display  EKG   Radiology CT from 7/19: IMPRESSION: 1. The adipose  tissue of the left groin hernia appears to arise lateral to the inferior epigastric vessels, most compatible with an indirect inguinal hernia. The hernia is relatively small. 2. Large type 3 hiatal hernia. 3. 1.2 cm in long axis enhancing lesion in segment 7 of liver favors a small vascular malformation given the feeding vessels. 4. A 2.8 cm segment of the dorsal pancreatic duct is dilated up to 0.8 cm in diameter. This was probably present 08/27/2013 although was somewhat striking. This could represent a short dilated segment due to a stricture or prior inflammation; I do not see an obvious mass. Addition has recurrent pancreatic issues or otherwise warranted, pancreatic protocol MRI with and without contrast could be utilized for further characterization. 5. Mild left hydronephrosis without hydroureter, query mild chronic UPJ narrowing. 6. Other imaging findings of potential clinical significance: Aortic Atherosclerosis (ICD10-I70.0). Coronary atherosclerosis. Mild aortic valve calcification. Mild scarring and cortical thinning of the right kidney. Levoconvex upper lumbar scoliosis. Mild left foraminal impingement at L3-4 and L4-5.   Electronically Signed   By: Van Clines M.D.   On: 07/18/2017 08:51 Procedures Procedures (including critical care time)  Medications Ordered in UC Medications - No data to display  Initial Impression / Assessment and Plan / UC  Course  I have reviewed the triage vital signs and the nursing notes.  Pertinent labs & imaging results that were available during my care of the patient were reviewed by me and considered in my medical decision making (see chart for details).    Final Clinical Impressions(s) / UC Diagnoses   Final diagnoses:  Cellulitis of right lower extremity     Discharge Instructions     Let's look at this in 48 hours to make sure the infected area is getting better.    ED Prescriptions    Medication Sig Dispense  Auth. Provider   cephALEXin (KEFLEX) 500 MG capsule Take 1 capsule (500 mg total) by mouth 3 (three) times daily. 30 capsule Robyn Haber, MD   mupirocin ointment (BACTROBAN) 2 % Apply 1 application topically 3 (three) times daily. 22 g Robyn Haber, MD     I have reviewed the PDMP during this encounter.   Robyn Haber, MD 03/31/19 1137

## 2019-03-31 NOTE — ED Triage Notes (Signed)
patient has an injury to right lower leg.  Patient was seen and treated at [facility] urgent care.  Patient has continued concerns about wound, reports wound weeping, more tender, swelling and redness.   Patient tried to make an appt with original location, but no one has returned her call.

## 2019-03-31 NOTE — Discharge Instructions (Addendum)
Let's look at this in 48 hours to make sure the infected area is getting better.

## 2019-04-02 ENCOUNTER — Ambulatory Visit (HOSPITAL_COMMUNITY)
Admission: EM | Admit: 2019-04-02 | Discharge: 2019-04-02 | Disposition: A | Discharge status: Home / Self Care | Payer: Medicare Other | Attending: Family Medicine | Admitting: Family Medicine

## 2019-04-02 ENCOUNTER — Other Ambulatory Visit: Payer: Self-pay

## 2019-04-02 ENCOUNTER — Encounter (HOSPITAL_COMMUNITY): Payer: Self-pay | Admitting: Family Medicine

## 2019-04-02 DIAGNOSIS — L03115 Cellulitis of right lower limb: Secondary | ICD-10-CM

## 2019-04-02 NOTE — ED Provider Notes (Signed)
Terri Bridges    CSN: [id number] Arrival date & time: 04/02/19  1526      History   Chief Complaint Chief Complaint  Patient presents with  . Wound Check    HPI Terri Bridges is a 85 y.o. female.   84 yo woman [place] trail[place] and lacerated her right lower extremity over a week ago.  She was seen here subsequently and thought to have cellulitis.  She was started on Bactroban and Keflex  Patient is here today for follow up from [facility] initial visit.   Initial injury was March 6th  Past Medical History:  Diagnosis Date  . Allergic rhinitis   . Allergy    uses Flonase daily as needed  . Anxiety    takes Xanax daily as needed  . Arthritis    "back" (04/21/2013)  . Chronic lower back pain   . Constipation    takes an OTC stool softener  . Depression    takes Zoloft daily  . Dry eyes    uses eye drops  . Exertional shortness of breath   . GERD (gastroesophageal reflux disease)    takes Omeprazole daily and Protonix daily as needed  . H/O hiatal hernia   . Headache(784.0)    sinus  . Heart murmur    years ago  . History of bronchitis    last time many yrs ago  . Hypertension    takes Losartan and Cardizem daily  . Hypothyroidism    takes Synthroid daily  . Joint pain   . Joint swelling   . Nocturia   . Osteoporosis   . Pressure in chest 08/23/13   fell and started having chest pressure, will have ECHO and stress test 09/04/13 before surgery  . Ringing in ears    sees Dr.Byers for this  . Scoliosis   . Urinary frequency   . Urinary incontinence   . Urinary urgency     Patient Active Problem List   Diagnosis Date Noted  . Asthma 03/26/2018  . Left inguinal hernia 08/05/2017  . Osteoarthritis of right knee 08/05/2017  . Chronic pansinusitis 05/31/2016  . Eustachian tube dysfunction, bilateral 05/31/2016  . Sensorineural hearing loss (SNHL), bilateral 05/31/2016  . Carpal tunnel syndrome 05/12/2015  . Degenerative arthritis of  finger 05/12/2015  . Primary arthrosis of first carpometacarpal joints, bilateral 05/12/2015  . Chronic kidney disease 05/22/2014  . Left knee pain 09/08/2013  . H/O total knee replacement 08/11/2013  . S/P total knee arthroplasty 04/21/2013  . Gonalgia 04/11/2013  . Osteoporosis, unspecified 03/24/2012  . At high risk for falls 02/12/2012  . Leg pain, bilateral 02/12/2012  . Anxiety 03/06/2011  . COPD (chronic obstructive pulmonary disease) ([place]) 01/08/2011  . Hypertension 01/05/2011  . Hypothyroidism 01/05/2011  . Recurrent sinusitis 01/05/2011  . Allergic rhinitis 01/05/2011  . Depression 01/05/2011  . GE reflux 01/05/2011    Past Surgical History:  Procedure Laterality Date  . CARDIAC CATHETERIZATION  2005  . CATARACT EXTRACTION W/ INTRAOCULAR LENS  IMPLANT, BILATERAL Bilateral   . COLONOSCOPY    . ESOPHAGOGASTRODUODENOSCOPY    . EXCISIONAL TOTAL KNEE ARTHROPLASTY Left 09/08/2013   Procedure: EXCISIONAL TOTAL KNEE ARTHROPLASTY POLYEXCHANGE;  Surgeon: Vickey Huger, MD;  Location: [place];  Service: Orthopedics;  Laterality: Left;  . EYE SURGERY    . JOINT REPLACEMENT    . KNEE ARTHROSCOPY Right   . NASAL SINUS SURGERY     x 4  . SHOULDER ARTHROSCOPY W/  ROTATOR CUFF REPAIR Right   . TONSILLECTOMY  1940's  . TOTAL KNEE ARTHROPLASTY Left 04/21/2013  . TOTAL KNEE ARTHROPLASTY Left 04/21/2013   Procedure: TOTAL KNEE ARTHROPLASTY;  Surgeon: Vickey Huger, MD;  Location: [place];  Service: Orthopedics;  Laterality: Left;  . TUBAL LIGATION      OB History   No obstetric history on file.      Home Medications    Prior to Admission medications   Medication Sig Start Date End Date Taking? Authorizing Provider  albuterol (PROVENTIL HFA;VENTOLIN HFA) 108 (90 BASE) MCG/ACT inhaler Inhale 2 puffs into the lungs every 6 (six) hours as needed for wheezing or shortness of breath. 06/22/14   Elby Showers, MD  albuterol (PROVENTIL) (2.5 MG/3ML) 0.083% nebulizer solution USE 1 VIAL IN  NEBULIZER EVERY 6 HOURS AS NEEDED FOR WHEEZING OR SHORTNESS OF BREATH 07/05/18   Elby Showers, MD  ALPRAZolam Duanne Moron) 0.5 MG tablet Take 1 tablet (0.5 mg total) by mouth at bedtime. 02/04/19   Elby Showers, MD  amLODipine (NORVASC) 5 MG tablet Take 1 tablet by mouth once daily 03/31/19   Elby Showers, MD  budesonide-formoterol [facility]) 160-4.5 MCG/ACT inhaler Inhale 2 puffs into the lungs 3 times/day as needed-between meals & bedtime. 03/26/18   Martyn Ehrich, NP  Calcium-Vitamin D (CALTRATE 600 PLUS-VIT D PO) Take 1 tablet by mouth daily.    [provider]  cephALEXin (KEFLEX) 500 MG capsule Take 1 capsule (500 mg total) by mouth 3 (three) times daily. 03/31/19   Robyn Haber, MD  furosemide (LASIX) 20 MG tablet Take 1 tablet by mouth once daily 11/20/18   Elby Showers, MD  ibandronate (BONIVA) 150 MG tablet Take 1 tablet (150 mg total) by mouth every 30 (thirty) days. Take in the morning with a full glass of water, on an empty stomach, and do not take anything else by mouth or lie down for the next 30 min. 11/04/18   Elby Showers, MD  levothyroxine (SYNTHROID) 100 MCG tablet Take 1 tablet by mouth once daily 03/18/19   Elby Showers, MD  losartan (COZAAR) 100 MG tablet Take 1 tablet by mouth once daily 11/20/18   Elby Showers, MD  mupirocin ointment (BACTROBAN) 2 % Apply 1 application topically 3 (three) times daily. 03/31/19   Robyn Haber, MD  Omega-3 Fatty Acids (FISH OIL PO) Take 1 capsule by mouth daily.    [provider]  omeprazole (PRILOSEC) 20 MG capsule Take 1 capsule by mouth once daily 08/01/18   Elby Showers, MD  PARoxetine (PAXIL) 10 MG tablet Take 1 tablet (10 mg total) by mouth daily. Patient taking differently: Take 10 mg by mouth daily. Not started taking 02/04/19   Elby Showers, MD    Family History Family History  Problem Relation Age of Onset  . Heart failure Mother   . Heart failure Father   . Hypertension Sister   . Neuropathy  Neg Hx     Social History Social History   Tobacco Use  . Smoking status: Former Smoker    Packs/day: 1.00    Years: 10.00    Pack years: 10.00    Types: Cigarettes    Quit date: 03/30/1991    Years since quitting: 28.0  . Smokeless tobacco: Never Used  . Tobacco comment: quit smoking in Mar 1993  Substance Use Topics  . Alcohol use: No    Alcohol/week: 0.0 standard drinks  . Drug use: No  Allergies   Doxycycline   Review of Systems Review of Systems   Physical Exam Triage Vital Signs ED Triage Vitals  Enc Vitals Group     BP      Pulse      Resp      Temp      Temp src      SpO2      Weight      Height      Head Circumference      Peak Flow      Pain Score      Pain Loc      Pain Edu?      Excl. in [place]?    No data found.  Updated Vital Signs BP (!) 142/70 (BP Location: Right Arm)   Pulse 88   Temp 98.4 F (36.9 C) (Oral)   Resp 18   Wt 68 kg   SpO2 97%   BMI 31.35 kg/m    Physical Exam Vitals and nursing note reviewed.  Constitutional:      Appearance: Normal appearance. She is normal weight.  HENT:     Head: Normocephalic.  Eyes:     Conjunctiva/sclera: Conjunctivae normal.  Pulmonary:     Effort: Pulmonary effort is normal.  Musculoskeletal:        General: Normal range of motion.     Cervical back: Normal range of motion and neck supple.  Skin:    General: Skin is warm and dry.  Neurological:     General: No focal deficit present.     Mental Status: She is alert.  Psychiatric:        Mood and Affect: Mood normal.        Behavior: Behavior normal.        Thought Content: Thought content normal.        Judgment: Judgment normal.        UC Treatments / Results  Labs (all labs ordered are listed, but only abnormal results are displayed) Labs Reviewed - No data to display  EKG   Radiology No results found.  Procedures Procedures (including critical care time)  Medications Ordered in UC Medications - No data to  display  Initial Impression / Assessment and Plan / UC Course  I have reviewed the triage vital signs and the nursing notes.  Pertinent labs & imaging results that were available during my care of the patient were reviewed by me and considered in my medical decision making (see chart for details).    Final Clinical Impressions(s) / UC Diagnoses   Final diagnoses:  Cellulitis of right lower extremity     Discharge Instructions     Continue your antibiotic cream and pills.  Let's check again after 4 pm on Friday.    ED Prescriptions    None     I have reviewed the PDMP during this encounter.   Robyn Haber, MD 04/02/19 1635

## 2019-04-02 NOTE — ED Triage Notes (Signed)
Pt states she was here on Monday for a wound check. Pt here today for the same thing. ( right leg wound ) Please see previous note.

## 2019-04-02 NOTE — Discharge Instructions (Addendum)
Continue your antibiotic cream and pills.  Let's check again after 4 pm on Friday.

## 2019-04-04 ENCOUNTER — Other Ambulatory Visit: Payer: Self-pay

## 2019-04-04 ENCOUNTER — Ambulatory Visit (HOSPITAL_COMMUNITY)
Admission: EM | Admit: 2019-04-04 | Discharge: 2019-04-04 | Disposition: A | Discharge status: Home / Self Care | Payer: Medicare Other

## 2019-04-04 ENCOUNTER — Ambulatory Visit
Admission: EM | Admit: 2019-04-04 | Discharge: 2019-04-04 | Disposition: A | Discharge status: Home / Self Care | Payer: Medicare Other | Attending: Family Medicine | Admitting: Family Medicine

## 2019-04-04 ENCOUNTER — Encounter: Payer: Self-pay | Admitting: Family Medicine

## 2019-04-04 ENCOUNTER — Encounter (HOSPITAL_COMMUNITY): Payer: Self-pay

## 2019-04-04 DIAGNOSIS — L03115 Cellulitis of right lower limb: Secondary | ICD-10-CM | POA: Diagnosis not present

## 2019-04-04 MED ORDER — AMOXICILLIN-POT CLAVULANATE 875-125 MG PO TABS: 1.0000 | ORAL_TABLET | Freq: Two times a day (BID) | ORAL | 0 refills | Status: DC

## 2019-04-04 NOTE — Discharge Instructions (Addendum)
Continue the Bactroban three times a day to get the dermabond glue to continue dissolving.  Stop the Keflex after tonight and start Doxycycline tomorrow.  Recheck at [facility]. Urgent Care next Thursday after 4

## 2019-04-04 NOTE — ED Triage Notes (Signed)
Pt here for wound check of RLE cellulitis. Was evaluated and tx her for same on 03/24. Was originally tx on 03/07 at [facility]. RLE edema, erythema noted. Pt states there has been no improvement to area.

## 2019-04-04 NOTE — ED Provider Notes (Signed)
[facility]    CSN: [id number] Arrival date & time: 04/04/19  [place]      History   Chief Complaint Chief Complaint  Patient presents with  . Follow-up    HPI Terri Bridges is a 85 y.o. female.   85 yo woman with right leg flap laceration on March 6th and this was repaired with steristrips and dermabond.  She was worried about redness in the area 4 days ago and was started on Keflex tid for presumed cellulitis, with Bactroban tid as well to start loosening the dermabond.  2 days ago the wound appeared stable with no drainage and about the same amount of swelling and erythema.  The glue had lessened in depth and the wound was nontender.  Last Tdap was 2015.  No real change in last two days.  Still very tender in center of wound.     Past Medical History:  Diagnosis Date  . Allergic rhinitis   . Allergy    uses Flonase daily as needed  . Anxiety    takes Xanax daily as needed  . Arthritis    "back" (04/21/2013)  . Chronic lower back pain   . Constipation    takes an OTC stool softener  . Depression    takes Zoloft daily  . Dry eyes    uses eye drops  . Exertional shortness of breath   . GERD (gastroesophageal reflux disease)    takes Omeprazole daily and Protonix daily as needed  . H/O hiatal hernia   . Headache(784.0)    sinus  . Heart murmur    years ago  . History of bronchitis    last time many yrs ago  . Hypertension    takes Losartan and Cardizem daily  . Hypothyroidism    takes Synthroid daily  . Joint pain   . Joint swelling   . Nocturia   . Osteoporosis   . Pressure in chest 08/23/13   fell and started having chest pressure, will have ECHO and stress test 09/04/13 before surgery  . Ringing in ears    sees Dr.Byers for this  . Scoliosis   . Urinary frequency   . Urinary incontinence   . Urinary urgency     Patient Active Problem List   Diagnosis Date Noted  . Asthma 03/26/2018  . Left inguinal hernia 08/05/2017  .  Osteoarthritis of right knee 08/05/2017  .    .    . Sensorineural hearing loss (SNHL), bilateral 05/31/2016    05/12/2015    05/12/2015  . Primary arthrosis of first carpometacarpal joints, bilateral 05/12/2015  .      09/08/2013    08/11/2013  . S/P total knee arthroplasty 04/21/2013    04/11/2013  . Osteoporosis, unspecified 03/24/2012  . At high risk for falls 02/12/2012    02/12/2012  . Anxiety 03/06/2011  . COPD (chronic obstructive pulmonary disease) ([place]) 01/08/2011  . Hypertension 01/05/2011  . Hypothyroidism 01/05/2011  . Recurrent sinusitis 01/05/2011    01/05/2011  . Depression 01/05/2011  . GE reflux 01/05/2011    Past Surgical History:  Procedure Laterality Date  . CARDIAC CATHETERIZATION  2005  . CATARACT EXTRACTION W/ INTRAOCULAR LENS  IMPLANT, BILATERAL Bilateral   . COLONOSCOPY    . ESOPHAGOGASTRODUODENOSCOPY    . EXCISIONAL TOTAL KNEE ARTHROPLASTY Left 09/08/2013   Procedure: EXCISIONAL TOTAL KNEE ARTHROPLASTY POLYEXCHANGE;  Surgeon: Vickey Huger, MD;  Location: [place];  Service: Orthopedics;  Laterality: Left;  . EYE SURGERY    .  JOINT REPLACEMENT    . KNEE ARTHROSCOPY Right   . NASAL SINUS SURGERY     x 4  . SHOULDER ARTHROSCOPY W/ ROTATOR CUFF REPAIR Right   . TONSILLECTOMY  1940's  . TOTAL KNEE ARTHROPLASTY Left 04/21/2013  . TOTAL KNEE ARTHROPLASTY Left 04/21/2013   Procedure: TOTAL KNEE ARTHROPLASTY;  Surgeon: Vickey Huger, MD;  Location: [place];  Service: Orthopedics;  Laterality: Left;  . TUBAL LIGATION      OB History   No obstetric history on file.      Home Medications    Prior to Admission medications   Medication Sig Start Date End Date Taking? Authorizing Provider  albuterol (PROVENTIL HFA;VENTOLIN HFA) 108 (90 BASE) MCG/ACT inhaler Inhale 2 puffs into the lungs every 6 (six) hours as needed for wheezing or shortness of breath. 06/22/14   Elby Showers, MD  albuterol (PROVENTIL) (2.5 MG/3ML) 0.083% nebulizer solution USE 1 VIAL IN  NEBULIZER EVERY 6 HOURS AS NEEDED FOR WHEEZING OR SHORTNESS OF BREATH 07/05/18   Elby Showers, MD  ALPRAZolam Duanne Moron) 0.5 MG tablet Take 1 tablet (0.5 mg total) by mouth at bedtime. 02/04/19   Elby Showers, MD  amLODipine (NORVASC) 5 MG tablet Take 1 tablet by mouth once daily 03/31/19   Elby Showers, MD  amoxicillin-clavulanate (AUGMENTIN) 875-125 MG tablet Take 1 tablet by mouth every 12 (twelve) hours. 04/04/19   Robyn Haber, MD  budesonide-formoterol (SYMBICORT) 160-4.5 MCG/ACT inhaler Inhale 2 puffs into the lungs 3 times/day as needed-between meals & bedtime. 03/26/18   Martyn Ehrich, NP  Calcium-Vitamin D (CALTRATE 600 PLUS-VIT D PO) Take 1 tablet by mouth daily.    [provider]  furosemide (LASIX) 20 MG tablet Take 1 tablet by mouth once daily 11/20/18   Elby Showers, MD  ibandronate (BONIVA) 150 MG tablet Take 1 tablet (150 mg total) by mouth every 30 (thirty) days. Take in the morning with a full glass of water, on an empty stomach, and do not take anything else by mouth or lie down for the next 30 min. 11/04/18   Elby Showers, MD  levothyroxine (SYNTHROID) 100 MCG tablet Take 1 tablet by mouth once daily 03/18/19   Elby Showers, MD  losartan (COZAAR) 100 MG tablet Take 1 tablet by mouth once daily 11/20/18   Elby Showers, MD  mupirocin ointment (BACTROBAN) 2 % Apply 1 application topically 3 (three) times daily. 03/31/19   Robyn Haber, MD  Omega-3 Fatty Acids (FISH OIL PO) Take 1 capsule by mouth daily.    [provider]  omeprazole (PRILOSEC) 20 MG capsule Take 1 capsule by mouth once daily 08/01/18   Elby Showers, MD  PARoxetine (PAXIL) 10 MG tablet Take 1 tablet (10 mg total) by mouth daily. Patient taking differently: Take 10 mg by mouth daily. Not started taking 02/04/19   Elby Showers, MD    Family History Family History  Problem Relation Age of Onset  . Heart failure Mother   . Heart failure Father   . Hypertension Sister   .  Neuropathy Neg Hx     Social History Social History   Tobacco Use  . Smoking status: Former Smoker    Packs/day: 1.00    Years: 10.00    Pack years: 10.00    Types: Cigarettes    Quit date: 03/30/1991    Years since quitting: 28.0  . Smokeless tobacco: Never Used  . Tobacco comment: quit smoking in  Mar 1993  Substance Use Topics  . Alcohol use: No    Alcohol/week: 0.0 standard drinks  . Drug use: No     Allergies   Doxycycline   Review of Systems Review of Systems  Skin: Positive for wound.  All other systems reviewed and are negative.    Physical Exam Triage Vital Signs ED Triage Vitals  Enc Vitals Group     BP      Pulse      Resp      Temp      Temp src      SpO2      Weight      Height      Head Circumference      Peak Flow      Pain Score      Pain Loc      Pain Edu?      Excl. in [place]?    No data found.  Updated Vital Signs BP (!) 170/78 (BP Location: Left Arm)   Pulse 83   Temp 98 F (36.7 C) (Oral)   Resp 16   SpO2 95%    Physical Exam Vitals and nursing note reviewed.  Constitutional:      Appearance: Normal appearance. She is normal weight.  Eyes:     Conjunctiva/sclera: Conjunctivae normal.  Pulmonary:     Effort: Pulmonary effort is normal.  Musculoskeletal:        General: Normal range of motion.     Cervical back: Normal range of motion and neck supple.  Skin:    General: Skin is warm and dry.  Neurological:     Mental Status: She is alert.        UC Treatments / Results  Labs (all labs ordered are listed, but only abnormal results are displayed) Labs Reviewed - No data to display  EKG   Radiology No results found.  Procedures Procedures (including critical care time)  Medications Ordered in UC Medications - No data to display  Initial Impression / Assessment and Plan / UC Course  I have reviewed the triage vital signs and the nursing notes.  Pertinent labs & imaging results that were available during  my care of the patient were reviewed by me and considered in my medical decision making (see chart for details).    Final Clinical Impressions(s) / UC Diagnoses   Final diagnoses:  Cellulitis of leg, right     Discharge Instructions     Continue the Bactroban three times a day to get the dermabond glue to continue dissolving.  Stop the Keflex after tonight and start Doxycycline tomorrow.  Recheck at [facility]. Urgent Care next Thursday after 4    ED Prescriptions    Medication Sig Dispense Auth. Provider   amoxicillin-clavulanate (AUGMENTIN) 875-125 MG tablet Take 1 tablet by mouth every 12 (twelve) hours. 14 tablet Robyn Haber, MD     I have reviewed the PDMP during this encounter.   Robyn Haber, MD 04/04/19 1924

## 2019-04-04 NOTE — ED Triage Notes (Signed)
Pt here for follow up for RLL wound. Denies any new problems, no drainage.

## 2019-04-10 ENCOUNTER — Other Ambulatory Visit: Payer: Self-pay

## 2019-04-10 ENCOUNTER — Ambulatory Visit (HOSPITAL_COMMUNITY)
Admission: EM | Admit: 2019-04-10 | Discharge: 2019-04-10 | Disposition: A | Discharge status: Home / Self Care | Payer: Medicare Other | Attending: Family Medicine | Admitting: Family Medicine

## 2019-04-10 DIAGNOSIS — Z5189 Encounter for other specified aftercare: Secondary | ICD-10-CM

## 2019-04-10 NOTE — ED Provider Notes (Signed)
[place]    CSN: [id number] Arrival date & time: 04/10/19  1618      History   Chief Complaint Chief Complaint  Patient presents with  . Wound Check    right    HPI Terri Bridges is a 85 y.o. female.   This is an 85 year old woman who comes in for wound check of the right anterior shin laceration which was repaired with glue and Steri-Strips about 10 days ago in the emergency department.  She had some redness and soreness in the surrounding area and was put on Keflex initially and then doxycycline 2 days ago.     Past Medical History:  Diagnosis Date  . Allergic rhinitis   . Allergy    uses Flonase daily as needed  . Anxiety    takes Xanax daily as needed  . Arthritis    "back" (04/21/2013)  . Chronic lower back pain   . Constipation    takes an OTC stool softener  . Depression    takes Zoloft daily  . Dry eyes    uses eye drops  . Exertional shortness of breath   . GERD (gastroesophageal reflux disease)    takes Omeprazole daily and Protonix daily as needed  . H/O hiatal hernia   . Headache(784.0)    sinus  . Heart murmur    years ago  . History of bronchitis    last time many yrs ago  . Hypertension    takes Losartan and Cardizem daily  . Hypothyroidism    takes Synthroid daily  . Joint pain   . Joint swelling   . Nocturia   . Osteoporosis   . Pressure in chest 08/23/13   fell and started having chest pressure, will have ECHO and stress test 09/04/13 before surgery  . Ringing in ears    sees Dr.Byers for this  . Scoliosis   . Urinary frequency   . Urinary incontinence   . Urinary urgency     Patient Active Problem List   Diagnosis Date Noted  . Asthma 03/26/2018  . Left inguinal hernia 08/05/2017  . Osteoarthritis of right knee 08/05/2017  . Chronic pansinusitis 05/31/2016  . Eustachian tube dysfunction, bilateral 05/31/2016  . Sensorineural hearing loss (SNHL), bilateral 05/31/2016  . Carpal tunnel syndrome 05/12/2015  .  Degenerative arthritis of finger 05/12/2015  . Primary arthrosis of first carpometacarpal joints, bilateral 05/12/2015  . Chronic kidney disease 05/22/2014  . Left knee pain 09/08/2013  . H/O total knee replacement 08/11/2013  . S/P total knee arthroplasty 04/21/2013  . Gonalgia 04/11/2013  . Osteoporosis, unspecified 03/24/2012  . At high risk for falls 02/12/2012  . Leg pain, bilateral 02/12/2012  . Anxiety 03/06/2011  . COPD (chronic obstructive pulmonary disease) ([place]) 01/08/2011  . Hypertension 01/05/2011  . Hypothyroidism 01/05/2011  . Recurrent sinusitis 01/05/2011  . Allergic rhinitis 01/05/2011  . Depression 01/05/2011  . GE reflux 01/05/2011    Past Surgical History:  Procedure Laterality Date  . CARDIAC CATHETERIZATION  2005  . CATARACT EXTRACTION W/ INTRAOCULAR LENS  IMPLANT, BILATERAL Bilateral   . COLONOSCOPY    . ESOPHAGOGASTRODUODENOSCOPY    . EXCISIONAL TOTAL KNEE ARTHROPLASTY Left 09/08/2013   Procedure: EXCISIONAL TOTAL KNEE ARTHROPLASTY POLYEXCHANGE;  Surgeon: Vickey Huger, MD;  Location: [facility];  Service: Orthopedics;  Laterality: Left;  . EYE SURGERY    . JOINT REPLACEMENT    . KNEE ARTHROSCOPY Right   . NASAL SINUS SURGERY     x  4  . SHOULDER ARTHROSCOPY W/ ROTATOR CUFF REPAIR Right   . TONSILLECTOMY  1940's  . TOTAL KNEE ARTHROPLASTY Left 04/21/2013  . TOTAL KNEE ARTHROPLASTY Left 04/21/2013   Procedure: TOTAL KNEE ARTHROPLASTY;  Surgeon: Vickey Huger, MD;  Location: [facility];  Service: Orthopedics;  Laterality: Left;  . TUBAL LIGATION      OB History   No obstetric history on file.      Home Medications    Prior to Admission medications   Medication Sig Start Date End Date Taking? Authorizing Provider  albuterol (PROVENTIL HFA;VENTOLIN HFA) 108 (90 BASE) MCG/ACT inhaler Inhale 2 puffs into the lungs every 6 (six) hours as needed for wheezing or shortness of breath. 06/22/14   Elby Showers, MD  albuterol (PROVENTIL) (2.5 MG/3ML) 0.083% nebulizer  solution USE 1 VIAL IN NEBULIZER EVERY 6 HOURS AS NEEDED FOR WHEEZING OR SHORTNESS OF BREATH 07/05/18   Elby Showers, MD  ALPRAZolam Duanne Moron) 0.5 MG tablet Take 1 tablet (0.5 mg total) by mouth at bedtime. 02/04/19   Elby Showers, MD  amLODipine (NORVASC) 5 MG tablet Take 1 tablet by mouth once daily 03/31/19   Elby Showers, MD  amoxicillin-clavulanate (AUGMENTIN) 875-125 MG tablet Take 1 tablet by mouth every 12 (twelve) hours. 04/04/19   Robyn Haber, MD  budesonide-formoterol (SYMBICORT) 160-4.5 MCG/ACT inhaler Inhale 2 puffs into the lungs 3 times/day as needed-between meals & bedtime. 03/26/18   Martyn Ehrich, NP  Calcium-Vitamin D (CALTRATE 600 PLUS-VIT D PO) Take 1 tablet by mouth daily.    [provider]  furosemide (LASIX) 20 MG tablet Take 1 tablet by mouth once daily 11/20/18   Elby Showers, MD  ibandronate (BONIVA) 150 MG tablet Take 1 tablet (150 mg total) by mouth every 30 (thirty) days. Take in the morning with a full glass of water, on an empty stomach, and do not take anything else by mouth or lie down for the next 30 min. 11/04/18   Elby Showers, MD  levothyroxine (SYNTHROID) 100 MCG tablet Take 1 tablet by mouth once daily 03/18/19   Elby Showers, MD  losartan (COZAAR) 100 MG tablet Take 1 tablet by mouth once daily 11/20/18   Elby Showers, MD  mupirocin ointment (BACTROBAN) 2 % Apply 1 application topically 3 (three) times daily. 03/31/19   Robyn Haber, MD  Omega-3 Fatty Acids (FISH OIL PO) Take 1 capsule by mouth daily.    [provider]  omeprazole (PRILOSEC) 20 MG capsule Take 1 capsule by mouth once daily 08/01/18   Elby Showers, MD  PARoxetine (PAXIL) 10 MG tablet Take 1 tablet (10 mg total) by mouth daily. Patient taking differently: Take 10 mg by mouth daily. Not started taking 02/04/19   Elby Showers, MD    Family History Family History  Problem Relation Age of Onset  . Heart failure Mother   . Heart failure Father   .  Hypertension Sister   . Neuropathy Neg Hx     Social History Social History   Tobacco Use  . Smoking status: Former Smoker    Packs/day: 1.00    Years: 10.00    Pack years: 10.00    Types: Cigarettes    Quit date: 03/30/1991    Years since quitting: 28.0  . Smokeless tobacco: Never Used  . Tobacco comment: quit smoking in Mar 1993  Substance Use Topics  . Alcohol use: No    Alcohol/week: 0.0 standard drinks  .  Drug use: No     Allergies   Doxycycline   Review of Systems Review of Systems   Physical Exam Triage Vital Signs ED Triage Vitals  Enc Vitals Group     BP 04/10/19 1655 (!) 154/77     Pulse Rate 04/10/19 1655 77     Resp 04/10/19 1655 16     Temp 04/10/19 1655 98.1 F (36.7 C)     Temp Source 04/10/19 1655 Oral     SpO2 04/10/19 1655 100 %     Weight --      Height --      Head Circumference --      Peak Flow --      Pain Score 04/10/19 1656 6     Pain Loc --      Pain Edu? --      Excl. in [place]? --    No data found.  Updated Vital Signs BP (!) 154/77 (BP Location: Right Arm)   Pulse 77   Temp 98.1 F (36.7 C) (Oral)   Resp 16   SpO2 100%    Physical Exam Vitals and nursing note reviewed.  Constitutional:      General: She is not in acute distress.    Appearance: Normal appearance. She is normal weight.  Pulmonary:     Effort: Pulmonary effort is normal.  Musculoskeletal:        General: Normal range of motion.     Cervical back: Normal range of motion and neck supple.  Skin:    General: Skin is warm and dry.  Neurological:     General: No focal deficit present.     Mental Status: She is alert.  Psychiatric:        Mood and Affect: Mood normal.        UC Treatments / Results  Labs (all labs ordered are listed, but only abnormal results are displayed) Labs Reviewed - No data to display  EKG   Radiology No results found.  Procedures Procedures (including critical care time)  Medications Ordered in UC Medications -  No data to display  Initial Impression / Assessment and Plan / UC Course  I have reviewed the triage vital signs and the nursing notes.  Pertinent labs & imaging results that were available during my care of the patient were reviewed by me and considered in my medical decision making (see chart for details).    Final Clinical Impressions(s) / UC Diagnoses   Final diagnoses:  Visit for wound check     Discharge Instructions     Continue current regimen of warm compresses, Bactroban, and doxycycline.  Return for worsening symptoms    ED Prescriptions    None     I have reviewed the PDMP during this encounter.   Robyn Haber, MD 04/10/19 1721

## 2019-04-10 NOTE — ED Triage Notes (Signed)
Pt is following up on her right leg wound from a previous fall. Pt states that her leg is still in pain.

## 2019-04-10 NOTE — Discharge Instructions (Addendum)
Continue current regimen of warm compresses, Bactroban, and doxycycline.  Return for worsening symptoms

## 2019-04-24 DIAGNOSIS — H26493 Other secondary cataract, bilateral: Secondary | ICD-10-CM | POA: Diagnosis not present

## 2019-04-24 DIAGNOSIS — H04123 Dry eye syndrome of bilateral lacrimal glands: Secondary | ICD-10-CM | POA: Diagnosis not present

## 2019-04-24 DIAGNOSIS — H5213 Myopia, bilateral: Secondary | ICD-10-CM | POA: Diagnosis not present

## 2019-04-24 DIAGNOSIS — H43813 Vitreous degeneration, bilateral: Secondary | ICD-10-CM | POA: Diagnosis not present

## 2019-05-01 ENCOUNTER — Telehealth: Payer: Self-pay

## 2019-05-01 ENCOUNTER — Encounter: Payer: Self-pay | Admitting: Internal Medicine

## 2019-05-01 ENCOUNTER — Ambulatory Visit (INDEPENDENT_AMBULATORY_CARE_PROVIDER_SITE_OTHER): Payer: Medicare Other | Admitting: Internal Medicine

## 2019-05-01 ENCOUNTER — Other Ambulatory Visit: Payer: Self-pay

## 2019-05-01 VITALS — BP 100/60 | HR 76 | Temp 98.6°F | Ht <= 58 in | Wt 150.0 lb

## 2019-05-01 DIAGNOSIS — R8290 Unspecified abnormal findings in urine: Secondary | ICD-10-CM | POA: Diagnosis not present

## 2019-05-01 DIAGNOSIS — R300 Dysuria: Secondary | ICD-10-CM | POA: Diagnosis not present

## 2019-05-01 DIAGNOSIS — R350 Frequency of micturition: Secondary | ICD-10-CM

## 2019-05-01 DIAGNOSIS — N390 Urinary tract infection, site not specified: Secondary | ICD-10-CM

## 2019-05-01 DIAGNOSIS — Z0000 Encounter for general adult medical examination without abnormal findings: Secondary | ICD-10-CM | POA: Diagnosis not present

## 2019-05-01 LAB — POCT URINALYSIS DIPSTICK
Bilirubin, UA: NEGATIVE
Glucose, UA: NEGATIVE
Ketones, UA: NEGATIVE
Nitrite, UA: NEGATIVE
Protein, UA: POSITIVE — AB
Spec Grav, UA: 1.01
Urobilinogen, UA: 0.2 U/dL
pH, UA: 6.5

## 2019-05-01 MED ORDER — CEFTRIAXONE SODIUM 1 G IJ SOLR
1.0000 g | Freq: Once | INTRAMUSCULAR | Status: AC
  Administered 2019-05-01: 17:00:00 1 g via INTRAMUSCULAR

## 2019-05-01 MED ORDER — LEVOFLOXACIN 500 MG PO TABS: 500.0000 mg | ORAL_TABLET | Freq: Every day | ORAL | 0 refills | Status: DC

## 2019-05-01 NOTE — Addendum Note (Signed)
Addended by: Mady Haagensen on: 05/01/2019 04:49 PM   Modules accepted: Orders

## 2019-05-01 NOTE — Telephone Encounter (Signed)
She is coming in at 72 today 05-01-19

## 2019-05-01 NOTE — Telephone Encounter (Signed)
Patient called wanted to come in to be seen for kidney infection, started a couple days ago.  Please review and advise.  Thank you

## 2019-05-01 NOTE — Progress Notes (Signed)
   Subjective:    Patient ID: Terri Bridges, female    DOB: Nov 15, 1934, 85 y.o.   MRN: [id number]  HPI 3 day history of UTI symptoms.  Has had dysuria and frequency.  Up at night urinating.  No nausea vomiting fever or shaking chills.  Has had some lower back pain.  Chart review shows no recent urinary tract infection over several years.  No hematuria    Review of Systems see above     Objective:   Physical Exam  Blood pressure 100/60 pulse 76 temperature 98.6 degrees orally pulse oximetry 98% weight 150 pounds BMI 31.45 No CVA tenderness.  No acute distress.     Assessment & Plan:  3-day history of urinary tract infection symptoms an 85 year old Female  Acute UTI  Plan: 1 g IM Rocephin.  Urine culture sent.  Levaquin 500 mg daily for 10 days.  Follow-up here in 10 days with urine recheck and nurse visit.  Call if symptoms worsen or do not improve within 48 hours.

## 2019-05-01 NOTE — Telephone Encounter (Signed)
May come this afternoon

## 2019-05-01 NOTE — Patient Instructions (Signed)
It was a pleasure to see you today.  We are sorry you are ill.  Rocephin 1 g IM given in office.  Take Levaquin 500 mg daily for 10 days and return in 10 days for follow-up nurse visit with urine recheck.  Call if symptoms worsen.

## 2019-05-03 ENCOUNTER — Encounter: Payer: Self-pay | Admitting: Internal Medicine

## 2019-05-03 LAB — URINE CULTURE
MICRO NUMBER:: 10394578
SPECIMEN QUALITY:: ADEQUATE

## 2019-05-03 NOTE — Patient Instructions (Addendum)
It was a pleasure to see you today.  Take care of laceration of right leg as previously directed by urgent care.  Follow-up here in 6 months or as needed.

## 2019-05-13 ENCOUNTER — Encounter: Payer: Self-pay | Admitting: Internal Medicine

## 2019-05-13 ENCOUNTER — Other Ambulatory Visit: Payer: Self-pay

## 2019-05-13 ENCOUNTER — Ambulatory Visit (INDEPENDENT_AMBULATORY_CARE_PROVIDER_SITE_OTHER): Payer: Medicare Other | Admitting: Internal Medicine

## 2019-05-13 VITALS — BP 120/88 | HR 66 | Temp 98.3°F | Ht <= 58 in | Wt 153.0 lb

## 2019-05-13 DIAGNOSIS — M546 Pain in thoracic spine: Secondary | ICD-10-CM | POA: Diagnosis not present

## 2019-05-13 DIAGNOSIS — F329 Major depressive disorder, single episode, unspecified: Secondary | ICD-10-CM

## 2019-05-13 DIAGNOSIS — G8929 Other chronic pain: Secondary | ICD-10-CM

## 2019-05-13 DIAGNOSIS — N390 Urinary tract infection, site not specified: Secondary | ICD-10-CM | POA: Diagnosis not present

## 2019-05-13 DIAGNOSIS — F419 Anxiety disorder, unspecified: Secondary | ICD-10-CM

## 2019-05-13 LAB — POCT URINALYSIS DIPSTICK
Appearance: NEGATIVE
Bilirubin, UA: NEGATIVE
Glucose, UA: NEGATIVE
Ketones, UA: NEGATIVE
Nitrite, UA: NEGATIVE
Odor: NEGATIVE
Protein, UA: NEGATIVE
Spec Grav, UA: 1.01 (ref 1.010–1.025)
Urobilinogen, UA: 0.2 E.U./dL
pH, UA: 6 (ref 5.0–8.0)

## 2019-05-13 MED ORDER — LEVOFLOXACIN 500 MG PO TABS: 500.0000 mg | ORAL_TABLET | Freq: Every day | ORAL | 0 refills | Status: DC

## 2019-05-13 MED ORDER — TRAMADOL HCL 50 MG PO TABS: 50.0000 mg | ORAL_TABLET | Freq: Three times a day (TID) | ORAL | 0 refills | Status: AC | PRN

## 2019-05-13 NOTE — Progress Notes (Signed)
   Subjective:    Patient ID: Terri Bridges, female    DOB: 07/21/1934, 85 y.o.   MRN: [id number]  HPI 85 year old Female in today to follow-up on recent urinary tract infection.  Says she feels somewhat better with less dysuria but not completely well.  She began to cry today and said her back hurt.  She does have a history of musculoskeletal pain.  At last visit she was treated with 10 days of Levaquin 500 mg daily and culture grew E. coli sensitive to Levaquin.  Dipstick UA today shows trace LE.  In 2003, she had T-spine films showing minimal spondylosis. In 2018 she had CT of the chest with no evidence of tumor. History of mild loss of height superior endplate of T3 and T4. Degenerative changes throughout the lower thoracic spine and upper lumbar spine. History of biapical pleural scarring. She is a former smoker. History of large hernia. History of osteoporosis. History of ascending thoracic aorta measuring 3.8 cm unchanged in 2018 from previous studies.  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      Review of Systems see above     Objective:   Physical Exam Blood pressure 120/88, pulse 66, temperature 98.3 degrees orally pulse oximetry 97% weight 153 pounds BMI 31.98. Weight is stable from March.  Urine dipstick is abnormal. We will continue with another 7 days of Levaquin.  Regarding back pain we have prescribed tramadol for 5 days. Prefer that she only take it twice daily but she may take it up to 3 times daily if necessary. Should take it with food. If tramadol helps we may need to refer her to chronic pain management for back pain.       Assessment & Plan:  Status post treatment for UTI-still has abnormal urine dipstick and will prescribe Levaquin for an additional 7 days  Thoracic back pain-have prescribed tramadol to take 2-3 times daily as needed.  ? Depression-seems a bit depressed-is on Paxil 10 mg daily and Xanax.

## 2019-05-13 NOTE — Patient Instructions (Addendum)
Levaquin 500 mg daily for 7 days. Still has some UTI symptoms. Take Tramadol sparingly with food for back pain.

## 2019-06-03 ENCOUNTER — Other Ambulatory Visit: Payer: Self-pay | Admitting: Internal Medicine

## 2019-06-18 ENCOUNTER — Encounter (HOSPITAL_COMMUNITY): Payer: Self-pay

## 2019-06-18 ENCOUNTER — Ambulatory Visit (HOSPITAL_COMMUNITY)
Admission: EM | Admit: 2019-06-18 | Discharge: 2019-06-18 | Disposition: A | Discharge status: Home / Self Care | Payer: Medicare Other

## 2019-06-18 ENCOUNTER — Other Ambulatory Visit: Payer: Self-pay

## 2019-06-18 DIAGNOSIS — W57XXXA Bitten or stung by nonvenomous insect and other nonvenomous arthropods, initial encounter: Secondary | ICD-10-CM

## 2019-06-18 DIAGNOSIS — R21 Rash and other nonspecific skin eruption: Secondary | ICD-10-CM | POA: Diagnosis not present

## 2019-06-18 NOTE — ED Triage Notes (Signed)
Pt presents to UC with a tick bite in the back of the right calf and abdomen since yesterday. Pt denies any symptoms.

## 2019-06-18 NOTE — Discharge Instructions (Addendum)
Use bactroban ointment 3 times daily for 1 week.

## 2019-06-18 NOTE — ED Provider Notes (Signed)
[place]   MRN: [id number] DOB: February 23, 1934  Subjective:   Terri Bridges is a 85 y.o. female presenting for 1 day history stinging rash on her bellybutton.  Patient regularly checks for tick bites and spends a lot of time outdoors recently has to pull them off of her skin regularly.  She did have one over the right posterior medial calf that she removed yesterday.  However she is concerned that she has some pain her denies fevers, headaches sore throat.  She did try some leftover mupirocin over the area with some relief.  No current facility-administered medications for this encounter.  Current Outpatient Medications:  .  albuterol (PROVENTIL HFA;VENTOLIN HFA) 108 (90 BASE) MCG/ACT inhaler, Inhale 2 puffs into the lungs every 6 (six) hours as needed for wheezing or shortness of breath., Disp: 1 Inhaler, Rfl: 3 .  albuterol (PROVENTIL) (2.5 MG/3ML) 0.083% nebulizer solution, USE 1 VIAL IN NEBULIZER EVERY 6 HOURS AS NEEDED FOR WHEEZING OR SHORTNESS OF BREATH, Disp: 150 mL, Rfl: prn .  ALPRAZolam (XANAX) 0.5 MG tablet, Take 1 tablet (0.5 mg total) by mouth at bedtime., Disp: 30 tablet, Rfl: 1 .  amLODipine (NORVASC) 5 MG tablet, Take 1 tablet by mouth once daily, Disp: 90 tablet, Rfl: 3 .  budesonide-formoterol (SYMBICORT) 160-4.5 MCG/ACT inhaler, Inhale 2 puffs into the lungs 3 times/day as needed-between meals & bedtime., Disp: 1 Inhaler, Rfl: 3 .  Calcium-Vitamin D (CALTRATE 600 PLUS-VIT D PO), Take 1 tablet by mouth daily., Disp: , Rfl:  .  furosemide (LASIX) 20 MG tablet, Take 1 tablet by mouth once daily, Disp: 90 tablet, Rfl: 1 .  ibandronate (BONIVA) 150 MG tablet, Take 1 tablet (150 mg total) by mouth every 30 (thirty) days. Take in the morning with a full glass of water, on an empty stomach, and do not take anything else by mouth or lie down for the next 30 min., Disp: 1 tablet, Rfl: 11 .  levofloxacin (LEVAQUIN) 500 MG tablet, Take 1 tablet (500 mg total) by mouth daily.,  Disp: 7 tablet, Rfl: 0 .  levothyroxine (SYNTHROID) 100 MCG tablet, Take 1 tablet by mouth once daily, Disp: 90 tablet, Rfl: 1 .  losartan (COZAAR) 100 MG tablet, Take 1 tablet by mouth once daily, Disp: 90 tablet, Rfl: 3 .  mupirocin ointment (BACTROBAN) 2 %, Apply 1 application topically 3 (three) times daily., Disp: 22 g, Rfl: 1 .  Omega-3 Fatty Acids (FISH OIL PO), Take 1 capsule by mouth daily., Disp: , Rfl:  .  omeprazole (PRILOSEC) 20 MG capsule, Take 1 capsule by mouth once daily, Disp: 90 capsule, Rfl: 3 .  PARoxetine (PAXIL) 10 MG tablet, Take 1 tablet (10 mg total) by mouth daily. (Patient taking differently: Take 10 mg by mouth daily. Not started taking), Disp: 30 tablet, Rfl: 3   Allergies  Allergen Reactions  . Doxycycline Rash    Past Medical History:  Diagnosis Date  . Allergic rhinitis   . Allergy    uses Flonase daily as needed  . Anxiety    takes Xanax daily as needed  . Arthritis    "back" (04/21/2013)  . Chronic lower back pain   . Constipation    takes an OTC stool softener  . Depression    takes Zoloft daily  . Dry eyes    uses eye drops  . Exertional shortness of breath   . GERD (gastroesophageal reflux disease)    takes Omeprazole daily and Protonix daily as needed  .  H/O hiatal hernia   . Headache(784.0)    sinus  . Heart murmur    years ago  . History of bronchitis    last time many yrs ago  . Hypertension    takes Losartan and Cardizem daily  . Hypothyroidism    takes Synthroid daily  . Joint pain   . Joint swelling   . Nocturia   . Osteoporosis   . Pressure in chest 08/23/13   fell and started having chest pressure, will have ECHO and stress test 09/04/13 before surgery  . Ringing in ears    sees Dr.Byers for this  . Scoliosis   . Urinary frequency   . Urinary incontinence   . Urinary urgency      Past Surgical History:  Procedure Laterality Date  . CARDIAC CATHETERIZATION  2005  . CATARACT EXTRACTION W/ INTRAOCULAR LENS  IMPLANT,  BILATERAL Bilateral   . COLONOSCOPY    . ESOPHAGOGASTRODUODENOSCOPY    . EXCISIONAL TOTAL KNEE ARTHROPLASTY Left 09/08/2013   Procedure: EXCISIONAL TOTAL KNEE ARTHROPLASTY POLYEXCHANGE;  Surgeon: Vickey Huger, MD;  Location: [facility];  Service: Orthopedics;  Laterality: Left;  . EYE SURGERY    . JOINT REPLACEMENT    . KNEE ARTHROSCOPY Right   . NASAL SINUS SURGERY     x 4  . SHOULDER ARTHROSCOPY W/ ROTATOR CUFF REPAIR Right   . TONSILLECTOMY  1940's  . TOTAL KNEE ARTHROPLASTY Left 04/21/2013  . TOTAL KNEE ARTHROPLASTY Left 04/21/2013   Procedure: TOTAL KNEE ARTHROPLASTY;  Surgeon: Vickey Huger, MD;  Location: [facility];  Service: Orthopedics;  Laterality: Left;  . TUBAL LIGATION      Family History  Problem Relation Age of Onset  . Heart failure Mother   . Heart failure Father   . Hypertension Sister   . Neuropathy Neg Hx     Social History   Tobacco Use  . Smoking status: Former Smoker    Packs/day: 1.00    Years: 10.00    Pack years: 10.00    Types: Cigarettes    Quit date: 03/30/1991    Years since quitting: 28.2  . Smokeless tobacco: Never Used  . Tobacco comment: quit smoking in Mar 1993  Substance Use Topics  . Alcohol use: No    Alcohol/week: 0.0 standard drinks  . Drug use: No    ROS   Objective:   Vitals: BP (!) 153/87 (BP Location: Left Arm)   Pulse 85   Temp 98.2 F (36.8 C) (Oral)   Resp 16   SpO2 95%   Physical Exam Constitutional:      General: She is not in acute distress.    Appearance: Normal appearance. She is well-developed. She is not ill-appearing, toxic-appearing or diaphoretic.  HENT:     Head: Normocephalic and atraumatic.     Nose: Nose normal.     Mouth/Throat:     Mouth: Mucous membranes are moist.     Pharynx: Oropharynx is clear.  Eyes:     General: No scleral icterus.    Extraocular Movements: Extraocular movements intact.     Pupils: Pupils are equal, round, and reactive to light.  Cardiovascular:     Rate and Rhythm: Normal  rate.  Pulmonary:     Effort: Pulmonary effort is normal.  Abdominal:     General: Bowel sounds are normal. There is no distension.     Palpations: Abdomen is soft. There is no mass.     Tenderness: There is abdominal tenderness (About  the umbilicus with surrounding erythema bilaterally). There is no guarding or rebound.  Skin:    General: Skin is warm and dry.       Neurological:     General: No focal deficit present.     Mental Status: She is alert and oriented to person, place, and time.  Psychiatric:        Mood and Affect: Mood normal.        Behavior: Behavior normal.        Thought Content: Thought content normal.        Judgment: Judgment normal.    I used a Q-tip to clean out the bellybutton as there was a collection of dead skin cells.  It was clear thereafter.  No evidence of attack.  Assessment and Plan :   PDMP not reviewed this encounter.  1. Rash   2. Tick bite, initial encounter     Reassured patient that there is no tick.  Recommend that she use mupirocin about the bellybutton.  Offered her a refill but she refused.  Use conservative management otherwise.  Monitor for signs and symptoms of tickborne illness given her right posterior calf tick bite. Counseled patient on potential for adverse effects with medications prescribed/recommended today, ER and return-to-clinic precautions discussed, patient verbalized understanding.    Jaynee Eagles, [place] 06/18/19 [phone]

## 2019-07-02 ENCOUNTER — Other Ambulatory Visit: Payer: Self-pay | Admitting: Internal Medicine

## 2019-07-17 ENCOUNTER — Ambulatory Visit (INDEPENDENT_AMBULATORY_CARE_PROVIDER_SITE_OTHER): Payer: Medicare Other | Admitting: Internal Medicine

## 2019-07-17 ENCOUNTER — Telehealth: Payer: Self-pay

## 2019-07-17 ENCOUNTER — Encounter: Payer: Self-pay | Admitting: Internal Medicine

## 2019-07-17 ENCOUNTER — Other Ambulatory Visit: Payer: Self-pay

## 2019-07-17 VITALS — BP 110/80 | HR 88 | Temp 97.9°F | Ht <= 58 in | Wt 153.0 lb

## 2019-07-17 DIAGNOSIS — M545 Low back pain, unspecified: Secondary | ICD-10-CM

## 2019-07-17 DIAGNOSIS — G8929 Other chronic pain: Secondary | ICD-10-CM

## 2019-07-17 DIAGNOSIS — Z8709 Personal history of other diseases of the respiratory system: Secondary | ICD-10-CM

## 2019-07-17 DIAGNOSIS — J22 Unspecified acute lower respiratory infection: Secondary | ICD-10-CM

## 2019-07-17 MED ORDER — HYDROCOD POLST-CPM POLST ER 10-8 MG/5ML PO SUER: 5.0000 mL | Freq: Two times a day (BID) | ORAL | 0 refills | Status: DC | PRN

## 2019-07-17 MED ORDER — LEVOFLOXACIN 500 MG PO TABS: 500.0000 mg | ORAL_TABLET | Freq: Every day | ORAL | 0 refills | Status: DC

## 2019-07-17 MED ORDER — TRAMADOL HCL 50 MG PO TABS: 50.0000 mg | ORAL_TABLET | Freq: Three times a day (TID) | ORAL | 0 refills | Status: AC | PRN

## 2019-07-17 MED ORDER — PREDNISONE 10 MG PO TABS: ORAL_TABLET | ORAL | 0 refills | Status: DC

## 2019-07-17 MED ORDER — CEFTRIAXONE SODIUM 1 G IJ SOLR
1.0000 g | Freq: Once | INTRAMUSCULAR | Status: AC
  Administered 2019-07-17: 1 g via INTRAMUSCULAR

## 2019-07-17 NOTE — Telephone Encounter (Signed)
See today at 2:30pm

## 2019-07-17 NOTE — Progress Notes (Signed)
   Subjective:    Patient ID: Terri Bridges, female    DOB: 1934/09/06, 85 y.o.   MRN: [id number]  HPI 85 year old Female with history of COPD and history of chronic recurrent sinusitis, hypertension, hypothyroidism, allergic rhinitis, GE reflux, osteoporosis presents with cough, head and chest congestion and difficulty sleeping due to congestion.  Says she has had the symptoms now for over a week.  She has been vaccinated for COVID-19 in February of this year.  She received the majority of vaccine.  She has received Prevnar 13 in 2017 and pneumococcal 23 in 2005.  Receives annual flu vaccine in the Fall.  She lives in a mobile home.  Denies any recent travel.  No known COVID-19 exposure.  Says she has been fairly careful and has avoided close contact particularly with people that are ill.  She resides alone.    Review of Systems denies fever and shaking chills.  Cough is productive she says.  Has fatigue and malaise. Issues with chronic back pain and is asking for refill on tramadol which was provided.    Objective:   Physical Exam Blood pressure 110/80 pulse 88 BMI 31.98 pulse oximetry 97% weight 153 pounds  Skin pale warm and dry.  She is coughing a lot in the office.  TMs are chronically scarred.  Pharynx is clear.  Neck is supple without adenopathy.  Chest clear to auscultation without rales or significant wheezing.       Assessment & Plan:  Acute lower respiratory infection  COPD  History of recurrent maxillary sinusitis  Chronic low back pain  Plan: Tramadol refilled for back pain.  Have prescribed Tussionex 1 teaspoon p.o. every 12 hours as needed cough.  Prednisone 10 mg (number 21 tablets) to take in tapering course as directed starting with 60 mg day 1 and decreasing by 10 mg daily.  Levaquin 500 mg daily for 10 days.  1 g IM Rocephin.  Call if symptoms worsen or not improved within 48 hours.  Rest and drink plenty of fluids.  Sample of oral inhaler given to patient today.   Use albuterol nebulizer if needed.

## 2019-07-17 NOTE — Telephone Encounter (Signed)
Patient calling and would like to schedule an appointment. She is complaining of a cough, chest heavy, head congestion, and she has to sleep sitting up. States that this is the 2nd week. She has been vaccinated. CB#: [phone]

## 2019-07-17 NOTE — Patient Instructions (Addendum)
Levaquin 500 mg daily for 10 days.  Use albuterol nebulizer if necessary.  Take prednisone in tapering course as directed.  Sample of oral inhaler given to patient today.  Take Tussionex sparingly for cough.  Take tramadol sparingly for back pain.  Rocephin 1 g IM given in office.

## 2019-08-30 ENCOUNTER — Other Ambulatory Visit: Payer: Self-pay | Admitting: Internal Medicine

## 2019-09-05 ENCOUNTER — Encounter: Payer: Self-pay | Admitting: Internal Medicine

## 2019-09-05 ENCOUNTER — Telehealth: Payer: Self-pay

## 2019-09-05 ENCOUNTER — Ambulatory Visit (INDEPENDENT_AMBULATORY_CARE_PROVIDER_SITE_OTHER): Payer: Medicare Other | Admitting: Internal Medicine

## 2019-09-05 ENCOUNTER — Other Ambulatory Visit: Payer: Self-pay

## 2019-09-05 VITALS — BP 110/70 | HR 92 | Temp 98.5°F | Ht <= 58 in | Wt 150.0 lb

## 2019-09-05 DIAGNOSIS — L03116 Cellulitis of left lower limb: Secondary | ICD-10-CM | POA: Diagnosis not present

## 2019-09-05 DIAGNOSIS — M7989 Other specified soft tissue disorders: Secondary | ICD-10-CM | POA: Diagnosis not present

## 2019-09-05 DIAGNOSIS — S81802A Unspecified open wound, left lower leg, initial encounter: Secondary | ICD-10-CM

## 2019-09-05 LAB — CBC WITH DIFFERENTIAL/PLATELET
Absolute Monocytes: 519 cells/uL (ref 200–950)
Basophils Absolute: 40 cells/uL (ref 0–200)
Basophils Relative: 0.7 %
Eosinophils Absolute: 148 cells/uL (ref 15–500)
Eosinophils Relative: 2.6 %
HCT: 42.3 % (ref 35.0–45.0)
Hemoglobin: 13.8 g/dL (ref 11.7–15.5)
Lymphs Abs: 1585 cells/uL (ref 850–3900)
MCH: 27 pg (ref 27.0–33.0)
MCHC: 32.6 g/dL (ref 32.0–36.0)
MCV: 82.8 fL (ref 80.0–100.0)
MPV: 11 fL (ref 7.5–12.5)
Monocytes Relative: 9.1 %
Neutro Abs: 3409 cells/uL (ref 1500–7800)
Neutrophils Relative %: 59.8 %
Platelets: 195 10*3/uL (ref 140–400)
RBC: 5.11 10*6/uL — ABNORMAL HIGH (ref 3.80–5.10)
RDW: 13 % (ref 11.0–15.0)
Total Lymphocyte: 27.8 %
WBC: 5.7 10*3/uL (ref 3.8–10.8)

## 2019-09-05 MED ORDER — LEVOFLOXACIN 500 MG PO TABS: 500.0000 mg | ORAL_TABLET | Freq: Every day | ORAL | 0 refills | Status: DC

## 2019-09-05 MED ORDER — CEFTRIAXONE SODIUM 1 G IJ SOLR
1.0000 g | Freq: Once | INTRAMUSCULAR | Status: AC
  Administered 2019-09-05: 1 g via INTRAMUSCULAR

## 2019-09-05 NOTE — Telephone Encounter (Signed)
Needs OV.  

## 2019-09-05 NOTE — Telephone Encounter (Signed)
Patient called has been feeling dizzy all week and yesterday her leg started swelling, then she an abrasion and fluid started coming out she would like to come in to be seen.

## 2019-09-05 NOTE — Telephone Encounter (Signed)
Scheduled

## 2019-09-06 NOTE — Patient Instructions (Addendum)
Your tetanus immunization is up-to-date.  Keep wound clean and dry.  Clean with peroxide twice daily and apply Bactroban or Neosporin ointment.  Take Levaquin 500 mg daily for 7 days and call if it is not healing.  1 g IM Rocephin given in the office today.  White blood cell count is normal.

## 2019-09-06 NOTE — Progress Notes (Signed)
   Subjective:    Patient ID: Terri Bridges, female    DOB: 1934-07-10, 85 y.o.   MRN: [id number]  HPI 85 year old Female with history of COPD, anxietym recurrent maxillary sinusitis, anxiety, chronic depression, asthma and allergic rhinitis treated by Dr. Neldon Mc, hypertension, hypothyroidism, osteoporosis.    She saw Dr. Joseph Art in March 2021 in urgent care for cellulitis from leg wound of right lower extremity which took some time to heal.  Last tetanus immunization 2015.  She was treated with Keflex.  Just recently patient noticed open sore mid left lower leg.  Does not recall any bite injury or fall.  Noticed that it was draining clear fluid and she called [place] and we advised office visit.  Leg is erythematous about the wound.  No fever chills nausea or vomiting.  CBC with differential was drawn and white blood cell count today is 5700  Review of Systems see above     Objective:   Physical Exam She is afebrile temperature 98.5 degrees blood pressure 110/70 pulse 92 pulse oximetry 97% weight 150 pounds BMI 31.45.  She is anxious in the office today.  Left leg wound noted mid left lower leg without purulent drainage.  It appears to have sealed over with some surrounding erythema.  It is near mid tibia.       Assessment & Plan:  Left leg wound with surrounding cellulitis  Plan: CBC with differential.  1 g IM Rocephin given in office.  Levaquin 500 mg daily for 7 days.  Keep wound clean and dry and apply Bactroban ointment.  Call if not healing or improving.

## 2019-09-08 ENCOUNTER — Encounter (HOSPITAL_COMMUNITY): Payer: Self-pay

## 2019-09-08 ENCOUNTER — Ambulatory Visit (HOSPITAL_COMMUNITY)
Admission: EM | Admit: 2019-09-08 | Discharge: 2019-09-08 | Disposition: A | Discharge status: Home / Self Care | Payer: Medicare Other | Attending: Physician Assistant | Admitting: Physician Assistant

## 2019-09-08 ENCOUNTER — Other Ambulatory Visit: Payer: Self-pay

## 2019-09-08 DIAGNOSIS — Z23 Encounter for immunization: Secondary | ICD-10-CM

## 2019-09-08 DIAGNOSIS — S51811A Laceration without foreign body of right forearm, initial encounter: Secondary | ICD-10-CM

## 2019-09-08 MED ORDER — TETANUS-DIPHTH-ACELL PERTUSSIS 5-2.5-18.5 LF-MCG/0.5 IM SUSP
INTRAMUSCULAR | Status: AC
  Filled 2019-09-08: qty 0.5

## 2019-09-08 MED ORDER — TETANUS-DIPHTH-ACELL PERTUSSIS 5-2.5-18.5 LF-MCG/0.5 IM SUSP
0.5000 mL | Freq: Once | INTRAMUSCULAR | Status: AC
  Administered 2019-09-08: 0.5 mL via INTRAMUSCULAR

## 2019-09-08 NOTE — ED Provider Notes (Signed)
[facility]    CSN: [id number] Arrival date & time: 09/08/19  1027      History   Chief Complaint Chief Complaint  Patient presents with  . Laceration    HPI Terri Bridges is a 85 y.o. female.   Pt presents with laceration to right forearm after she scraped her arm on the corner of her refrigerator door about 3 hours ago.  She has applied pressure, bleeding controlled.  She is unsure of last tetanus.     Past Medical History:  Diagnosis Date  . Allergic rhinitis   . Allergy    uses Flonase daily as needed  . Anxiety    takes Xanax daily as needed  . Arthritis    "back" (04/21/2013)  . Chronic lower back pain   . Constipation    takes an OTC stool softener  . Depression    takes Zoloft daily  . Dry eyes    uses eye drops  . Exertional shortness of breath   . GERD (gastroesophageal reflux disease)    takes Omeprazole daily and Protonix daily as needed  . H/O hiatal hernia   . Headache(784.0)    sinus  . Heart murmur    years ago  . History of bronchitis    last time many yrs ago  . Hypertension    takes Losartan and Cardizem daily  . Hypothyroidism    takes Synthroid daily  . Joint pain   . Joint swelling   . Nocturia   . Osteoporosis   . Pressure in chest 08/23/13   fell and started having chest pressure, will have ECHO and stress test 09/04/13 before surgery  . Ringing in ears    sees Dr.Byers for this  . Scoliosis   . Urinary frequency   . Urinary incontinence   . Urinary urgency     Patient Active Problem List   Diagnosis Date Noted  . Asthma 03/26/2018  . Left inguinal hernia 08/05/2017  . Osteoarthritis of right knee 08/05/2017  . Chronic pansinusitis 05/31/2016  . Eustachian tube dysfunction, bilateral 05/31/2016  . Sensorineural hearing loss (SNHL), bilateral 05/31/2016  . Carpal tunnel syndrome 05/12/2015  . Degenerative arthritis of finger 05/12/2015  . Primary arthrosis of first carpometacarpal joints, bilateral  05/12/2015  . Chronic kidney disease 05/22/2014  . Left knee pain 09/08/2013  . H/O total knee replacement 08/11/2013  . S/P total knee arthroplasty 04/21/2013  . Gonalgia 04/11/2013  . Osteoporosis, unspecified 03/24/2012  . At high risk for falls 02/12/2012  . Leg pain, bilateral 02/12/2012  . Anxiety 03/06/2011  . COPD (chronic obstructive pulmonary disease) ([place]) 01/08/2011  . Hypertension 01/05/2011  . Hypothyroidism 01/05/2011  . Recurrent sinusitis 01/05/2011  . Allergic rhinitis 01/05/2011  . Depression 01/05/2011  . GE reflux 01/05/2011    Past Surgical History:  Procedure Laterality Date  . CARDIAC CATHETERIZATION  2005  . CATARACT EXTRACTION W/ INTRAOCULAR LENS  IMPLANT, BILATERAL Bilateral   . COLONOSCOPY    . ESOPHAGOGASTRODUODENOSCOPY    . EXCISIONAL TOTAL KNEE ARTHROPLASTY Left 09/08/2013   Procedure: EXCISIONAL TOTAL KNEE ARTHROPLASTY POLYEXCHANGE;  Surgeon: Vickey Huger, MD;  Location: [facility];  Service: Orthopedics;  Laterality: Left;  . EYE SURGERY    . JOINT REPLACEMENT    . KNEE ARTHROSCOPY Right   . NASAL SINUS SURGERY     x 4  . SHOULDER ARTHROSCOPY W/ ROTATOR CUFF REPAIR Right   . TONSILLECTOMY  1940's  . TOTAL KNEE ARTHROPLASTY Left 04/21/2013  .  TOTAL KNEE ARTHROPLASTY Left 04/21/2013   Procedure: TOTAL KNEE ARTHROPLASTY;  Surgeon: Vickey Huger, MD;  Location: [facility];  Service: Orthopedics;  Laterality: Left;  . TUBAL LIGATION      OB History   No obstetric history on file.      Home Medications    Prior to Admission medications   Medication Sig Start Date End Date Taking? Authorizing Provider  albuterol (PROVENTIL HFA;VENTOLIN HFA) 108 (90 BASE) MCG/ACT inhaler Inhale 2 puffs into the lungs every 6 (six) hours as needed for wheezing or shortness of breath. 06/22/14   Elby Showers, MD  albuterol (PROVENTIL) (2.5 MG/3ML) 0.083% nebulizer solution USE 1 VIAL IN NEBULIZER EVERY 6 HOURS AS NEEDED FOR WHEEZING OR SHORTNESS OF BREATH 08/30/19   Elby Showers, MD  ALPRAZolam Duanne Moron) 0.5 MG tablet Take 1 tablet (0.5 mg total) by mouth at bedtime. 02/04/19   Elby Showers, MD  amLODipine (NORVASC) 5 MG tablet Take 1 tablet by mouth once daily 03/31/19   Elby Showers, MD  budesonide-formoterol [facility]) 160-4.5 MCG/ACT inhaler Inhale 2 puffs into the lungs 3 times/day as needed-between meals & bedtime. 03/26/18   Martyn Ehrich, NP  Calcium-Vitamin D (CALTRATE 600 PLUS-VIT D PO) Take 1 tablet by mouth daily.    [provider]  chlorpheniramine-HYDROcodone (TUSSIONEX PENNKINETIC ER) 10-8 MG/5ML SUER Take 5 mLs by mouth every 12 (twelve) hours as needed for cough. 07/17/19   Elby Showers, MD  furosemide (LASIX) 20 MG tablet Take 1 tablet by mouth once daily 07/02/19   Elby Showers, MD  ibandronate (BONIVA) 150 MG tablet Take 1 tablet (150 mg total) by mouth every 30 (thirty) days. Take in the morning with a full glass of water, on an empty stomach, and do not take anything else by mouth or lie down for the next 30 min. 11/04/18   Elby Showers, MD  levofloxacin (LEVAQUIN) 500 MG tablet Take 1 tablet (500 mg total) by mouth daily. 09/05/19   Elby Showers, MD  levothyroxine (SYNTHROID) 100 MCG tablet Take 1 tablet by mouth once daily 03/18/19   Elby Showers, MD  losartan (COZAAR) 100 MG tablet Take 1 tablet by mouth once daily 06/03/19   Elby Showers, MD  mupirocin ointment (BACTROBAN) 2 % Apply 1 application topically 3 (three) times daily. 03/31/19   Robyn Haber, MD  Omega-3 Fatty Acids (FISH OIL PO) Take 1 capsule by mouth daily.    [provider]  omeprazole (PRILOSEC) 20 MG capsule Take 1 capsule by mouth once daily 08/30/19   Elby Showers, MD  PARoxetine (PAXIL) 10 MG tablet Take 1 tablet (10 mg total) by mouth daily. Patient taking differently: Take 10 mg by mouth daily. Not started taking 02/04/19   Elby Showers, MD    Family History Family History  Problem Relation Age of Onset  . Heart failure Mother   .  Heart failure Father   . Hypertension Sister   . Neuropathy Neg Hx     Social History Social History   Tobacco Use  . Smoking status: Former Smoker    Packs/day: 1.00    Years: 10.00    Pack years: 10.00    Types: Cigarettes    Quit date: 03/30/1991    Years since quitting: 28.4  . Smokeless tobacco: Never Used  . Tobacco comment: quit smoking in Mar 1993  Substance Use Topics  . Alcohol use: No    Alcohol/week: 0.0  standard drinks  . Drug use: No     Allergies   Doxycycline   Review of Systems Review of Systems  Constitutional: Negative for activity change and chills.  Cardiovascular: Negative for palpitations.  Musculoskeletal: Negative for arthralgias, joint swelling and myalgias.  Skin: Positive for wound (right forearm). Negative for rash.  Neurological: Negative for weakness, light-headedness and headaches.  Hematological: Does not bruise/bleed easily.  Psychiatric/Behavioral: Negative for confusion.     Physical Exam Triage Vital Signs ED Triage Vitals [09/08/19 2045]  Enc Vitals Group     BP (!) 156/75     Pulse Rate 80     Resp 18     Temp 98.2 F (36.8 C)     Temp Source Oral     SpO2 96 %     Weight      Height      Head Circumference      Peak Flow      Pain Score 0     Pain Loc      Pain Edu?      Excl. in [place]?    No data found.  Updated Vital Signs BP (!) 156/75 (BP Location: Left Arm)   Pulse 80   Temp 98.2 F (36.8 C) (Oral)   Resp 18   SpO2 96%   Visual Acuity Right Eye Distance:   Left Eye Distance:   Bilateral Distance:    Right Eye Near:   Left Eye Near:    Bilateral Near:     Physical Exam Vitals and nursing note reviewed.  Constitutional:      Appearance: Normal appearance.  HENT:     Head: Normocephalic and atraumatic.     Nose: Nose normal.  Eyes:     Conjunctiva/sclera: Conjunctivae normal.  Cardiovascular:     Rate and Rhythm: Normal rate and regular rhythm.     Pulses: Normal pulses.  Pulmonary:      Effort: Pulmonary effort is normal.  Musculoskeletal:        General: Normal range of motion.     Cervical back: Normal range of motion and neck supple.  Skin:    General: Skin is warm.       Neurological:     Mental Status: She is alert. Mental status is at baseline.  Psychiatric:        Mood and Affect: Mood normal.        Behavior: Behavior normal.        Thought Content: Thought content normal.      UC Treatments / Results  Labs (all labs ordered are listed, but only abnormal results are displayed) Labs Reviewed - No data to display  EKG   Radiology No results found.  Procedures Laceration Repair  Date/Time: 09/11/2019 1:55 PM Performed by: Konrad Felix, PA-C Authorized by: Konrad Felix, PA-C   Consent:    Consent obtained:  Verbal   Consent given by:  Patient   Risks discussed:  Infection and poor wound healing   Alternatives discussed:  No treatment Laceration details:    Location:  Shoulder/arm   Shoulder/arm location:  R lower arm   Length (cm):  1.5 Repair type:    Repair type:  Simple Pre-procedure details:    Preparation:  Patient was prepped and draped in usual sterile fashion Exploration:    Wound exploration: entire depth of wound probed and visualized     Contaminated: no   Treatment:    Area cleansed with:  Saline  Amount of cleaning:  Standard   Irrigation solution:  Sterile saline   Irrigation method:  Pressure wash   Visualized foreign bodies/material removed: no   Skin repair:    Repair method:  Steri-Strips   Number of Steri-Strips:  3 Approximation:    Approximation:  Close Post-procedure details:    Dressing:  Non-adherent dressing and adhesive bandage   Patient tolerance of procedure:  Tolerated well, no immediate complications   (including critical care time)  Medications Ordered in UC Medications  Tdap (BOOSTRIX) injection 0.5 mL (has no administration in time range)    Initial Impression / Assessment and Plan  / UC Course  I have reviewed the triage vital signs and the nursing notes.  Pertinent labs & imaging results that were available during my care of the patient were reviewed by me and considered in my medical decision making (see chart for details).     Superficial flap laceration to dorsal aspect of right forearm, skin very thin.  Well approximated with three steri strips, pressure dressing applied.  Tdap given today.  Pt has an upcoming appointment with PCP, will keep this visit to assess healing.  Return precautions discussed including increased redness, swelling, drainage.  Final Clinical Impressions(s) / UC Diagnoses   Final diagnoses:  None   Discharge Instructions   None    ED Prescriptions    None     PDMP not reviewed this encounter.   Konrad Felix, PA-C 09/11/19 1359

## 2019-09-08 NOTE — ED Triage Notes (Signed)
Pt presents with laceration in the right arm. Pt states she was closing her freezer door and slammed her right arm 3 hrs ago approx.

## 2019-09-10 ENCOUNTER — Ambulatory Visit: Payer: Medicare Other | Admitting: Pulmonary Disease

## 2019-09-10 ENCOUNTER — Ambulatory Visit: Payer: Medicare Other | Admitting: Adult Health

## 2019-09-11 ENCOUNTER — Other Ambulatory Visit: Payer: Self-pay | Admitting: Internal Medicine

## 2019-09-16 ENCOUNTER — Other Ambulatory Visit: Payer: Self-pay

## 2019-09-16 ENCOUNTER — Other Ambulatory Visit: Payer: Medicare Other | Admitting: Internal Medicine

## 2019-09-16 DIAGNOSIS — E039 Hypothyroidism, unspecified: Secondary | ICD-10-CM | POA: Diagnosis not present

## 2019-09-17 LAB — TSH: TSH: 2.78 mIU/L (ref 0.40–4.50)

## 2019-09-18 ENCOUNTER — Encounter: Payer: Self-pay | Admitting: Internal Medicine

## 2019-09-18 ENCOUNTER — Ambulatory Visit (INDEPENDENT_AMBULATORY_CARE_PROVIDER_SITE_OTHER): Payer: Medicare Other | Admitting: Internal Medicine

## 2019-09-18 VITALS — BP 150/100 | HR 94

## 2019-09-18 DIAGNOSIS — Z23 Encounter for immunization: Secondary | ICD-10-CM | POA: Diagnosis not present

## 2019-09-18 DIAGNOSIS — F419 Anxiety disorder, unspecified: Secondary | ICD-10-CM

## 2019-09-18 DIAGNOSIS — M1711 Unilateral primary osteoarthritis, right knee: Secondary | ICD-10-CM | POA: Diagnosis not present

## 2019-09-18 DIAGNOSIS — E039 Hypothyroidism, unspecified: Secondary | ICD-10-CM

## 2019-09-18 DIAGNOSIS — F329 Major depressive disorder, single episode, unspecified: Secondary | ICD-10-CM

## 2019-09-18 DIAGNOSIS — F5105 Insomnia due to other mental disorder: Secondary | ICD-10-CM

## 2019-09-18 DIAGNOSIS — F32A Depression, unspecified: Secondary | ICD-10-CM

## 2019-09-18 DIAGNOSIS — M810 Age-related osteoporosis without current pathological fracture: Secondary | ICD-10-CM | POA: Diagnosis not present

## 2019-09-18 DIAGNOSIS — F409 Phobic anxiety disorder, unspecified: Secondary | ICD-10-CM | POA: Diagnosis not present

## 2019-09-18 DIAGNOSIS — Z8709 Personal history of other diseases of the respiratory system: Secondary | ICD-10-CM

## 2019-09-18 DIAGNOSIS — N1831 Chronic kidney disease, stage 3a: Secondary | ICD-10-CM | POA: Diagnosis not present

## 2019-09-18 DIAGNOSIS — I10 Essential (primary) hypertension: Secondary | ICD-10-CM

## 2019-09-18 DIAGNOSIS — S51811D Laceration without foreign body of right forearm, subsequent encounter: Secondary | ICD-10-CM | POA: Diagnosis not present

## 2019-09-18 MED ORDER — ALPRAZOLAM 0.5 MG PO TABS
0.5000 mg | ORAL_TABLET | Freq: Every day | ORAL | 1 refills | Status: DC
Start: 2019-09-18 — End: 2020-06-02

## 2019-10-04 NOTE — Patient Instructions (Signed)
Right forearm is healing well.  Continue current medications.  Flu vaccine given.  Return for health maintenance exam and Medicare wellness visit in 6 months.

## 2019-10-04 NOTE — Progress Notes (Signed)
   Subjective:    Patient ID: Terri Bridges, female    DOB: [birth year], 85 y.o.   MRN: [id number]  HPI 85 year old Female in today for 68-month recheck.  She has a history of hypothyroidism, anxiety, depression, recurrent respiratory and sinus infections, chronic kidney disease, chronic pansinusitis, hypertension, osteoarthritis, hearing loss, GE reflux.  TSH is within normal limits on current dose of thyroid replacement.  Was seen in urgent care with laceration to right forearm after scraping it on the refrigerator door.  1.5 cm laceration repaired with Steri-Strips.  It is healing well.  Flu vaccine given today.  Has had 1 Moderna vaccine for COVID-19.  Pneumococcal vaccines are up-to-date.   Review of Systems history of COPD with intermittent exacerbations     Objective:   Physical Exam Blood pressure 150/100 pulse 94 pulse oximetry 96%  Skin warm and dry.  No cervical adenopathy.  No thyromegaly.  No carotid bruits.  Chest is clear to auscultation.  Cardiac exam regular rate and rhythm normal S1 and S2 without murmurs or gallops.  No lower extremity pitting edema.  Affect is anxious and slightly depressed.  Chronic dysthymia.       Assessment & Plan:  Elevated blood pressure reading but she seems anxious today-continue current treatment amlodipine and Lasix as well as losartan 100 mg daily  GE reflux treated with Prilosec  Depression treated with Paxil 10 mg daily  Anxiety treated with Xanax at bedtime  COPD treated with Symbicort inhaler and albuterol inhaler  Osteoporosis treated with Boniva  Recent laceration to forearm Steri-Stripped in urgent care and received Tdap update 09/08/2019  Plan: Patient will continue current medications.  She will continue to socially distance herself due to COVID-19 pandemic.  She will get second Moderna vaccine when it is available.  She will return here in 6 months for health maintenance exam and Medicare wellness visit.

## 2019-10-11 ENCOUNTER — Other Ambulatory Visit: Payer: Self-pay | Admitting: Internal Medicine

## 2019-10-28 ENCOUNTER — Other Ambulatory Visit: Payer: Self-pay | Admitting: Internal Medicine

## 2019-11-20 ENCOUNTER — Telehealth: Payer: Self-pay

## 2019-11-20 NOTE — Telephone Encounter (Signed)
Patient called [facility] told her that we need to fax a new prescription for her tubing for her nebulizer.  Their phone number is [phone]  fax number [phone]

## 2019-11-20 NOTE — Telephone Encounter (Signed)
Mariann Laster will call [facility] to see exactly what is needed

## 2019-11-21 NOTE — Telephone Encounter (Signed)
Call [facility], they are going to call back with what patient needs.

## 2019-11-24 DIAGNOSIS — Z23 Encounter for immunization: Secondary | ICD-10-CM | POA: Diagnosis not present

## 2019-11-24 NOTE — Telephone Encounter (Signed)
Spoke with lady at [facility] [phone], she placed order for patient on tubing, she stated patient does  not ned ne RX, since machine is not 83 years old. Once it is 85 years old she will need new RX or new machine. When she place order the tubing is on back order and will ship as soon as it comes in.

## 2019-12-01 ENCOUNTER — Other Ambulatory Visit: Payer: Self-pay | Admitting: Internal Medicine

## 2019-12-14 ENCOUNTER — Other Ambulatory Visit: Payer: Self-pay | Admitting: Internal Medicine

## 2019-12-15 ENCOUNTER — Other Ambulatory Visit: Payer: Self-pay | Admitting: Internal Medicine

## 2019-12-15 MED ORDER — IBANDRONATE SODIUM 150 MG PO TABS
150.0000 mg | ORAL_TABLET | ORAL | 11 refills | Status: DC
Start: 2019-12-15 — End: 2021-03-11

## 2019-12-15 NOTE — Telephone Encounter (Signed)
Refill from [facility] on [address] [place]. [place] [address] Ibandronate 150mg  tab Last fill: 10/28/19 Last OV: 09/18/19 Next CPE: 03/18/20

## 2019-12-17 ENCOUNTER — Telehealth: Payer: Self-pay | Admitting: Internal Medicine

## 2019-12-17 NOTE — Telephone Encounter (Signed)
Tried to call again and voice mail was full.

## 2019-12-17 NOTE — Telephone Encounter (Signed)
Left to VM to CB and schedule Car visit

## 2019-12-17 NOTE — Telephone Encounter (Signed)
After talking with Dr Renold Genta she suggested scheduling car visit

## 2019-12-17 NOTE — Telephone Encounter (Signed)
Terri Bridges [phone]  [name] called to say she has been having trouble with breathing for couple of weeks, she has runny nose, some coughing, tired, sounds congested. She has been trying to treat herself at home, just not getting better. Has Covid vaccines- No Covid exposures, No fever.

## 2019-12-18 NOTE — Telephone Encounter (Signed)
Patient called back, said she did not have any missed calls from [place]. I let her know I had left her 3 VM and had also called 2 times unable to leave messages because VM was full. She did schedule appointment

## 2019-12-19 ENCOUNTER — Other Ambulatory Visit: Payer: Self-pay

## 2019-12-19 ENCOUNTER — Ambulatory Visit (INDEPENDENT_AMBULATORY_CARE_PROVIDER_SITE_OTHER): Payer: Medicare Other | Admitting: Internal Medicine

## 2019-12-19 VITALS — HR 86 | Temp 98.6°F

## 2019-12-19 DIAGNOSIS — J040 Acute laryngitis: Secondary | ICD-10-CM

## 2019-12-19 DIAGNOSIS — Z8709 Personal history of other diseases of the respiratory system: Secondary | ICD-10-CM

## 2019-12-19 DIAGNOSIS — R059 Cough, unspecified: Secondary | ICD-10-CM

## 2019-12-19 MED ORDER — HYDROCOD POLST-CPM POLST ER 10-8 MG/5ML PO SUER: 5.0000 mL | Freq: Two times a day (BID) | ORAL | 0 refills | Status: DC | PRN

## 2019-12-19 MED ORDER — LEVOFLOXACIN 500 MG PO TABS: 500.0000 mg | ORAL_TABLET | Freq: Every day | ORAL | 0 refills | Status: DC

## 2019-12-19 MED ORDER — PREDNISONE 10 MG PO TABS: ORAL_TABLET | ORAL | 0 refills | Status: DC

## 2019-12-19 NOTE — Patient Instructions (Addendum)
Stay well-hydrated.  Take Tussionex sparingly as needed for cough.  Take prednisone in tapering course as directed.  Levaquin 500 mg daily with a meal for 10 days.  May use nebulizer if necessary up to 4 times a day.  Call if not improving next week or sooner if worse.

## 2019-12-19 NOTE — Progress Notes (Signed)
   Subjective:    Patient ID: Terri Bridges, female    DOB: 1934-11-07, 85 y.o.   MRN: [id number]  HPI 85 year old Female with 2 weeks history of cough, congestion and laryngitis.  Longstanding history of COPD.  Has been hoarse for 2 weeks.  No fever or shaking chills.  No nausea or vomiting.  No myalgias.  Had Moderna Covid-19 vaccine in February 2021.  Had high-dose flu vaccine September 2021.  Resides alone.  She has a history of depression, hypertension, hypothyroidism, allergic rhinitis, GE reflux, recurrent sinusitis, COPD, stage III chronic kidney disease, osteoarthritis and osteoporosis.  She has home nebulizer that she uses on a as needed basis.  History of asthma and allergic rhinitis treated by Dr. Neldon Mc.  Social history: She is divorced, retired, and resides alone in a mobile home.  She has a history of anxiety.  She no longer smokes.  She smokes 1 to 2 packs of cigarettes daily for 17 years and quit smoking in 1993.  Family history: Father died at age 62 of an MI.  Mother died at age 14 of an MI.  Review of Systems see above-complains of generalized weakness.  Denies chest pain.  Not heard to be coughing during the day appointment.     Objective:   Physical Exam Vital signs reviewed.  She looks a bit pale and is very hoarse.  Not in any acute respiratory distress today.  TMs are full bilaterally.  Neck is supple.  Chest is clear to auscultation without rales or wheezing.  Pulse oximetry is within normal limits on room air.  Skin is warm and dry.  She is able to ambulate.       Assessment & Plan:  Acute laryngitis  Acute upper respiratory infection  History of COPD  Plan: Levaquin 500 mg daily for 10 days.  Tussionex 1 teaspoon p.o. every 12 hours if needed for cough.  Take prednisone in tapering course starting with 6 tablets day 1 and decreasing by 10 mg daily i.e. 6-5-4-3-2-1 taper.  Rest and stay well-hydrated.  COVID-19 test and respiratory virus panel have been  obtained  Addendum: COVID-19 test and respiratory virus panel both are negative.  Patient notified.

## 2019-12-20 LAB — SARS-COV-2 RNA,(COVID-19) QUALITATIVE NAAT: SARS CoV2 RNA: NOT DETECTED

## 2019-12-22 ENCOUNTER — Telehealth: Payer: Self-pay

## 2019-12-22 LAB — RESPIRATORY VIRUS PANEL

## 2019-12-22 NOTE — Telephone Encounter (Signed)
-----   Message from Elby Showers, MD sent at 12/22/2019  2:47 PM EST ----- Please call Terri Bridges. Hope she is feeling better. Did not isolate any of these viruses. I called and left a message about her Covid test yesterday.

## 2019-12-22 NOTE — Telephone Encounter (Signed)
Informed patient of negative results.

## 2019-12-25 ENCOUNTER — Telehealth: Payer: Self-pay | Admitting: Internal Medicine

## 2019-12-25 NOTE — Telephone Encounter (Signed)
Scheduled appointment

## 2019-12-25 NOTE — Telephone Encounter (Signed)
Terri Bridges [phone]  [name] called to say she is still very weak and shaky, still has some cough, and her head has like a roar in it. She also said that [facility] did not have the cough medicine so she did not pick it up.

## 2019-12-25 NOTE — Telephone Encounter (Signed)
Schedule OV for tomorrow

## 2019-12-26 ENCOUNTER — Encounter: Payer: Self-pay | Admitting: Internal Medicine

## 2019-12-26 ENCOUNTER — Other Ambulatory Visit: Payer: Self-pay

## 2019-12-26 ENCOUNTER — Ambulatory Visit (INDEPENDENT_AMBULATORY_CARE_PROVIDER_SITE_OTHER): Payer: Medicare Other | Admitting: Internal Medicine

## 2019-12-26 VITALS — BP 168/90 | HR 84 | Temp 98.2°F | Ht <= 58 in | Wt 152.0 lb

## 2019-12-26 DIAGNOSIS — R42 Dizziness and giddiness: Secondary | ICD-10-CM | POA: Diagnosis not present

## 2019-12-26 DIAGNOSIS — E039 Hypothyroidism, unspecified: Secondary | ICD-10-CM

## 2019-12-26 DIAGNOSIS — R0602 Shortness of breath: Secondary | ICD-10-CM

## 2019-12-26 DIAGNOSIS — Z8709 Personal history of other diseases of the respiratory system: Secondary | ICD-10-CM

## 2019-12-26 DIAGNOSIS — I10 Essential (primary) hypertension: Secondary | ICD-10-CM | POA: Diagnosis not present

## 2019-12-26 DIAGNOSIS — F411 Generalized anxiety disorder: Secondary | ICD-10-CM | POA: Diagnosis not present

## 2019-12-26 DIAGNOSIS — R531 Weakness: Secondary | ICD-10-CM | POA: Diagnosis not present

## 2019-12-26 DIAGNOSIS — R059 Cough, unspecified: Secondary | ICD-10-CM | POA: Diagnosis not present

## 2019-12-26 MED ORDER — PREDNISONE 10 MG PO TABS: ORAL_TABLET | ORAL | 0 refills | Status: DC

## 2019-12-26 MED ORDER — CEFTRIAXONE SODIUM 1 G IJ SOLR
1.0000 g | Freq: Once | INTRAMUSCULAR | Status: AC
  Administered 2019-12-26: 10:00:00 1 g via INTRAMUSCULAR

## 2019-12-26 MED ORDER — LEVOFLOXACIN 500 MG PO TABS: 500.0000 mg | ORAL_TABLET | Freq: Every day | ORAL | 0 refills | Status: DC

## 2019-12-26 NOTE — Patient Instructions (Signed)
Your pulse oximetry is normal. Your blood pressure is a bit elevated perhaps due to anxiety. Your chest is clear. We have given you 1 g IM Rocephin which is an antibiotic. We have refilled Levaquin which is an antibiotic for 10 days. We have also refilled prednisone tapering course which he took previously. Labs are drawn and are pending and we will notify you.

## 2019-12-26 NOTE — Progress Notes (Signed)
   Subjective:    Patient ID: Terri Bridges, female    DOB: March 07, 1934, 85 y.o.   MRN: [id number]  HPI  85 year old  Female seen for SOB and dizziness. She was seen on December 10 with acute laryngitis. She was tested for Covid and result was negative. She called yesterday complaining of weakness and shakiness. Still had some cough and said her head felt like it had a roar in it.  When seen on December 10, she had a 2-week history of cough. Was diagnosed with laryngitis and acute upper respiratory infection along with COPD. Was treated with a 10-day course of Levaquin and prednisone in a tapering course over 6 days. Respiratory virus panel was also negative in addition to COVID-19 test. Hycodan was prescribed for her but it was not available at the pharmacy so she did not get it.    Review of Systems history of hypothyroidism. TSH checked along with CBC and c-Met.     Objective:   Physical Exam  Blood sugar 168/90, pulse 84, temperature 98.2 degrees pulse oximetry 97% weight 152 pounds. BMI 31.77.  Skin warm and dry. She looks a bit frail and is anxious. Neck is supple. No adenopathy. Chest is clear to auscultation without rales or wheezing whatsoever. Cardiac exam regular rate and rhythm normal S1 and S2 without gallops or ectopy. No significant lower extremity edema although she points to her right leg is an area of concern.        Assessment & Plan:  Anxiety state due to SOB and dizziness. She drove to the office today. Her pulse oximetry is within normal limits. There is no wheezing.  Dyspnea and shortness of breath-history of COPD. Have refilled Levaquin and prednisone. Have also check TSH CBC and c-Met today. I did consider getting a chest x-ray but since she had a good pulse oximetry and her lungs were clear on exam I decided not to pursue an x-ray today. Given 1 g IM Rocephin in the office today.  She has a history of financial difficulties. It is possible she has not been  getting her inhalers. We will inquire about that.  History of dependent edema. At last visit she was placed on Lasix 20 mg daily for edema.  Hypothyroidism-she is supposed to be taking levothyroxine 100 mcg daily and TSH was drawn today.  Essential hypertension she is on losartan 100 mg daily and amlodipine 5 mg daily.  Anxiety and depression treated with Paxil 10 mg daily and Xanax 0.5 mg at bedtime.

## 2019-12-27 LAB — COMPLETE METABOLIC PANEL WITH GFR
AG Ratio: 1.7 (calc) (ref 1.0–2.5)
ALT: 7 U/L (ref 6–29)
AST: 12 U/L (ref 10–35)
Albumin: 3.8 g/dL (ref 3.6–5.1)
Alkaline phosphatase (APISO): 56 U/L (ref 37–153)
BUN/Creatinine Ratio: 17 (calc) (ref 6–22)
BUN: 24 mg/dL (ref 7–25)
CO2: 31 mmol/L (ref 20–32)
Calcium: 9.2 mg/dL (ref 8.6–10.4)
Chloride: 102 mmol/L (ref 98–110)
Creat: 1.45 mg/dL — ABNORMAL HIGH (ref 0.60–0.88)
GFR, Est African American: 39 mL/min/{1.73_m2} — ABNORMAL LOW (ref >=60)
GFR, Est Non African American: 33 mL/min/{1.73_m2} — ABNORMAL LOW (ref >=60)
Globulin: 2.3 g/dL (calc) (ref 1.9–3.7)
Glucose, Bld: 79 mg/dL (ref 65–99)
Potassium: 4.8 mmol/L (ref 3.5–5.3)
Sodium: 140 mmol/L (ref 135–146)
Total Bilirubin: 0.6 mg/dL (ref 0.2–1.2)
Total Protein: 6.1 g/dL (ref 6.1–8.1)

## 2019-12-27 LAB — CBC WITH DIFFERENTIAL/PLATELET
Absolute Monocytes: 588 cells/uL (ref 200–950)
Basophils Absolute: 21 cells/uL (ref 0–200)
Basophils Relative: 0.3 %
Eosinophils Absolute: 98 cells/uL (ref 15–500)
Eosinophils Relative: 1.4 %
HCT: 42.8 % (ref 35.0–45.0)
Hemoglobin: 14.1 g/dL (ref 11.7–15.5)
Lymphs Abs: 2422 cells/uL (ref 850–3900)
MCH: 27.5 pg (ref 27.0–33.0)
MCHC: 32.9 g/dL (ref 32.0–36.0)
MCV: 83.4 fL (ref 80.0–100.0)
MPV: 10.3 fL (ref 7.5–12.5)
Monocytes Relative: 8.4 %
Neutro Abs: 3871 cells/uL (ref 1500–7800)
Neutrophils Relative %: 55.3 %
Platelets: 194 10*3/uL (ref 140–400)
RBC: 5.13 10*6/uL — ABNORMAL HIGH (ref 3.80–5.10)
RDW: 13.3 % (ref 11.0–15.0)
Total Lymphocyte: 34.6 %
WBC: 7 10*3/uL (ref 3.8–10.8)

## 2019-12-27 LAB — TSH: TSH: 3.44 mIU/L (ref 0.40–4.50)

## 2020-01-01 ENCOUNTER — Telehealth: Payer: Self-pay | Admitting: Internal Medicine

## 2020-01-01 NOTE — Telephone Encounter (Signed)
Left detailed message.   

## 2020-01-01 NOTE — Telephone Encounter (Signed)
Terri Bridges called to see what the results of her labs were from last Friday. She stated she is still weak, but feeling some better.

## 2020-01-07 ENCOUNTER — Ambulatory Visit: Payer: Medicare Other | Admitting: Pulmonary Disease

## 2020-01-15 DIAGNOSIS — D485 Neoplasm of uncertain behavior of skin: Secondary | ICD-10-CM | POA: Diagnosis not present

## 2020-01-26 ENCOUNTER — Ambulatory Visit: Payer: Medicare Other | Admitting: Pulmonary Disease

## 2020-01-28 ENCOUNTER — Encounter: Payer: Self-pay | Admitting: Internal Medicine

## 2020-01-30 ENCOUNTER — Ambulatory Visit: Payer: Medicare Other | Admitting: Pulmonary Disease

## 2020-01-30 ENCOUNTER — Telehealth: Payer: Self-pay | Admitting: Pulmonary Disease

## 2020-02-05 NOTE — Telephone Encounter (Signed)
Nothing noted in message. Will close as encounter was created in error.

## 2020-03-01 DIAGNOSIS — D0461 Carcinoma in situ of skin of right upper limb, including shoulder: Secondary | ICD-10-CM | POA: Diagnosis not present

## 2020-03-16 ENCOUNTER — Other Ambulatory Visit: Payer: Medicare Other | Admitting: Internal Medicine

## 2020-03-16 DIAGNOSIS — F419 Anxiety disorder, unspecified: Secondary | ICD-10-CM

## 2020-03-16 DIAGNOSIS — E039 Hypothyroidism, unspecified: Secondary | ICD-10-CM

## 2020-03-16 DIAGNOSIS — G8929 Other chronic pain: Secondary | ICD-10-CM

## 2020-03-16 DIAGNOSIS — N1831 Chronic kidney disease, stage 3a: Secondary | ICD-10-CM

## 2020-03-16 DIAGNOSIS — M810 Age-related osteoporosis without current pathological fracture: Secondary | ICD-10-CM

## 2020-03-16 DIAGNOSIS — M1711 Unilateral primary osteoarthritis, right knee: Secondary | ICD-10-CM

## 2020-03-16 DIAGNOSIS — Z0000 Encounter for general adult medical examination without abnormal findings: Secondary | ICD-10-CM

## 2020-03-16 DIAGNOSIS — E785 Hyperlipidemia, unspecified: Secondary | ICD-10-CM

## 2020-03-16 DIAGNOSIS — I10 Essential (primary) hypertension: Secondary | ICD-10-CM

## 2020-03-18 ENCOUNTER — Ambulatory Visit (INDEPENDENT_AMBULATORY_CARE_PROVIDER_SITE_OTHER): Payer: Medicare Other | Admitting: Internal Medicine

## 2020-03-18 ENCOUNTER — Encounter: Payer: Self-pay | Admitting: Internal Medicine

## 2020-03-18 ENCOUNTER — Other Ambulatory Visit: Payer: Self-pay

## 2020-03-18 VITALS — BP 120/80 | HR 87 | Ht <= 58 in | Wt 150.0 lb

## 2020-03-18 DIAGNOSIS — N1831 Chronic kidney disease, stage 3a: Secondary | ICD-10-CM | POA: Diagnosis not present

## 2020-03-18 DIAGNOSIS — Z0000 Encounter for general adult medical examination without abnormal findings: Secondary | ICD-10-CM

## 2020-03-18 DIAGNOSIS — Z8709 Personal history of other diseases of the respiratory system: Secondary | ICD-10-CM

## 2020-03-18 DIAGNOSIS — M810 Age-related osteoporosis without current pathological fracture: Secondary | ICD-10-CM

## 2020-03-18 DIAGNOSIS — F32A Depression, unspecified: Secondary | ICD-10-CM

## 2020-03-18 DIAGNOSIS — K4090 Unilateral inguinal hernia, without obstruction or gangrene, not specified as recurrent: Secondary | ICD-10-CM

## 2020-03-18 DIAGNOSIS — I10 Essential (primary) hypertension: Secondary | ICD-10-CM | POA: Diagnosis not present

## 2020-03-18 DIAGNOSIS — F5105 Insomnia due to other mental disorder: Secondary | ICD-10-CM

## 2020-03-18 DIAGNOSIS — I8393 Asymptomatic varicose veins of bilateral lower extremities: Secondary | ICD-10-CM | POA: Diagnosis not present

## 2020-03-18 DIAGNOSIS — F419 Anxiety disorder, unspecified: Secondary | ICD-10-CM | POA: Diagnosis not present

## 2020-03-18 DIAGNOSIS — F409 Phobic anxiety disorder, unspecified: Secondary | ICD-10-CM

## 2020-03-18 DIAGNOSIS — G8929 Other chronic pain: Secondary | ICD-10-CM | POA: Diagnosis not present

## 2020-03-18 DIAGNOSIS — R7989 Other specified abnormal findings of blood chemistry: Secondary | ICD-10-CM

## 2020-03-18 DIAGNOSIS — E039 Hypothyroidism, unspecified: Secondary | ICD-10-CM | POA: Diagnosis not present

## 2020-03-18 DIAGNOSIS — M545 Low back pain, unspecified: Secondary | ICD-10-CM | POA: Diagnosis not present

## 2020-03-18 DIAGNOSIS — M1711 Unilateral primary osteoarthritis, right knee: Secondary | ICD-10-CM | POA: Diagnosis not present

## 2020-03-18 DIAGNOSIS — E785 Hyperlipidemia, unspecified: Secondary | ICD-10-CM | POA: Diagnosis not present

## 2020-03-18 LAB — POCT URINALYSIS DIPSTICK
Appearance: NEGATIVE
Bilirubin, UA: NEGATIVE
Blood, UA: NEGATIVE
Glucose, UA: NEGATIVE
Ketones, UA: NEGATIVE
Leukocytes, UA: NEGATIVE
Nitrite, UA: NEGATIVE
Odor: NEGATIVE
Protein, UA: NEGATIVE
Spec Grav, UA: 1.015 (ref 1.010–1.025)
Urobilinogen, UA: 0.2 E.U./dL
pH, UA: 6 (ref 5.0–8.0)

## 2020-03-18 NOTE — Progress Notes (Signed)
Subjective:    Patient ID: Terri Bridges, female    DOB: 17-Apr-1934, 86 y.o.   MRN: [id number]  HPI  86 year old Female for health maintenance exam and evaluation of medical issues. 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 Labs drawn and pending.  Immunizations up to date. Had Shingles vaccine at [facility] several years ago.  No new complaints.   She has a history of chronic depression.  History of hypertension, hypothyroidism, allergic rhinitis, GE reflux, recurrent sinusitis, COPD and osteoporosis.  In the spring 2015, she had total left knee arthroplasty by Dr. Ronnie Derby and 4 months later had revision of the surgery due to instability.  She was scheduled to have right knee arthroplasty but has postponed it.  In 1991 she had sinus surgery by Dr. Lorella Nimrod.  Please see dictation January 21, 2015.  Subsequently suffered a periorbital abscess which was drained by a right intranasal ethmoidectomy in March 1993.  Also had ethmoidectomy, left sphenoidectomy, transnasal frontal sinusotomy March 1993.  An arthroscopic surgery was done on her right knee in 2004.  Bilateral grommet tubes placed February 2004.  Left cataract extraction 2013.  Right rotator cuff repair 2011.  In 2008, she had colonoscopy done by Dr. Oletta Lamas and upper GI endoscopy which showed hiatal hernia.  History of asthma and allergic rhinitis treated by Dr. Neldon Mc.  Had an exercise tolerance test to evaluate chest pain with Dr. Doreatha Lew November 1998.  Study was negative.  Dr. Verl Blalock saw her in 2005 for palpitations and placed her on Cardizem with improvement in blood pressure and palpitations.  She had cardiac cath at that time showing no significant coronary disease.    She has had difficulty affording some of her medications and we have given her many samples throughout the years.  History of osteoporosis with T score in the left femoral neck -2.5 and T score in the lumbar spine being -2.5 in February 2013.  Because of GE reflux,  it was not likely that she would tolerate Fosamax.  She took Actonel for a while in 2002 but discontinued it.  She is a former heavy smoker having smoked 1 to 2 packs of cigarettes daily for 17 years.  She quit smoking in 1993.  She suffered a superficial laceration right anterior lower leg in 2021.  Laceration was treated with Steri-Strips with no evidence of secondary infection.  A tetanus immunization update was given in August 2021.  She has had 3 maternal vaccines.  Had flu vaccine in September 2021.  Has had both Prevnar 13 and pneumococcal 23 vaccines.  Family history: Father died at age 67 of an MI.  Mother died at age 66 of an MI.  Social history: She is divorced.  Resides alone.  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  Prior to that she worked at [employer]  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  In December 2014, her son suffered an arrest in the backyard and subsequently expired presumably of an MI.  He had prior history of drug abuse.  Her granddaughter who has a leg deformity and has had multiple surgeries at [facility] in [place] has moved from the patient's home to the [place] area with her boyfriend and now has a Sport and exercise psychologist.      Review of Systems Right arm skin cancer removed on Feb 21st. We do not have report in in Epic yet.  Chronic dysthymia  Objective:   Physical Exam Blood pressure 120/80, pulse 87, pulse oximetry 96% weight 150 pounds height 4 feet 10 inches BMI 31.45  Skin: Warm and dry.  Nodes none.  TMs are clear.  Neck is supple without JVD thyromegaly or carotid bruits.  Chest is clear to auscultation.  Breasts without masses.  Cardiac exam regular rate and rhythm.  Abdomen is soft nondistended without hepatosplenomegaly masses or tenderness.  No lower extremity edema.  Neuro exam is intact without focal deficits.       Assessment & Plan:  History of skin cancer right  arm removed recently-do not have path report  Anxiety depression-stable  History of COPD-stable  History of recurrent sinusitis  Essential hypertension-stable on current regimen  Hypothyroidism-TSH is normal on thyroid replacement medication  Status post total knee arthroplasty  Hearing loss  GE reflux treated with PPI  Mild chronic kidney disease-her creatinine is elevated at 1.4.  It will be repeated in April when she is well-hydrated.  It was much better last year at 1.02.  History of left inguinal hernia-asymptomatic  Plan: Continue current medications and return in 1 year or as needed.  Repeat creatinine in April.  Subjective:   Patient presents for Medicare Annual/Subsequent preventive examination.  Review Past Medical/Family/Social: See above   Risk Factors  Current exercise habits: Sedentary Dietary issues discussed: Low-fat low carbohydrate  Cardiac risk factors: Family history  Depression Screen  (Note: if answer to either of the following is "Yes", a more complete depression screening is indicated)   Over the past two weeks, have you felt down, depressed or hopeless? No  Over the past two weeks, have you felt little interest or pleasure in doing things? No Have you lost interest or pleasure in daily life? No Do you often feel hopeless? No Do you cry easily over simple problems? No   Activities of Daily Living  In your present state of health, do you have any difficulty performing the following activities?:   Driving? No  Managing money? No  Feeding yourself? No  Getting from bed to chair? No  Climbing a flight of stairs? No  Preparing food and eating?: No  Bathing or showering? No  Getting dressed: No  Getting to the toilet? No  Using the toilet:No  Moving around from place to place: No  In the past year have you fallen or had a near fall?:No  Are you sexually active? No  Do you have more than one partner? No   Hearing Difficulties: No  Do  you often ask people to speak up or repeat themselves?  Yes sometimes Do you experience ringing or noises in your ears?  Yes Do you have difficulty understanding soft or whispered voices? No  Do you feel that you have a problem with memory? No Do you often misplace items? No    Home Safety:  Do you have a smoke alarm at your residence? Yes Do you have grab bars in the bathroom?  Yes Do you have throw rugs in your house?  No   Cognitive Testing  Alert? Yes Normal Appearance?Yes  Oriented to person? Yes Place? Yes  Time? Yes  Recall of three objects? Yes  Can perform simple calculations? Yes  Displays appropriate judgment?Yes  Can read the correct time from a watch face?Yes   List the Names of Other Physician/Practitioners you currently use:  See referral list for the physicians patient is currently seeing.     Review of Systems: See above  Objective:     General appearance: Appears stated age and mildly obese  Head: Normocephalic, without obvious abnormality, atraumatic  Eyes: conj clear, EOMi PEERLA  Ears: normal TM's and external ear canals both ears  Nose: Nares normal. Septum midline. Mucosa normal. No drainage or sinus tenderness.  Throat: lips, mucosa, and tongue normal; teeth and gums normal  Neck: no adenopathy, no carotid bruit, no JVD, supple, symmetrical, trachea midline and thyroid not enlarged, symmetric, no tenderness/mass/nodules  No CVA tenderness.  Lungs: clear to auscultation bilaterally  Breasts: normal appearance, no masses or tenderness Heart: regular rate and rhythm, S1, S2 normal, no murmur, click, rub or gallop  Abdomen: soft, non-tender; bowel sounds normal; no masses, no organomegaly  Musculoskeletal: ROM normal in all joints, no crepitus, no deformity, Normal muscle strengthen. Back  is symmetric, no curvature. Skin: Skin color, texture, turgor normal. No rashes or lesions  Lymph nodes: Cervical, supraclavicular, and axillary nodes normal.   Neurologic: CN 2 -12 Normal, Normal symmetric reflexes. Normal coordination and gait  Psych: Alert & Oriented x 3, Mood appear stable.    Assessment:    Annual wellness medicare exam   Plan:    During the course of the visit the patient was educated and counseled about appropriate screening and preventive services including:   Immunizations are up-to-date  No recent mammogram-patient reminded-last 1 on file was 2019 at [facility]     Patient Instructions (the written plan) was given to the patient.  Medicare Attestation  I have personally reviewed:  The patient's medical and social history  Their use of alcohol, tobacco or illicit drugs  Their current medications and supplements  The patient's functional ability including ADLs,fall risks, home safety risks, cognitive, and hearing and visual impairment  Diet and physical activities  Evidence for depression or mood disorders  The patient's weight, height, BMI, and visual acuity have been recorded in the chart. I have made referrals, counseling, and provided education to the patient based on review of the above and I have provided the patient with a written personalized care plan for preventive services.

## 2020-03-19 LAB — CBC WITH DIFFERENTIAL/PLATELET
Absolute Monocytes: 433 cells/uL (ref 200–950)
Basophils Absolute: 29 cells/uL (ref 0–200)
Basophils Relative: 0.5 %
Eosinophils Absolute: 274 cells/uL (ref 15–500)
Eosinophils Relative: 4.8 %
HCT: 42.6 % (ref 35.0–45.0)
Hemoglobin: 14.1 g/dL (ref 11.7–15.5)
Lymphs Abs: 1613 cells/uL (ref 850–3900)
MCH: 27.2 pg (ref 27.0–33.0)
MCHC: 33.1 g/dL (ref 32.0–36.0)
MCV: 82.2 fL (ref 80.0–100.0)
MPV: 10.4 fL (ref 7.5–12.5)
Monocytes Relative: 7.6 %
Neutro Abs: 3352 cells/uL (ref 1500–7800)
Neutrophils Relative %: 58.8 %
Platelets: 197 10*3/uL (ref 140–400)
RBC: 5.18 10*6/uL — ABNORMAL HIGH (ref 3.80–5.10)
RDW: 12.9 % (ref 11.0–15.0)
Total Lymphocyte: 28.3 %
WBC: 5.7 10*3/uL (ref 3.8–10.8)

## 2020-03-19 LAB — COMPLETE METABOLIC PANEL WITH GFR
AG Ratio: 1.8 (calc) (ref 1.0–2.5)
ALT: 10 U/L (ref 6–29)
AST: 16 U/L (ref 10–35)
Albumin: 4.2 g/dL (ref 3.6–5.1)
Alkaline phosphatase (APISO): 61 U/L (ref 37–153)
BUN/Creatinine Ratio: 14 (calc) (ref 6–22)
BUN: 20 mg/dL (ref 7–25)
CO2: 31 mmol/L (ref 20–32)
Calcium: 9.6 mg/dL (ref 8.6–10.4)
Chloride: 104 mmol/L (ref 98–110)
Creat: 1.41 mg/dL — ABNORMAL HIGH (ref 0.60–0.88)
GFR, Est African American: 40 mL/min/{1.73_m2} — ABNORMAL LOW (ref >=60)
GFR, Est Non African American: 34 mL/min/{1.73_m2} — ABNORMAL LOW (ref >=60)
Globulin: 2.3 g/dL (calc) (ref 1.9–3.7)
Glucose, Bld: 79 mg/dL (ref 65–99)
Potassium: 5.2 mmol/L (ref 3.5–5.3)
Sodium: 143 mmol/L (ref 135–146)
Total Bilirubin: 0.7 mg/dL (ref 0.2–1.2)
Total Protein: 6.5 g/dL (ref 6.1–8.1)

## 2020-03-19 LAB — LIPID PANEL
Cholesterol: 185 mg/dL (ref <200)
HDL: 71 mg/dL (ref >=50)
LDL Cholesterol (Calc): 98 mg/dL (calc)
Non-HDL Cholesterol (Calc): 114 mg/dL (calc) (ref <130)
Total CHOL/HDL Ratio: 2.6 (calc) (ref <5.0)
Triglycerides: 71 mg/dL (ref <150)

## 2020-03-19 LAB — TSH: TSH: 0.65 mIU/L (ref 0.40–4.50)

## 2020-03-19 NOTE — Progress Notes (Signed)
Called and spoke with patient she verbalized understanding. Scheduled appointments

## 2020-03-23 ENCOUNTER — Other Ambulatory Visit: Payer: Self-pay | Admitting: Internal Medicine

## 2020-03-23 MED ORDER — FUROSEMIDE 20 MG PO TABS: 20.0000 mg | ORAL_TABLET | Freq: Every day | ORAL | 3 refills | Status: DC

## 2020-03-23 NOTE — Telephone Encounter (Signed)
Received Fax RX request from [facility] for this medication  [facility] ([place]), [place] - [address] Phone:  [phone]  Fax:  [phone]        Medication -  furosemide (LASIX) 20 MG tablet   Last Refill -   Last OV - 03/18/2020  Last CPE - 03/18/2020  Next Appointment - 04/19/2020

## 2020-03-23 NOTE — Telephone Encounter (Signed)
Refill x one year °

## 2020-04-03 NOTE — Patient Instructions (Addendum)
It was a pleasure to see you today.  Please have mammogram.  Immunizations are up-to-date.  Continue current medications and follow-up in 1 year or as needed.  Return when well-hydrated in April to have repeat serum creatinine.

## 2020-04-09 ENCOUNTER — Other Ambulatory Visit: Payer: Self-pay | Admitting: Internal Medicine

## 2020-04-15 ENCOUNTER — Other Ambulatory Visit: Payer: Self-pay

## 2020-04-15 ENCOUNTER — Other Ambulatory Visit: Payer: Medicare Other | Admitting: Internal Medicine

## 2020-04-15 DIAGNOSIS — I10 Essential (primary) hypertension: Secondary | ICD-10-CM | POA: Diagnosis not present

## 2020-04-16 LAB — BASIC METABOLIC PANEL
BUN/Creatinine Ratio: 13 (calc) (ref 6–22)
BUN: 16 mg/dL (ref 7–25)
CO2: 26 mmol/L (ref 20–32)
Calcium: 8.9 mg/dL (ref 8.6–10.4)
Chloride: 105 mmol/L (ref 98–110)
Creat: 1.19 mg/dL — ABNORMAL HIGH (ref 0.60–0.88)
Glucose, Bld: 99 mg/dL (ref 65–99)
Potassium: 4.2 mmol/L (ref 3.5–5.3)
Sodium: 140 mmol/L (ref 135–146)

## 2020-04-19 ENCOUNTER — Ambulatory Visit (INDEPENDENT_AMBULATORY_CARE_PROVIDER_SITE_OTHER): Payer: Medicare Other | Admitting: Internal Medicine

## 2020-04-19 ENCOUNTER — Other Ambulatory Visit: Payer: Self-pay

## 2020-04-19 ENCOUNTER — Encounter: Payer: Self-pay | Admitting: Internal Medicine

## 2020-04-19 VITALS — BP 130/80 | HR 65 | Ht <= 58 in | Wt 152.0 lb

## 2020-04-19 DIAGNOSIS — Z8709 Personal history of other diseases of the respiratory system: Secondary | ICD-10-CM

## 2020-04-19 DIAGNOSIS — I10 Essential (primary) hypertension: Secondary | ICD-10-CM | POA: Diagnosis not present

## 2020-04-19 DIAGNOSIS — R7989 Other specified abnormal findings of blood chemistry: Secondary | ICD-10-CM

## 2020-04-19 DIAGNOSIS — N1831 Chronic kidney disease, stage 3a: Secondary | ICD-10-CM | POA: Diagnosis not present

## 2020-04-19 NOTE — Patient Instructions (Signed)
It was a pleasure to see you today.  Your creatinine has improved with hydration.  Continue current medications and follow-up here in September 2022 for 71-month recheck appointment.  No change in medication regimen at this time.  Stay well-hydrated.

## 2020-04-19 NOTE — Progress Notes (Signed)
   Subjective:    Patient ID: Terri Bridges, female    DOB: [birth year], 86 y.o.   MRN: [id number]  HPI Here to follow  up on elevated serum creatinine. She was just seen for Medicare wellness and health maintenance exam in March.  Creatinine was elevated at 1.41 and had been 1.02 in March 2021 and 0.98 in January 2020.  In January 2018, her creatinine was elevated at 1.28 but was felt to be volume depleted with acute bronchitis in a fasting state.  At her age she likely does have some element of chronic kidney disease but he came in well-hydrated on April 4 for basic metabolic panel and creatinine had improved to 1.19 from 1.41.  She has a longstanding history of hypertension treated with amlodipine losartan and Lasix.  This is the likely etiology of her chronic kidney disease.  All in all she is doing okay right now and has no new complaints.  History of COPD.  History of hypothyroidism.  She wanted to discuss her DPR.  She has someone different from her granddaughter that she would like to put on her DPR for endocrine office will assist with that today.  Granddaughter is now living out of town.    Review of Systems no new complaints     Objective:   Physical Exam  Blood pressure 130/80 pulse 65 pulse oximetry 98% weight 152 pounds height 4 feet 10 inches  Skin: Warm and dry.  Chest clear to auscultation.  Cardiac exam regular rate and rhythm.  No lower extremity pitting edema.      Assessment & Plan:  Likely has chronic kidney disease stage IIIa.  Patient to stay well-hydrated at all times.  Essential hypertension-stable on current regimen  History of COPD-stable on current regimen  Plan: She has 89-month follow-up visit in September and we will follow-up with creatinine at that time.  Continue current medications.

## 2020-05-02 ENCOUNTER — Other Ambulatory Visit: Payer: Self-pay

## 2020-05-02 ENCOUNTER — Ambulatory Visit (HOSPITAL_COMMUNITY)
Admission: EM | Admit: 2020-05-02 | Discharge: 2020-05-02 | Disposition: A | Discharge status: Home / Self Care | Payer: Medicare Other | Attending: Family Medicine | Admitting: Family Medicine

## 2020-05-02 ENCOUNTER — Encounter (HOSPITAL_COMMUNITY): Payer: Self-pay

## 2020-05-02 DIAGNOSIS — S61411A Laceration without foreign body of right hand, initial encounter: Secondary | ICD-10-CM | POA: Diagnosis not present

## 2020-05-02 NOTE — ED Provider Notes (Signed)
[facility]    CSN: [id number] Arrival date & time: 05/02/20  1305      History   Chief Complaint Chief Complaint  Patient presents with  . skin tear    HPI Terri Bridges is a 86 y.o. female.   Patient presenting today with laceration to dorsal surface of right hand that occurred this afternoon around noon.  She states that her dog jumped up into her lap and his nail caught her skin and tore it.  Has been applying pressure with fairly good hemostasis since the incident.  Pain is about a 7 out of 10 at this time.  Denies numbness, tingling, other areas of injury, decreased range of motion to the hand.  Up-to-date on tetanus per chart review, last dose given 09/08/2019.  Has irrigated the area with a wound cleaner that she had at home but otherwise not trying anything over-the-counter so far.     Past Medical History:  Diagnosis Date  . Allergic rhinitis   . Allergy    uses Flonase daily as needed  . Anxiety    takes Xanax daily as needed  . Arthritis    "back" (04/21/2013)  . Chronic lower back pain   . Constipation    takes an OTC stool softener  . Depression    takes Zoloft daily  . Dry eyes    uses eye drops  . Exertional shortness of breath   . GERD (gastroesophageal reflux disease)    takes Omeprazole daily and Protonix daily as needed  . H/O hiatal hernia   . Headache(784.0)    sinus  . Heart murmur    years ago  . History of bronchitis    last time many yrs ago  . Hypertension    takes Losartan and Cardizem daily  . Hypothyroidism    takes Synthroid daily  . Joint pain   . Joint swelling   . Nocturia   . Osteoporosis   . Pressure in chest 08/23/13   fell and started having chest pressure, will have ECHO and stress test 09/04/13 before surgery  . Ringing in ears    sees Dr.Byers for this  . Scoliosis   . Urinary frequency   . Urinary incontinence   . Urinary urgency     Patient Active Problem List   Diagnosis Date Noted  . Asthma  03/26/2018  . Left inguinal hernia 08/05/2017  . Osteoarthritis of right knee 08/05/2017  . Chronic pansinusitis 05/31/2016  . Eustachian tube dysfunction, bilateral 05/31/2016  . Sensorineural hearing loss (SNHL), bilateral 05/31/2016  . Carpal tunnel syndrome 05/12/2015  . Degenerative arthritis of finger 05/12/2015  . Primary arthrosis of first carpometacarpal joints, bilateral 05/12/2015  . Chronic kidney disease 05/22/2014  . Left knee pain 09/08/2013  . H/O total knee replacement 08/11/2013  . S/P total knee arthroplasty 04/21/2013  . Gonalgia 04/11/2013  . Osteoporosis, unspecified 03/24/2012  . At high risk for falls 02/12/2012  . Leg pain, bilateral 02/12/2012  . Anxiety 03/06/2011  . COPD (chronic obstructive pulmonary disease) ([place]) 01/08/2011  . Hypertension 01/05/2011  . Hypothyroidism 01/05/2011  . Recurrent sinusitis 01/05/2011  . Allergic rhinitis 01/05/2011  . Depression 01/05/2011  . GE reflux 01/05/2011    Past Surgical History:  Procedure Laterality Date  . CARDIAC CATHETERIZATION  2005  . CATARACT EXTRACTION W/ INTRAOCULAR LENS  IMPLANT, BILATERAL Bilateral   . COLONOSCOPY    . ESOPHAGOGASTRODUODENOSCOPY    . EXCISIONAL TOTAL KNEE ARTHROPLASTY Left 09/08/2013  Procedure: EXCISIONAL TOTAL KNEE ARTHROPLASTY POLYEXCHANGE;  Surgeon: Vickey Huger, MD;  Location: [facility];  Service: Orthopedics;  Laterality: Left;  . EYE SURGERY    . JOINT REPLACEMENT    . KNEE ARTHROSCOPY Right   . NASAL SINUS SURGERY     x 4  . SHOULDER ARTHROSCOPY W/ ROTATOR CUFF REPAIR Right   . TONSILLECTOMY  1940's  . TOTAL KNEE ARTHROPLASTY Left 04/21/2013  . TOTAL KNEE ARTHROPLASTY Left 04/21/2013   Procedure: TOTAL KNEE ARTHROPLASTY;  Surgeon: Vickey Huger, MD;  Location: [facility];  Service: Orthopedics;  Laterality: Left;  . TUBAL LIGATION      OB History   No obstetric history on file.      Home Medications    Prior to Admission medications   Medication Sig Start Date End Date  Taking? Authorizing Provider  albuterol (PROVENTIL HFA;VENTOLIN HFA) 108 (90 BASE) MCG/ACT inhaler Inhale 2 puffs into the lungs every 6 (six) hours as needed for wheezing or shortness of breath. 06/22/14   Elby Showers, MD  albuterol (PROVENTIL) (2.5 MG/3ML) 0.083% nebulizer solution USE 1 VIAL IN NEBULIZER EVERY 6 HOURS AS NEEDED FOR WHEEZING OR SHORTNESS OF BREATH 10/28/19   Elby Showers, MD  ALPRAZolam Duanne Moron) 0.5 MG tablet Take 1 tablet (0.5 mg total) by mouth at bedtime. 09/18/19   Elby Showers, MD  amLODipine (NORVASC) 5 MG tablet Take 1 tablet by mouth once daily 04/09/20   Elby Showers, MD  budesonide-formoterol [facility]) 160-4.5 MCG/ACT inhaler Inhale 2 puffs into the lungs 3 times/day as needed-between meals & bedtime. 03/26/18   Martyn Ehrich, NP  Calcium-Vitamin D (CALTRATE 600 PLUS-VIT D PO) Take 1 tablet by mouth daily.    [provider]  furosemide (LASIX) 20 MG tablet Take 1 tablet (20 mg total) by mouth daily. 03/23/20   Elby Showers, MD  ibandronate (BONIVA) 150 MG tablet Take 1 tablet (150 mg total) by mouth every 30 (thirty) days. Take in the morning with a full glass of water, on an empty stomach, and do not take anything else by mouth or lie down for the next 30 min. 12/15/19   Elby Showers, MD  levothyroxine (SYNTHROID) 100 MCG tablet Take 1 tablet by mouth once daily 12/15/19   Elby Showers, MD  losartan (COZAAR) 100 MG tablet Take 1 tablet by mouth once daily 06/03/19   Elby Showers, MD  mupirocin ointment (BACTROBAN) 2 % Apply 1 application topically 3 (three) times daily. 03/31/19   Robyn Haber, MD  Omega-3 Fatty Acids (FISH OIL PO) Take 1 capsule by mouth daily.    [provider]  omeprazole (PRILOSEC) 20 MG capsule Take 1 capsule by mouth once daily 12/01/19   Elby Showers, MD    Family History Family History  Problem Relation Age of Onset  . Heart failure Mother   . Heart failure Father   . Hypertension Sister   . Neuropathy  Neg Hx     Social History Social History   Tobacco Use  . Smoking status: Former Smoker    Packs/day: 1.00    Years: 10.00    Pack years: 10.00    Types: Cigarettes    Quit date: 03/30/1991    Years since quitting: 29.1  . Smokeless tobacco: Never Used  . Tobacco comment: quit smoking in Mar 1993  Substance Use Topics  . Alcohol use: No    Alcohol/week: 0.0 standard drinks  . Drug use: No  Allergies   Doxycycline   Review of Systems Review of Systems Per HPI  Physical Exam Triage Vital Signs ED Triage Vitals  Enc Vitals Group     BP 05/02/20 1432 134/79     Pulse Rate 05/02/20 1432 85     Resp 05/02/20 1432 18     Temp 05/02/20 1432 98.2 F (36.8 C)     Temp src --      SpO2 05/02/20 1432 94 %     Weight --      Height --      Head Circumference --      Peak Flow --      Pain Score 05/02/20 1431 7     Pain Loc --      Pain Edu? --      Excl. in [place]? --    No data found.  Updated Vital Signs BP 134/79   Pulse 85   Temp 98.2 F (36.8 C)   Resp 18   SpO2 94%   Visual Acuity Right Eye Distance:   Left Eye Distance:   Bilateral Distance:    Right Eye Near:   Left Eye Near:    Bilateral Near:     Physical Exam Vitals and nursing note reviewed.  Constitutional:      Appearance: Normal appearance. She is not ill-appearing.  HENT:     Head: Atraumatic.     Mouth/Throat:     Mouth: Mucous membranes are moist.     Pharynx: Oropharynx is clear.  Eyes:     Extraocular Movements: Extraocular movements intact.     Conjunctiva/sclera: Conjunctivae normal.  Cardiovascular:     Rate and Rhythm: Normal rate and regular rhythm.     Heart sounds: Normal heart sounds.  Pulmonary:     Effort: Pulmonary effort is normal.     Breath sounds: Normal breath sounds.  Musculoskeletal:        General: Normal range of motion.     Cervical back: Normal range of motion and neck supple.  Skin:    General: Skin is warm.     Comments: 3 cm laceration to dorsal  surface of right hand.  Wound edges poorly approximated.  Area of hematoma surrounding laceration.  Small amount of active bleeding, particularly with movement of the hand  Neurological:     Mental Status: She is alert and oriented to person, place, and time.  Psychiatric:        Mood and Affect: Mood normal.        Thought Content: Thought content normal.        Judgment: Judgment normal.      UC Treatments / Results  Labs (all labs ordered are listed, but only abnormal results are displayed) Labs Reviewed - No data to display  EKG   Radiology No results found.  Procedures Laceration Repair  Date/Time: 05/02/2020 3:43 PM Performed by: Volney American, PA-C Authorized by: Volney American, PA-C   Consent:    Consent obtained:  Verbal   Consent given by:  Patient   Risks, benefits, and alternatives were discussed: yes     Risks discussed:  Pain and poor cosmetic result   Alternatives discussed:  Observation Universal protocol:    Procedure explained and questions answered to patient or proxy's satisfaction: yes     Relevant documents present and verified: yes     Patient identity confirmed:  Verbally with patient and arm band Anesthesia:    Anesthesia method:  None  Laceration details:    Location:  Hand   Hand location:  R hand, dorsum   Length (cm):  3   Depth (mm):  1 Pre-procedure details:    Preparation:  Patient was prepped and draped in usual sterile fashion Exploration:    Limited defect created (wound extended): no     Hemostasis achieved with:  Direct pressure   Imaging outcome: foreign body not noted     Wound exploration: entire depth of wound visualized     Contaminated: no   Treatment:    Area cleansed with:  Saline   Amount of cleaning:  Standard   Irrigation solution:  Sterile saline   Irrigation method:  Pressure wash   Visualized foreign bodies/material removed: no     Debridement:  None   Undermining:  None   Scar revision: no      Layers repaired: Subcutaneous. Skin repair:    Repair method:  Steri-Strips Approximation:    Approximation:  Loose Repair type:    Repair type:  Simple Post-procedure details:    Dressing:  Non-adherent dressing and bulky dressing   Procedure completion:  Tolerated well, no immediate complications   (including critical care time)  Medications Ordered in UC Medications - No data to display  Initial Impression / Assessment and Plan / UC Course  I have reviewed the triage vital signs and the nursing notes.  Pertinent labs & imaging results that were available during my care of the patient were reviewed by me and considered in my medical decision making (see chart for details).     Wound irrigated today, Steri-Strips applied, nonadherent dressing placed with gauze and Coban for further protection.  Up-to-date on Tdap.  Wound care at home reviewed at length as well as return precautions.  Over the counter pain relievers as needed for pain control.  Final Clinical Impressions(s) / UC Diagnoses   Final diagnoses:  Laceration of right hand without foreign body, initial encounter   Discharge Instructions   None    ED Prescriptions    None     PDMP not reviewed this encounter.   Volney American, [place] 05/02/20 1547

## 2020-05-02 NOTE — ED Triage Notes (Signed)
Pt in with c/o skin tear on her right hand that occurred today when her dog touched her hand Pt states her skin tears easily Pt states she cleaned her hand with wound cleanser

## 2020-05-12 ENCOUNTER — Ambulatory Visit (INDEPENDENT_AMBULATORY_CARE_PROVIDER_SITE_OTHER): Payer: Medicare Other | Admitting: Pulmonary Disease

## 2020-05-12 ENCOUNTER — Other Ambulatory Visit: Payer: Self-pay

## 2020-05-12 ENCOUNTER — Encounter: Payer: Self-pay | Admitting: Pulmonary Disease

## 2020-05-12 VITALS — BP 122/80 | HR 94 | Temp 98.3°F | Ht <= 58 in | Wt 153.6 lb

## 2020-05-12 DIAGNOSIS — J302 Other seasonal allergic rhinitis: Secondary | ICD-10-CM | POA: Diagnosis not present

## 2020-05-12 DIAGNOSIS — M419 Scoliosis, unspecified: Secondary | ICD-10-CM

## 2020-05-12 DIAGNOSIS — J4540 Moderate persistent asthma, uncomplicated: Secondary | ICD-10-CM

## 2020-05-12 DIAGNOSIS — K449 Diaphragmatic hernia without obstruction or gangrene: Secondary | ICD-10-CM | POA: Diagnosis not present

## 2020-05-12 DIAGNOSIS — R942 Abnormal results of pulmonary function studies: Secondary | ICD-10-CM

## 2020-05-12 MED ORDER — TRELEGY ELLIPTA 200-62.5-25 MCG/INH IN AEPB: 1.0000 | INHALATION_SPRAY | Freq: Every day | RESPIRATORY_TRACT | 5 refills | Status: DC

## 2020-05-12 MED ORDER — TRELEGY ELLIPTA 200-62.5-25 MCG/INH IN AEPB: 1.0000 | INHALATION_SPRAY | Freq: Every day | RESPIRATORY_TRACT | 0 refills | Status: DC

## 2020-05-12 NOTE — Progress Notes (Signed)
Synopsis: Referred in Feb 2020 for chronic cough, former patient of Dr. Ashok Cordia, PCP: Elby Showers, MD  Subjective:   PATIENT ID: Terri Bridges GENDER: female DOB: 06-Dec-1934, MRN: [id number]  Chief Complaint  Patient presents with  . Follow-up    Pt states she is about the same since last visit. States she still becomes SOB with activities and exertion. Pt also has complaints of a cough that is worse in the mornings.    C/o today of ongoing bronchitis symptoms. She has had 4 sinus surgeries in the past. She stopped in march 1993 smoking, <1 ppd for 20+ years. The sinuses were an issue for sometime and she states that they were having trouble draining them. She has had ongoing cough for several years. She has hiatal hernia and GERD, she taking PPI, omeprazole. She had pfts in the past with no evidence of obstruction. Spring time seasonal allergies. She has hay fever symptoms, trouble with perfumes. She wheezes routinely at night time. Cold air and wind makes her DOE and SOB much worse. No eczema as a kid. No admissions to the hospital or visits to ER recently for respiratory complaints. Currently taking symbicort and albuterol nebs. She feels like she has to use her nebulizer every night. She has noticed that the albuterol makes her anxious and shaky. She currently uses it 3 times per day.    OV 10/24/2018: Patient seen today in follow-up for recurrent bronchitis type symptoms.  She has a 20+-pack-year history of smoking as stated above.  She does have seasonal allergies and trouble with perfumes and cold air.  Her symptoms right now are well controlled with just as needed albuterol nebulizer.  She is unable to afford the Symbicort.  She also seems to be very frustrated with her home equipment supplier.  Was also frustrated with the pharmacy.  I explained that we would not help her anyway we can with giving her samples of medications if she wanted them.  She states that she does not want to use  an inhaler she is doing fine with her nebulizer and I encouraged her to continue to do so.  PFTs again reviewed with the patient today in the office.  OV 05/12/2020: Here today for follow up.  She does have ongoing complaints today of shortness of breath.  She has difficulty making it from her house to her car.  Last time she was seen by me was in October 2020.  She has had subsequent follow-up in the office with our APP's.  Seasonal allergies perfumes and cold continue to exacerbate.  She is relatively tethered to her albuterol nebulizer.  Unable to afford Symbicort in the past.  She is not currently on any maintenance inhalers.  She is confused as to the discussions that we had last time regarding her PFT results and the fact that she has kyphoscoliosis.  I explained what I meant by her last office visit and PFT results.   Past Medical History:  Diagnosis Date  . Allergic rhinitis   . Allergy    uses Flonase daily as needed  . Anxiety    takes Xanax daily as needed  . Arthritis    "back" (04/21/2013)  . Chronic lower back pain   . Constipation    takes an OTC stool softener  . Depression    takes Zoloft daily  . Dry eyes    uses eye drops  . Exertional shortness of breath   . GERD (gastroesophageal reflux  disease)    takes Omeprazole daily and Protonix daily as needed  . H/O hiatal hernia   . Headache(784.0)    sinus  . Heart murmur    years ago  . History of bronchitis    last time many yrs ago  . Hypertension    takes Losartan and Cardizem daily  . Hypothyroidism    takes Synthroid daily  . Joint pain   . Joint swelling   . Nocturia   . Osteoporosis   . Pressure in chest 08/23/13   fell and started having chest pressure, will have ECHO and stress test 09/04/13 before surgery  . Ringing in ears    sees Dr.Byers for this  . Scoliosis   . Urinary frequency   . Urinary incontinence   . Urinary urgency      Family History  Problem Relation Age of Onset  . Heart failure  Mother   . Heart failure Father   . Hypertension Sister   . Neuropathy Neg Hx      Past Surgical History:  Procedure Laterality Date  . CARDIAC CATHETERIZATION  2005  . CATARACT EXTRACTION W/ INTRAOCULAR LENS  IMPLANT, BILATERAL Bilateral   . COLONOSCOPY    . ESOPHAGOGASTRODUODENOSCOPY    . EXCISIONAL TOTAL KNEE ARTHROPLASTY Left 09/08/2013   Procedure: EXCISIONAL TOTAL KNEE ARTHROPLASTY POLYEXCHANGE;  Surgeon: Vickey Huger, MD;  Location: [facility];  Service: Orthopedics;  Laterality: Left;  . EYE SURGERY    . JOINT REPLACEMENT    . KNEE ARTHROSCOPY Right   . NASAL SINUS SURGERY     x 4  . SHOULDER ARTHROSCOPY W/ ROTATOR CUFF REPAIR Right   . TONSILLECTOMY  1940's  . TOTAL KNEE ARTHROPLASTY Left 04/21/2013  . TOTAL KNEE ARTHROPLASTY Left 04/21/2013   Procedure: TOTAL KNEE ARTHROPLASTY;  Surgeon: Vickey Huger, MD;  Location: [facility];  Service: Orthopedics;  Laterality: Left;  . TUBAL LIGATION      Social History   Socioeconomic History  . Marital status: Single    Spouse name: Not on file  . Number of children: 6  . Years of education: 57  . Highest education level: Not on file  Occupational History  . Occupation: Retired    Fish farm manager: [employer]    Comment: also caregiver  Tobacco Use  . Smoking status: Former Smoker    Packs/day: 1.00    Years: 10.00    Pack years: 10.00    Types: Cigarettes    Quit date: 03/30/1991    Years since quitting: 29.1  . Smokeless tobacco: Never Used  . Tobacco comment: quit smoking in Mar 1993  Substance and Sexual Activity  . Alcohol use: No    Alcohol/week: 0.0 standard drinks  . Drug use: No  . Sexual activity: Never    Birth control/protection: Post-menopausal  Other Topics Concern  . Not on file  Social History Narrative   Originally from [place].    Previously worked in Engineer, mining for [employer]    Has 2 dogs currently.    Remote exposure to parrots in her current home.    No known mold exposure.    Remote travel to [place].    Has 1 indoor  plant.    Caffeine use: No soda   2 cups coffee every morning   Social Determinants of Health   Financial Resource Strain: Not on file  Food Insecurity: Not on file  Transportation Needs: Not on file  Physical Activity: Not on file  Stress: Not  on file  Social Connections: Not on file  Intimate Partner Violence: Not on file     Allergies  Allergen Reactions  . Doxycycline Rash     Outpatient Medications Prior to Visit  Medication Sig Dispense Refill  . albuterol (PROVENTIL HFA;VENTOLIN HFA) 108 (90 BASE) MCG/ACT inhaler Inhale 2 puffs into the lungs every 6 (six) hours as needed for wheezing or shortness of breath. 1 Inhaler 3  . albuterol (PROVENTIL) (2.5 MG/3ML) 0.083% nebulizer solution USE 1 VIAL IN NEBULIZER EVERY 6 HOURS AS NEEDED FOR WHEEZING OR SHORTNESS OF BREATH 150 mL prn  . ALPRAZolam (XANAX) 0.5 MG tablet Take 1 tablet (0.5 mg total) by mouth at bedtime. 90 tablet 1  . amLODipine (NORVASC) 5 MG tablet Take 1 tablet by mouth once daily 90 tablet 3  . budesonide-formoterol (SYMBICORT) 160-4.5 MCG/ACT inhaler Inhale 2 puffs into the lungs 3 times/day as needed-between meals & bedtime. 1 Inhaler 3  . Calcium-Vitamin D (CALTRATE 600 PLUS-VIT D PO) Take 1 tablet by mouth daily.    . furosemide (LASIX) 20 MG tablet Take 1 tablet (20 mg total) by mouth daily. 90 tablet 3  . ibandronate (BONIVA) 150 MG tablet Take 1 tablet (150 mg total) by mouth every 30 (thirty) days. Take in the morning with a full glass of water, on an empty stomach, and do not take anything else by mouth or lie down for the next 30 min. 1 tablet 11  . levothyroxine (SYNTHROID) 100 MCG tablet Take 1 tablet by mouth once daily 90 tablet 0  . losartan (COZAAR) 100 MG tablet Take 1 tablet by mouth once daily 90 tablet 3  . mupirocin ointment (BACTROBAN) 2 % Apply 1 application topically 3 (three) times daily. 22 g 1  . Omega-3 Fatty Acids (FISH OIL PO) Take 1 capsule by mouth daily.    Marland Kitchen omeprazole (PRILOSEC)  20 MG capsule Take 1 capsule by mouth once daily 90 capsule 3   No facility-administered medications prior to visit.    Review of Systems  Constitutional: Negative for chills, fever, malaise/fatigue and weight loss.  HENT: Negative for hearing loss, sore throat and tinnitus.   Eyes: Negative for blurred vision and double vision.  Respiratory: Positive for shortness of breath. Negative for cough, hemoptysis, sputum production, wheezing and stridor.   Cardiovascular: Negative for chest pain, palpitations, orthopnea, leg swelling and PND.  Gastrointestinal: Negative for abdominal pain, constipation, diarrhea, heartburn, nausea and vomiting.  Genitourinary: Negative for dysuria, hematuria and urgency.  Musculoskeletal: Negative for joint pain and myalgias.  Skin: Negative for itching and rash.  Neurological: Negative for dizziness, tingling, weakness and headaches.  Endo/Heme/Allergies: Negative for environmental allergies. Does not bruise/bleed easily.  Psychiatric/Behavioral: Negative for depression. The patient is not nervous/anxious and does not have insomnia.   All other systems reviewed and are negative.    Objective:  Physical Exam Vitals reviewed.  Constitutional:      General: She is not in acute distress.    Appearance: She is well-developed.     Comments: Elderly female  HENT:     Head: Normocephalic and atraumatic.  Eyes:     General: No scleral icterus.    Conjunctiva/sclera: Conjunctivae normal.     Pupils: Pupils are equal, round, and reactive to light.  Neck:     Vascular: No JVD.     Trachea: No tracheal deviation.  Cardiovascular:     Rate and Rhythm: Normal rate and regular rhythm.     Heart sounds: Normal  heart sounds. No murmur heard.   Pulmonary:     Effort: Pulmonary effort is normal. No tachypnea, accessory muscle usage or respiratory distress.     Breath sounds: No stridor. No wheezing, rhonchi or rales.     Comments: Diminished breath sounds  bilaterally Abdominal:     General: Bowel sounds are normal. There is no distension.     Palpations: Abdomen is soft.     Tenderness: There is no abdominal tenderness.  Musculoskeletal:        General: Deformity (kyphoscoliosis ) present. No tenderness.     Cervical back: Neck supple.  Lymphadenopathy:     Cervical: No cervical adenopathy.  Skin:    General: Skin is warm and dry.     Capillary Refill: Capillary refill takes less than 2 seconds.     Findings: No rash.  Neurological:     Mental Status: She is alert and oriented to person, place, and time.  Psychiatric:        Behavior: Behavior normal.      Vitals:   05/12/20 1013  BP: 122/80  Pulse: 94  Temp: 98.3 F (36.8 C)  TempSrc: Temporal  SpO2: 96%  Weight: 153 lb 9.6 oz (69.7 kg)  Height: 4\' 10"  (1.473 m)   96% on RA BMI Readings from Last 3 Encounters:  05/12/20 32.10 kg/m  04/19/20 31.77 kg/m  03/18/20 31.35 kg/m   Wt Readings from Last 3 Encounters:  05/12/20 153 lb 9.6 oz (69.7 kg)  04/19/20 152 lb (68.9 kg)  03/18/20 150 lb (68 kg)    CBC    Component Value Date/Time   WBC 5.7 03/18/2020 1209   RBC 5.18 (H) 03/18/2020 1209   HGB 14.1 03/18/2020 1209   HCT 42.6 03/18/2020 1209   PLT 197 03/18/2020 1209   MCV 82.2 03/18/2020 1209   MCH 27.2 03/18/2020 1209   MCHC 33.1 03/18/2020 1209   RDW 12.9 03/18/2020 1209   LYMPHSABS 1,613 03/18/2020 1209   MONOABS 0.5 02/26/2018 0949   EOSABS 274 03/18/2020 1209   BASOSABS 29 03/18/2020 1209    Chest Imaging: 01/31/2016: CT chest Ascending thoracic aneurysm 3.8 cm, large hiatal hernia, granuloma posterior aspect of the right upper lobe major fissure.  Aberrant right subclavian artery. The patient's images have been independently reviewed by me.    Pulmonary Functions Testing Results: PFT Results Latest Ref Rng & Units 07/29/2015  FVC-Pre L 2.15  FVC-Predicted Pre % 98  FVC-Post L 2.07  FVC-Predicted Post % 95  Pre FEV1/FVC % % 73  Post  FEV1/FCV % % 71  FEV1-Pre L 1.57  FEV1-Predicted Pre % 97  FEV1-Post L 1.47  DLCO uncorrected ml/min/mmHg 17.85  DLCO UNC% % 94  DLCO corrected ml/min/mmHg 16.85  DLCO COR %Predicted % 89  DLVA Predicted % 102  TLC L 4.50  TLC % Predicted % 101  RV % Predicted % 112     FeNO: No results found for: NITRICOXIDE  Pathology: None   Echocardiogram:  2015 Study Conclusions - Left ventricle: The cavity size was normal. Systolic function was normal. The estimated ejection fraction was in the range of 55% to 60%.  Heart Catheterization: None     Assessment & Plan:   Mixed obstructive and restrictive ventilatory defect  Moderate persistent asthma without complication  Hiatal hernia  Seasonal allergies  Kyphoscoliosis  Discussion:  86 year old female initially seen for cough, hiatal hernia, reflux, seasonal allergies.  Likely has asthma symptoms with mixed pulmonary function test of  obstructive/restrictive ventilatory defect she does have significant severe kyphoscoliosis.  Plan: At this point she is tethered to her albuterol.  She uses this multiple times a day. She needs a maintenance inhaler. Samples given today of Trelegy 200 New prescription for Trelegy 200. We need to maximize her bronchodilator regimen to see if this makes her symptoms better. She does have significant relief with albuterol use. Recommended continue use of her PPI Continued use of antihistamines for allergy management. We will see her back in a couple of months.  She would like to return in approximately 6 months and she plans to give [place] a call if this new inhaler regimen is not working for [place].  I offered for her to follow-up sooner if needed.    Current Outpatient Medications:  .  albuterol (PROVENTIL HFA;VENTOLIN HFA) 108 (90 BASE) MCG/ACT inhaler, Inhale 2 puffs into the lungs every 6 (six) hours as needed for wheezing or shortness of breath., Disp: 1 Inhaler, Rfl: 3 .  albuterol  (PROVENTIL) (2.5 MG/3ML) 0.083% nebulizer solution, USE 1 VIAL IN NEBULIZER EVERY 6 HOURS AS NEEDED FOR WHEEZING OR SHORTNESS OF BREATH, Disp: 150 mL, Rfl: prn .  ALPRAZolam (XANAX) 0.5 MG tablet, Take 1 tablet (0.5 mg total) by mouth at bedtime., Disp: 90 tablet, Rfl: 1 .  amLODipine (NORVASC) 5 MG tablet, Take 1 tablet by mouth once daily, Disp: 90 tablet, Rfl: 3 .  budesonide-formoterol (SYMBICORT) 160-4.5 MCG/ACT inhaler, Inhale 2 puffs into the lungs 3 times/day as needed-between meals & bedtime., Disp: 1 Inhaler, Rfl: 3 .  Calcium-Vitamin D (CALTRATE 600 PLUS-VIT D PO), Take 1 tablet by mouth daily., Disp: , Rfl:  .  furosemide (LASIX) 20 MG tablet, Take 1 tablet (20 mg total) by mouth daily., Disp: 90 tablet, Rfl: 3 .  ibandronate (BONIVA) 150 MG tablet, Take 1 tablet (150 mg total) by mouth every 30 (thirty) days. Take in the morning with a full glass of water, on an empty stomach, and do not take anything else by mouth or lie down for the next 30 min., Disp: 1 tablet, Rfl: 11 .  levothyroxine (SYNTHROID) 100 MCG tablet, Take 1 tablet by mouth once daily, Disp: 90 tablet, Rfl: 0 .  losartan (COZAAR) 100 MG tablet, Take 1 tablet by mouth once daily, Disp: 90 tablet, Rfl: 3 .  mupirocin ointment (BACTROBAN) 2 %, Apply 1 application topically 3 (three) times daily., Disp: 22 g, Rfl: 1 .  Omega-3 Fatty Acids (FISH OIL PO), Take 1 capsule by mouth daily., Disp: , Rfl:  .  omeprazole (PRILOSEC) 20 MG capsule, Take 1 capsule by mouth once daily, Disp: 90 capsule, Rfl: 3   Garner Nash, DO [facility] 05/12/2020 10:19 AM

## 2020-05-12 NOTE — Progress Notes (Signed)
Patient seen in the office today and instructed on use of Trelegy 200.  Patient expressed understanding and demonstrated technique.  

## 2020-05-12 NOTE — Patient Instructions (Addendum)
Thank you for visiting Dr. Valeta Harms at [facility]. Today we recommend the following:  Samples of Trelegy 200, once daily  New prescription for Trelegy 200  Continue albuterol as needed   Return in about 6 months (around 11/12/2020) for with APP or Dr. Valeta Harms.    Please do your part to reduce the spread of COVID-19.

## 2020-05-20 DIAGNOSIS — H52203 Unspecified astigmatism, bilateral: Secondary | ICD-10-CM | POA: Diagnosis not present

## 2020-05-20 DIAGNOSIS — H04123 Dry eye syndrome of bilateral lacrimal glands: Secondary | ICD-10-CM | POA: Diagnosis not present

## 2020-05-20 DIAGNOSIS — Z961 Presence of intraocular lens: Secondary | ICD-10-CM | POA: Diagnosis not present

## 2020-05-20 DIAGNOSIS — H5213 Myopia, bilateral: Secondary | ICD-10-CM | POA: Diagnosis not present

## 2020-05-27 ENCOUNTER — Telehealth: Payer: Self-pay | Admitting: Pulmonary Disease

## 2020-05-27 NOTE — Telephone Encounter (Signed)
Called and spoke with patient, she states that the trelegy inhaler was $498 at the pharmacy and she cannot afford that cost.  Advised patient to call [facility] to find out what is on the preferred list that is similar to Trelegy and then call the office back and give [place] the names of the inhalers so we can get the information to Dr. Valeta Harms so he can prescribe a different inhaler.

## 2020-05-27 NOTE — Telephone Encounter (Signed)
ATC, no answer, left VM 

## 2020-05-27 NOTE — Telephone Encounter (Signed)
Pt returning a phone call. Pt can be reached at [phone]

## 2020-06-01 ENCOUNTER — Other Ambulatory Visit: Payer: Self-pay | Admitting: Internal Medicine

## 2020-06-02 NOTE — Telephone Encounter (Signed)
Spoke with pt who states she currently makes around $2100.00 a month. Pt was informed she could apply for [place] pt assistance and pt asked if application could be mailed to her. Application was printed and placed in outgoing mail. Pt instructed to fill out application and mail back to office. Pt stated understanding. Nothing further needed at this time.

## 2020-06-02 NOTE — Telephone Encounter (Signed)
Called and spoke to pt. Pt states the alternatives to the Trelegy were just as expensive. Advised pt we could enroll her in a patient assistance program if she is eligible based off her monthly income. Per [employer] website the pt would need to make less than $2,831.25 in monthly gross income. Pt states she will look at her statements from social security and call back to see if we can enroll pt.

## 2020-06-07 ENCOUNTER — Other Ambulatory Visit: Payer: Self-pay | Admitting: Internal Medicine

## 2020-06-14 ENCOUNTER — Telehealth: Payer: Self-pay | Admitting: Pulmonary Disease

## 2020-06-14 ENCOUNTER — Other Ambulatory Visit: Payer: Self-pay | Admitting: Internal Medicine

## 2020-06-14 MED ORDER — TRELEGY ELLIPTA 100-62.5-25 MCG/INH IN AEPB: 1.0000 | INHALATION_SPRAY | Freq: Every day | RESPIRATORY_TRACT | 3 refills | Status: DC

## 2020-06-14 NOTE — Telephone Encounter (Signed)
Took patient assistance form out of Dr. Juline Patch box. Printed off Trelegy prescription to fax in with patient assistance form.  Dr. Valeta Harms signed the prescription. Form and prescription faxed.   Nothing further needed at this time.

## 2020-06-14 NOTE — Addendum Note (Signed)
Addended by: Abigail Miyamoto on: 06/14/2020 04:40 PM   Modules accepted: Orders

## 2020-06-28 ENCOUNTER — Telehealth: Payer: Self-pay | Admitting: Internal Medicine

## 2020-06-28 ENCOUNTER — Ambulatory Visit (INDEPENDENT_AMBULATORY_CARE_PROVIDER_SITE_OTHER): Payer: Medicare Other | Admitting: Internal Medicine

## 2020-06-28 ENCOUNTER — Other Ambulatory Visit: Payer: Self-pay

## 2020-06-28 ENCOUNTER — Encounter: Payer: Self-pay | Admitting: Internal Medicine

## 2020-06-28 VITALS — HR 108 | Temp 100.3°F

## 2020-06-28 DIAGNOSIS — R509 Fever, unspecified: Secondary | ICD-10-CM

## 2020-06-28 DIAGNOSIS — R059 Cough, unspecified: Secondary | ICD-10-CM

## 2020-06-28 MED ORDER — LEVOFLOXACIN 500 MG PO TABS: 500.0000 mg | ORAL_TABLET | Freq: Every day | ORAL | 0 refills | Status: AC

## 2020-06-28 MED ORDER — PREDNISONE 10 MG PO TABS: ORAL_TABLET | ORAL | 0 refills | Status: DC

## 2020-06-28 NOTE — Telephone Encounter (Signed)
scheduled

## 2020-06-28 NOTE — Patient Instructions (Signed)
Rest and drink plenty of fluids.  Monitor pulse oximetry

## 2020-06-28 NOTE — Progress Notes (Signed)
   Subjective:    Patient ID: Terri Bridges, female    DOB: December 24, 1934, 86 y.o.   MRN: [id number]  HPI 86 year old Female seen today with respiratory infection symptoms.  Did not have home COVID test to check for COVID-19 today so office visit was advised.  She has had 3 Moderna COVID vaccines the last 1 being November 2021.  Patient reports that for 4 days she has had sinus pressure, headache, coryza, discolored sputum production and chills.  Office visit was advised to evaluate.  She has history of hypertension, hypothyroidism, chronic depression, allergic rhinitis, GE reflux, recurrent sinusitis, COPD and osteoporosis.  Review of Systems denies nausea ,vomiting or wheezing.  Has no acute respiratory distress.     Objective:   Physical Exam Temperature 100.3 degrees pulse 108 pulse oximetry 93%, respiratory rate 14  Skin pale warm and dry.  TMs are clear.  Pharynx slightly injected.  Chest is clear to auscultation without rales or wheezing.       Assessment & Plan:   Acute lower respiratory infection-rule out COVID-19  Remote history of COPD  History of hypertension  History of hypothyroidism  GE reflux  History of recurrent sinusitis  History of allergic rhinitis  Plan: COVID-19 nasal swab obtained.  Reports some wheezing and shortness of breath.  Prescribed Levaquin 500 mg daily for 7 days and prednisone 10 mg (#21) starting with 6 tablets day 1 and decreasing by 1 tablet daily i.e. 6-5-4-3-2-1 taper.  Does not feel that she needs cough medication at this point in time.  Rest and drink fluids and must quarantine until PCR COVID-19 test is back.  Monitor pulse oximetry.  COVID-19 test is pending.

## 2020-06-28 NOTE — Telephone Encounter (Signed)
[name] [phone]  Terri Bridges called to say for the last 4 days she has got her normal sinus, headache, eyes draining, coughing up yellowish green stuff, and had chills yesterday. Has not done a COVID test, she does not have any home test. She has had 2 vaccines and 1 booster. She has been treating at home and also got a netty pot but none of this is helping.

## 2020-06-29 LAB — SARS-COV-2 RNA,(COVID-19) QUALITATIVE NAAT: SARS CoV2 RNA: NOT DETECTED

## 2020-07-19 ENCOUNTER — Telehealth: Payer: Self-pay | Admitting: Internal Medicine

## 2020-07-19 NOTE — Telephone Encounter (Signed)
scheduled

## 2020-07-19 NOTE — Telephone Encounter (Signed)
Terri Bridges [phone]  Loneta called to say, she just has not felt right since she was here in June, she is weak, tired all the time, light headed, off balance, legs swelling.

## 2020-07-22 ENCOUNTER — Other Ambulatory Visit: Payer: Self-pay

## 2020-07-22 ENCOUNTER — Encounter: Payer: Self-pay | Admitting: Internal Medicine

## 2020-07-22 ENCOUNTER — Ambulatory Visit (INDEPENDENT_AMBULATORY_CARE_PROVIDER_SITE_OTHER): Payer: Medicare Other | Admitting: Internal Medicine

## 2020-07-22 VITALS — BP 120/70 | HR 89 | Temp 99.0°F | Ht <= 58 in | Wt 153.0 lb

## 2020-07-22 DIAGNOSIS — F419 Anxiety disorder, unspecified: Secondary | ICD-10-CM

## 2020-07-22 DIAGNOSIS — F32A Depression, unspecified: Secondary | ICD-10-CM

## 2020-07-22 DIAGNOSIS — R42 Dizziness and giddiness: Secondary | ICD-10-CM | POA: Diagnosis not present

## 2020-07-22 DIAGNOSIS — Z8709 Personal history of other diseases of the respiratory system: Secondary | ICD-10-CM

## 2020-07-22 DIAGNOSIS — E039 Hypothyroidism, unspecified: Secondary | ICD-10-CM

## 2020-07-22 DIAGNOSIS — I10 Essential (primary) hypertension: Secondary | ICD-10-CM | POA: Diagnosis not present

## 2020-07-22 DIAGNOSIS — N1831 Chronic kidney disease, stage 3a: Secondary | ICD-10-CM

## 2020-07-22 MED ORDER — CEFTRIAXONE SODIUM 1 G IJ SOLR
1.0000 g | Freq: Once | INTRAMUSCULAR | Status: AC
  Administered 2020-07-22: 1 g via INTRAMUSCULAR

## 2020-07-22 MED ORDER — MECLIZINE HCL 25 MG PO TABS: 25.0000 mg | ORAL_TABLET | Freq: Three times a day (TID) | ORAL | 0 refills | Status: DC | PRN

## 2020-07-22 MED ORDER — METHYLPREDNISOLONE ACETATE 80 MG/ML IJ SUSP
80.0000 mg | Freq: Once | INTRAMUSCULAR | Status: AC
  Administered 2020-07-22: 80 mg via INTRAMUSCULAR

## 2020-07-22 MED ORDER — LEVOFLOXACIN 500 MG PO TABS: 500.0000 mg | ORAL_TABLET | Freq: Every day | ORAL | 0 refills | Status: AC

## 2020-07-22 NOTE — Progress Notes (Signed)
   Subjective:    Patient ID: Terri Bridges, female    DOB: 18-Mar-1934, 86 y.o.   MRN: [id number]  HPI 86 year old Female seen June 20 for lower respiratory infection with low-grade fever.  COVID-19 was ruled out with PCR testing.  Was treated with Levaquin for 7 days as well as prednisone in a tapering course.  Patient says she has not felt well since that time and now is having issues with vertigo.  Says that it time she has issues with her balance and feels dizzy.  Does not feel that respiratory infection cleared.  She has a history of recurrent sinusitis.  Feels that she has some sinus discomfort at the present time and feels this is contributing to her dizziness.  Due to her complaints, we checked a CBC and c-Met today.  Has also noticed some lower extremity edema.  Also complaining of some shortness of breath but has history of COPD and has seen Dr. Valeta Harms.  History of hypothyroidism.  History of mild chronic kidney disease.  Does not have diabetes mellitus.  History of anxiety and depression.  Is able to drive herself to the office today.    Review of Systems no nausea vomiting fever or chills     Objective:   Physical Exam She does have some mild orthostasis.  Blood pressure lying is 120/80 and standing is 110/80.  However pulse stays the same with position change.  BMI 31.98 respiratory rate is normal  Skin: Warm and dry.  No nystagmus.  Cranial nerves II through XII are grossly intact.  TMs not red.  Neck is supple.  No carotid bruits.  Chest is clear to auscultation.  Cardiac exam: Regular rate and rhythm.  She has trace lower extremity pitting edema.  Neuro is intact without focal deficits.       Assessment & Plan:  Symptoms are consistent with vertigo  May have persistent maxillary sinusitis  History of chronic kidney disease-stage III OA  Essential hypertension  History of COPD  Has lower extremity edema-this could be aggravated by course of prednisone that she took in  late June.  She is supposed to be taking Lasix 20 mg daily.  This could be increased if edema symptoms persist.  Plan: CBC and c-Met pending.  She will be treated with another 10-day course of Levaquin 500 mg daily and Antivert 25 mg 3 times daily as needed for dizziness.  She will call if symptoms or not improving.  Keep legs elevated when sitting.  Rocephin 1 g IM.  Depo-Medrol 80 mg IM.

## 2020-07-22 NOTE — Patient Instructions (Signed)
Depomedrol 80 mg IM. Rocephin 1 gram IM. Take Levaquin 500 mg daily x 10 days. Labs drawn and pending.

## 2020-07-23 LAB — COMPLETE METABOLIC PANEL WITH GFR
AG Ratio: 1.8 (calc) (ref 1.0–2.5)
ALT: 11 U/L (ref 6–29)
AST: 21 U/L (ref 10–35)
Albumin: 4.1 g/dL (ref 3.6–5.1)
Alkaline phosphatase (APISO): 60 U/L (ref 37–153)
BUN/Creatinine Ratio: 14 (calc) (ref 6–22)
BUN: 18 mg/dL (ref 7–25)
CO2: 30 mmol/L (ref 20–32)
Calcium: 9.7 mg/dL (ref 8.6–10.4)
Chloride: 105 mmol/L (ref 98–110)
Creat: 1.31 mg/dL — ABNORMAL HIGH (ref 0.60–0.95)
Globulin: 2.3 g/dL (calc) (ref 1.9–3.7)
Glucose, Bld: 89 mg/dL (ref 65–99)
Potassium: 5.2 mmol/L (ref 3.5–5.3)
Sodium: 143 mmol/L (ref 135–146)
Total Bilirubin: 0.7 mg/dL (ref 0.2–1.2)
Total Protein: 6.4 g/dL (ref 6.1–8.1)
eGFR: 40 mL/min/{1.73_m2} — ABNORMAL LOW (ref >=60)

## 2020-07-23 LAB — CBC WITH DIFFERENTIAL/PLATELET
Absolute Monocytes: 416 cells/uL (ref 200–950)
Basophils Absolute: 21 cells/uL (ref 0–200)
Basophils Relative: 0.4 %
Eosinophils Absolute: 120 cells/uL (ref 15–500)
Eosinophils Relative: 2.3 %
HCT: 40.8 % (ref 35.0–45.0)
Hemoglobin: 13.1 g/dL (ref 11.7–15.5)
Lymphs Abs: 1368 cells/uL (ref 850–3900)
MCH: 26.8 pg — ABNORMAL LOW (ref 27.0–33.0)
MCHC: 32.1 g/dL (ref 32.0–36.0)
MCV: 83.6 fL (ref 80.0–100.0)
MPV: 10.7 fL (ref 7.5–12.5)
Monocytes Relative: 8 %
Neutro Abs: 3276 cells/uL (ref 1500–7800)
Neutrophils Relative %: 63 %
Platelets: 148 10*3/uL (ref 140–400)
RBC: 4.88 10*6/uL (ref 3.80–5.10)
RDW: 14.3 % (ref 11.0–15.0)
Total Lymphocyte: 26.3 %
WBC: 5.2 10*3/uL (ref 3.8–10.8)

## 2020-08-25 ENCOUNTER — Other Ambulatory Visit: Payer: Self-pay | Admitting: Internal Medicine

## 2020-08-25 MED ORDER — ALPRAZOLAM 0.5 MG PO TABS: 0.5000 mg | ORAL_TABLET | Freq: Every day | ORAL | 1 refills | Status: DC

## 2020-08-25 NOTE — Telephone Encounter (Signed)
Rx pended, please sign.

## 2020-08-25 NOTE — Telephone Encounter (Signed)
[facility]ost [phone]  Aryona called to say she has changed pharmacies and she does not want anything else sent to the [facility] on [address] it is to far for her to drive and they do not have drive thru.   ALPRAZolam Duanne Moron) 0.5 MG tablet  [place] ([place]), [place] - [address] Phone:  [phone]  Fax:  [phone]

## 2020-09-08 ENCOUNTER — Other Ambulatory Visit: Payer: Self-pay | Admitting: Internal Medicine

## 2020-09-09 ENCOUNTER — Other Ambulatory Visit: Payer: Self-pay | Admitting: Internal Medicine

## 2020-09-20 ENCOUNTER — Ambulatory Visit (HOSPITAL_COMMUNITY)
Admission: EM | Admit: 2020-09-20 | Discharge: 2020-09-20 | Disposition: A | Discharge status: Home / Self Care | Payer: Medicare Other | Attending: Emergency Medicine | Admitting: Emergency Medicine

## 2020-09-20 ENCOUNTER — Other Ambulatory Visit: Payer: Self-pay

## 2020-09-20 ENCOUNTER — Encounter (HOSPITAL_COMMUNITY): Payer: Self-pay | Admitting: *Deleted

## 2020-09-20 DIAGNOSIS — S51812A Laceration without foreign body of left forearm, initial encounter: Secondary | ICD-10-CM | POA: Diagnosis not present

## 2020-09-20 DIAGNOSIS — S51811A Laceration without foreign body of right forearm, initial encounter: Secondary | ICD-10-CM

## 2020-09-20 MED ORDER — BACITRACIN ZINC 500 UNIT/GM EX OINT: 1.0000 "application " | TOPICAL_OINTMENT | Freq: Two times a day (BID) | CUTANEOUS | 0 refills | Status: DC

## 2020-09-20 NOTE — Discharge Instructions (Signed)
Starting tomorrow can cleanse areas with diluted warm soapy water.  And apply bacitracin ointment twice daily for 1 week  Please have your primary care doctor reevaluate your skin tears tomorrow at your appointment  In any point if you start to see drainage the area becomes tender or swollen or reddened please follow-up at urgent care with primary doctor for evaluation  You may continue use of Tylenol for pain management

## 2020-09-20 NOTE — ED Provider Notes (Signed)
[facility]    CSN: [id number] Arrival date & time: 09/20/20  0802      History   Chief Complaint Chief Complaint  Patient presents with   skin tears   Fall    HPI Terri Bridges is a 86 y.o. female.   Patient presents with wounds to bilateral forearms after fall this morning, was attempting to go to bathroom and tripped over object.  Denies hitting head or loss of consciousness.  Patient is not on blood thinners.  Range of motion of bilateral arms intact.  Denies pain outside of generalized soreness.  Denies numbness or tingling of extremities.  Has appointment with PCP tomorrow.  Past Medical History:  Diagnosis Date   Allergic rhinitis    Allergy    uses Flonase daily as needed   Anxiety    takes Xanax daily as needed   Arthritis    "back" (04/21/2013)   Chronic lower back pain    Constipation    takes an OTC stool softener   Depression    takes Zoloft daily   Dry eyes    uses eye drops   Exertional shortness of breath    GERD (gastroesophageal reflux disease)    takes Omeprazole daily and Protonix daily as needed   H/O hiatal hernia    Headache(784.0)    sinus   Heart murmur    years ago   History of bronchitis    last time many yrs ago   Hypertension    takes Losartan and Cardizem daily   Hypothyroidism    takes Synthroid daily   Joint pain    Joint swelling    Nocturia    Osteoporosis    Pressure in chest 08/23/13   fell and started having chest pressure, will have ECHO and stress test 09/04/13 before surgery   Ringing in ears    sees Dr.Byers for this   Scoliosis    Urinary frequency    Urinary incontinence    Urinary urgency     Patient Active Problem List   Diagnosis Date Noted   Asthma 03/26/2018   Left inguinal hernia 08/05/2017   Osteoarthritis of right knee 08/05/2017   Chronic pansinusitis 05/31/2016   Eustachian tube dysfunction, bilateral 05/31/2016   Sensorineural hearing loss (SNHL), bilateral 05/31/2016   Carpal  tunnel syndrome 05/12/2015   Degenerative arthritis of finger 05/12/2015   Primary arthrosis of first carpometacarpal joints, bilateral 05/12/2015   Chronic kidney disease 05/22/2014   Left knee pain 09/08/2013   H/O total knee replacement 08/11/2013   S/P total knee arthroplasty 04/21/2013   Gonalgia 04/11/2013   Osteoporosis, unspecified 03/24/2012   At high risk for falls 02/12/2012   Leg pain, bilateral 02/12/2012   Anxiety 03/06/2011   COPD (chronic obstructive pulmonary disease) ([place]) 01/08/2011   Hypertension 01/05/2011   Hypothyroidism 01/05/2011   Recurrent sinusitis 01/05/2011   Allergic rhinitis 01/05/2011   Depression 01/05/2011   GE reflux 01/05/2011    Past Surgical History:  Procedure Laterality Date   CARDIAC CATHETERIZATION  2005   CATARACT EXTRACTION W/ INTRAOCULAR LENS  IMPLANT, BILATERAL Bilateral    COLONOSCOPY     ESOPHAGOGASTRODUODENOSCOPY     EXCISIONAL TOTAL KNEE ARTHROPLASTY Left 09/08/2013   Procedure: EXCISIONAL TOTAL KNEE ARTHROPLASTY POLYEXCHANGE;  Surgeon: Vickey Huger, MD;  Location: [facility];  Service: Orthopedics;  Laterality: Left;   EYE SURGERY     JOINT REPLACEMENT     KNEE ARTHROSCOPY Right    NASAL SINUS SURGERY  x 4   SHOULDER ARTHROSCOPY W/ ROTATOR CUFF REPAIR Right    TONSILLECTOMY  1940's   TOTAL KNEE ARTHROPLASTY Left 04/21/2013   TOTAL KNEE ARTHROPLASTY Left 04/21/2013   Procedure: TOTAL KNEE ARTHROPLASTY;  Surgeon: Vickey Huger, MD;  Location: [facility];  Service: Orthopedics;  Laterality: Left;   TUBAL LIGATION      OB History   No obstetric history on file.      Home Medications    Prior to Admission medications   Medication Sig Start Date End Date Taking? Authorizing Provider  bacitracin ointment Apply 1 application topically 2 (two) times daily. 09/20/20  Yes Canuto Kingston R, NP  albuterol (PROVENTIL HFA;VENTOLIN HFA) 108 (90 BASE) MCG/ACT inhaler Inhale 2 puffs into the lungs every 6 (six) hours as needed for wheezing or  shortness of breath. 06/22/14   Elby Showers, MD  albuterol (PROVENTIL) (2.5 MG/3ML) 0.083% nebulizer solution USE 1 VIAL IN NEBULIZER EVERY 6 HOURS AS NEEDED FOR WHEEZING OR SHORTNESS OF BREATH 10/28/19   Elby Showers, MD  ALPRAZolam Duanne Moron) 0.5 MG tablet Take 1 tablet (0.5 mg total) by mouth at bedtime. 08/25/20   Elby Showers, MD  amLODipine (NORVASC) 5 MG tablet Take 1 tablet by mouth once daily 04/09/20   Elby Showers, MD  budesonide-formoterol [facility]) 160-4.5 MCG/ACT inhaler Inhale 2 puffs into the lungs 3 times/day as needed-between meals & bedtime. 03/26/18   Martyn Ehrich, NP  Calcium-Vitamin D (CALTRATE 600 PLUS-VIT D PO) Take 1 tablet by mouth daily.    [provider]  Fluticasone-Umeclidin-Vilant (TRELEGY ELLIPTA) 100-62.5-25 MCG/INH AEPB Inhale 1 puff into the lungs daily. 06/14/20   Garner Nash, DO  Fluticasone-Umeclidin-Vilant (TRELEGY ELLIPTA) 200-62.5-25 MCG/INH AEPB Inhale 1 puff into the lungs daily. 05/12/20   Icard, Octavio Graves, DO  furosemide (LASIX) 20 MG tablet Take 1 tablet (20 mg total) by mouth daily. 03/23/20   Elby Showers, MD  ibandronate (BONIVA) 150 MG tablet Take 1 tablet (150 mg total) by mouth every 30 (thirty) days. Take in the morning with a full glass of water, on an empty stomach, and do not take anything else by mouth or lie down for the next 30 min. 12/15/19   Elby Showers, MD  levothyroxine (SYNTHROID) 100 MCG tablet Take 1 tablet by mouth once daily 09/08/20   Elby Showers, MD  losartan (COZAAR) 100 MG tablet Take 1 tablet by mouth once daily 06/07/20   Elby Showers, MD  meclizine (ANTIVERT) 25 MG tablet Take 1 tablet (25 mg total) by mouth 3 (three) times daily as needed for dizziness. 07/22/20   Elby Showers, MD  mupirocin ointment (BACTROBAN) 2 % Apply 1 application topically 3 (three) times daily. 03/31/19   Robyn Haber, MD  Omega-3 Fatty Acids (FISH OIL PO) Take 1 capsule by mouth daily.    [provider]   omeprazole (PRILOSEC) 20 MG capsule Take 1 capsule by mouth once daily 09/09/20   Elby Showers, MD  predniSONE (DELTASONE) 10 MG tablet Take in tapering course as directed 6-5-4-3-2-1 06/28/20   Elby Showers, MD    Family History Family History  Problem Relation Age of Onset   Heart failure Mother    Heart failure Father    Hypertension Sister    Neuropathy Neg Hx     Social History Social History   Tobacco Use   Smoking status: Former    Packs/day: 1.00    Years: 10.00  Pack years: 10.00    Types: Cigarettes    Quit date: 03/30/1991    Years since quitting: 29.4   Smokeless tobacco: Never   Tobacco comments:    quit smoking in Mar 1993  Substance Use Topics   Alcohol use: No    Alcohol/week: 0.0 standard drinks   Drug use: No     Allergies   Doxycycline   Review of Systems Review of Systems  Constitutional: Negative.   Respiratory: Negative.    Cardiovascular: Negative.   Skin:  Positive for wound. Negative for color change, pallor and rash.  Neurological: Negative.     Physical Exam Triage Vital Signs ED Triage Vitals  Enc Vitals Group     BP 09/20/20 0825 (!) 146/75     Pulse Rate 09/20/20 0825 86     Resp 09/20/20 0825 18     Temp 09/20/20 0825 98.4 F (36.9 C)     Temp src --      SpO2 09/20/20 0825 98 %     Weight --      Height --      Head Circumference --      Peak Flow --      Pain Score 09/20/20 0823 0     Pain Loc --      Pain Edu? --      Excl. in [place]? --    No data found.  Updated Vital Signs BP (!) 146/75   Pulse 86   Temp 98.4 F (36.9 C)   Resp 18   SpO2 98%   Visual Acuity Right Eye Distance:   Left Eye Distance:   Bilateral Distance:    Right Eye Near:   Left Eye Near:    Bilateral Near:     Physical Exam Constitutional:      Appearance: Normal appearance. She is normal weight.  HENT:     Head: Normocephalic.  Eyes:     Extraocular Movements: Extraocular movements intact.  Pulmonary:     Effort:  Pulmonary effort is normal.  Musculoskeletal:        General: Normal range of motion.  Skin:         Comments: Has 2 cm skin tear on lateral right forearm, has 4 cm skin tear anterior aspect of right forearm has 3 cm skin tear anterior aspect of left forearm, not bleeding at time of exam, no foreign bodies noted, no swelling present, scattered bruising throughout forearms bilaterally, 2+ radial pulse bilaterally, range of motion intact all bilateral upper extremities  Neurological:     Mental Status: She is alert and oriented to person, place, and time. Mental status is at baseline.  Psychiatric:        Mood and Affect: Mood normal.        Behavior: Behavior normal.     UC Treatments / Results  Labs (all labs ordered are listed, but only abnormal results are displayed) Labs Reviewed - No data to display  EKG   Radiology No results found.  Procedures Laceration Repair  Date/Time: 09/20/2020 9:08 AM Performed by: Hans Eden, NP Authorized by: Hans Eden, NP   Consent:    Consent obtained:  Verbal   Consent given by:  Patient   Risks, benefits, and alternatives were discussed: yes     Risks discussed:  Infection and need for additional repair   Alternatives discussed:  No treatment Universal protocol:    Procedure explained and questions answered to patient or proxy's satisfaction:  yes     Patient identity confirmed:  Verbally with patient Anesthesia:    Anesthesia method:  None Laceration details:    Location:  Shoulder/arm   Shoulder/arm location:  L lower arm (right lower arm)   Length (cm):  2 (2 cm, 4 cm right arm respectivally, 3 cm left arm) Treatment:    Area cleansed with:  Chlorhexidine   Amount of cleaning:  Standard   Debridement:  None Skin repair:    Repair method:  Tissue adhesive Approximation:    Approximation:  Close Repair type:    Repair type:  Simple Post-procedure details:    Dressing:  Open (no dressing)   Procedure  completion:  Tolerated (including critical care time)  Medications Ordered in UC Medications - No data to display  Initial Impression / Assessment and Plan / UC Course  I have reviewed the triage vital signs and the nursing notes.  Pertinent labs & imaging results that were available during my care of the patient were reviewed by me and considered in my medical decision making (see chart for details).  Skin tear of left forearm without complication Skin tear right forearm without complication  1.  Patient advised starting tomorrow to cleanse with diluted warm soapy water then apply bacitracin ointment twice a day for 7 days 2.  Has PCP appointment tomorrow 9/13, instructed to have PCP reevaluate wound site was 3.  Patient with history of return precautions for signs of infection follow-up in urgent care or with primary care doctor   Final Clinical Impressions(s) / UC Diagnoses   Final diagnoses:  Skin tear of forearm without complication, left, initial encounter  Skin tear of forearm without complication, right, initial encounter     Discharge Instructions      Starting tomorrow can cleanse areas with diluted warm soapy water.  And apply bacitracin ointment twice daily for 1 week  Please have your primary care doctor reevaluate your skin tears tomorrow at your appointment  In any point if you start to see drainage the area becomes tender or swollen or reddened please follow-up at urgent care with primary doctor for evaluation  You may continue use of Tylenol for pain management   ED Prescriptions     Medication Sig Dispense Auth. Provider   bacitracin ointment Apply 1 application topically 2 (two) times daily. 120 g Hans Eden, NP      PDMP not reviewed this encounter.   Hans Eden, [place] 09/20/20 [phone]

## 2020-09-20 NOTE — ED Triage Notes (Signed)
Pt reports a trip and fall this AM and has skin tears on both arms. Pt does not take blood thinners.

## 2020-09-21 ENCOUNTER — Encounter: Payer: Self-pay | Admitting: Internal Medicine

## 2020-09-21 ENCOUNTER — Other Ambulatory Visit: Payer: Medicare Other | Admitting: Internal Medicine

## 2020-09-21 DIAGNOSIS — E039 Hypothyroidism, unspecified: Secondary | ICD-10-CM | POA: Diagnosis not present

## 2020-09-21 DIAGNOSIS — N1831 Chronic kidney disease, stage 3a: Secondary | ICD-10-CM | POA: Diagnosis not present

## 2020-09-21 DIAGNOSIS — I10 Essential (primary) hypertension: Secondary | ICD-10-CM

## 2020-09-21 DIAGNOSIS — W19XXXD Unspecified fall, subsequent encounter: Secondary | ICD-10-CM

## 2020-09-21 DIAGNOSIS — R7989 Other specified abnormal findings of blood chemistry: Secondary | ICD-10-CM | POA: Diagnosis not present

## 2020-09-21 DIAGNOSIS — S41111D Laceration without foreign body of right upper arm, subsequent encounter: Secondary | ICD-10-CM

## 2020-09-21 DIAGNOSIS — S41112D Laceration without foreign body of left upper arm, subsequent encounter: Secondary | ICD-10-CM

## 2020-09-21 NOTE — Patient Instructions (Signed)
She was seen at Urgent Care by NP yesterday after a fall. Had multiple contusions on arms bilaterally and a superficial laceration on left arm and right arm. Was here only for phlebotomy today. Phlebotomist uncomfortable drawing blood from antecubital fossa bilaterally. Advised cleaning wound with peroxide and apply Bactroban ointment twice a day. Has appt here in 2 days that has previously been scheduled. We can draw blood from dorsum of hand without issue today and phlebotomist did this at my direction.

## 2020-09-22 LAB — LIPID PANEL
Cholesterol: 192 mg/dL (ref <200)
HDL: 79 mg/dL (ref >=50)
LDL Cholesterol (Calc): 99 mg/dL (calc)
Non-HDL Cholesterol (Calc): 113 mg/dL (calc) (ref <130)
Total CHOL/HDL Ratio: 2.4 (calc) (ref <5.0)
Triglycerides: 55 mg/dL (ref <150)

## 2020-09-22 LAB — COMPLETE METABOLIC PANEL WITH GFR
AG Ratio: 2 (calc) (ref 1.0–2.5)
ALT: 11 U/L (ref 6–29)
AST: 20 U/L (ref 10–35)
Albumin: 4.3 g/dL (ref 3.6–5.1)
Alkaline phosphatase (APISO): 59 U/L (ref 37–153)
BUN/Creatinine Ratio: 19 (calc) (ref 6–22)
BUN: 25 mg/dL (ref 7–25)
CO2: 23 mmol/L (ref 20–32)
Calcium: 9 mg/dL (ref 8.6–10.4)
Chloride: 103 mmol/L (ref 98–110)
Creat: 1.33 mg/dL — ABNORMAL HIGH (ref 0.60–0.95)
Globulin: 2.1 g/dL (calc) (ref 1.9–3.7)
Glucose, Bld: 78 mg/dL (ref 65–99)
Potassium: 4.2 mmol/L (ref 3.5–5.3)
Sodium: 140 mmol/L (ref 135–146)
Total Bilirubin: 0.5 mg/dL (ref 0.2–1.2)
Total Protein: 6.4 g/dL (ref 6.1–8.1)
eGFR: 39 mL/min/{1.73_m2} — ABNORMAL LOW (ref >=60)

## 2020-09-22 LAB — CBC WITH DIFFERENTIAL/PLATELET
Absolute Monocytes: 442 cells/uL (ref 200–950)
Basophils Absolute: 28 cells/uL (ref 0–200)
Basophils Relative: 0.5 %
Eosinophils Absolute: 174 cells/uL (ref 15–500)
Eosinophils Relative: 3.1 %
HCT: 41.5 % (ref 35.0–45.0)
Hemoglobin: 13.3 g/dL (ref 11.7–15.5)
Lymphs Abs: 1663 cells/uL (ref 850–3900)
MCH: 27.7 pg (ref 27.0–33.0)
MCHC: 32 g/dL (ref 32.0–36.0)
MCV: 86.5 fL (ref 80.0–100.0)
MPV: 10.5 fL (ref 7.5–12.5)
Monocytes Relative: 7.9 %
Neutro Abs: 3293 cells/uL (ref 1500–7800)
Neutrophils Relative %: 58.8 %
Platelets: 168 10*3/uL (ref 140–400)
RBC: 4.8 10*6/uL (ref 3.80–5.10)
RDW: 13.6 % (ref 11.0–15.0)
Total Lymphocyte: 29.7 %
WBC: 5.6 10*3/uL (ref 3.8–10.8)

## 2020-09-22 LAB — TSH: TSH: 0.84 mIU/L (ref 0.40–4.50)

## 2020-09-24 ENCOUNTER — Other Ambulatory Visit: Payer: Self-pay

## 2020-09-24 ENCOUNTER — Encounter: Payer: Self-pay | Admitting: Internal Medicine

## 2020-09-24 ENCOUNTER — Ambulatory Visit (INDEPENDENT_AMBULATORY_CARE_PROVIDER_SITE_OTHER): Payer: Medicare Other | Admitting: Internal Medicine

## 2020-09-24 VITALS — BP 128/72 | HR 81 | Ht <= 58 in | Wt 153.0 lb

## 2020-09-24 DIAGNOSIS — M1711 Unilateral primary osteoarthritis, right knee: Secondary | ICD-10-CM

## 2020-09-24 DIAGNOSIS — I8393 Asymptomatic varicose veins of bilateral lower extremities: Secondary | ICD-10-CM

## 2020-09-24 DIAGNOSIS — F419 Anxiety disorder, unspecified: Secondary | ICD-10-CM

## 2020-09-24 DIAGNOSIS — W19XXXD Unspecified fall, subsequent encounter: Secondary | ICD-10-CM

## 2020-09-24 DIAGNOSIS — N1831 Chronic kidney disease, stage 3a: Secondary | ICD-10-CM

## 2020-09-24 DIAGNOSIS — Z23 Encounter for immunization: Secondary | ICD-10-CM

## 2020-09-24 DIAGNOSIS — F32A Depression, unspecified: Secondary | ICD-10-CM

## 2020-09-24 DIAGNOSIS — S40021D Contusion of right upper arm, subsequent encounter: Secondary | ICD-10-CM | POA: Diagnosis not present

## 2020-09-24 DIAGNOSIS — M810 Age-related osteoporosis without current pathological fracture: Secondary | ICD-10-CM | POA: Diagnosis not present

## 2020-09-24 DIAGNOSIS — S40022D Contusion of left upper arm, subsequent encounter: Secondary | ICD-10-CM

## 2020-09-24 DIAGNOSIS — I10 Essential (primary) hypertension: Secondary | ICD-10-CM | POA: Diagnosis not present

## 2020-09-24 DIAGNOSIS — Z8709 Personal history of other diseases of the respiratory system: Secondary | ICD-10-CM

## 2020-09-24 DIAGNOSIS — F5105 Insomnia due to other mental disorder: Secondary | ICD-10-CM

## 2020-09-24 DIAGNOSIS — F409 Phobic anxiety disorder, unspecified: Secondary | ICD-10-CM

## 2020-09-24 DIAGNOSIS — E039 Hypothyroidism, unspecified: Secondary | ICD-10-CM

## 2020-09-24 MED ORDER — ALPRAZOLAM 0.5 MG PO TABS: 0.5000 mg | ORAL_TABLET | Freq: Every day | ORAL | 5 refills | Status: DC

## 2020-09-24 NOTE — Patient Instructions (Addendum)
Flu vaccine given. To get Covid update at pharmacy soon. Look into Life Alert device since you reside alone. Continue current meds and RTC in 6 months for medicare wellness and health maintenance exam.

## 2020-09-24 NOTE — Progress Notes (Signed)
   Subjective:    Patient ID: Terri Bridges, female    DOB: 19-Mar-1934, 86 y.o.   MRN: [id number]  HPI 86  year old Female for 6 month recheck. Took a terrible fall inside her home September 12. Tripped somehow and had multiple contusions of upper arms. Did not strike head and had no LOC. Went to Urgent Care and was checked out. Today discussed having Lifeline device with her at all times. Says she will get daughter to check into it. 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 Has a dog but dog did not trip her.  She has chronic kidney is stable.  Creatinine is 1.33.  Lipids are normal.  TSH is normal.  CBC is normal.  Liver functions are normal.  She has hypothyroidism, COPD, hypertension, GE reflux, osteoporosis treated with Boniva with T score on bone density study 2019 of -3.  Review of Systems she has no new complaints and is recovering well with multiple contusions of her arms     Objective:   Physical Exam VS reviewed Multiple contusions of upper arms.  Superficial lacerations are healing of the upper arms. Alert and oriented x 3. Chest is clear. Cor:RRR No LE edema.       Assessment & Plan:  COPD- stable with inhalers and no recent exacerbations  Anxiety/Insomnia- refilled Xanax to sleep at night  Fall at home- multiple contusions upper extremities- needs Life Alert device since she resides alone  HTN - stable on current regimen  Hypothyroidism- stable on thyroid replacement  GERD treated with PPI  Osteoporosis- T score 2019  was -3.0 treated with Boniva  CKD Stage 3a- stable  Varicose veins in LEs- stable  Osteoarthritis right knee stable  Flu vaccine given     Plan: Continue current meds and RTC in 6 months for CPE and Medicare wellness visit. Needs Covid update at pharmacy soon. Flu vaccine given. Look into Life Alert device to wear if have accident or medical emergency since resides alone.

## 2020-12-10 ENCOUNTER — Other Ambulatory Visit: Payer: Self-pay | Admitting: Internal Medicine

## 2020-12-21 ENCOUNTER — Ambulatory Visit (HOSPITAL_COMMUNITY)
Admission: EM | Admit: 2020-12-21 | Discharge: 2020-12-21 | Disposition: A | Discharge status: Home / Self Care | Payer: Medicare Other | Attending: Physician Assistant | Admitting: Physician Assistant

## 2020-12-21 ENCOUNTER — Encounter (HOSPITAL_COMMUNITY): Payer: Self-pay

## 2020-12-21 ENCOUNTER — Other Ambulatory Visit: Payer: Self-pay

## 2020-12-21 DIAGNOSIS — S81802A Unspecified open wound, left lower leg, initial encounter: Secondary | ICD-10-CM | POA: Diagnosis not present

## 2020-12-21 DIAGNOSIS — L03116 Cellulitis of left lower limb: Secondary | ICD-10-CM

## 2020-12-21 MED ORDER — CEPHALEXIN 500 MG PO CAPS: 500.0000 mg | ORAL_CAPSULE | Freq: Three times a day (TID) | ORAL | 0 refills | Status: DC

## 2020-12-21 MED ORDER — MUPIROCIN 2 % EX OINT: 1.0000 "application " | TOPICAL_OINTMENT | Freq: Every day | CUTANEOUS | 0 refills | Status: DC

## 2020-12-21 NOTE — Discharge Instructions (Signed)
I believe that you have an infection.  Please start cephalexin 3 times a day.  This can upset your stomach today with food.  Keep this area clean and covered.  Use Bactroban ointment with dressing changes.  Monitor the area of redness and if this continues to spread even after being on antibiotics you need to be seen immediately.  If you develop any worsening symptoms including fever, nausea, vomiting, body aches, spread of redness, increased pain you need to go to the emergency room.  Follow-up with your PCP later this week for reevaluation.

## 2020-12-21 NOTE — ED Provider Notes (Signed)
[place]    CSN: [id number] Arrival date & time: 12/21/20  0802      History   Chief Complaint Chief Complaint  Patient presents with   Leg Pain    HPI Terri Bridges is a 86 y.o. female.   Patient presents today with a 2-week history of wound on her left lower leg that has worsened in the past several days.  Reports that she has a history of recurrent skin tears and current episode began 2 weeks ago and was healing appropriately with conservative treatment including topical antibiotics and salt water soaks.  Over the past 3 days she has had worsening pain and enlarging redness.  Last night she reports that the pain was severe and kept her up at night, described as throbbing, localized to affected area, rated 6 on a 0-10 pain scale.  She reports associated swelling, redness, heat in the area.  Denies any systemic symptoms including fever, nausea, vomiting, headache, dizziness.  She denies history of diabetes or immunosuppression.  Denies history of recurrent skin infections or MRSA.  Denies any recent antibiotic use.   Past Medical History:  Diagnosis Date   Allergic rhinitis    Allergy    uses Flonase daily as needed   Anxiety    takes Xanax daily as needed   Arthritis    "back" (04/21/2013)   Chronic lower back pain    Constipation    takes an OTC stool softener   Depression    takes Zoloft daily   Dry eyes    uses eye drops   Exertional shortness of breath    GERD (gastroesophageal reflux disease)    takes Omeprazole daily and Protonix daily as needed   H/O hiatal hernia    Headache(784.0)    sinus   Heart murmur    years ago   History of bronchitis    last time many yrs ago   Hypertension    takes Losartan and Cardizem daily   Hypothyroidism    takes Synthroid daily   Joint pain    Joint swelling    Nocturia    Osteoporosis    Pressure in chest 08/23/13   fell and started having chest pressure, will have ECHO and stress test 09/04/13 before  surgery   Ringing in ears    sees Dr.Byers for this   Scoliosis    Urinary frequency    Urinary incontinence    Urinary urgency     Patient Active Problem List   Diagnosis Date Noted   Asthma 03/26/2018   Left inguinal hernia 08/05/2017   Osteoarthritis of right knee 08/05/2017   Chronic pansinusitis 05/31/2016   Eustachian tube dysfunction, bilateral 05/31/2016   Sensorineural hearing loss (SNHL), bilateral 05/31/2016   Carpal tunnel syndrome 05/12/2015   Degenerative arthritis of finger 05/12/2015   Primary arthrosis of first carpometacarpal joints, bilateral 05/12/2015   Chronic kidney disease 05/22/2014   Left knee pain 09/08/2013   H/O total knee replacement 08/11/2013   S/P total knee arthroplasty 04/21/2013   Gonalgia 04/11/2013   Osteoporosis, unspecified 03/24/2012   At high risk for falls 02/12/2012   Leg pain, bilateral 02/12/2012   Anxiety 03/06/2011   COPD (chronic obstructive pulmonary disease) ([place]) 01/08/2011   Hypertension 01/05/2011   Hypothyroidism 01/05/2011   Recurrent sinusitis 01/05/2011   Allergic rhinitis 01/05/2011   Depression 01/05/2011   GE reflux 01/05/2011    Past Surgical History:  Procedure Laterality Date   CARDIAC CATHETERIZATION  2005  CATARACT EXTRACTION W/ INTRAOCULAR LENS  IMPLANT, BILATERAL Bilateral    COLONOSCOPY     ESOPHAGOGASTRODUODENOSCOPY     EXCISIONAL TOTAL KNEE ARTHROPLASTY Left 09/08/2013   Procedure: EXCISIONAL TOTAL KNEE ARTHROPLASTY POLYEXCHANGE;  Surgeon: Vickey Huger, MD;  Location: [facility];  Service: Orthopedics;  Laterality: Left;   EYE SURGERY     JOINT REPLACEMENT     KNEE ARTHROSCOPY Right    NASAL SINUS SURGERY     x 4   SHOULDER ARTHROSCOPY W/ ROTATOR CUFF REPAIR Right    TONSILLECTOMY  1940's   TOTAL KNEE ARTHROPLASTY Left 04/21/2013   TOTAL KNEE ARTHROPLASTY Left 04/21/2013   Procedure: TOTAL KNEE ARTHROPLASTY;  Surgeon: Vickey Huger, MD;  Location: [facility];  Service: Orthopedics;  Laterality: Left;    TUBAL LIGATION      OB History   No obstetric history on file.      Home Medications    Prior to Admission medications   Medication Sig Start Date End Date Taking? Authorizing Provider  cephALEXin (KEFLEX) 500 MG capsule Take 1 capsule (500 mg total) by mouth 3 (three) times daily. 12/21/20  Yes Nahima Ales, Junie Panning K, PA-C  mupirocin ointment (BACTROBAN) 2 % Apply 1 application topically daily. 12/21/20  Yes Davanna He K, PA-C  albuterol (PROVENTIL HFA;VENTOLIN HFA) 108 (90 BASE) MCG/ACT inhaler Inhale 2 puffs into the lungs every 6 (six) hours as needed for wheezing or shortness of breath. 06/22/14   Elby Showers, MD  albuterol (PROVENTIL) (2.5 MG/3ML) 0.083% nebulizer solution USE 1 VIAL IN NEBULIZER EVERY 6 HOURS AS NEEDED FOR WHEEZING OR SHORTNESS OF BREATH 10/28/19   Elby Showers, MD  ALPRAZolam Duanne Moron) 0.5 MG tablet Take 1 tablet (0.5 mg total) by mouth at bedtime. 09/24/20   Elby Showers, MD  amLODipine (NORVASC) 5 MG tablet Take 1 tablet by mouth once daily 04/09/20   Elby Showers, MD  budesonide-formoterol [employer]) 160-4.5 MCG/ACT inhaler Inhale 2 puffs into the lungs 3 times/day as needed-between meals & bedtime. 03/26/18   Martyn Ehrich, NP  Calcium-Vitamin D (CALTRATE 600 PLUS-VIT D PO) Take 1 tablet by mouth daily.    [provider]  Fluticasone-Umeclidin-Vilant (TRELEGY ELLIPTA) 100-62.5-25 MCG/INH AEPB Inhale 1 puff into the lungs daily. 06/14/20   Garner Nash, DO  Fluticasone-Umeclidin-Vilant (TRELEGY ELLIPTA) 200-62.5-25 MCG/INH AEPB Inhale 1 puff into the lungs daily. 05/12/20   Icard, Octavio Graves, DO  furosemide (LASIX) 20 MG tablet Take 1 tablet (20 mg total) by mouth daily. 03/23/20   Elby Showers, MD  ibandronate (BONIVA) 150 MG tablet Take 1 tablet (150 mg total) by mouth every 30 (thirty) days. Take in the morning with a full glass of water, on an empty stomach, and do not take anything else by mouth or lie down for the next 30 min. 12/15/19   Elby Showers, MD  levothyroxine (SYNTHROID) 100 MCG tablet Take 1 tablet by mouth once daily 12/10/20   Elby Showers, MD  losartan (COZAAR) 100 MG tablet Take 1 tablet by mouth once daily 06/07/20   Elby Showers, MD  meclizine (ANTIVERT) 25 MG tablet Take 1 tablet (25 mg total) by mouth 3 (three) times daily as needed for dizziness. 07/22/20   Elby Showers, MD  Omega-3 Fatty Acids (FISH OIL PO) Take 1 capsule by mouth daily.    [provider]  omeprazole (PRILOSEC) 20 MG capsule Take 1 capsule by mouth once daily 09/09/20   Baxley, Cresenciano Lick, MD  predniSONE (DELTASONE) 10 MG tablet Take in tapering course as directed 6-5-4-3-2-1 06/28/20   Elby Showers, MD    Family History Family History  Problem Relation Age of Onset   Heart failure Mother    Heart failure Father    Hypertension Sister    Neuropathy Neg Hx     Social History Social History   Tobacco Use   Smoking status: Former    Packs/day: 1.00    Years: 10.00    Pack years: 10.00    Types: Cigarettes    Quit date: 03/30/1991    Years since quitting: 29.7   Smokeless tobacco: Never   Tobacco comments:    quit smoking in Mar 1993  Substance Use Topics   Alcohol use: No    Alcohol/week: 0.0 standard drinks   Drug use: No     Allergies   Doxycycline   Review of Systems Review of Systems  Constitutional:  Positive for activity change. Negative for appetite change, fatigue and fever.  Respiratory:  Negative for cough and shortness of breath.   Cardiovascular:  Negative for chest pain.  Gastrointestinal:  Negative for abdominal pain, diarrhea, nausea and vomiting.  Skin:  Positive for color change and wound.  Neurological:  Negative for dizziness, light-headedness and headaches.    Physical Exam Triage Vital Signs ED Triage Vitals  Enc Vitals Group     BP 12/21/20 0824 (!) 144/74     Pulse Rate 12/21/20 0824 88     Resp 12/21/20 0824 19     Temp 12/21/20 0824 98.4 F (36.9 C)     Temp Source 12/21/20 0824  Oral     SpO2 12/21/20 0824 94 %     Weight --      Height --      Head Circumference --      Peak Flow --      Pain Score 12/21/20 0821 6     Pain Loc --      Pain Edu? --      Excl. in [place]? --    No data found.  Updated Vital Signs BP (!) 144/74 (BP Location: Right Arm)   Pulse 88   Temp 98.4 F (36.9 C) (Oral)   Resp 19   SpO2 94%   Visual Acuity Right Eye Distance:   Left Eye Distance:   Bilateral Distance:    Right Eye Near:   Left Eye Near:    Bilateral Near:     Physical Exam Vitals reviewed.  Constitutional:      General: She is awake. She is not in acute distress.    Appearance: Normal appearance. She is well-developed. She is not ill-appearing.     Comments: Very pleasant female appears stated age in no acute distress sitting comfortably on exam room table  HENT:     Head: Normocephalic and atraumatic.  Cardiovascular:     Rate and Rhythm: Normal rate and regular rhythm.     Heart sounds: Normal heart sounds, S1 normal and S2 normal. No murmur heard. Pulmonary:     Effort: Pulmonary effort is normal.     Breath sounds: Normal breath sounds. No wheezing, rhonchi or rales.     Comments: Clear to auscultation bilaterally Abdominal:     Palpations: Abdomen is soft.     Tenderness: There is no abdominal tenderness.  Skin:    Findings: Abrasion and erythema present.     Comments: 3 cm x 3 cm abrasion noted anterior left leg  with surrounding erythema.  Area is tender to palpation and warm to touch.  No streaking or evidence of lymphangitis.  No bleeding or drainage noted.  Psychiatric:        Behavior: Behavior is cooperative.      UC Treatments / Results  Labs (all labs ordered are listed, but only abnormal results are displayed) Labs Reviewed - No data to display  EKG   Radiology No results found.  Procedures Procedures (including critical care time)  Medications Ordered in UC Medications - No data to display  Initial Impression /  Assessment and Plan / UC Course  I have reviewed the triage vital signs and the nursing notes.  Pertinent labs & imaging results that were available during my care of the patient were reviewed by me and considered in my medical decision making (see chart for details).     Given increased erythema and swelling concern for cellulitis.  We will start patient on cephalexin 500 mg 3 times a day which is reduced based on kidney function; calculated creatinine clearance of 34.5 milliliters per minute based on CMP from 09/21/2020.  She was given Bactroban to apply with dressing changes and encouraged to keep area clean and dry.  Recommend she monitor area of erythema and if this continues to spread despite antibiotic she is to be reevaluated.  Discussed alarm symptoms that warrant emergent evaluation including increased erythema, fever, increased pain.  Recommended she follow-up with either our clinic or PCP within a few days to ensure symptom improvement.  Strict return precautions given to which she expressed understanding.  Final Clinical Impressions(s) / UC Diagnoses   Final diagnoses:  Wound of left lower extremity, initial encounter  Cellulitis of left lower extremity     Discharge Instructions      I believe that you have an infection.  Please start cephalexin 3 times a day.  This can upset your stomach today with food.  Keep this area clean and covered.  Use Bactroban ointment with dressing changes.  Monitor the area of redness and if this continues to spread even after being on antibiotics you need to be seen immediately.  If you develop any worsening symptoms including fever, nausea, vomiting, body aches, spread of redness, increased pain you need to go to the emergency room.  Follow-up with your PCP later this week for reevaluation.     ED Prescriptions     Medication Sig Dispense Auth. Provider   mupirocin ointment (BACTROBAN) 2 % Apply 1 application topically daily. 22 g [place] Basham  K, PA-C   cephALEXin (KEFLEX) 500 MG capsule Take 1 capsule (500 mg total) by mouth 3 (three) times daily. 21 capsule Joycie Aerts K, PA-C      PDMP not reviewed this encounter.   Terrilee Croak, PA-C 12/21/20 0349

## 2020-12-21 NOTE — ED Triage Notes (Signed)
Pt presents with c/o her skin tearing on the L leg. States her leg throbs and becomes swollen.

## 2021-01-31 ENCOUNTER — Other Ambulatory Visit: Payer: Self-pay | Admitting: Internal Medicine

## 2021-03-11 ENCOUNTER — Other Ambulatory Visit: Payer: Self-pay

## 2021-03-11 MED ORDER — IBANDRONATE SODIUM 150 MG PO TABS: 150.0000 mg | ORAL_TABLET | ORAL | 11 refills | Status: DC

## 2021-03-17 ENCOUNTER — Other Ambulatory Visit: Payer: Medicare HMO | Admitting: Internal Medicine

## 2021-03-17 ENCOUNTER — Other Ambulatory Visit: Payer: Self-pay

## 2021-03-17 DIAGNOSIS — I10 Essential (primary) hypertension: Secondary | ICD-10-CM | POA: Diagnosis not present

## 2021-03-17 DIAGNOSIS — E039 Hypothyroidism, unspecified: Secondary | ICD-10-CM | POA: Diagnosis not present

## 2021-03-17 DIAGNOSIS — Z136 Encounter for screening for cardiovascular disorders: Secondary | ICD-10-CM | POA: Diagnosis not present

## 2021-03-18 ENCOUNTER — Other Ambulatory Visit: Payer: Self-pay | Admitting: Internal Medicine

## 2021-03-18 LAB — CBC WITH DIFFERENTIAL/PLATELET
Absolute Monocytes: 338 cells/uL (ref 200–950)
Basophils Absolute: 21 cells/uL (ref 0–200)
Basophils Relative: 0.4 %
Eosinophils Absolute: 348 cells/uL (ref 15–500)
Eosinophils Relative: 6.7 %
HCT: 39.8 % (ref 35.0–45.0)
Hemoglobin: 12.6 g/dL (ref 11.7–15.5)
Lymphs Abs: 1508 cells/uL (ref 850–3900)
MCH: 26.4 pg — ABNORMAL LOW (ref 27.0–33.0)
MCHC: 31.7 g/dL — ABNORMAL LOW (ref 32.0–36.0)
MCV: 83.4 fL (ref 80.0–100.0)
MPV: 10.7 fL (ref 7.5–12.5)
Monocytes Relative: 6.5 %
Neutro Abs: 2985 cells/uL (ref 1500–7800)
Neutrophils Relative %: 57.4 %
Platelets: 154 10*3/uL (ref 140–400)
RBC: 4.77 10*6/uL (ref 3.80–5.10)
RDW: 13.5 % (ref 11.0–15.0)
Total Lymphocyte: 29 %
WBC: 5.2 10*3/uL (ref 3.8–10.8)

## 2021-03-18 LAB — COMPLETE METABOLIC PANEL WITH GFR
AG Ratio: 1.7 (calc) (ref 1.0–2.5)
ALT: 10 U/L (ref 6–29)
AST: 16 U/L (ref 10–35)
Albumin: 3.9 g/dL (ref 3.6–5.1)
Alkaline phosphatase (APISO): 54 U/L (ref 37–153)
BUN/Creatinine Ratio: 16 (calc) (ref 6–22)
BUN: 21 mg/dL (ref 7–25)
CO2: 28 mmol/L (ref 20–32)
Calcium: 9.1 mg/dL (ref 8.6–10.4)
Chloride: 106 mmol/L (ref 98–110)
Creat: 1.35 mg/dL — ABNORMAL HIGH (ref 0.60–0.95)
Globulin: 2.3 g/dL (calc) (ref 1.9–3.7)
Glucose, Bld: 100 mg/dL — ABNORMAL HIGH (ref 65–99)
Potassium: 4.4 mmol/L (ref 3.5–5.3)
Sodium: 143 mmol/L (ref 135–146)
Total Bilirubin: 0.8 mg/dL (ref 0.2–1.2)
Total Protein: 6.2 g/dL (ref 6.1–8.1)
eGFR: 39 mL/min/{1.73_m2} — ABNORMAL LOW (ref >=60)

## 2021-03-18 LAB — LIPID PANEL
Cholesterol: 189 mg/dL (ref <200)
HDL: 60 mg/dL (ref >=50)
LDL Cholesterol (Calc): 113 mg/dL (calc) — ABNORMAL HIGH
Non-HDL Cholesterol (Calc): 129 mg/dL (calc) (ref <130)
Total CHOL/HDL Ratio: 3.2 (calc) (ref <5.0)
Triglycerides: 70 mg/dL (ref <150)

## 2021-03-18 LAB — TSH: TSH: 1.59 mIU/L (ref 0.40–4.50)

## 2021-03-21 ENCOUNTER — Other Ambulatory Visit: Payer: Self-pay

## 2021-03-21 ENCOUNTER — Ambulatory Visit (INDEPENDENT_AMBULATORY_CARE_PROVIDER_SITE_OTHER): Payer: Medicare HMO | Admitting: Internal Medicine

## 2021-03-21 ENCOUNTER — Encounter: Payer: Self-pay | Admitting: Internal Medicine

## 2021-03-21 VITALS — BP 120/80 | HR 88 | Ht 59.25 in | Wt 151.0 lb

## 2021-03-21 DIAGNOSIS — E039 Hypothyroidism, unspecified: Secondary | ICD-10-CM

## 2021-03-21 DIAGNOSIS — Z1231 Encounter for screening mammogram for malignant neoplasm of breast: Secondary | ICD-10-CM

## 2021-03-21 DIAGNOSIS — F5105 Insomnia due to other mental disorder: Secondary | ICD-10-CM | POA: Diagnosis not present

## 2021-03-21 DIAGNOSIS — I10 Essential (primary) hypertension: Secondary | ICD-10-CM | POA: Diagnosis not present

## 2021-03-21 DIAGNOSIS — M810 Age-related osteoporosis without current pathological fracture: Secondary | ICD-10-CM

## 2021-03-21 DIAGNOSIS — F419 Anxiety disorder, unspecified: Secondary | ICD-10-CM | POA: Diagnosis not present

## 2021-03-21 DIAGNOSIS — N1831 Chronic kidney disease, stage 3a: Secondary | ICD-10-CM | POA: Diagnosis not present

## 2021-03-21 DIAGNOSIS — Z0000 Encounter for general adult medical examination without abnormal findings: Secondary | ICD-10-CM

## 2021-03-21 DIAGNOSIS — K4090 Unilateral inguinal hernia, without obstruction or gangrene, not specified as recurrent: Secondary | ICD-10-CM

## 2021-03-21 DIAGNOSIS — M1711 Unilateral primary osteoarthritis, right knee: Secondary | ICD-10-CM | POA: Diagnosis not present

## 2021-03-21 DIAGNOSIS — Z8709 Personal history of other diseases of the respiratory system: Secondary | ICD-10-CM | POA: Diagnosis not present

## 2021-03-21 DIAGNOSIS — F32A Depression, unspecified: Secondary | ICD-10-CM

## 2021-03-21 DIAGNOSIS — I8393 Asymptomatic varicose veins of bilateral lower extremities: Secondary | ICD-10-CM

## 2021-03-21 DIAGNOSIS — F409 Phobic anxiety disorder, unspecified: Secondary | ICD-10-CM

## 2021-03-21 LAB — POCT URINALYSIS DIPSTICK
Bilirubin, UA: NEGATIVE
Blood, UA: NEGATIVE
Glucose, UA: NEGATIVE
Ketones, UA: NEGATIVE
Leukocytes, UA: NEGATIVE
Nitrite, UA: NEGATIVE
Protein, UA: NEGATIVE
Spec Grav, UA: 1.015 (ref 1.010–1.025)
Urobilinogen, UA: 0.2 E.U./dL
pH, UA: 6 (ref 5.0–8.0)

## 2021-03-21 NOTE — Patient Instructions (Signed)
Labs are stable except LDL cholesterol  which is slightly elevated. I think you had reaction to Shingrix vaccine. Continue current meds. Dr. Valeta Harms did sign papers for Trelegy assistance program several months ago. You may call that office regarding this inhaler.

## 2021-03-21 NOTE — Progress Notes (Signed)
? ? ? ?  Annual Wellness Visit ? ?  ? ?Patient: Terri Bridges, Female    DOB: Jan 17, 1934, 87 y.o.   MRN: [id number] ?Visit Date: 03/21/2021 ? ?Chief Complaint  ?Patient presents with  ? Medicare Wellness  ? Annual Exam  ? ?Subjective  ?  ?Terri Bridges is a 87 y.o. female who presents today for her Annual Wellness Visit. ? ?HPI she also presents for annual health maintenance exam and evaluation of medical issues. ? ? ?Social History  ? ?Social History Narrative  ? Originally from [facility].   ? Previously worked in Engineer, mining for [employer]   ? Has 2 dogs currently.   ? Remote exposure to parrots in her current home.  ?  No known mold exposure.   ? Remote travel to [place].  ?  Has 1 indoor plant.   ? Caffeine use: No soda  ? 2 cups coffee every morning  ? ? ?Patient Care Team: ?Elby Showers, MD as PCP - General (Internal Medicine) ? ?Review of Systems ? ? Objective  ?  ?Vitals: BP 120/80   Pulse 88   Ht 4' 11.25" (1.505 m)   Wt 151 lb (68.5 kg)   SpO2 99%   BMI 30.24 kg/m?  ? ?Physical Exam ? ? ?Most recent functional status assessment: ?In your present state of health, do you have any difficulty performing the following activities: 03/21/2021  ?Hearing? N  ?Vision? N  ?Difficulty concentrating or making decisions? N  ?Walking or climbing stairs? N  ?Dressing or bathing? N  ?Doing errands, shopping? N  ?Preparing Food and eating ? N  ?Using the Toilet? N  ?In the past six months, have you accidently leaked urine? N  ?Do you have problems with loss of bowel control? N  ?Managing your Medications? N  ?Managing your Finances? N  ?Housekeeping or managing your Housekeeping? N  ?Some recent data might be hidden  ? ?Most recent fall risk assessment: ?Fall Risk  03/21/2021  ?Falls in the past year? 1  ?Number falls in past yr: 0  ?Injury with Fall? 0  ?Risk for fall due to : History of fall(s)  ?Follow up Falls evaluation completed  ? ? Most recent depression screenings: ?PHQ 2/9 Scores 03/21/2021 03/18/2020  ?PHQ - 2 Score 4  0  ?PHQ- 9 Score 8 0  ? ?Most recent cognitive screening: ?6CIT Screen 03/21/2021  ?What Year? 0 points  ?What month? 0 points  ?What time? 0 points  ?Count back from 20 0 points  ?Months in reverse 0 points  ?Repeat phrase 0 points  ?Total Score 0  ? ? ? ? ? Assessment & Plan  ?  ? ?Annual wellness visit done today including the all of the following: ?Reviewed patient's Family Medical History ?Reviewed and updated list of patient's medical providers ?Assessment of cognitive impairment was done ?Assessed patient's functional ability ?Established a written schedule for health screening services ?Health Risk Assessent Completed and Reviewed ? ?Discussed health benefits of physical activity, and encouraged her to engage in regular exercise appropriate for her age and condition.  ?  ? ? ?  ? ?I, Elby Showers, MD, have reviewed all documentation for this visit. The documentation on 03/21/21 for the exam, diagnosis, procedures, and orders are all accurate and complete. ? ? ?Angus Seller, CMA  ? ?

## 2021-03-21 NOTE — Progress Notes (Unsigned)
° °  Subjective:    Patient ID: Terri Bridges, female    DOB: January 03, 1935, 87 y.o.   MRN: [id number]  HPI 87 year old Female with mixed obstructive and restrictive lung disease. Took Shingrix vaccine at local pharmacy on Friday March 10 and felt poorly for a couple of days. Also took flu vaccine at the same time.  Likely had a reaction to Shingrix vaccine.  Feeling better today.  Had PFTs in 2017.   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 She says it is not stressful and keeps her mind sharp. She gets her lot rent for free in exchange for doing this work.She feels safe there. Resides alone. Does not smoke- quit March 2011. Does not consume alcohol.  LDL cholesterol elevated 113. Was 99 when checked 6 months ago.  Hx hypothyroidism. Has not used inhalers in a year. Uses nebulizer with albuterol.  FHx: sister died in June 12, 2020 at age 69 due to age and debilitation.  SHx: divorced and resides alone.  Review of Systems     Objective:   Physical Exam BP 120/80 pulse 88 pulse ox 99% weight 151 pounds. BMI 30.24. Was fatigued this weekend after  Shingrix and flu vaccines. Skin: Warm and dry.  Has stasis changes on lower extremities.  No cervical adenopathy.  No thyromegaly or carotid bruits.  Chest is clear to auscultation.  Cardiac exam: Regular rate and rhythm 1/6 systolic ejection murmur.  Breasts are without masses.  Abdomen is soft nondistended without hepatosplenomegaly masses or tenderness.  Trace edema of both lower extremities.  Neuro intact without gross focal deficits.      Assessment & Plan:  COPD-apparently patient assistance form was filled out at pulmonary office for Trelegy and she will need to check with them about this medication.  She cannot afford it on her own and we could not get a good price with good Rx card.

## 2021-03-22 ENCOUNTER — Other Ambulatory Visit: Payer: Self-pay | Admitting: Internal Medicine

## 2021-04-07 ENCOUNTER — Encounter: Payer: Self-pay | Admitting: Pulmonary Disease

## 2021-04-07 ENCOUNTER — Ambulatory Visit: Payer: Medicare HMO | Admitting: Pulmonary Disease

## 2021-04-07 VITALS — BP 120/74 | HR 90 | Temp 97.8°F | Ht 59.25 in | Wt 149.8 lb

## 2021-04-07 DIAGNOSIS — K449 Diaphragmatic hernia without obstruction or gangrene: Secondary | ICD-10-CM

## 2021-04-07 DIAGNOSIS — M419 Scoliosis, unspecified: Secondary | ICD-10-CM | POA: Diagnosis not present

## 2021-04-07 DIAGNOSIS — J4541 Moderate persistent asthma with (acute) exacerbation: Secondary | ICD-10-CM

## 2021-04-07 DIAGNOSIS — J302 Other seasonal allergic rhinitis: Secondary | ICD-10-CM

## 2021-04-07 DIAGNOSIS — J4540 Moderate persistent asthma, uncomplicated: Secondary | ICD-10-CM | POA: Diagnosis not present

## 2021-04-07 DIAGNOSIS — R942 Abnormal results of pulmonary function studies: Secondary | ICD-10-CM | POA: Diagnosis not present

## 2021-04-07 MED ORDER — BREZTRI AEROSPHERE 160-9-4.8 MCG/ACT IN AERO: 2.0000 | INHALATION_SPRAY | Freq: Two times a day (BID) | RESPIRATORY_TRACT | 0 refills | Status: DC

## 2021-04-07 MED ORDER — PREDNISONE 10 MG PO TABS: ORAL_TABLET | ORAL | 0 refills | Status: DC

## 2021-04-07 NOTE — Addendum Note (Signed)
Addended by: Lorretta Harp on: 04/07/2021 04:34 PM ? ? Modules accepted: Orders ? ?

## 2021-04-07 NOTE — Progress Notes (Signed)
? ?Synopsis: Referred in Feb 2020 for chronic cough, former patient of Dr. Ashok Cordia, PCP: Elby Showers, MD ? ?Subjective:  ? ?PATIENT ID: Terri Bridges GENDER: female DOB: 04-13-1934, MRN: [id number] ? ?Chief Complaint  ?Patient presents with  ? Follow-up  ?  Pt states she has had problems with increased SOB and also has had an occasional cough. States she has used her nebulizer which she states does help her.  ? ? ?C/o today of ongoing bronchitis symptoms. She has had 4 sinus surgeries in the past. She stopped in march 1993 smoking, <1 ppd for 20+ years. The sinuses were an issue for sometime and she states that they were having trouble draining them. She has had ongoing cough for several years. She has hiatal hernia and GERD, she taking PPI, omeprazole. She had pfts in the past with no evidence of obstruction. Spring time seasonal allergies. She has hay fever symptoms, trouble with perfumes. She wheezes routinely at night time. Cold air and wind makes her DOE and SOB much worse. No eczema as a kid. No admissions to the hospital or visits to ER recently for respiratory complaints. Currently taking symbicort and albuterol nebs. She feels like she has to use her nebulizer every night. She has noticed that the albuterol makes her anxious and shaky. She currently uses it 3 times per day.   ? ?OV 10/24/2018: Patient seen today in follow-up for recurrent bronchitis type symptoms.  She has a 20+-pack-year history of smoking as stated above.  She does have seasonal allergies and trouble with perfumes and cold air.  Her symptoms right now are well controlled with just as needed albuterol nebulizer.  She is unable to afford the Symbicort.  She also seems to be very frustrated with her home equipment supplier.  Was also frustrated with the pharmacy.  I explained that we would not help her anyway we can with giving her samples of medications if she wanted them.  She states that she does not want to use an inhaler she is  doing fine with her nebulizer and I encouraged her to continue to do so.  PFTs again reviewed with the patient today in the office. ? ?OV 05/12/2020: Here today for follow up.  She does have ongoing complaints today of shortness of breath.  She has difficulty making it from her house to her car.  Last time she was seen by me was in October 2020.  She has had subsequent follow-up in the office with our APP's.  Seasonal allergies perfumes and cold continue to exacerbate.  She is relatively tethered to her albuterol nebulizer.  Unable to afford Symbicort in the past.  She is not currently on any maintenance inhalers.  She is confused as to the discussions that we had last time regarding her PFT results and the fact that she has kyphoscoliosis.  I explained what I meant by her last office visit and PFT results. ? ?OV 04/07/2021: Here today for follow-up.  She still has ongoing shortness of breath.  Was unable to afford Trelegy.  She has not been on any maintenance inhalers for several months.  She uses her albuterol frequently.  She uses her albuterol nebulizer frequently.  We reviewed her PFTs today. ? ? ?Past Medical History:  ?Diagnosis Date  ? Allergic rhinitis   ? Allergy   ? uses Flonase daily as needed  ? Anxiety   ? takes Xanax daily as needed  ? Arthritis   ? "back" (04/21/2013)  ?  Chronic lower back pain   ? Constipation   ? takes an OTC stool softener  ? Depression   ? takes Zoloft daily  ? Dry eyes   ? uses eye drops  ? Exertional shortness of breath   ? GERD (gastroesophageal reflux disease)   ? takes Omeprazole daily and Protonix daily as needed  ? H/O hiatal hernia   ? Headache(784.0)   ? sinus  ? Heart murmur   ? years ago  ? History of bronchitis   ? last time many yrs ago  ? Hypertension   ? takes Losartan and Cardizem daily  ? Hypothyroidism   ? takes Synthroid daily  ? Joint pain   ? Joint swelling   ? Nocturia   ? Osteoporosis   ? Pressure in chest 08/23/13  ? fell and started having chest pressure, will  have ECHO and stress test 09/04/13 before surgery  ? Ringing in ears   ? sees Dr.Byers for this  ? Scoliosis   ? Urinary frequency   ? Urinary incontinence   ? Urinary urgency   ?  ? ?Family History  ?Problem Relation Age of Onset  ? Heart failure Mother   ? Heart failure Father   ? Hypertension Sister   ? Neuropathy Neg Hx   ?  ? ?Past Surgical History:  ?Procedure Laterality Date  ? CARDIAC CATHETERIZATION  2005  ? CATARACT EXTRACTION W/ INTRAOCULAR LENS  IMPLANT, BILATERAL Bilateral   ? COLONOSCOPY    ? ESOPHAGOGASTRODUODENOSCOPY    ? EXCISIONAL TOTAL KNEE ARTHROPLASTY Left 09/08/2013  ? Procedure: EXCISIONAL TOTAL KNEE ARTHROPLASTY POLYEXCHANGE;  Surgeon: Vickey Huger, MD;  Location: [facility];  Service: Orthopedics;  Laterality: Left;  ? EYE SURGERY    ? JOINT REPLACEMENT    ? KNEE ARTHROSCOPY Right   ? NASAL SINUS SURGERY    ? x 4  ? SHOULDER ARTHROSCOPY W/ ROTATOR CUFF REPAIR Right   ? TONSILLECTOMY  1940's  ? TOTAL KNEE ARTHROPLASTY Left 04/21/2013  ? TOTAL KNEE ARTHROPLASTY Left 04/21/2013  ? Procedure: TOTAL KNEE ARTHROPLASTY;  Surgeon: Vickey Huger, MD;  Location: [facility];  Service: Orthopedics;  Laterality: Left;  ? TUBAL LIGATION    ? ? ?Social History  ? ?Socioeconomic History  ? Marital status: Single  ?  Spouse name: Not on file  ? Number of children: 6  ? Years of education: 57  ? Highest education level: Not on file  ?Occupational History  ? Occupation: Retired  ?  Employer: [employer]  ?  Comment: also caregiver  ?Tobacco Use  ? Smoking status: Former  ?  Packs/day: 1.00  ?  Years: 10.00  ?  Pack years: 10.00  ?  Types: Cigarettes  ?  Quit date: 03/30/1991  ?  Years since quitting: 30.0  ? Smokeless tobacco: Never  ? Tobacco comments:  ?  quit smoking in Mar 1993  ?Substance and Sexual Activity  ? Alcohol use: No  ?  Alcohol/week: 0.0 standard drinks  ? Drug use: No  ? Sexual activity: Never  ?  Birth control/protection: Post-menopausal  ?Other Topics Concern  ? Not on file  ?Social History Narrative  ? Originally  from [facility].   ? Previously worked in Engineer, mining for [employer]   ? Has 2 dogs currently.   ? Remote exposure to parrots in her current home.  ?  No known mold exposure.   ? Remote travel to [place].  ?  Has 1 indoor plant.   ?  Caffeine use: No soda  ? 2 cups coffee every morning  ? ?Social Determinants of Health  ? ?Financial Resource Strain: Not on file  ?Food Insecurity: Not on file  ?Transportation Needs: Not on file  ?Physical Activity: Not on file  ?Stress: Not on file  ?Social Connections: Not on file  ?Intimate Partner Violence: Not on file  ?  ? ?Allergies  ?Allergen Reactions  ? Doxycycline Rash  ?  ? ?Outpatient Medications Prior to Visit  ?Medication Sig Dispense Refill  ? albuterol (PROVENTIL) (2.5 MG/3ML) 0.083% nebulizer solution USE 1 VIAL IN NEBULIZER EVERY 6 HOURS AS NEEDED FOR WHEEZING OR SHORTNESS OF BREATH 225 mL 0  ? ALPRAZolam (XANAX) 0.5 MG tablet Take 1 tablet (0.5 mg total) by mouth at bedtime. 30 tablet 5  ? amLODipine (NORVASC) 5 MG tablet Take 1 tablet by mouth once daily 90 tablet 3  ? Calcium-Vitamin D (CALTRATE 600 PLUS-VIT D PO) Take 1 tablet by mouth daily.    ? furosemide (LASIX) 20 MG tablet Take 1 tablet (20 mg total) by mouth daily. 90 tablet 3  ? ibandronate (BONIVA) 150 MG tablet Take 1 tablet (150 mg total) by mouth every 30 (thirty) days. Take in the morning with a full glass of water, on an empty stomach, and do not take anything else by mouth or lie down for the next 30 min. 1 tablet 11  ? levothyroxine (SYNTHROID) 100 MCG tablet Take 1 tablet by mouth once daily 90 tablet 1  ? losartan (COZAAR) 100 MG tablet Take 1 tablet by mouth once daily 90 tablet 3  ? Omega-3 Fatty Acids (FISH OIL PO) Take 1 capsule by mouth daily.    ? omeprazole (PRILOSEC) 20 MG capsule Take 1 capsule by mouth once daily 90 capsule 3  ? albuterol (PROVENTIL HFA;VENTOLIN HFA) 108 (90 BASE) MCG/ACT inhaler Inhale 2 puffs into the lungs every 6 (six) hours as needed for wheezing or shortness of breath.  (Patient not taking: Reported on 03/21/2021) 1 Inhaler 3  ? budesonide-formoterol (SYMBICORT) 160-4.5 MCG/ACT inhaler Inhale 2 puffs into the lungs 3 times/day as needed-between meals & bedtime. (Patient not ta

## 2021-04-07 NOTE — Patient Instructions (Addendum)
Thank you for visiting Dr. Valeta Harms at [facility]. ?Today we recommend the following: ? ?Breztri samples  ?[place] application  ?Prednisone taper ordered and sent to pharmacy  ? ?Return in about 4 weeks (around 05/05/2021) for with APP or Dr. Valeta Harms. ? ? ? ?Please do your part to reduce the spread of COVID-19. ? ?

## 2021-04-13 ENCOUNTER — Other Ambulatory Visit: Payer: Self-pay | Admitting: Internal Medicine

## 2021-04-13 ENCOUNTER — Telehealth: Payer: Self-pay | Admitting: Pulmonary Disease

## 2021-04-13 NOTE — Telephone Encounter (Signed)
Breztri pt assistance faxed to [facility] and Me 04/07/21 and received fax confirmation. Sent forms to be scanned. ?

## 2021-04-14 NOTE — Telephone Encounter (Addendum)
Received a fax from  [place] regarding an approval for  [place]  patient assistance from 04/13/2021 to 01/08/2022. Approval letter sent to scan center. ? ?Received additional fax with same service date requesting updated application be sent in. A copy of the provider form that was sent in is attached--it appears that only 2 pages were sent at that time (cover sheet and provider form). Unsure if this fax was received prior to application submission or if pt portion is missing. ? ?Will place form in BI's box for f/u. If updated pt portion was not sent on 04/13/21 along with provider portion, then it will need to be filled out and faxed in as well. If pt portion has already been submitted for this year, then it can be disregarded and nothing additional is required. ?

## 2021-04-14 NOTE — Telephone Encounter (Signed)
Ronney Asters, do you know if the updated forms got faxed to Patient Assistance? Thanks! ?

## 2021-04-20 ENCOUNTER — Other Ambulatory Visit: Payer: Self-pay | Admitting: Internal Medicine

## 2021-05-09 ENCOUNTER — Encounter: Payer: Self-pay | Admitting: Pulmonary Disease

## 2021-05-09 ENCOUNTER — Ambulatory Visit: Payer: Medicare Other | Admitting: Pulmonary Disease

## 2021-05-09 VITALS — BP 112/70 | HR 89 | Temp 98.0°F | Ht 59.0 in | Wt 145.8 lb

## 2021-05-09 DIAGNOSIS — R942 Abnormal results of pulmonary function studies: Secondary | ICD-10-CM | POA: Diagnosis not present

## 2021-05-09 DIAGNOSIS — M419 Scoliosis, unspecified: Secondary | ICD-10-CM

## 2021-05-09 DIAGNOSIS — J302 Other seasonal allergic rhinitis: Secondary | ICD-10-CM

## 2021-05-09 DIAGNOSIS — J4540 Moderate persistent asthma, uncomplicated: Secondary | ICD-10-CM

## 2021-05-09 NOTE — Patient Instructions (Signed)
Thank you for visiting Dr. Valeta Harms at [facility]. ?Today we recommend the following: ? ?AZ&[place] Application, we need to follow up on the status of this ?Continue the breztri that you have.  ? ?Return in about 6 months (around 11/09/2021), or if symptoms worsen or fail to improve, for with APP or Dr. Valeta Harms. ? ? ? ?Please do your part to reduce the spread of COVID-19.  ? ?

## 2021-05-09 NOTE — Progress Notes (Signed)
? ?Synopsis: Referred in Feb 2020 for chronic cough, former patient of Dr. Ashok Cordia, PCP: Elby Showers, MD ? ?Subjective:  ? ?PATIENT ID: Terri Bridges Oh GENDER: female DOB: 14-Jul-1934, MRN: [id number] ? ?Chief Complaint  ?Patient presents with  ? Follow-up  ?  Follow up.   ? ? ?C/o today of ongoing bronchitis symptoms. She has had 4 sinus surgeries in the past. She stopped in march 1993 smoking, <1 ppd for 20+ years. The sinuses were an issue for sometime and she states that they were having trouble draining them. She has had ongoing cough for several years. She has hiatal hernia and GERD, she taking PPI, omeprazole. She had pfts in the past with no evidence of obstruction. Spring time seasonal allergies. She has hay fever symptoms, trouble with perfumes. She wheezes routinely at night time. Cold air and wind makes her DOE and SOB much worse. No eczema as a kid. No admissions to the hospital or visits to ER recently for respiratory complaints. Currently taking symbicort and albuterol nebs. She feels like she has to use her nebulizer every night. She has noticed that the albuterol makes her anxious and shaky. She currently uses it 3 times per day.   ? ?OV 10/24/2018: Patient seen today in follow-up for recurrent bronchitis type symptoms.  She has a 20+-pack-year history of smoking as stated above.  She does have seasonal allergies and trouble with perfumes and cold air.  Her symptoms right now are well controlled with just as needed albuterol nebulizer.  She is unable to afford the Symbicort.  She also seems to be very frustrated with her home equipment supplier.  Was also frustrated with the pharmacy.  I explained that we would not help her anyway we can with giving her samples of medications if she wanted them.  She states that she does not want to use an inhaler she is doing fine with her nebulizer and I encouraged her to continue to do so.  PFTs again reviewed with the patient today in the office. ? ?OV  05/12/2020: Here today for follow up.  She does have ongoing complaints today of shortness of breath.  She has difficulty making it from her house to her car.  Last time she was seen by me was in October 2020.  She has had subsequent follow-up in the office with our APP's.  Seasonal allergies perfumes and cold continue to exacerbate.  She is relatively tethered to her albuterol nebulizer.  Unable to afford Symbicort in the past.  She is not currently on any maintenance inhalers.  She is confused as to the discussions that we had last time regarding her PFT results and the fact that she has kyphoscoliosis.  I explained what I meant by her last office visit and PFT results. ? ?OV 04/07/2021: Here today for follow-up.  She still has ongoing shortness of breath.  Was unable to afford Trelegy.  She has not been on any maintenance inhalers for several months.  She uses her albuterol frequently.  She uses her albuterol nebulizer frequently.  We reviewed her PFTs today. ? ?OV 05/09/2021: Here today for follow-up.  Her shortness of breath is much improved.  She feels like she can walk her dog out to the mailbox without getting short of breath.  She has been using her Breztri inhaler.  She was unable to afford Trelegy.  We [place] application for [place] and [place].  Not really sure what the status of this application is or whether  or not she has been approved.  We will need to follow-up with [place] today. ? ? ?Past Medical History:  ?Diagnosis Date  ? Allergic rhinitis   ? Allergy   ? uses Flonase daily as needed  ? Anxiety   ? takes Xanax daily as needed  ? Arthritis   ? "back" (04/21/2013)  ? Chronic lower back pain   ? Constipation   ? takes an OTC stool softener  ? Depression   ? takes Zoloft daily  ? Dry eyes   ? uses eye drops  ? Exertional shortness of breath   ? GERD (gastroesophageal reflux disease)   ? takes Omeprazole daily and Protonix daily as needed  ? H/O hiatal hernia   ? Headache(784.0)   ? sinus  ? Heart murmur   ? years  ago  ? History of bronchitis   ? last time many yrs ago  ? Hypertension   ? takes Losartan and Cardizem daily  ? Hypothyroidism   ? takes Synthroid daily  ? Joint pain   ? Joint swelling   ? Nocturia   ? Osteoporosis   ? Pressure in chest 08/23/13  ? fell and started having chest pressure, will have ECHO and stress test 09/04/13 before surgery  ? Ringing in ears   ? sees Dr.Byers for this  ? Scoliosis   ? Urinary frequency   ? Urinary incontinence   ? Urinary urgency   ?  ? ?Family History  ?Problem Relation Age of Onset  ? Heart failure Mother   ? Heart failure Father   ? Hypertension Sister   ? Neuropathy Neg Hx   ?  ? ?Past Surgical History:  ?Procedure Laterality Date  ? CARDIAC CATHETERIZATION  2005  ? CATARACT EXTRACTION W/ INTRAOCULAR LENS  IMPLANT, BILATERAL Bilateral   ? COLONOSCOPY    ? ESOPHAGOGASTRODUODENOSCOPY    ? EXCISIONAL TOTAL KNEE ARTHROPLASTY Left 09/08/2013  ? Procedure: EXCISIONAL TOTAL KNEE ARTHROPLASTY POLYEXCHANGE;  Surgeon: Vickey Huger, MD;  Location: [facility];  Service: Orthopedics;  Laterality: Left;  ? EYE SURGERY    ? JOINT REPLACEMENT    ? KNEE ARTHROSCOPY Right   ? NASAL SINUS SURGERY    ? x 4  ? SHOULDER ARTHROSCOPY W/ ROTATOR CUFF REPAIR Right   ? TONSILLECTOMY  1940's  ? TOTAL KNEE ARTHROPLASTY Left 04/21/2013  ? TOTAL KNEE ARTHROPLASTY Left 04/21/2013  ? Procedure: TOTAL KNEE ARTHROPLASTY;  Surgeon: Vickey Huger, MD;  Location: [facility];  Service: Orthopedics;  Laterality: Left;  ? TUBAL LIGATION    ? ? ?Social History  ? ?Socioeconomic History  ? Marital status: Single  ?  Spouse name: Not on file  ? Number of children: 6  ? Years of education: 3  ? Highest education level: Not on file  ?Occupational History  ? Occupation: Retired  ?  Employer: [employer]  ?  Comment: also caregiver  ?Tobacco Use  ? Smoking status: Former  ?  Packs/day: 1.00  ?  Years: 10.00  ?  Pack years: 10.00  ?  Types: Cigarettes  ?  Quit date: 03/30/1991  ?  Years since quitting: 30.1  ? Smokeless tobacco: Never  ? Tobacco  comments:  ?  quit smoking in Mar 1993  ?Substance and Sexual Activity  ? Alcohol use: No  ?  Alcohol/week: 0.0 standard drinks  ? Drug use: No  ? Sexual activity: Never  ?  Birth control/protection: Post-menopausal  ?Other Topics Concern  ? Not on file  ?  Social History Narrative  ? Originally from [facility].   ? Previously worked in Engineer, mining for [employer]   ? Has 2 dogs currently.   ? Remote exposure to parrots in her current home.  ?  No known mold exposure.   ? Remote travel to [place].  ?  Has 1 indoor plant.   ? Caffeine use: No soda  ? 2 cups coffee every morning  ? ?Social Determinants of Health  ? ?Financial Resource Strain: Not on file  ?Food Insecurity: Not on file  ?Transportation Needs: Not on file  ?Physical Activity: Not on file  ?Stress: Not on file  ?Social Connections: Not on file  ?Intimate Partner Violence: Not on file  ?  ? ?Allergies  ?Allergen Reactions  ? Doxycycline Rash  ?  ? ?Outpatient Medications Prior to Visit  ?Medication Sig Dispense Refill  ? albuterol (PROVENTIL) (2.5 MG/3ML) 0.083% nebulizer solution USE 1 VIAL IN NEBULIZER EVERY 6 HOURS AS NEEDED FOR WHEEZING OR SHORTNESS OF BREATH 225 mL 0  ? ALPRAZolam (XANAX) 0.5 MG tablet TAKE 1 TABLET BY MOUTH AT BEDTIME 90 tablet 1  ? amLODipine (NORVASC) 5 MG tablet Take 1 tablet by mouth once daily 90 tablet 1  ? Budeson-Glycopyrrol-Formoterol (BREZTRI AEROSPHERE) 160-9-4.8 MCG/ACT AERO Inhale 2 puffs into the lungs in the morning and at bedtime. 5.9 g 0  ? Calcium-Vitamin D (CALTRATE 600 PLUS-VIT D PO) Take 1 tablet by mouth daily.    ? furosemide (LASIX) 20 MG tablet Take 1 tablet (20 mg total) by mouth daily. 90 tablet 3  ? ibandronate (BONIVA) 150 MG tablet Take 1 tablet (150 mg total) by mouth every 30 (thirty) days. Take in the morning with a full glass of water, on an empty stomach, and do not take anything else by mouth or lie down for the next 30 min. 1 tablet 11  ? levothyroxine (SYNTHROID) 100 MCG tablet Take 1 tablet by mouth once daily  90 tablet 1  ? losartan (COZAAR) 100 MG tablet Take 1 tablet by mouth once daily 90 tablet 3  ? Omega-3 Fatty Acids (FISH OIL PO) Take 1 capsule by mouth daily.    ? omeprazole (PRILOSEC) 20 MG capsule Take

## 2021-05-10 NOTE — Telephone Encounter (Signed)
Patient came to office and filled out [place] & Me forms. Forms placed in Dr. Fabio Bering box. ?

## 2021-05-12 ENCOUNTER — Other Ambulatory Visit: Payer: Self-pay | Admitting: Internal Medicine

## 2021-06-03 ENCOUNTER — Other Ambulatory Visit: Payer: Self-pay | Admitting: Internal Medicine

## 2021-06-11 ENCOUNTER — Other Ambulatory Visit: Payer: Self-pay | Admitting: Internal Medicine

## 2021-06-16 ENCOUNTER — Telehealth: Payer: Self-pay | Admitting: Pulmonary Disease

## 2021-06-16 NOTE — Telephone Encounter (Signed)
Terri Bridges, please advise on this for pt.

## 2021-06-20 ENCOUNTER — Other Ambulatory Visit: Payer: Self-pay | Admitting: Internal Medicine

## 2021-07-06 NOTE — Telephone Encounter (Signed)
Called and spoke with pt to see if she ever heard from pt assistance and she said she did receive shipment of meds from pt assistance. Nothing further needed.

## 2021-07-08 ENCOUNTER — Encounter: Payer: Self-pay | Admitting: Internal Medicine

## 2021-07-08 ENCOUNTER — Ambulatory Visit (INDEPENDENT_AMBULATORY_CARE_PROVIDER_SITE_OTHER): Payer: Medicare Other | Admitting: Internal Medicine

## 2021-07-08 ENCOUNTER — Ambulatory Visit
Admission: RE | Admit: 2021-07-08 | Discharge: 2021-07-08 | Disposition: A | Discharge status: Home / Self Care | Payer: Medicare Other | Source: Ambulatory Visit | Attending: Internal Medicine | Admitting: Internal Medicine

## 2021-07-08 ENCOUNTER — Telehealth: Payer: Self-pay | Admitting: Internal Medicine

## 2021-07-08 DIAGNOSIS — Z8709 Personal history of other diseases of the respiratory system: Secondary | ICD-10-CM

## 2021-07-08 DIAGNOSIS — N1831 Chronic kidney disease, stage 3a: Secondary | ICD-10-CM | POA: Diagnosis not present

## 2021-07-08 DIAGNOSIS — R609 Edema, unspecified: Secondary | ICD-10-CM | POA: Diagnosis not present

## 2021-07-08 DIAGNOSIS — F32A Depression, unspecified: Secondary | ICD-10-CM

## 2021-07-08 DIAGNOSIS — I10 Essential (primary) hypertension: Secondary | ICD-10-CM

## 2021-07-08 DIAGNOSIS — R600 Localized edema: Secondary | ICD-10-CM | POA: Diagnosis not present

## 2021-07-08 DIAGNOSIS — F419 Anxiety disorder, unspecified: Secondary | ICD-10-CM

## 2021-07-08 DIAGNOSIS — E039 Hypothyroidism, unspecified: Secondary | ICD-10-CM

## 2021-07-08 DIAGNOSIS — R0602 Shortness of breath: Secondary | ICD-10-CM

## 2021-07-08 MED ORDER — CEFTRIAXONE SODIUM 1 G IJ SOLR
1.0000 g | Freq: Once | INTRAMUSCULAR | Status: AC
  Administered 2021-07-08: 1 g via INTRAMUSCULAR

## 2021-07-08 MED ORDER — ALBUTEROL SULFATE (2.5 MG/3ML) 0.083% IN NEBU: INHALATION_SOLUTION | RESPIRATORY_TRACT | 99 refills | Status: DC

## 2021-07-08 MED ORDER — PREDNISONE 10 MG PO TABS: ORAL_TABLET | ORAL | 0 refills | Status: DC

## 2021-07-08 MED ORDER — LEVOFLOXACIN 500 MG PO TABS: 500.0000 mg | ORAL_TABLET | Freq: Every day | ORAL | 0 refills | Status: DC

## 2021-07-08 MED ORDER — FUROSEMIDE 20 MG PO TABS: 20.0000 mg | ORAL_TABLET | Freq: Every day | ORAL | 5 refills | Status: DC

## 2021-07-08 NOTE — Telephone Encounter (Signed)
Called patient and she is coming now

## 2021-07-08 NOTE — Telephone Encounter (Signed)
Terri Bridges [phone]  Terri Bridges called to say for the last 2 weeks she has been exhausted, weak and getting SOB, when walking a distance like the length of our hall, she did a breathing treatment last night but does not help much. She would like for you to see her, or should she go to urgent care.

## 2021-07-08 NOTE — Telephone Encounter (Addendum)
Phone call to patient with CXR results. Has bilateral lung opacities. Has been prescribed steroids, antibiotics and Lasix. Has home nebulizer treatment. Needs follow up in one week. My office will be closed next week until Thursday. If symptoms worsen, she is to go to ED.  Tried to call Contact listed: Osborne Casco. Voice mail not set up and could not leave a message. Will try Contact listed:Patty Mariea Clonts.  MJB

## 2021-07-08 NOTE — Telephone Encounter (Signed)
Have left voice mail message for Terri Bridges regarding patient's evaluation today and need for follow up. If sxs worsen over the next few days, patient needs to go to ED for evaluation. See above note left for [employer] as well. MJB, MD

## 2021-07-08 NOTE — Progress Notes (Signed)
   Subjective:    Patient ID: Terri Bridges, female    DOB: 24-Jan-1934, 87 y.o.   MRN: [id number]  HPI 87 year old Female just received Breztri samples in the mail and just started using this medication.  History of mixed obstructive and restrictive ventilatory defect seen by Dr. Valeta Harms.  Has albuterol nebulizer solution and has been using that frequently.  Has been in her house trailer unable to go out and ambulate due to shortness of breath and generalized weakness.  Apparently this has been going on for a number of days.  She called this afternoon asking to be seen.  She was seen by Pulmonary in early May.  Has had worsening of COPD symptoms for a number of days and has not sought treatment until this afternoon.  Labs will be drawn today and are pending.  Drew CBC with differential and C-met.  She has home nebulizer and needs refill on nebulizer solution.  This was sent in.  Appetite decreased. Has noticed LE swelling.  Review of Systems  she has been reluctant to contact her family about her symptoms. Denies chest pain, vomiting, diaphoresis.     Objective:   Physical Exam   RR 12   Looks fatigued and mildly SOB when speaking. No audible wheezing. Skin warm and dry. Neck is supple. Chest decreased BS lower lobes bilaterally. 1+ pitting edema ankles bilaterally. Pt says this was worse earlier this week.  CXR shows bilateral opacities consistent with a combination of atelectasis and probable trace effusions.  Mild cardiomegaly.  Moderate sized hiatal hernia.  No pneumothorax.  No evidence of mediastinal or hilar masses.     Assessment & Plan:  Suspect combination of COPD flareup and congestive heart failure.  Also possible pneumonia.  Plan: Patient advised to take Lasix 20 mg daily.  Was given Levaquin 500 mg daily for 10 days.  Use albuterol nebulizer 4 times daily.  Continue Breztri inhaler.  Continue antihypertensive medication and Lasix 20 mg daily.  Take thyroid replacement  medications.  Follow-up next Thursday, July 6.  If symptoms worsen in the interim patient is to go directly to the emergency department.  I have left voicemail message for her to relatives that her contacts in her chart regarding this evaluation.  Lab results pending.  She was given ceftriaxone 1 g IM today for possible pneumonia.  Labs pending.  Called patient with results of chest x-ray.  Advised her to go to emergency room if symptoms worsen.

## 2021-07-08 NOTE — Patient Instructions (Addendum)
Please have chest x-ray immediately at [facility]  Take prednisone in tapering course as directed.  You have been given 1 g IM Rocephin in office today and started on Levaquin 500 mg daily for 10 days.  Take Lasix (furosemide) 20 mg daily for leg swelling. Stay well-hydrated.  If symptoms worsen please present to the Emergency Department as our office will be closed several days next week for 4 July holiday.  I will call you with lab results as soon as we receive them. You will need follow up here next week.

## 2021-07-09 LAB — CBC WITH DIFFERENTIAL/PLATELET
Absolute Monocytes: 381 cells/uL (ref 200–950)
Basophils Absolute: 39 cells/uL (ref 0–200)
Basophils Relative: 0.7 %
Eosinophils Absolute: 358 cells/uL (ref 15–500)
Eosinophils Relative: 6.4 %
HCT: 39.6 % (ref 35.0–45.0)
Hemoglobin: 12.7 g/dL (ref 11.7–15.5)
Lymphs Abs: 1221 cells/uL (ref 850–3900)
MCH: 26.8 pg — ABNORMAL LOW (ref 27.0–33.0)
MCHC: 32.1 g/dL (ref 32.0–36.0)
MCV: 83.5 fL (ref 80.0–100.0)
MPV: 11.4 fL (ref 7.5–12.5)
Monocytes Relative: 6.8 %
Neutro Abs: 3601 cells/uL (ref 1500–7800)
Neutrophils Relative %: 64.3 %
Platelets: 172 10*3/uL (ref 140–400)
RBC: 4.74 10*6/uL (ref 3.80–5.10)
RDW: 13.9 % (ref 11.0–15.0)
Total Lymphocyte: 21.8 %
WBC: 5.6 10*3/uL (ref 3.8–10.8)

## 2021-07-09 LAB — COMPLETE METABOLIC PANEL WITH GFR
AG Ratio: 1.8 (calc) (ref 1.0–2.5)
ALT: 27 U/L (ref 6–29)
AST: 31 U/L (ref 10–35)
Albumin: 4.1 g/dL (ref 3.6–5.1)
Alkaline phosphatase (APISO): 59 U/L (ref 37–153)
BUN/Creatinine Ratio: 18 (calc) (ref 6–22)
BUN: 26 mg/dL — ABNORMAL HIGH (ref 7–25)
CO2: 27 mmol/L (ref 20–32)
Calcium: 9.4 mg/dL (ref 8.6–10.4)
Chloride: 103 mmol/L (ref 98–110)
Creat: 1.43 mg/dL — ABNORMAL HIGH (ref 0.60–0.95)
Globulin: 2.3 g/dL (calc) (ref 1.9–3.7)
Glucose, Bld: 96 mg/dL (ref 65–99)
Potassium: 4.1 mmol/L (ref 3.5–5.3)
Sodium: 141 mmol/L (ref 135–146)
Total Bilirubin: 1 mg/dL (ref 0.2–1.2)
Total Protein: 6.4 g/dL (ref 6.1–8.1)
eGFR: 36 mL/min/{1.73_m2} — ABNORMAL LOW (ref >=60)

## 2021-07-15 ENCOUNTER — Encounter: Payer: Self-pay | Admitting: Internal Medicine

## 2021-07-15 ENCOUNTER — Ambulatory Visit (INDEPENDENT_AMBULATORY_CARE_PROVIDER_SITE_OTHER): Payer: Medicare Other | Admitting: Internal Medicine

## 2021-07-15 VITALS — BP 110/74 | HR 95

## 2021-07-15 DIAGNOSIS — I10 Essential (primary) hypertension: Secondary | ICD-10-CM | POA: Diagnosis not present

## 2021-07-15 DIAGNOSIS — R0602 Shortness of breath: Secondary | ICD-10-CM | POA: Diagnosis not present

## 2021-07-15 DIAGNOSIS — I5023 Acute on chronic systolic (congestive) heart failure: Secondary | ICD-10-CM | POA: Diagnosis not present

## 2021-07-15 DIAGNOSIS — R600 Localized edema: Secondary | ICD-10-CM

## 2021-07-15 DIAGNOSIS — I517 Cardiomegaly: Secondary | ICD-10-CM

## 2021-07-15 DIAGNOSIS — Z8709 Personal history of other diseases of the respiratory system: Secondary | ICD-10-CM

## 2021-07-15 DIAGNOSIS — E039 Hypothyroidism, unspecified: Secondary | ICD-10-CM

## 2021-07-15 DIAGNOSIS — F32A Depression, unspecified: Secondary | ICD-10-CM | POA: Diagnosis not present

## 2021-07-15 DIAGNOSIS — Z602 Problems related to living alone: Secondary | ICD-10-CM

## 2021-07-15 DIAGNOSIS — N1831 Chronic kidney disease, stage 3a: Secondary | ICD-10-CM | POA: Diagnosis not present

## 2021-07-15 DIAGNOSIS — F419 Anxiety disorder, unspecified: Secondary | ICD-10-CM

## 2021-07-15 NOTE — Progress Notes (Signed)
   Subjective:    Patient ID: Terri Bridges, female    DOB: [birth year], 87 y.o.   MRN: [id number]  HPI 87 year old female seen on June 30 with an exacerbation of COPD with shortness of breath fatigue lethargy and lower extremity edema.  Chest x-ray was obtained on that day showing mild lung base opacities consistent with a combination of atelectasis and probable trace effusions.  Had mild cardiomegaly and a moderate-sized hiatal hernia.  Her hemoglobin that day was 12.7 g and white count was 5600.  Her creatinine was elevated at 1.43 and had been 1.35 in March.  She was treated with a tapering course of prednisone, given refill on albuterol nebulizer solution, given 1 g IM Rocephin in office and started on Levaquin 500 mg daily for 10 days.  She was told to continue with Lasix 20 mg daily.  I contacted her family and ask that one of them come with her on appointment today.  Her daughter, Terri Bridges is with her today.  She was to continue with Breztri inhaler at that Pulmonology was able to obtain for her.  She says she has been using this.  Today, she is much more alert and seems to be more energetic.  We had discussion today, her daughter Terri Bridges, myself and patient regarding her living alone in a house trailer.  She does not have a Life Alert device and should get one.  Family should check in with her frequently.  Review of Systems she is less short of breath and more energetic.     Objective:   Physical Exam Vital signs reviewed.  Blood pressure 110/74 pulse 95 pulse oximetry 95% on room air.  Previous pulse oximetry in March 2023 was 99% on room air.  Pulse oximetry of pulmonary was 96% in May on room air. Skin: Warm and dry.  No cervical adenopathy or carotid bruits.  Chest is clear.  Cardiac exam: Regular rate and rhythm with occasional extrasystole.  EKG was performed showing sinus rhythm with first-degree AV block and PVCs.  Also left bundle branch block and left atrial enlargement.  She has  1+ pitting edema of the lower extremities but that has improved from last visit. BNP and VMAT are pending today.     Assessment & Plan:  COPD-she looks much better than she did at last visit but I am still concerned she has an element of congestive heart failure related to COPD.  She will be referred to Cardiology.  She is on Breztri and albuterol.  Osteoporosis treated with Boniva.  Hypothyroidism treated with levothyroxine 100 mcg daily.  GE reflux treated with Prilosec 20 mg daily.  First-degree AV block-not new and was present on EKG in 2021  PVCs-new finding  Left bundle branch block-new  Chronic kidney disease stage IIIa with basic metabolic panel showing creatinine 1.43 in late June.  Hypertension-stable.  Currently on amlodipine 5 mg daily which could possibly contribute to edema.  Currently on Lasix 20 mg daily.  She is also on losartan 100 mg daily at the present time.  Plan: Referral to [facility].  She has follow-up appointment here in September.

## 2021-07-15 NOTE — Patient Instructions (Addendum)
Cardiology referral will be made to assess LV function in association with COPD. Does have some PVCs. Finish course of steroids and Levaquin. Please get Life Alert device. Use inhalers as directed. Take Lasix 20 mg daily.  Basic metabolic panel and BNP are pending today with further instructions to follow.  She has regular follow-up appointment here scheduled for September.

## 2021-07-16 LAB — BASIC METABOLIC PANEL
BUN/Creatinine Ratio: 18 (calc) (ref 6–22)
BUN: 29 mg/dL — ABNORMAL HIGH (ref 7–25)
CO2: 26 mmol/L (ref 20–32)
Calcium: 9.8 mg/dL (ref 8.6–10.4)
Chloride: 103 mmol/L (ref 98–110)
Creat: 1.64 mg/dL — ABNORMAL HIGH (ref 0.60–0.95)
Glucose, Bld: 82 mg/dL (ref 65–99)
Potassium: 4.7 mmol/L (ref 3.5–5.3)
Sodium: 140 mmol/L (ref 135–146)

## 2021-07-16 LAB — BRAIN NATRIURETIC PEPTIDE: Brain Natriuretic Peptide: 853 pg/mL — ABNORMAL HIGH (ref <100)

## 2021-07-21 ENCOUNTER — Telehealth: Payer: Self-pay

## 2021-07-21 NOTE — Telephone Encounter (Signed)
Patient has an appointment with cardiology in the morning at 9 am.

## 2021-07-21 NOTE — Progress Notes (Signed)
Cardiology Office Note:    Date:  07/22/2021   ID:  Terri Bridges, DOB 09-Mar-1934, MRN [id number]  PCP:  Elby Showers, MD   [facility] Providers Cardiologist:  Lenna Sciara, MD Referring MD: Elby Showers, MD   Chief Complaint/Reason for Referral: Dyspnea and bifascicular block  ASSESSMENT:    1. Fatigue, unspecified type   2. Primary hypertension   3. Aortic atherosclerosis ([place])   4. Hyperlipidemia, unspecified hyperlipidemia type   5. Bifascicular block   6. Stage 3 chronic kidney disease, unspecified whether stage 3a or 3b CKD ([place])     PLAN:    In order of problems listed above: 1.  Fatigue: Her blood pressure today is relatively low and she looks dry on exam.  I will have her hold her Lasix for 3 days to see if this helps.  We will obtain echocardiogram to evaluate her LV function.  I will obtain a 30-day monitor given her bifascicular block to evaluate further.  Follow-up in 6 weeks or earlier if needed 2.  Hypertension: Continue losartan and amlodipine. 3.  Aortic atherosclerosis: Start aspirin and consider atorvastatin in the future.  However given her advanced age do not think strict lipid control is necessary.   4.  Hyperlipidemia: As evidenced by the presence of aortic atherosclerosis her LDL by definition should be less than 70 however in this patient with advanced age I do not think this is necessarily needed. 5.  Bifascicular block: We will obtain a 30-day monitor to evaluate for bradycardia arrhythmias 6.  Chronic kidney disease stage III: Continue losartan for renal protection.             Dispo:  No follow-ups on file.      Medication Adjustments/Labs and Tests Ordered: Current medicines are reviewed at length with the patient today.  Concerns regarding medicines are outlined above.  The following changes have been made:     Labs/tests ordered: Orders Placed This Encounter  Procedures   CARDIAC EVENT MONITOR   ECHOCARDIOGRAM COMPLETE     Medication Changes: Meds ordered this encounter  Medications   aspirin EC 81 MG tablet    Sig: Take 1 tablet (81 mg total) by mouth daily. Swallow whole.    Dispense:  90 tablet    Refill:  3     Current medicines are reviewed at length with the patient today.  The patient does not have concerns regarding medicines.   History of Present Illness:    FOCUSED PROBLEM LIST:   1.  Aortic atherosclerosis on CT scan 2019 2.  Hypothyroidism 3.  Hyperlipidemia 4.  Hypertension 5.  Bifascicular block with first-degree and left bundle branch block 6.  Bronchitis followed by pulmonology 7.  Chronic kidney disease stage III  The patient is a 87 y.o. female with the indicated medical history here for recommendations regarding dyspnea and new bifascicular block.  Patient was seen by her primary care provider recently.  She was being managed for shortness of breath.  EKG demonstrated a new bifascicular block.  A BNP was ordered and found to be elevated.  She is referred for further recommendations.  The patient's biggest complaint today is fatigue.  She specifically denies shortness of breath.  She just feels that over the last 3 weeks she has no energy.  She denies any presyncope, syncope, or chest pain.  She has had stable two-pillow orthopnea for several years.  She did note some increased swelling which is improved after she  elevated her feet.  She has not required any emergency room visits or hospitalizations recently.    Current Medications: Current Meds  Medication Sig   ALPRAZolam (XANAX) 0.5 MG tablet TAKE 1 TABLET BY MOUTH AT BEDTIME   amLODipine (NORVASC) 5 MG tablet Take 1 tablet by mouth once daily   aspirin EC 81 MG tablet Take 1 tablet (81 mg total) by mouth daily. Swallow whole.   budesonide-formoterol (SYMBICORT) 160-4.5 MCG/ACT inhaler Inhale 2 puffs into the lungs 3 times/day as needed-between meals & bedtime.   Calcium-Vitamin D (CALTRATE 600 PLUS-VIT D PO) Take 1  tablet by mouth daily.   furosemide (LASIX) 20 MG tablet Take 1 tablet (20 mg total) by mouth daily.   ibandronate (BONIVA) 150 MG tablet Take 1 tablet (150 mg total) by mouth every 30 (thirty) days. Take in the morning with a full glass of water, on an empty stomach, and do not take anything else by mouth or lie down for the next 30 min.   levothyroxine (SYNTHROID) 100 MCG tablet Take 1 tablet by mouth once daily   losartan (COZAAR) 100 MG tablet Take 1 tablet by mouth once daily   Omega-3 Fatty Acids (FISH OIL PO) Take 1 capsule by mouth daily.   omeprazole (PRILOSEC) 20 MG capsule Take 1 capsule by mouth once daily     Allergies:    Doxycycline   Social History:   Social History   Tobacco Use   Smoking status: Former    Packs/day: 1.00    Years: 10.00    Total pack years: 10.00    Types: Cigarettes    Quit date: 03/30/1991    Years since quitting: 30.3   Smokeless tobacco: Never   Tobacco comments:    quit smoking in Mar 1993  Substance Use Topics   Alcohol use: No    Alcohol/week: 0.0 standard drinks of alcohol   Drug use: No     Family Hx: Family History  Problem Relation Age of Onset   Heart failure Mother    Heart failure Father    Hypertension Sister    Neuropathy Neg Hx      Review of Systems:   Please see the history of present illness.    All other systems reviewed and are negative.     EKGs/Labs/Other Test Reviewed:    EKG:  EKG performed July 2023 that I personally reviewed demonstrates sinus rhythm with first-degree AV block and left bundle branch block.  Prior CV studies:  TTE 2015 with ejection fraction of 55 to 60% without significant valvular abnormalities  Other studies Reviewed: Review of the additional studies/records demonstrates: CT abdomen pelvis 2019 with aortoiliac atherosclerosis  Recent Labs: 03/17/2021: TSH 1.59 07/08/2021: ALT 27; Hemoglobin 12.7; Platelets 172 07/15/2021: Brain Natriuretic Peptide 853; BUN 29; Creat 1.64; Potassium  4.7; Sodium 140   Recent Lipid Panel Lab Results  Component Value Date/Time   CHOL 189 03/17/2021 09:35 AM   TRIG 70 03/17/2021 09:35 AM   HDL 60 03/17/2021 09:35 AM   LDLCALC 113 (H) 03/17/2021 09:35 AM    Risk Assessment/Calculations:           Physical Exam:    VS:  BP 102/60   Pulse 72   Ht '4\' 11"'$  (1.499 m)   Wt 140 lb 12.8 oz (63.9 kg)   SpO2 96%   BMI 28.44 kg/m    Wt Readings from Last 3 Encounters:  07/22/21 140 lb 12.8 oz (63.9 kg)  05/09/21 145 lb  12.8 oz (66.1 kg)  04/07/21 149 lb 12.8 oz (67.9 kg)    GENERAL:  No apparent distress, AOx3 HEENT:  No carotid bruits, +2 carotid impulses, no scleral icterus; dry mucous membranes CAR: RRR with ectopy no murmurs, gallops, rubs, or thrills RES:  Clear to auscultation bilaterally ABD:  Soft, nontender, nondistended, positive bowel sounds x 4 VASC:  +2 radial pulses, +2 carotid pulses, palpable pedal pulses NEURO:  CN 2-12 grossly intact; motor and sensory grossly intact PSYCH:  No active depression or anxiety EXT:  No edema, ecchymosis, or cyanosis  Signed, Early Osmond, MD  07/22/2021 9:57 AM    [address], [place],   [address] Phone: [phone]; Fax: [phone]   Note:  This document was prepared using Dragon voice recognition software and may include unintentional dictation errors.

## 2021-07-22 ENCOUNTER — Ambulatory Visit: Payer: Medicare Other | Admitting: Internal Medicine

## 2021-07-22 ENCOUNTER — Encounter: Payer: Self-pay | Admitting: Internal Medicine

## 2021-07-22 VITALS — BP 102/60 | HR 72 | Ht 59.0 in | Wt 140.8 lb

## 2021-07-22 DIAGNOSIS — I452 Bifascicular block: Secondary | ICD-10-CM | POA: Diagnosis not present

## 2021-07-22 DIAGNOSIS — R5383 Other fatigue: Secondary | ICD-10-CM | POA: Diagnosis not present

## 2021-07-22 DIAGNOSIS — I10 Essential (primary) hypertension: Secondary | ICD-10-CM | POA: Diagnosis not present

## 2021-07-22 DIAGNOSIS — E785 Hyperlipidemia, unspecified: Secondary | ICD-10-CM

## 2021-07-22 DIAGNOSIS — I700 Atherosclerosis of aorta: Secondary | ICD-10-CM | POA: Diagnosis not present

## 2021-07-22 DIAGNOSIS — N183 Chronic kidney disease, stage 3 unspecified: Secondary | ICD-10-CM | POA: Diagnosis not present

## 2021-07-22 DIAGNOSIS — R0600 Dyspnea, unspecified: Secondary | ICD-10-CM

## 2021-07-22 MED ORDER — ASPIRIN 81 MG PO TBEC: 81.0000 mg | DELAYED_RELEASE_TABLET | Freq: Every day | ORAL | 3 refills | Status: AC

## 2021-07-22 NOTE — Patient Instructions (Addendum)
Medication Instructions:  Your physician has recommended you make the following change in your medication:  1-START Aspirin 81 mg by mouth daily. 2-STOP Furosemide for 3 days  *If you need a refill on your cardiac medications before your next appointment, please call your pharmacy*  Lab Work: If you have labs (blood work) drawn today and your tests are completely normal, you will receive your results only by: [place] (if you have MyChart) OR A paper copy in the mail If you have any lab test that is abnormal or we need to change your treatment, we will call you to review the results.  Testing/Procedures: Your physician has recommended that you wear an event monitor for 30 days. Event monitors are medical devices that record the heart's electrical activity. Doctors most often [place] these monitors to diagnose arrhythmias. Arrhythmias are problems with the speed or rhythm of the heartbeat. The monitor is a small, portable device. You can wear one while you do your normal daily activities. This is usually used to diagnose what is causing palpitations/syncope (passing out).  Your physician has requested that you have an echocardiogram. Echocardiography is a painless test that uses sound waves to create images of your heart. It provides your doctor with information about the size and shape of your heart and how well your heart's chambers and valves are working. This procedure takes approximately one hour. There are no restrictions for this procedure.  Follow-Up: At [facility], you and your health needs are our priority.  As part of our continuing mission to provide you with exceptional heart care, we have created designated Provider Care Teams.  These Care Teams include your primary Cardiologist (physician) and Advanced Practice Providers (APPs -  Physician Assistants and Nurse Practitioners) who all work together to provide you with the care you need, when you need it.  We recommend signing  up for the patient portal called "MyChart".  Sign up information is provided on this After Visit Summary.  MyChart is used to connect with patients for Virtual Visits (Telemedicine).  Patients are able to view lab/test results, encounter notes, upcoming appointments, etc.  Non-urgent messages can be sent to your provider as well.   To learn more about what you can do with MyChart, go to [email or web address].    Your next appointment:   6 weeks  The format for your next appointment:   In Person  Provider:   Early Osmond, MD {   Important Information About Sugar

## 2021-07-25 ENCOUNTER — Other Ambulatory Visit: Payer: Self-pay | Admitting: Internal Medicine

## 2021-07-25 ENCOUNTER — Ambulatory Visit (INDEPENDENT_AMBULATORY_CARE_PROVIDER_SITE_OTHER): Payer: Medicare Other

## 2021-07-25 DIAGNOSIS — R5383 Other fatigue: Secondary | ICD-10-CM

## 2021-07-25 DIAGNOSIS — I10 Essential (primary) hypertension: Secondary | ICD-10-CM

## 2021-07-25 DIAGNOSIS — I452 Bifascicular block: Secondary | ICD-10-CM

## 2021-07-25 DIAGNOSIS — R42 Dizziness and giddiness: Secondary | ICD-10-CM

## 2021-07-25 DIAGNOSIS — I700 Atherosclerosis of aorta: Secondary | ICD-10-CM

## 2021-07-25 DIAGNOSIS — I480 Paroxysmal atrial fibrillation: Secondary | ICD-10-CM | POA: Diagnosis not present

## 2021-07-25 DIAGNOSIS — E785 Hyperlipidemia, unspecified: Secondary | ICD-10-CM

## 2021-07-25 DIAGNOSIS — N183 Chronic kidney disease, stage 3 unspecified: Secondary | ICD-10-CM

## 2021-07-25 NOTE — Progress Notes (Unsigned)
[employer] event monitor serial # [id number] from office inventory applied to patient.

## 2021-08-03 ENCOUNTER — Ambulatory Visit (HOSPITAL_COMMUNITY): Payer: Medicare Other | Attending: Cardiology

## 2021-08-03 DIAGNOSIS — N183 Chronic kidney disease, stage 3 unspecified: Secondary | ICD-10-CM

## 2021-08-03 DIAGNOSIS — I700 Atherosclerosis of aorta: Secondary | ICD-10-CM

## 2021-08-03 DIAGNOSIS — I452 Bifascicular block: Secondary | ICD-10-CM

## 2021-08-03 DIAGNOSIS — R5383 Other fatigue: Secondary | ICD-10-CM

## 2021-08-03 DIAGNOSIS — E785 Hyperlipidemia, unspecified: Secondary | ICD-10-CM | POA: Insufficient documentation

## 2021-08-03 DIAGNOSIS — I10 Essential (primary) hypertension: Secondary | ICD-10-CM | POA: Insufficient documentation

## 2021-08-03 LAB — ECHOCARDIOGRAM COMPLETE
Area-P 1/2: 5.68 cm2
MV M vel: 4.93 m/s
MV Peak grad: 97.3 mmHg
Radius: 0.8 cm
S' Lateral: 5 cm

## 2021-08-08 ENCOUNTER — Telehealth: Payer: Self-pay

## 2021-08-08 NOTE — Telephone Encounter (Signed)
Pt called this morning regarding [employer] monitor report received 08/07/21 at 8:18 pm;  Pt stated she was asymptomatic, and received a "low battery" warning alert.  She initially had difficulties with connection, and working through problem.  A Tech called her regarding the monitor report; and Pt told Tech NO 911 needed that it was a battery concern.  The Tech helped Pt correct / fix problem.    Pt stated battery now reading at 93%, and will be wearing until 08/23/21.   Dr. Linard Millers DOD made aware of battery concern, and that Pt was contacted / and asymptomatic.  No further f/u req.

## 2021-08-22 ENCOUNTER — Ambulatory Visit (HOSPITAL_COMMUNITY)
Admission: EM | Admit: 2021-08-22 | Discharge: 2021-08-22 | Disposition: A | Discharge status: Home / Self Care | Payer: Medicare Other | Attending: Emergency Medicine | Admitting: Emergency Medicine

## 2021-08-22 ENCOUNTER — Encounter (HOSPITAL_COMMUNITY): Payer: Self-pay | Admitting: Emergency Medicine

## 2021-08-22 DIAGNOSIS — L089 Local infection of the skin and subcutaneous tissue, unspecified: Secondary | ICD-10-CM | POA: Diagnosis not present

## 2021-08-22 DIAGNOSIS — S51011A Laceration without foreign body of right elbow, initial encounter: Secondary | ICD-10-CM

## 2021-08-22 DIAGNOSIS — S80812A Abrasion, left lower leg, initial encounter: Secondary | ICD-10-CM | POA: Diagnosis not present

## 2021-08-22 MED ORDER — CEPHALEXIN 500 MG PO CAPS: 500.0000 mg | ORAL_CAPSULE | Freq: Two times a day (BID) | ORAL | 0 refills | Status: AC

## 2021-08-22 NOTE — Discharge Instructions (Signed)
Skin has been put back together with clear, do not allow area to become wet with soap and water for the until tomorrow morning  Skin glue will fall off on its own  May cleanse site on elbow and area on lip with antiseptic cleanser that you already have at home, pat dry and cover with Band-Aid if at risk for becoming contaminated  Take Keflex every morning and every evening for the next 5 days to treat lower leg for infection and keep elbow from becoming infected  May elevate lower extremity to help reduce swelling  May follow-up with primary doctor or urgent care for reevaluation as needed

## 2021-08-22 NOTE — ED Triage Notes (Signed)
Pt reports that hit right elbow on door at church last night causing skin tear. Unable to wrap up herself.  Pt c/o area on LLE that hit a week ago and been taking care of at home but having some yellowish drainage and swelling to area.

## 2021-08-22 NOTE — ED Provider Notes (Addendum)
[facility]    CSN: [id number] Arrival date & time: 08/22/21  1542      History   Chief Complaint Chief Complaint  Patient presents with   skin tear   Wound Check    HPI Terri Bridges is a 87 y.o. female.   Patient presents with skin tear to the right elbow beginning 1 day ago after bumping her arm against a chair at church.  Endorses difficulty cleansing and covering area as she is right-handed.  Range of motion of the arm is intact.  Denies numbness or tingling.  Endorses a skin tear to left lower leg present for 1 week after bumping an object.  Endorses there is yellowish drainage that is now present at the site with swelling surrounding it.  Denies fever, chills.  Endorses that she has been cleaning the leg wound with antiseptic soap daily.   Past Medical History:  Diagnosis Date   Allergic rhinitis    Allergy    uses Flonase daily as needed   Anxiety    takes Xanax daily as needed   Arthritis    "back" (04/21/2013)   Chronic lower back pain    Constipation    takes an OTC stool softener   Depression    takes Zoloft daily   Dry eyes    uses eye drops   Exertional shortness of breath    GERD (gastroesophageal reflux disease)    takes Omeprazole daily and Protonix daily as needed   H/O hiatal hernia    Headache(784.0)    sinus   Heart murmur    years ago   History of bronchitis    last time many yrs ago   Hypertension    takes Losartan and Cardizem daily   Hypothyroidism    takes Synthroid daily   Joint pain    Joint swelling    Nocturia    Osteoporosis    Pressure in chest 08/23/13   fell and started having chest pressure, will have ECHO and stress test 09/04/13 before surgery   Ringing in ears    sees Dr.Byers for this   Scoliosis    Urinary frequency    Urinary incontinence    Urinary urgency     Patient Active Problem List   Diagnosis Date Noted   Asthma 03/26/2018   Left inguinal hernia 08/05/2017   Osteoarthritis of right knee  08/05/2017   Chronic pansinusitis 05/31/2016   Eustachian tube dysfunction, bilateral 05/31/2016   Sensorineural hearing loss (SNHL), bilateral 05/31/2016   Carpal tunnel syndrome 05/12/2015   Degenerative arthritis of finger 05/12/2015   Primary arthrosis of first carpometacarpal joints, bilateral 05/12/2015   Chronic kidney disease 05/22/2014   Left knee pain 09/08/2013   H/O total knee replacement 08/11/2013   S/P total knee arthroplasty 04/21/2013   Gonalgia 04/11/2013   Osteoporosis, unspecified 03/24/2012   At high risk for falls 02/12/2012   Leg pain, bilateral 02/12/2012   Anxiety 03/06/2011   COPD (chronic obstructive pulmonary disease) ([place]) 01/08/2011   Hypertension 01/05/2011   Hypothyroidism 01/05/2011   Recurrent sinusitis 01/05/2011   Allergic rhinitis 01/05/2011   Depression 01/05/2011   GE reflux 01/05/2011    Past Surgical History:  Procedure Laterality Date   CARDIAC CATHETERIZATION  2005   CATARACT EXTRACTION W/ INTRAOCULAR LENS  IMPLANT, BILATERAL Bilateral    COLONOSCOPY     ESOPHAGOGASTRODUODENOSCOPY     EXCISIONAL TOTAL KNEE ARTHROPLASTY Left 09/08/2013   Procedure: EXCISIONAL TOTAL KNEE ARTHROPLASTY POLYEXCHANGE;  Surgeon: Vickey Huger, MD;  Location: [facility];  Service: Orthopedics;  Laterality: Left;   EYE SURGERY     JOINT REPLACEMENT     KNEE ARTHROSCOPY Right    NASAL SINUS SURGERY     x 4   SHOULDER ARTHROSCOPY W/ ROTATOR CUFF REPAIR Right    TONSILLECTOMY  1940's   TOTAL KNEE ARTHROPLASTY Left 04/21/2013   TOTAL KNEE ARTHROPLASTY Left 04/21/2013   Procedure: TOTAL KNEE ARTHROPLASTY;  Surgeon: Vickey Huger, MD;  Location: [facility];  Service: Orthopedics;  Laterality: Left;   TUBAL LIGATION      OB History   No obstetric history on file.      Home Medications    Prior to Admission medications   Medication Sig Start Date End Date Taking? Authorizing Provider  cephALEXin (KEFLEX) 500 MG capsule Take 1 capsule (500 mg total) by mouth 2 (two)  times daily for 5 days. 08/22/21 08/27/21 Yes Greely Atiyeh, Leitha Schuller, NP  ALPRAZolam Duanne Moron) 0.5 MG tablet TAKE 1 TABLET BY MOUTH AT BEDTIME 04/13/21   Elby Showers, MD  amLODipine (NORVASC) 5 MG tablet Take 1 tablet by mouth once daily 04/20/21   Elby Showers, MD  aspirin EC 81 MG tablet Take 1 tablet (81 mg total) by mouth daily. Swallow whole. 07/22/21   Early Osmond, MD  budesonide-formoterol (SYMBICORT) 160-4.5 MCG/ACT inhaler Inhale 2 puffs into the lungs 3 times/day as needed-between meals & bedtime. 03/26/18   Martyn Ehrich, NP  Calcium-Vitamin D (CALTRATE 600 PLUS-VIT D PO) Take 1 tablet by mouth daily.    [provider]  furosemide (LASIX) 20 MG tablet Take 1 tablet (20 mg total) by mouth daily. 07/08/21   Elby Showers, MD  ibandronate (BONIVA) 150 MG tablet Take 1 tablet (150 mg total) by mouth every 30 (thirty) days. Take in the morning with a full glass of water, on an empty stomach, and do not take anything else by mouth or lie down for the next 30 min. 03/11/21   Elby Showers, MD  levothyroxine (SYNTHROID) 100 MCG tablet Take 1 tablet by mouth once daily 06/12/21   Elby Showers, MD  losartan (COZAAR) 100 MG tablet Take 1 tablet by mouth once daily 06/20/21   Elby Showers, MD  Omega-3 Fatty Acids (FISH OIL PO) Take 1 capsule by mouth daily.    [provider]  omeprazole (PRILOSEC) 20 MG capsule Take 1 capsule by mouth once daily 01/31/21   Elby Showers, MD    Family History Family History  Problem Relation Age of Onset   Heart failure Mother    Heart failure Father    Hypertension Sister    Neuropathy Neg Hx     Social History Social History   Tobacco Use   Smoking status: Former    Packs/day: 1.00    Years: 10.00    Total pack years: 10.00    Types: Cigarettes    Quit date: 03/30/1991    Years since quitting: 30.4   Smokeless tobacco: Never   Tobacco comments:    quit smoking in Mar 1993  Substance Use Topics   Alcohol use: No     Alcohol/week: 0.0 standard drinks of alcohol   Drug use: No     Allergies   Doxycycline   Review of Systems Review of Systems  Constitutional: Negative.   Respiratory: Negative.    Cardiovascular: Negative.   Skin:  Positive for wound. Negative for color change, pallor and  rash.  Neurological: Negative.      Physical Exam Triage Vital Signs ED Triage Vitals  Enc Vitals Group     BP 08/22/21 1603 110/73     Pulse Rate 08/22/21 1603 94     Resp 08/22/21 1603 20     Temp 08/22/21 1603 97.8 F (36.6 C)     Temp Source 08/22/21 1603 Oral     SpO2 08/22/21 1603 95 %     Weight --      Height --      Head Circumference --      Peak Flow --      Pain Score 08/22/21 1602 10     Pain Loc --      Pain Edu? --      Excl. in [place]? --    No data found.  Updated Vital Signs BP 110/73 (BP Location: Left Arm)   Pulse 94   Temp 97.8 F (36.6 C) (Oral)   Resp 20   SpO2 95%   Visual Acuity Right Eye Distance:   Left Eye Distance:   Bilateral Distance:    Right Eye Near:   Left Eye Near:    Bilateral Near:     Physical Exam Constitutional:      Appearance: Normal appearance.  HENT:     Head: Normocephalic.  Eyes:     Extraocular Movements: Extraocular movements intact.  Pulmonary:     Effort: Pulmonary effort is normal.  Skin:    Comments: 2 x 3 cm laceration present over the right epicondyle, bleeding has subsided, 2+ brachial pulse, range of motion of the arm intact, sensation intact  2 x 3 cm abrasion present to the left lower extremity with yellow eschar to the wound bed with mild erythema in nonpitting edema surrounding  Neurological:     Mental Status: She is alert and oriented to person, place, and time. Mental status is at baseline.  Psychiatric:        Mood and Affect: Mood normal.        Behavior: Behavior normal.      UC Treatments / Results  Labs (all labs ordered are listed, but only abnormal results are displayed) Labs Reviewed - No data to  display  EKG   Radiology No results found.  Procedures Procedures (including critical care time)  Medications Ordered in UC Medications - No data to display  Initial Impression / Assessment and Plan / UC Course  I have reviewed the triage vital signs and the nursing notes.  Pertinent labs & imaging results that were available during my care of the patient were reviewed by me and considered in my medical decision making (see chart for details).  Skin tear of right elbow without complication, initial encounter, infected abrasion of left leg, initial encounter  Skin tear here with Dermabond, tolerated well, advised daily cleansing with diluted soapy water, patting dry and covering if at risk for becoming contaminated otherwise may leave open to air, prescribed Keflex 5-day course to treat infection of abrasion to the left lower extremity, advised continued daily cleansing with antiseptic soap and elevation to help with edema, may use Tylenol for any general discomfort, recommended follow-up with urgent care or PCP if symptoms persist or for reevaluation to monitor healing Final Clinical Impressions(s) / UC Diagnoses   Final diagnoses:  Skin tear of left elbow without complication, initial encounter  Infected abrasion of left leg, initial encounter     Discharge Instructions  Skin has been put back together with clear, do not allow area to become wet with soap and water for the until tomorrow morning  Skin glue will fall off on its own  May cleanse site on elbow and area on lip with antiseptic cleanser that you already have at home, pat dry and cover with Band-Aid if at risk for becoming contaminated  Take Keflex every morning and every evening for the next 5 days to treat lower leg for infection and keep elbow from becoming infected  May elevate lower extremity to help reduce swelling  May follow-up with primary doctor or urgent care for reevaluation as needed   ED  Prescriptions     Medication Sig Dispense Auth. Provider   cephALEXin (KEFLEX) 500 MG capsule Take 1 capsule (500 mg total) by mouth 2 (two) times daily for 5 days. 10 capsule Hans Eden, NP      PDMP not reviewed this encounter.   Hans Eden, NP 08/22/21 1811    Hans Eden, NP 08/26/21 1137

## 2021-08-23 ENCOUNTER — Telehealth: Payer: Self-pay

## 2021-08-23 NOTE — Telephone Encounter (Signed)
   Cardiac Monitor Alert  Date of alert:  08/23/2021   Patient Name: Terri Bridges  DOB: [birth year]  MRN: [id number]   [facility] Cardiologist: Early Osmond, MD  [facility] EP:  None    Monitor Information: Cardiac Event Monitor [[employer]]  Reason:  fatigue Ordering provider:  Thukkani  :1}  Alert Ventricular Tachycardia This is the 1st alert for this rhythm.   Next Cardiology Appointment {  Date:  09/02/2021  Provider:  Ali Lowe  The patient could NOT be reached by telephone today.  08/23/2021  Damian Leavell, RN  08/23/2021 9:24 AM

## 2021-08-26 NOTE — Telephone Encounter (Signed)
Left message to call back  

## 2021-08-30 ENCOUNTER — Ambulatory Visit (INDEPENDENT_AMBULATORY_CARE_PROVIDER_SITE_OTHER): Payer: Medicare Other | Admitting: Internal Medicine

## 2021-08-30 ENCOUNTER — Encounter: Payer: Self-pay | Admitting: Internal Medicine

## 2021-08-30 VITALS — BP 102/72 | HR 85 | Temp 99.4°F | Wt 141.8 lb

## 2021-08-30 DIAGNOSIS — I340 Nonrheumatic mitral (valve) insufficiency: Secondary | ICD-10-CM | POA: Diagnosis not present

## 2021-08-30 DIAGNOSIS — R21 Rash and other nonspecific skin eruption: Secondary | ICD-10-CM | POA: Diagnosis not present

## 2021-08-30 DIAGNOSIS — I10 Essential (primary) hypertension: Secondary | ICD-10-CM

## 2021-08-30 DIAGNOSIS — Z8709 Personal history of other diseases of the respiratory system: Secondary | ICD-10-CM | POA: Diagnosis not present

## 2021-08-30 DIAGNOSIS — Z602 Problems related to living alone: Secondary | ICD-10-CM

## 2021-08-30 DIAGNOSIS — R600 Localized edema: Secondary | ICD-10-CM | POA: Diagnosis not present

## 2021-08-30 DIAGNOSIS — S50311D Abrasion of right elbow, subsequent encounter: Secondary | ICD-10-CM

## 2021-08-30 DIAGNOSIS — N1831 Chronic kidney disease, stage 3a: Secondary | ICD-10-CM | POA: Diagnosis not present

## 2021-08-30 DIAGNOSIS — F419 Anxiety disorder, unspecified: Secondary | ICD-10-CM

## 2021-08-30 DIAGNOSIS — R0602 Shortness of breath: Secondary | ICD-10-CM | POA: Diagnosis not present

## 2021-08-30 DIAGNOSIS — L089 Local infection of the skin and subcutaneous tissue, unspecified: Secondary | ICD-10-CM

## 2021-08-30 DIAGNOSIS — M810 Age-related osteoporosis without current pathological fracture: Secondary | ICD-10-CM | POA: Diagnosis not present

## 2021-08-30 DIAGNOSIS — S80812A Abrasion, left lower leg, initial encounter: Secondary | ICD-10-CM

## 2021-08-30 DIAGNOSIS — F32A Depression, unspecified: Secondary | ICD-10-CM

## 2021-08-30 MED ORDER — MUPIROCIN CALCIUM 2 % EX CREA: TOPICAL_CREAM | Freq: Once | CUTANEOUS | Status: DC

## 2021-08-30 MED ORDER — CEFTRIAXONE SODIUM 1 G IJ SOLR
1.0000 g | Freq: Once | INTRAMUSCULAR | Status: AC
  Administered 2021-08-30: 1 g via INTRAMUSCULAR

## 2021-08-30 MED ORDER — AMOXICILLIN-POT CLAVULANATE 875-125 MG PO TABS: 1.0000 | ORAL_TABLET | Freq: Two times a day (BID) | ORAL | 0 refills | Status: DC

## 2021-08-30 NOTE — Progress Notes (Signed)
   Subjective:    Patient ID: Terri Bridges, female    DOB: Nov 17, 1934, 87 y.o.   MRN: [id number]  HPI 87 year old Female seen for edema and abrasion of left lower leg. Does not remember injuring leg. Was on Cephalexin for 5 days after being seen at Urgent Care recently on August 14 for abrasion on right elbow.  Says she bumped her arm against a chair at church.  Was prescribed cephalexin 500 mg twice daily for 5 days.  Abrasion on right elbow was 2 x 3 cm over the right epicondyles.  This was treated with Dermabond.  She has a history of has CHF and LE edema. Has been weeping clear fluid from left lower leg. No fever or chills. Tetanus vaccine is up to date.  She has mixed obstructive and restrictive COPD.  Was seen in March for annual wellness visit  History of mitral valve regurgitation, hyperlipidemia, hypertension, COPD.  History of stage IIIa chronic kidney disease.  In July creatinine was 1.64.  Echocardiogram done in late July showed mild to moderate mitral regurgitation and left ventricular hypertrophy.  Left atrium was severely dilated.  No aortic valve regurgitation or stenosis.  Review of Systems she denies fever and chills.     Objective:   Physical Exam blood pressure 102/72 temperature 99.4 degrees by ear thermometer, pulse oximetry 98% ,weight is 141 pounds 12.8 ounces.  In March her weight was 151 pounds. Abrasion on right elbow seems to be healing.  However there is a new lesion on her left lower leg that is weeping clear fluid.  She has 1+ pitting edema of the left lower extremity.  Trace edema in the right lower extremity Her chest is clear.  Cardiac exam: Regular rate and rhythm with a 2/6 systolic ejection murmur.  Seems a bit weak and frail.  She is  slightly short of breath today but is also anxious.    Assessment & Plan:  Patient started on Augmentin 875/125 twice daily for 10 days.  Given 1 g IM Rocephin in the office.  She is to clean the left lower extremity wound  area with peroxide twice daily and apply Bactroban ointment twice daily.  It has to keep covered until healed.  Tetanus immunization is up-to-date.  She has Cardiology follow-up soon.  History of mixed obstructive and restrictive COPD followed by Dr. Valeta Harms treated with Symbicort 160/4.5 inhaler  Hypertension treated with losartan, metoprolol, Lasix 20 mg daily.  Lasix also trace dependent edema.  Anxiety treated with Xanax.  GE reflux treated with omeprazole.  Osteoporosis treated with Boniva.

## 2021-08-30 NOTE — Progress Notes (Signed)
Cardiology Office Note:    Date:  09/02/2021   ID:  Terri Bridges, DOB 03/25/1934, MRN [id number]  PCP:  Terri Showers, MD   [facility] Providers Cardiologist:  Terri Sciara, MD Referring MD: Terri Showers, MD   Chief Complaint/Reason for Referral: Dyspnea and bifascicular block  ASSESSMENT:    1. Dilated cardiomyopathy ([name])   2. Bifascicular block   3. Stage 3 chronic kidney disease, unspecified whether stage 3a or 3b CKD ([name])   4. Primary hypertension   5. Hyperlipidemia, unspecified hyperlipidemia type   6. Aortic atherosclerosis ([name])   7. Dyspnea, unspecified type   8. Nonrheumatic mitral valve regurgitation      PLAN:    In order of problems listed above: 1.  Cardiomyopathy:  We will obtain coronary CTA to evaluate coronary status given her cardiomyopathy.   I had a long conversation about how aggressive she would like to be in regards to coronary angiography, or the need for BiV ICD if her ejection fraction does not improve.  She is relatively reluctant to undergo invasive procedures or have devices placed.   We will obtain coronary CTA to evaluate coronary status given her cardiomyopathy.   We will start Jardiance 10 mg daily, continue losartan, stop amlodipine, and start Toprol-XL 25 mg at bedtime.  Check BMP in 1 week.  Will consider addition of spironolactone following this.  Refer to pharmacy for heart failure management.  We will consider cardiac MRI depending on CTA results.  We will obtain echocardiogram in 3 months time.  We will have patient follow-up with me in 6 months.   2.  Bifascicular block: Patient has a left bundle branch block.  We will optimize medical therapy and then decide about possible CRT D.  She is of advanced age; see discussion above.  A 30-day monitor demonstrated no significant arrhythmias or heart block. 3.  Stage III chronic kidney disease: We will need to follow electrolytes and creatinine carefully with med titration.  5.   Hyperlipidemia: Given advanced age will defer statin.   6.  Aortic atherosclerosis: Continue aspirin and defer atorvastatin. 7.  Dyspnea: We will optimize heart failure regimen and obtain coronary CTA as detailed above. 8.  Mitral regurgitation.  This is at least moderate on my review of her echocardiogram.  For now we will treat medically.            Dispo:  Return in about 6 months (around 03/05/2022).      Medication Adjustments/Labs and Tests Ordered: Current medicines are reviewed at length with the patient today.  Concerns regarding medicines are outlined above.  The following changes have been made:     Labs/tests ordered: Orders Placed This Encounter  Procedures   CT ANGIO CHEST AORTA W/CM & OR WO/CM   Basic metabolic panel   Amb Referral to Clinical Pharmacist   ECHOCARDIOGRAM COMPLETE    Medication Changes: Meds ordered this encounter  Medications   empagliflozin (JARDIANCE) 10 MG TABS tablet    Sig: Take 1 tablet (10 mg total) by mouth daily before breakfast.    Dispense:  90 tablet    Refill:  3   metoprolol succinate (TOPROL-XL) 25 MG 24 hr tablet    Sig: Take 1 tablet (25 mg total) by mouth daily.    Dispense:  90 tablet    Refill:  3     Current medicines are reviewed at length with the patient today.  The patient does not have concerns regarding  medicines.   History of Present Illness:    FOCUSED PROBLEM LIST:   1.  Aortic atherosclerosis on CT scan 2019 2.  Hypothyroidism 3.  Hyperlipidemia 4.  Hypertension 5.  Bifascicular block with first-degree and left bundle branch block 6.  Bronchitis followed by pulmonology 7.  Chronic kidney disease stage III 8.  Cardiomyopathy with ejection fraction of 30 to 35% 9.  At least moderate mitral regurgitation  July 2023: Patient seen for initial consultation regarding dyspnea and new bifascicular block.  She is noted to have a relatively low blood pressure and denied any shortness of breath.  Her biggest  complaint was fatigue.  For this reason her Lasix was held for 3 days.  An echocardiogram was obtained which demonstrated a new cardiomyopathy with an ejection fraction of 30 to 35%  Today: The patient continues to do well.  She is able to do most of her activities of daily living without any significant shortness of breath, presyncope, syncope, or chest pain.  She has had some peripheral edema but this seems to be related to cellulitis rather than heart failure symptoms.  She does sleep on 2 pillows and this has been a chronic practice for her stemming from chronic back pain issues.  She fortunately has not required any emergent C room visits or hospitalizations for cardiovascular issues.  Current Medications: Current Meds  Medication Sig   ALPRAZolam (XANAX) 0.5 MG tablet TAKE 1 TABLET BY MOUTH AT BEDTIME   amoxicillin-clavulanate (AUGMENTIN) 875-125 MG tablet Take 1 tablet by mouth 2 (two) times daily.   aspirin EC 81 MG tablet Take 1 tablet (81 mg total) by mouth daily. Swallow whole.   budesonide-formoterol (SYMBICORT) 160-4.5 MCG/ACT inhaler Inhale 2 puffs into the lungs 3 times/day as needed-between meals & bedtime.   Calcium-Vitamin D (CALTRATE 600 PLUS-VIT D PO) Take 1 tablet by mouth daily.   empagliflozin (JARDIANCE) 10 MG TABS tablet Take 1 tablet (10 mg total) by mouth daily before breakfast.   furosemide (LASIX) 20 MG tablet Take 1 tablet (20 mg total) by mouth daily.   ibandronate (BONIVA) 150 MG tablet Take 1 tablet (150 mg total) by mouth every 30 (thirty) days. Take in the morning with a full glass of water, on an empty stomach, and do not take anything else by mouth or lie down for the next 30 min.   levothyroxine (SYNTHROID) 100 MCG tablet Take 1 tablet by mouth once daily   losartan (COZAAR) 100 MG tablet Take 1 tablet by mouth once daily   metoprolol succinate (TOPROL-XL) 25 MG 24 hr tablet Take 1 tablet (25 mg total) by mouth daily.   mupirocin ointment (BACTROBAN) 2 % Apply  1 Application topically 2 (two) times daily.   Omega-3 Fatty Acids (FISH OIL PO) Take 1 capsule by mouth daily.   omeprazole (PRILOSEC) 20 MG capsule Take 1 capsule by mouth once daily   [DISCONTINUED] amLODipine (NORVASC) 5 MG tablet Take 1 tablet by mouth once daily   Current Facility-Administered Medications for the 09/02/21 encounter (Office Visit) with Early Osmond, MD  Medication   mupirocin cream (BACTROBAN) 2 %     Allergies:    Doxycycline   Social History:   Social History   Tobacco Use   Smoking status: Former    Packs/day: 1.00    Years: 10.00    Total pack years: 10.00    Types: Cigarettes    Quit date: 03/30/1991    Years since quitting: 30.4   Smokeless  tobacco: Never   Tobacco comments:    quit smoking in Mar 1993  Substance Use Topics   Alcohol use: No    Alcohol/week: 0.0 standard drinks of alcohol   Drug use: No     Family Hx: Family History  Problem Relation Age of Onset   Heart failure Mother    Heart failure Father    Hypertension Sister    Neuropathy Neg Hx      Review of Systems:   Please see the history of present illness.    All other systems reviewed and are negative.     EKGs/Labs/Other Test Reviewed:    EKG:  EKG performed July 2023 that I personally reviewed demonstrates sinus rhythm with first-degree AV block and left bundle branch block.  Prior CV studies:  TTE 2023: Ejection fraction of 30 to 35% with mild to moderate mitral regurgitation  30-day monitor 2023: Predominantly sinus rhythm with no atrial fibrillation, sustained ventricular tachyarrhythmias, or bradycardia arrhythmias with occasional nonsustained ventricular tachycardia, PVCs, and PACs.  Other studies Reviewed: Review of the additional studies/records demonstrates: CT abdomen pelvis 2019 with aortoiliac atherosclerosis  Recent Labs: 03/17/2021: TSH 1.59 07/08/2021: ALT 27; Hemoglobin 12.7; Platelets 172 07/15/2021: Brain Natriuretic Peptide 853; BUN 29; Creat  1.64; Potassium 4.7; Sodium 140   Recent Lipid Panel Lab Results  Component Value Date/Time   CHOL 189 03/17/2021 09:35 AM   TRIG 70 03/17/2021 09:35 AM   HDL 60 03/17/2021 09:35 AM   LDLCALC 113 (H) 03/17/2021 09:35 AM    Risk Assessment/Calculations:           Physical Exam:    VS:  BP 112/64   Pulse 93   Ht '4\' 11"'$  (1.499 m)   Wt 142 lb 6.4 oz (64.6 kg)   SpO2 97%   BMI 28.76 kg/m    Wt Readings from Last 3 Encounters:  09/02/21 142 lb 6.4 oz (64.6 kg)  08/30/21 141 lb 12.8 oz (64.3 kg)  07/22/21 140 lb 12.8 oz (63.9 kg)    GENERAL:  No apparent distress, AOx3 HEENT:  No carotid bruits, +2 carotid impulses, no scleral icterus; dry mucous membranes CAR: RRR with ectopy no murmurs, gallops, rubs, or thrills RES:  Clear to auscultation bilaterally ABD:  Soft, nontender, nondistended, positive bowel sounds x 4 VASC:  +2 radial pulses, +2 carotid pulses, palpable pedal pulses NEURO:  CN 2-12 grossly intact; motor and sensory grossly intact PSYCH:  No active depression or anxiety EXT:  +1 edema, without ecchymosis, or cyanosis  Signed, Early Osmond, MD  09/02/2021 11:18 AM    [address], [place], [place]  [address] Phone: [phone]; Fax: [phone]   Note:  This document was prepared using Dragon voice recognition software and may include unintentional dictation errors.

## 2021-08-31 NOTE — Telephone Encounter (Signed)
Reached pt who reports no abnormal heart beats/feelings/palpitations that evening, 8/14. Pt scheduled to see Dr. Ali Lowe Friday and appt time verified with pt.

## 2021-09-01 ENCOUNTER — Telehealth: Payer: Self-pay | Admitting: Internal Medicine

## 2021-09-01 ENCOUNTER — Other Ambulatory Visit: Payer: Self-pay

## 2021-09-01 MED ORDER — MUPIROCIN 2 % EX OINT: 1.0000 | TOPICAL_OINTMENT | Freq: Two times a day (BID) | CUTANEOUS | 3 refills | Status: DC

## 2021-09-01 NOTE — Telephone Encounter (Signed)
Rx sent to the pharmacy.

## 2021-09-01 NOTE — Telephone Encounter (Signed)
Terri Bridges called to say she was to follow up with a call in 2 day on how she is doing. Her wounds are doing pretty good. She is continuing to clean them Feeling pretty good will call back next week with update. She stated she thought she was to get some kind of prescription cream at drug store to put on wound, they never received that but she is taking her antibiotic. Looking better.

## 2021-09-02 ENCOUNTER — Ambulatory Visit: Payer: Medicare Other | Admitting: Internal Medicine

## 2021-09-02 ENCOUNTER — Encounter: Payer: Self-pay | Admitting: Internal Medicine

## 2021-09-02 VITALS — BP 112/64 | HR 93 | Ht 59.0 in | Wt 142.4 lb

## 2021-09-02 DIAGNOSIS — N183 Chronic kidney disease, stage 3 unspecified: Secondary | ICD-10-CM | POA: Diagnosis not present

## 2021-09-02 DIAGNOSIS — I10 Essential (primary) hypertension: Secondary | ICD-10-CM | POA: Diagnosis not present

## 2021-09-02 DIAGNOSIS — I340 Nonrheumatic mitral (valve) insufficiency: Secondary | ICD-10-CM

## 2021-09-02 DIAGNOSIS — I420 Dilated cardiomyopathy: Secondary | ICD-10-CM

## 2021-09-02 DIAGNOSIS — R0600 Dyspnea, unspecified: Secondary | ICD-10-CM

## 2021-09-02 DIAGNOSIS — E785 Hyperlipidemia, unspecified: Secondary | ICD-10-CM | POA: Diagnosis not present

## 2021-09-02 DIAGNOSIS — I452 Bifascicular block: Secondary | ICD-10-CM

## 2021-09-02 DIAGNOSIS — I700 Atherosclerosis of aorta: Secondary | ICD-10-CM | POA: Diagnosis not present

## 2021-09-02 MED ORDER — EMPAGLIFLOZIN 10 MG PO TABS: 10.0000 mg | ORAL_TABLET | Freq: Every day | ORAL | 3 refills | Status: DC

## 2021-09-02 MED ORDER — METOPROLOL SUCCINATE ER 25 MG PO TB24: 25.0000 mg | ORAL_TABLET | Freq: Every day | ORAL | 3 refills | Status: DC

## 2021-09-02 NOTE — Patient Instructions (Addendum)
Take Augmentin 875/125 twice daily by mouth for 10 days.  Clean wound on left lower extremity with peroxide twice daily and keep it covered after applying Bactroban ointment.  Keep wound covered until healed.  Tetanus immunization is up-to-date.  Keep cardiology follow-up.  She appears to be slightly short of breath today

## 2021-09-02 NOTE — Patient Instructions (Addendum)
Medication Instructions: STOP AMLODIPINE  START METOPROLOL 25 MG 1 EVERY DAY  START EMPAGLIFLOZIN  10 MG EVERY DAY   *If you need a refill on your cardiac medications before your next appointment, please call your pharmacy*   Lab Work: BMET  NEXT WEEK If you have labs (blood work) drawn today and your tests are completely normal, you will receive your results only by: [place] (if you have MyChart) OR A paper copy in the mail If you have any lab test that is abnormal or we need to change your treatment, we will call you to review the results.   Testing/Procedures: Your physician has requested that you have an echocardiogram. Echocardiography is a painless test that uses sound waves to create images of your heart. It provides your doctor with information about the size and shape of your heart and how well your heart's chambers and valves are working. This procedure takes approximately one hour. There are no restrictions for this procedure. DUE IN 3 MONTHS     Your cardiac CT will be scheduled at one of the below locations:   [facility] [address] [place], [place] [address] [phone]    If scheduled at [facility], please arrive at the [facility] (Entrance C2) of [facility] 30 minutes prior to test start time. You can use the FREE valet parking offered at entrance C (encouraged to control the heart rate for the test)  Proceed to the [facility] Radiology Department (first floor) to check-in and test prep.  All radiology patients and guests should use entrance C2 at [facility], accessed from [facility], even though the hospital's physical address listed is [address].     Please follow these instructions carefully (unless otherwise directed):   On the Night Before the Test: Be sure to Drink plenty of water. Do not consume any caffeinated/decaffeinated beverages or chocolate 12  hours prior to your test. Do not take any antihistamines 12 hours prior to your test. If the patient has contrast allergy: On the Day of the Test: Drink plenty of water until 1 hour prior to the test. Do not eat any food 4 hours prior to the test. You may take your regular medications prior to the test.  Take metoprolol (Lopressor) two hours prior to test. HOLD Furosemide/Hydrochlorothiazide morning of the test. FEMALES- please wear underwire-free bra if available, avoid dresses & tight clothing TAKE 75 MG OF METOPROLOL THE DAY OF CT  After the Test: Drink plenty of water. After receiving IV contrast, you may experience a mild flushed feeling. This is normal. On occasion, you may experience a mild rash up to 24 hours after the test. This is not dangerous. If this occurs, you can take Benadryl 25 mg and increase your fluid intake. If you experience trouble breathing, this can be serious. If it is severe call 911 IMMEDIATELY. If it is mild, please call our office. If you take any of these medications: Glipizide/Metformin, Avandament, Glucavance, please do not take 48 hours after completing test unless otherwise instructed.  We will call to schedule your test 2-4 weeks out understanding that some insurance companies will need an authorization prior to the service being performed.   For non-scheduling related questions, please contact the cardiac imaging nurse navigator should you have any questions/concerns: Marchia Bond, Cardiac Imaging Nurse Navigator Gordy Clement, Cardiac Imaging Nurse Navigator [facility] Direct Office Dial: [phone]  For scheduling needs, including cancellations and rescheduling, please call [place], [phone].   Follow-Up: At [facility], you and your health needs are our priority.  As part of our continuing mission to provide you with exceptional heart care, we have created designated Provider Care Teams.  These Care Teams  include your primary Cardiologist (physician) and Advanced Practice Providers (APPs -  Physician Assistants and Nurse Practitioners) who all work together to provide you with the care you need, when you need it.  We recommend signing up for the patient portal called "MyChart".  Sign up information is provided on this After Visit Summary.  MyChart is used to connect with patients for Virtual Visits (Telemedicine).  Patients are able to view lab/test results, encounter notes, upcoming appointments, etc.  Non-urgent messages can be sent to your provider as well.   To learn more about what you can do with MyChart, go to [email or web address].    Your next appointment:   6 month(s)  The format for your next appointment:   In Person  Provider:   Early Osmond, MD      Other Instructions [place] [facility]

## 2021-09-09 ENCOUNTER — Ambulatory Visit: Payer: Medicare Other | Attending: Internal Medicine

## 2021-09-09 DIAGNOSIS — N183 Chronic kidney disease, stage 3 unspecified: Secondary | ICD-10-CM

## 2021-09-10 LAB — BASIC METABOLIC PANEL
BUN/Creatinine Ratio: 16 (ref 12–28)
BUN: 23 mg/dL (ref 8–27)
CO2: 23 mmol/L (ref 20–29)
Calcium: 9.5 mg/dL (ref 8.7–10.3)
Chloride: 104 mmol/L (ref 96–106)
Creatinine, Ser: 1.43 mg/dL — ABNORMAL HIGH (ref 0.57–1.00)
Glucose: 90 mg/dL (ref 70–99)
Potassium: 3.7 mmol/L (ref 3.5–5.2)
Sodium: 143 mmol/L (ref 134–144)
eGFR: 36 mL/min/{1.73_m2} — ABNORMAL LOW (ref >59)

## 2021-09-13 ENCOUNTER — Telehealth: Payer: Self-pay | Admitting: Internal Medicine

## 2021-09-13 DIAGNOSIS — I10 Essential (primary) hypertension: Secondary | ICD-10-CM

## 2021-09-13 MED ORDER — SPIRONOLACTONE 25 MG PO TABS: 25.0000 mg | ORAL_TABLET | Freq: Every day | ORAL | 3 refills | Status: DC

## 2021-09-13 NOTE — Telephone Encounter (Signed)
Pt is returning call in regards to results. Transferred to Precious Gilding, RN.

## 2021-09-13 NOTE — Telephone Encounter (Signed)
The patient has been notified of the result and verbalized understanding.  All questions (if any) were answered. Precious Gilding, RN 09/13/2021 11:21 AM   Pt wrote down medication instruction and f/u lab appointment date (09/21/21).

## 2021-09-13 NOTE — Telephone Encounter (Signed)
-----   Message from Early Osmond, MD sent at 09/13/2021  8:27 AM EDT ----- Please start spironolactone '25mg'$  QPM and we need BMP next week.

## 2021-09-15 ENCOUNTER — Telehealth: Payer: Self-pay | Admitting: Internal Medicine

## 2021-09-15 NOTE — Telephone Encounter (Signed)
Pt c/o medication issue:  1. Name of Medication:   metoprolol succinate (TOPROL-XL) 25 MG 24 hr tablet    2. How are you currently taking this medication (dosage and times per day)? Taking since 08/25 when she was changed to this medication. Taking as prescribed.    3. Are you having a reaction (difficulty breathing--STAT)? Weak   4. What is your medication issue? Pt states that since she was taken off of other medication and put on this one she has extreme weakness and says she can not seem to shake it. She states she can hardly walk to her mail box.

## 2021-09-16 NOTE — Telephone Encounter (Signed)
Please call patient and get vitals. Please have her reduce metoprolol to 12.'5mg'$  daily.

## 2021-09-16 NOTE — Telephone Encounter (Signed)
Spoke with patient, and informed her of Marcelle Overlie, RPH-CPP's recommendation to decrease metoprolol to 12.'5mg'$  daily.  Patient states she does not have a BP cuff but will see if she can ask a friend to use theirs to check BP and HR. Instructed patient to call our office to let us know what her BP and HR are.   Patient also reports she is having labs drawn at PCPs office on 09/19/21 and asked if she needs to come in to have labs drawn again at our office on 09/21/21. Lab orders in Epic for Dr. Renold Genta include CMP (we were going to draw a BMP). Instructed patient to have labs drawn at PCP's office and have them fax her CMP to our office to be reviewed. Lab appt for 9/13 cancelled.  Patient verbalized understanding of the above and expressed appreciation for call.

## 2021-09-19 ENCOUNTER — Other Ambulatory Visit: Payer: Medicare Other

## 2021-09-19 DIAGNOSIS — E7800 Pure hypercholesterolemia, unspecified: Secondary | ICD-10-CM

## 2021-09-19 DIAGNOSIS — E039 Hypothyroidism, unspecified: Secondary | ICD-10-CM

## 2021-09-19 DIAGNOSIS — I10 Essential (primary) hypertension: Secondary | ICD-10-CM

## 2021-09-19 DIAGNOSIS — R7989 Other specified abnormal findings of blood chemistry: Secondary | ICD-10-CM

## 2021-09-19 DIAGNOSIS — F32A Depression, unspecified: Secondary | ICD-10-CM

## 2021-09-19 DIAGNOSIS — I517 Cardiomegaly: Secondary | ICD-10-CM

## 2021-09-19 NOTE — Addendum Note (Signed)
Addended by: Geradine Girt D on: 09/19/2021 09:11 AM   Modules accepted: Orders

## 2021-09-19 NOTE — Addendum Note (Signed)
Addended by: Geradine Girt D on: 09/19/2021 09:14 AM   Modules accepted: Orders

## 2021-09-20 ENCOUNTER — Other Ambulatory Visit: Payer: Medicare Other

## 2021-09-20 DIAGNOSIS — E039 Hypothyroidism, unspecified: Secondary | ICD-10-CM

## 2021-09-20 DIAGNOSIS — R7989 Other specified abnormal findings of blood chemistry: Secondary | ICD-10-CM | POA: Diagnosis not present

## 2021-09-20 DIAGNOSIS — F32A Depression, unspecified: Secondary | ICD-10-CM

## 2021-09-20 DIAGNOSIS — I10 Essential (primary) hypertension: Secondary | ICD-10-CM

## 2021-09-21 ENCOUNTER — Other Ambulatory Visit: Payer: Medicare Other

## 2021-09-21 LAB — COMPLETE METABOLIC PANEL WITH GFR
AG Ratio: 1.9 (calc) (ref 1.0–2.5)
ALT: 48 U/L — ABNORMAL HIGH (ref 6–29)
AST: 37 U/L — ABNORMAL HIGH (ref 10–35)
Albumin: 4.1 g/dL (ref 3.6–5.1)
Alkaline phosphatase (APISO): 60 U/L (ref 37–153)
BUN/Creatinine Ratio: 14 (calc) (ref 6–22)
BUN: 24 mg/dL (ref 7–25)
CO2: 27 mmol/L (ref 20–32)
Calcium: 9.6 mg/dL (ref 8.6–10.4)
Chloride: 105 mmol/L (ref 98–110)
Creat: 1.68 mg/dL — ABNORMAL HIGH (ref 0.60–0.95)
Globulin: 2.2 g/dL (calc) (ref 1.9–3.7)
Glucose, Bld: 93 mg/dL (ref 65–99)
Potassium: 4.7 mmol/L (ref 3.5–5.3)
Sodium: 144 mmol/L (ref 135–146)
Total Bilirubin: 0.7 mg/dL (ref 0.2–1.2)
Total Protein: 6.3 g/dL (ref 6.1–8.1)
eGFR: 30 mL/min/{1.73_m2} — ABNORMAL LOW (ref >=60)

## 2021-09-21 LAB — CBC WITH DIFFERENTIAL/PLATELET
Absolute Monocytes: 401 cells/uL (ref 200–950)
Basophils Absolute: 30 cells/uL (ref 0–200)
Basophils Relative: 0.5 %
Eosinophils Absolute: 289 cells/uL (ref 15–500)
Eosinophils Relative: 4.9 %
HCT: 39.7 % (ref 35.0–45.0)
Hemoglobin: 12.8 g/dL (ref 11.7–15.5)
Lymphs Abs: 1457 cells/uL (ref 850–3900)
MCH: 27.1 pg (ref 27.0–33.0)
MCHC: 32.2 g/dL (ref 32.0–36.0)
MCV: 84.1 fL (ref 80.0–100.0)
MPV: 11.4 fL (ref 7.5–12.5)
Monocytes Relative: 6.8 %
Neutro Abs: 3723 cells/uL (ref 1500–7800)
Neutrophils Relative %: 63.1 %
Platelets: 163 10*3/uL (ref 140–400)
RBC: 4.72 10*6/uL (ref 3.80–5.10)
RDW: 14.2 % (ref 11.0–15.0)
Total Lymphocyte: 24.7 %
WBC: 5.9 10*3/uL (ref 3.8–10.8)

## 2021-09-21 LAB — LIPID PANEL
Cholesterol: 174 mg/dL (ref <200)
HDL: 60 mg/dL (ref >=50)
LDL Cholesterol (Calc): 97 mg/dL (calc)
Non-HDL Cholesterol (Calc): 114 mg/dL (calc) (ref <130)
Total CHOL/HDL Ratio: 2.9 (calc) (ref <5.0)
Triglycerides: 78 mg/dL (ref <150)

## 2021-09-21 LAB — TSH: TSH: 2.77 mIU/L (ref 0.40–4.50)

## 2021-09-22 ENCOUNTER — Encounter: Payer: Self-pay | Admitting: Internal Medicine

## 2021-09-22 ENCOUNTER — Ambulatory Visit (INDEPENDENT_AMBULATORY_CARE_PROVIDER_SITE_OTHER): Payer: Medicare Other | Admitting: Internal Medicine

## 2021-09-22 VITALS — BP 114/74 | HR 76 | Temp 97.9°F | Ht 59.0 in | Wt 136.8 lb

## 2021-09-22 DIAGNOSIS — M810 Age-related osteoporosis without current pathological fracture: Secondary | ICD-10-CM | POA: Diagnosis not present

## 2021-09-22 DIAGNOSIS — I5023 Acute on chronic systolic (congestive) heart failure: Secondary | ICD-10-CM

## 2021-09-22 DIAGNOSIS — I10 Essential (primary) hypertension: Secondary | ICD-10-CM | POA: Diagnosis not present

## 2021-09-22 DIAGNOSIS — I8393 Asymptomatic varicose veins of bilateral lower extremities: Secondary | ICD-10-CM

## 2021-09-22 DIAGNOSIS — E039 Hypothyroidism, unspecified: Secondary | ICD-10-CM | POA: Diagnosis not present

## 2021-09-22 DIAGNOSIS — Z8709 Personal history of other diseases of the respiratory system: Secondary | ICD-10-CM

## 2021-09-22 DIAGNOSIS — R0602 Shortness of breath: Secondary | ICD-10-CM | POA: Diagnosis not present

## 2021-09-22 DIAGNOSIS — F419 Anxiety disorder, unspecified: Secondary | ICD-10-CM

## 2021-09-22 DIAGNOSIS — Z602 Problems related to living alone: Secondary | ICD-10-CM

## 2021-09-22 DIAGNOSIS — N1831 Chronic kidney disease, stage 3a: Secondary | ICD-10-CM

## 2021-09-22 DIAGNOSIS — I340 Nonrheumatic mitral (valve) insufficiency: Secondary | ICD-10-CM | POA: Diagnosis not present

## 2021-09-22 DIAGNOSIS — I517 Cardiomegaly: Secondary | ICD-10-CM

## 2021-09-22 NOTE — Progress Notes (Signed)
   Subjective:    Patient ID: Terri Bridges, female    DOB: [birth year], 87 y.o.   MRN: [id number]  HPI Here to day for follow up.  She is being followed by Cardiology for dilated cardiomyopathy.  She lives alone.  She has had an infected abrasion of her leg and August which was treated with antibiotics.  This is healed pretty well.  She was treated with Augmentin on August 22.  She has inhalers for home use.  She resides alone.  She denies chest pain.  At times is short of breath.  We have been over with her as to when she should take her medications.  Cardiologist plans to do coronary CTA.  He has placed her on Jardiance 10 mg daily.  She is to stop amlodipine and continue losartan.  She is to start Toprol XL 25 mg at bedtime.  Her cardiologist will give consideration to cardiac MRI depending on CTA results.  She is to have echocardiogram in November.  She has a left bundle branch block.  A 30-day monitor showed no significant arrhythmias or heart block.  She has stage IIIa chronic kidney disease.  She has aortic atherosclerosis.  Is not on statin at this time.  She has a history of mitral regurgitation.    Review of Systems does appear to be somewhat short of breath when talking but she is anxious today.     Objective:   Physical Exam Blood sugar 114/74 pulse 76 temperature 97.9 degrees pulse oximetry 97% weight 136 pounds 12.8 ounces BMI 27.63.  In March she weighed 151 pounds and BMI was 30.24. Her chest is clear.  Cardiac exam: The rate and rhythm.  Trace lower extremity edema.  Lower extremity varicosities.  She remains anxious.        Assessment & Plan:  Essential hypertension-stable on current regimen  Hypothyroidism-TSH is normal at 2.77  Anxiety state  Mild chronic kidney disease stage IIIa.  Creatinine is 1.68 today  Has decreased LV function of 30 to 35%  Has mild to moderate mitral regurgitation per recent echo  His left atrial dilatation which is severe  No evidence  of pericardial effusion  Prealbumin checked today and is low normal at 22  Plan: I think at her age symptomatic treatment is reasonable.  I have reached out to her family and left messages that I have been concerned about her but I have not heard back and they did not accompany her today to the visit.  She has an appointment with cardiology in October and I would encourage her family to attend that visit.

## 2021-09-23 LAB — PREALBUMIN: Prealbumin: 22 mg/dL (ref 17–34)

## 2021-09-28 ENCOUNTER — Encounter: Payer: Self-pay | Admitting: Internal Medicine

## 2021-09-28 ENCOUNTER — Telehealth: Payer: Self-pay | Admitting: Internal Medicine

## 2021-09-28 NOTE — Telephone Encounter (Signed)
Aerie Chrismer [phone]  Terri Bridges called to say that she got weak and sick 1-2 hours after she took her medicine yesterday.  She took  metoprolol succinate (TOPROL-XL) 25 MG 24 hr tablet levothyroxine (SYNTHROID) 100 MCG tablet omeprazole (PRILOSEC) 20 MG capsule aspirin EC 81 MG tablet losartan (COZAAR) 100 MG tablet

## 2021-09-28 NOTE — Telephone Encounter (Addendum)
After talking with Dr Renold Genta she said patient should try taking metoprolol and losartan at different times. Losartan should be taking first thing in the morning, metoprolol mid day.   Levothyroxine can be taking later just on empty stomach., it will not make you sick.  Terri Bridges can also make you nauseas.

## 2021-09-29 NOTE — Telephone Encounter (Signed)
Patient called and was informed of message from Dr. Renold Genta

## 2021-10-05 ENCOUNTER — Ambulatory Visit (HOSPITAL_COMMUNITY)
Admission: RE | Admit: 2021-10-05 | Discharge: 2021-10-05 | Disposition: A | Discharge status: Home / Self Care | Payer: Medicare Other | Source: Ambulatory Visit | Attending: Internal Medicine | Admitting: Internal Medicine

## 2021-10-05 DIAGNOSIS — J9811 Atelectasis: Secondary | ICD-10-CM | POA: Diagnosis not present

## 2021-10-05 DIAGNOSIS — R0600 Dyspnea, unspecified: Secondary | ICD-10-CM | POA: Diagnosis not present

## 2021-10-05 DIAGNOSIS — K449 Diaphragmatic hernia without obstruction or gangrene: Secondary | ICD-10-CM | POA: Diagnosis not present

## 2021-10-05 MED ORDER — IOHEXOL 350 MG/ML SOLN
50.0000 mL | Freq: Once | INTRAVENOUS | Status: AC | PRN
  Administered 2021-10-05: 50 mL via INTRAVENOUS

## 2021-10-08 NOTE — Patient Instructions (Signed)
Please keep appointment with Cardiologist in October.  Continue current medications.  Call if symptoms worsen.  Please have your family go to appointment with you to see cardiologist in October.

## 2021-10-10 ENCOUNTER — Ambulatory Visit: Payer: Medicare Other | Attending: Internal Medicine | Admitting: Pharmacist

## 2021-10-10 DIAGNOSIS — I5020 Unspecified systolic (congestive) heart failure: Secondary | ICD-10-CM | POA: Diagnosis not present

## 2021-10-10 MED ORDER — SPACER/AERO-HOLDING CHAMBERS DEVI: 1.0000 | Freq: Two times a day (BID) | 0 refills | Status: AC

## 2021-10-10 MED ORDER — METOPROLOL SUCCINATE ER 25 MG PO TB24: 12.5000 mg | ORAL_TABLET | Freq: Every day | ORAL | 3 refills | Status: DC

## 2021-10-10 NOTE — Progress Notes (Signed)
Patient ID: Terri Bridges                 DOB: 02/19/1934                      MRN: [id number]      HPI: Terri Bridges is a 87 y.o. female referred by Dr. Ali Lowe to pharmacy clinic for HF medication management. PMH is significant for CKD, bifascicular block, mitral regurgitation, HTN and COPD. Most recent LVEF 30-35% on 08/03/21. She as seen Dr. Dr. Ali Lowe on 09/02/21. She was started on empagliflozin and metoprolol succinate '25mg'$  daily. Amlodipine '5mg'$  was stopped. She was started on spironolactone '25mg'$  daily on 09/13/21. On 9/8 she called stating metoprolol was making her weak. We suggested she decrease her metoprolol to 12.'5mg'$  daily and start monitoring both her blood pressure and heart rate. Scr on 9/12 was relatively stable at 1.68.   Patient presents today to PharmD clinic. She appears overwhelmed with new medications. Upset and feels like she is having a bunch of tests done, but no one is telling her the results or why they are ordering tests/medications. She does have some shortness of breath. Using her nebulizer which gives her relief. Biggest complaint is weakness. She admits she is not eating much and has lost 20lb since March. Her PCP gave her instructions on how to space her medications apart to help with weakness/dizziness/?nausea. The multiple times per day dosing appears overwhelming to patient. She did admit that her dizziness went away when she decreased the dose of metoprolol to 12.'5mg'$  daily. She does have some mild swelling in ankles, improved from previous per patient. Left worse than right. Some healing sores on left leg.   Current CHF meds: empagliflozin '10mg'$  daily, furosemide '20mg'$  daily, losartan '100mg'$  daily, metoprolol 12.'5mg'$  daily, spironolactone '25mg'$  daily Previously tried:  BP goal: <130/80  Family History:  Family History  Problem Relation Age of Onset   Heart failure Mother    Heart failure Father    Hypertension Sister    Neuropathy Neg Hx     Social History:  quit in march 2011  Home BP readings: 409 systolic- patient states she bought a cuff. Does record readings but does not remember what they were.  Wt Readings from Last 3 Encounters:  09/22/21 136 lb 12.8 oz (62.1 kg)  09/02/21 142 lb 6.4 oz (64.6 kg)  08/30/21 141 lb 12.8 oz (64.3 kg)   BP Readings from Last 3 Encounters:  09/22/21 114/74  09/02/21 112/64  08/30/21 102/72   Pulse Readings from Last 3 Encounters:  09/22/21 76  09/02/21 93  08/30/21 85    Renal function: CrCl cannot be calculated (Unknown ideal weight.).  Past Medical History:  Diagnosis Date   Allergic rhinitis    Allergy    uses Flonase daily as needed   Anxiety    takes Xanax daily as needed   Arthritis    "back" (04/21/2013)   Chronic lower back pain    Constipation    takes an OTC stool softener   Depression    takes Zoloft daily   Dry eyes    uses eye drops   Exertional shortness of breath    GERD (gastroesophageal reflux disease)    takes Omeprazole daily and Protonix daily as needed   H/O hiatal hernia    Headache(784.0)    sinus   Heart murmur    years ago   History of bronchitis    last time  many yrs ago   Hypertension    takes Losartan and Cardizem daily   Hypothyroidism    takes Synthroid daily   Joint pain    Joint swelling    Nocturia    Osteoporosis    Pressure in chest 08/23/13   fell and started having chest pressure, will have ECHO and stress test 09/04/13 before surgery   Ringing in ears    sees Dr.Byers for this   Scoliosis    Urinary frequency    Urinary incontinence    Urinary urgency     Current Outpatient Medications on File Prior to Visit  Medication Sig Dispense Refill   ALPRAZolam (XANAX) 0.5 MG tablet TAKE 1 TABLET BY MOUTH AT BEDTIME 90 tablet 1   amoxicillin-clavulanate (AUGMENTIN) 875-125 MG tablet Take 1 tablet by mouth 2 (two) times daily. 20 tablet 0   aspirin EC 81 MG tablet Take 1 tablet (81 mg total) by mouth daily. Swallow whole. 90 tablet 3    budesonide-formoterol (SYMBICORT) 160-4.5 MCG/ACT inhaler Inhale 2 puffs into the lungs 3 times/day as needed-between meals & bedtime. 1 Inhaler 3   Calcium-Vitamin D (CALTRATE 600 PLUS-VIT D PO) Take 1 tablet by mouth daily.     empagliflozin (JARDIANCE) 10 MG TABS tablet Take 1 tablet (10 mg total) by mouth daily before breakfast. 90 tablet 3   furosemide (LASIX) 20 MG tablet Take 1 tablet (20 mg total) by mouth daily. 30 tablet 5   ibandronate (BONIVA) 150 MG tablet Take 1 tablet (150 mg total) by mouth every 30 (thirty) days. Take in the morning with a full glass of water, on an empty stomach, and do not take anything else by mouth or lie down for the next 30 min. 1 tablet 11   levothyroxine (SYNTHROID) 100 MCG tablet Take 1 tablet by mouth once daily 90 tablet 1   losartan (COZAAR) 100 MG tablet Take 1 tablet by mouth once daily 90 tablet 3   metoprolol succinate (TOPROL-XL) 25 MG 24 hr tablet Take 1 tablet (25 mg total) by mouth daily. (Patient taking differently: Take 12.5 mg by mouth daily. Patient taking half a tablet) 90 tablet 3   mupirocin ointment (BACTROBAN) 2 % Apply 1 Application topically 2 (two) times daily. 22 g 3   Omega-3 Fatty Acids (FISH OIL PO) Take 1 capsule by mouth daily.     omeprazole (PRILOSEC) 20 MG capsule Take 1 capsule by mouth once daily 90 capsule 3   spironolactone (ALDACTONE) 25 MG tablet Take 1 tablet (25 mg total) by mouth daily. 90 tablet 3   Current Facility-Administered Medications on File Prior to Visit  Medication Dose Route Frequency Provider Last Rate Last Admin   mupirocin cream (BACTROBAN) 2 %   Topical Once Baxley, Cresenciano Lick, MD        Allergies  Allergen Reactions   Doxycycline Rash     Assessment/Plan:  1. CHF - Blood pressure 110/70 in clinic. Due to complaints of weakness and her reported dizziness with higher dose to metoprolol will not make any medication dose changes. I do think her weakness is from lack of adequate nutrition. I  encouraged patient to eat, even if its small meals. Patient overwhelmed with taking medications 5 times a day. Jardiance does not have to be taken with food. I have consolidated her medications but still attempted to separate her blood pressure medications some. A written list was provided to patient. Patient has not been called with CT results yet. Will send  message to Dr. Unknown Foley as it looks like it hasn't been read yet. Follow up as needed.  2. Lung disease- Patient having a difficult time using her inhaler correctly. She also was not using it regularly twice a day. Educated the patient on proper use, 2 puffs twice a day. Also ordered her a spacer to use with her inhaler since she finds it difficult to press button and inhale at the same time.  Thank you,  Ramond Dial, Pharm.D, BCPS, CPP [facility] [address], [place], [place] [address]  Phone: [phone]; Fax: [phone]

## 2021-10-10 NOTE — Patient Instructions (Addendum)
6-7 AM  Furosemide '20mg'$  Jardiance '10mg'$    10 AM Losartan '100mg'$  Spironolactone '25mg'$  Aspirin '81mg'$  Breztri (inhaler) 2 puffs   11 PM Metoprolol succinate 12.'5mg'$  Levothyroxine '100mg'$   Alprazolam 0.'5mg'$  Breztri (inhaler) 2 puffs  Please call me at [phone] with any questions

## 2021-10-11 ENCOUNTER — Other Ambulatory Visit: Payer: Self-pay | Admitting: Internal Medicine

## 2021-10-12 ENCOUNTER — Telehealth: Payer: Self-pay | Admitting: Pharmacist

## 2021-10-12 ENCOUNTER — Other Ambulatory Visit: Payer: Self-pay

## 2021-10-12 NOTE — Telephone Encounter (Signed)
Patient called and reported she was up all night urinating and believes it is because it was due to her metoprolol. Advised it was not a fluid piull but if she would prefer to take it earlier in the day that was fine. Patient prefers to take at 10AM with other medications

## 2021-10-12 NOTE — Telephone Encounter (Addendum)
Pharmacy refill request  Error request already refilled

## 2021-10-20 ENCOUNTER — Telehealth: Payer: Self-pay | Admitting: Internal Medicine

## 2021-10-20 ENCOUNTER — Encounter: Payer: Self-pay | Admitting: Internal Medicine

## 2021-10-20 NOTE — Telephone Encounter (Signed)
Spoke to patient. She is nervous about going to church on Sunday. Says she isn't going to be going running to the bathroom. She also was very concerned that she wasn't taking any medications between 10 AM and 11PM. I explained to her that all her medications (expect her inhaler) last 24 hours so its ok that she doesn't take anything between 10AM and 11PM. I also explained that we did not want her to feel like she was confined to her house and she couldn't go anywhere. I explained that if she had somewhere to go, that she could skip taking her furosemide in the AM and could go where she needed to go and then take the furosemide when she got home. Patient states that she has never been so confused about her medications. I walked her though them again. Patient still upset that no one has told her what her scan say and she has more scheduled. I offered to have Dr. Dara Hoyer nurse call her to review the results again. Patient states that she cant even talk to a doctor. Doesn't see him until Feb and she doesn't want to talk to anyone today. She did mention something about living alone and maybe letting [facility] RN come in to see her. I told her I thought that was a great idea. Patient very upset. I tried to calm her down, but she hung up on me. I did call her back and she spoke with me for a few more min and then said she did not want to talk to anyone and hung up.   I do not see that pt CT scan has been commented on. I did review her echo with her at her apt with me.

## 2021-10-20 NOTE — Telephone Encounter (Signed)
Pt calling back for an update on here back from [facility] RPH-CPP

## 2021-10-20 NOTE — Telephone Encounter (Signed)
Have spoken with daughter, Fonda Kinder about patient being confused with meds. She needs to go stay with daughter and get these meds appropriately. Daughter will call her. MJB, MD

## 2021-10-27 NOTE — Telephone Encounter (Signed)
Called and left message for patient to call back.  Will schedule follow up with Dr. Ali Lowe to review recent testing and plan.

## 2021-10-28 NOTE — Telephone Encounter (Signed)
Patient is returning call.  °

## 2021-10-28 NOTE — Telephone Encounter (Signed)
I reached the patient and scheduled a follow up with Dr. Ali Lowe to review testing and arrange plan for next steps.  She feels like she looks like a bag of bones.  She feels there is something not right.  Not feeling like herself at all.  Bruising very easy.  Bruised putting lotion on her face.  She carries her mychart with her and has it up all the time.  Trying to follow what she is supposed to be doing.  She is upset that she has not heard much about her testing.  I apologized to her for the lack of communication she has received.    She feels she was much better before she started coming in to cardiology.   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   She thinks that had to do with the several medication changes and is more clear headed now.

## 2021-10-31 NOTE — Progress Notes (Unsigned)
Cardiology Office Note:    Date:  11/03/2021   ID:  Terri Bridges, DOB 01/25/1934, MRN [id number]  PCP:  Elby Showers, MD   [facility] Providers Cardiologist:  Lenna Sciara, MD Referring MD: Elby Showers, MD   Chief Complaint/Reason for Referral: Dyspnea and bifascicular block  ASSESSMENT:    1. Dilated cardiomyopathy ([place])   2. Bifascicular block   3. Stage 3 chronic kidney disease, unspecified whether stage 3a or 3b CKD ([place])   4. Hyperlipidemia LDL goal <70   5. Aortic atherosclerosis ([place])   6. Dyspnea, unspecified type   7. Nonrheumatic mitral valve regurgitation       PLAN:    In order of problems listed above: 1.  Cardiomyopathy: Had a long discussion with the patient about her overall treatment.  Given that she is 72 and frail I do not think invasive procedures are in her best interest.  We will obtain a coronary CTA for dyspnea and depending on these results we will push her medical therapy.  We will change her Toprol-XL to 25 mg at bedtime, change her losartan to 100 mg at bedtime, and the spironolactone to 25 mg at bedtime.  I will see her back in February at her previously scheduled appointment with another echocardiogram.   2.  Bifascicular block: Patient has a left bundle branch block.  We will optimize medical therapy and then decide about possible CRT D.  She is of advanced age however and I did discuss this with the patient.  We will have a further discussion as informed by her future echocardiogram. 3.  Stage III chronic kidney disease: We will need to follow electrolytes and creatinine carefully with med titration.  We are checking labs today regarding a coronary CTA study. 4.  Hyperlipidemia: Given advanced age will defer statin.   5.  Aortic atherosclerosis: Continue aspirin and defer atorvastatin. 6.  Dyspnea: This is somewhat improved.  She looks euvolemic on exam today. 7.  Mitral regurgitation.  This is at least moderate on my review of her  echocardiogram.  For now we will treat medically.            Dispo:  Return in about 4 months (around 03/06/2022).      Medication Adjustments/Labs and Tests Ordered: Current medicines are reviewed at length with the patient today.  Concerns regarding medicines are outlined above.  The following changes have been made:     Labs/tests ordered: Orders Placed This Encounter  Procedures   CT CORONARY MORPH W/CTA COR W/SCORE W/CA W/CM &/OR WO/CM   Basic Metabolic Panel (BMET)    Medication Changes: Meds ordered this encounter  Medications   losartan (COZAAR) 100 MG tablet    Sig: Take 1 tablet (100 mg total) by mouth at bedtime.    Dispense:  90 tablet    Refill:  3    Updating prescription to include that this is to be taken at BEDTIME   metoprolol succinate (TOPROL XL) 25 MG 24 hr tablet    Sig: Take 1 tablet (25 mg total) by mouth at bedtime.    Dispense:  90 tablet    Refill:  3    Dose increase and instructions to take at BEDTIME added   spironolactone (ALDACTONE) 25 MG tablet    Sig: Take 1 tablet (25 mg total) by mouth at bedtime.    Dispense:  90 tablet    Refill:  3    Updating prescription to change instructions to  take at BEDTIME     Current medicines are reviewed at length with the patient today.  The patient does not have concerns regarding medicines.   History of Present Illness:    FOCUSED PROBLEM LIST:   1.  Aortic atherosclerosis on CT scan 2019 2.  Hypothyroidism 3.  Hyperlipidemia 4.  Hypertension 5.  Bifascicular block with first-degree and left bundle branch block 6.  Bronchitis followed by pulmonology 7.  Chronic kidney disease stage III 8.  Cardiomyopathy with ejection fraction of 30 to 35% 9.  At least moderate mitral regurgitation  July 2023: Patient seen for initial consultation regarding dyspnea and new bifascicular block.  She is noted to have a relatively low blood pressure and denied any shortness of breath.  Her biggest complaint was  fatigue.  For this reason her Lasix was held for 3 days.  An echocardiogram was obtained which demonstrated a new cardiomyopathy with an ejection fraction of 30 to 35%  August 2023: The patient continues to do well.  She is able to do most of her activities of daily living without any significant shortness of breath, presyncope, syncope, or chest pain.  She has had some peripheral edema but this seems to be related to cellulitis rather than heart failure symptoms.  She does sleep on 2 pillows and this has been a chronic practice for her stemming from chronic back pain issues.  She fortunately has not required any emergent C room visits or hospitalizations for cardiovascular issues.  Plan: Start Jardiance 10 mg daily, continue losartan, stop amlodipine, and start Toprol-XL 25 mg daily; obtain coronary CTA to characterize cardiomyopathy given patient's reluctance for invasive procedures.  Today: The patient is here for expedited follow-up.  She has had issues with her medical therapy given the multiple new medication changes that were instituted at her last visit.  In the interim the patient had her Toprol decreased to half a pill because of some lightheadedness.  She is taking her Toprol, losartan, and spironolactone in the morning.  She also takes her Lasix in the morning as well as her Jardiance.  She feels like her breathing has been relatively stable.  She definitely cannot do as much as she could do about a year ago.  She denies any chest pain, syncope, significant peripheral edema, or paroxysmal nocturnal dyspnea.   Current Medications: Current Meds  Medication Sig   albuterol (PROVENTIL) (2.5 MG/3ML) 0.083% nebulizer solution Inhale 3 mLs into the lungs every 6 (six) hours as needed for wheezing or shortness of breath.   ALPRAZolam (XANAX) 0.5 MG tablet TAKE 1 TABLET BY MOUTH AT BEDTIME (Patient taking differently: Take 0.5 mg by mouth at bedtime.)   aspirin EC 81 MG tablet Take 1 tablet (81 mg  total) by mouth daily. Swallow whole.   Budeson-Glycopyrrol-Formoterol (BREZTRI AEROSPHERE) 160-9-4.8 MCG/ACT AERO Inhale 2 puffs into the lungs 2 (two) times daily.   Calcium-Vitamin D (CALTRATE 600 PLUS-VIT D PO) Take 1 tablet by mouth daily.   empagliflozin (JARDIANCE) 10 MG TABS tablet Take 1 tablet (10 mg total) by mouth daily before breakfast.   furosemide (LASIX) 20 MG tablet Take 1 tablet (20 mg total) by mouth daily.   ibandronate (BONIVA) 150 MG tablet Take 1 tablet (150 mg total) by mouth every 30 (thirty) days. Take in the morning with a full glass of water, on an empty stomach, and do not take anything else by mouth or lie down for the next 30 min.   levothyroxine (SYNTHROID) 100  MCG tablet Take 1 tablet by mouth once daily (Patient taking differently: Take 100 mcg by mouth daily before breakfast.)   metoprolol succinate (TOPROL XL) 25 MG 24 hr tablet Take 1 tablet (25 mg total) by mouth at bedtime.   mupirocin ointment (BACTROBAN) 2 % Apply 1 Application topically 2 (two) times daily.   Omega-3 Fatty Acids (FISH OIL PO) Take 1 capsule by mouth daily.   omeprazole (PRILOSEC) 20 MG capsule Take 1 capsule by mouth once daily (Patient taking differently: Take 20 mg by mouth daily.)   Spacer/Aero-Holding Chambers DEVI 1 each by Does not apply route in the morning and at bedtime.   [DISCONTINUED] losartan (COZAAR) 100 MG tablet Take 1 tablet by mouth once daily (Patient taking differently: Take 100 mg by mouth daily.)   [DISCONTINUED] metoprolol succinate (TOPROL-XL) 25 MG 24 hr tablet Take 0.5 tablets (12.5 mg total) by mouth daily. Patient taking half a tablet (Patient taking differently: Take 6.25 mg by mouth daily. Patient taking half a tablet)   [DISCONTINUED] spironolactone (ALDACTONE) 25 MG tablet Take 1 tablet (25 mg total) by mouth daily.   Current Facility-Administered Medications for the 11/03/21 encounter (Office Visit) with Early Osmond, MD  Medication   mupirocin cream  (BACTROBAN) 2 %     Allergies:    Doxycycline   Social History:   Social History   Tobacco Use   Smoking status: Former    Packs/day: 1.00    Years: 10.00    Total pack years: 10.00    Types: Cigarettes    Quit date: 03/30/1991    Years since quitting: 30.6   Smokeless tobacco: Never   Tobacco comments:    quit smoking in Mar 1993  Substance Use Topics   Alcohol use: No    Alcohol/week: 0.0 standard drinks of alcohol   Drug use: No     Family Hx: Family History  Problem Relation Age of Onset   Heart failure Mother    Heart failure Father    Hypertension Sister    Neuropathy Neg Hx      Review of Systems:   Please see the history of present illness.    All other systems reviewed and are negative.     EKGs/Labs/Other Test Reviewed:    EKG:  EKG performed July 2023 that I personally reviewed demonstrates sinus rhythm with first-degree AV block and left bundle branch block.  Prior CV studies:  TTE 2023: Ejection fraction of 30 to 35% with mild to moderate mitral regurgitation  30-day monitor 2023: Predominantly sinus rhythm with no atrial fibrillation, sustained ventricular tachyarrhythmias, or bradycardia arrhythmias with occasional nonsustained ventricular tachycardia, PVCs, and PACs.  Other studies Reviewed: Review of the additional studies/records demonstrates: CT abdomen pelvis 2019 with aortoiliac atherosclerosis  Recent Labs: 07/15/2021: Brain Natriuretic Peptide 853 09/20/2021: ALT 48; BUN 24; Creat 1.68; Hemoglobin 12.8; Platelets 163; Potassium 4.7; Sodium 144; TSH 2.77   Recent Lipid Panel Lab Results  Component Value Date/Time   CHOL 174 09/20/2021 09:11 AM   TRIG 78 09/20/2021 09:11 AM   HDL 60 09/20/2021 09:11 AM   LDLCALC 97 09/20/2021 09:11 AM    Risk Assessment/Calculations:           Physical Exam:    VS:  BP 134/70 (BP Location: Left Arm, Patient Position: Sitting)   Pulse 71   Ht '5\' 3"'$  (1.6 m)   Wt 129 lb 6.4 oz (58.7 kg)    SpO2 94%   BMI 22.92 kg/m  Wt Readings from Last 3 Encounters:  11/03/21 129 lb 6.4 oz (58.7 kg)  10/10/21 132 lb 12.8 oz (60.2 kg)  09/22/21 136 lb 12.8 oz (62.1 kg)    GENERAL:  No apparent distress, AOx3 HEENT:  No carotid bruits, +2 carotid impulses, no scleral icterus; dry mucous membranes CAR: RRR with ectopy no murmurs, gallops, rubs, or thrills RES:  Clear to auscultation bilaterally ABD:  Soft, nontender, nondistended, positive bowel sounds x 4 VASC:  +2 radial pulses, +2 carotid pulses, palpable pedal pulses NEURO:  CN 2-12 grossly intact; motor and sensory grossly intact PSYCH:  No active depression or anxiety EXT:  No edema, + ecchymosis, or cyanosis  Signed, Early Osmond, MD  11/03/2021 12:18 PM    [address] [place], [place],   [address] Phone: [phone]; Fax: [phone]   Note:  This document was prepared using Dragon voice recognition software and may include unintentional dictation errors.

## 2021-11-03 ENCOUNTER — Encounter: Payer: Self-pay | Admitting: Internal Medicine

## 2021-11-03 ENCOUNTER — Ambulatory Visit: Payer: Medicare Other | Attending: Internal Medicine | Admitting: Internal Medicine

## 2021-11-03 VITALS — BP 134/70 | HR 71 | Ht 63.0 in | Wt 129.4 lb

## 2021-11-03 DIAGNOSIS — N183 Chronic kidney disease, stage 3 unspecified: Secondary | ICD-10-CM

## 2021-11-03 DIAGNOSIS — I700 Atherosclerosis of aorta: Secondary | ICD-10-CM | POA: Diagnosis not present

## 2021-11-03 DIAGNOSIS — I340 Nonrheumatic mitral (valve) insufficiency: Secondary | ICD-10-CM

## 2021-11-03 DIAGNOSIS — I420 Dilated cardiomyopathy: Secondary | ICD-10-CM

## 2021-11-03 DIAGNOSIS — E785 Hyperlipidemia, unspecified: Secondary | ICD-10-CM | POA: Diagnosis not present

## 2021-11-03 DIAGNOSIS — I452 Bifascicular block: Secondary | ICD-10-CM | POA: Diagnosis not present

## 2021-11-03 DIAGNOSIS — R0600 Dyspnea, unspecified: Secondary | ICD-10-CM | POA: Diagnosis not present

## 2021-11-03 MED ORDER — SPIRONOLACTONE 25 MG PO TABS: 25.0000 mg | ORAL_TABLET | Freq: Every day | ORAL | 3 refills | Status: DC

## 2021-11-03 MED ORDER — LOSARTAN POTASSIUM 100 MG PO TABS: 100.0000 mg | ORAL_TABLET | Freq: Every day | ORAL | 3 refills | Status: DC

## 2021-11-03 MED ORDER — METOPROLOL SUCCINATE ER 25 MG PO TB24: 25.0000 mg | ORAL_TABLET | Freq: Every day | ORAL | 3 refills | Status: DC

## 2021-11-03 NOTE — Patient Instructions (Addendum)
Medication Instructions:  Your physician has recommended you make the following change in your medication:  1.) change metoprolol succinate to 25 mg and take at bedtime (one tablet) 2.) take losartan at bedtime 3.) take spironolactone at bedtime  *If you need a refill on your cardiac medications before your next appointment, please call your pharmacy*   Lab Work: Today: BMET   Testing/Procedures: please schedule echo same day as next appointment in February, or a week prior Your physician has requested that you have an echocardiogram. Echocardiography is a painless test that uses sound waves to create images of your heart. It provides your doctor with information about the size and shape of your heart and how well your heart's chambers and valves are working. This procedure takes approximately one hour. There are no restrictions for this procedure. Please do NOT wear cologne, perfume, aftershave, or lotions (deodorant is allowed). Please arrive 15 minutes prior to your appointment time.  Cardiac CTA - see instructions below  Follow-Up: February as planned.    Other Instructions   Your cardiac CT will be scheduled at  [facility], [place] [address] [phone]  Please arrive at the [facility] and Children's Entrance (Entrance C2) of [facility] 30 minutes prior to test start time. You can use the FREE valet parking offered at entrance C (encouraged to control the heart rate for the test)  Proceed to the [facility] Radiology Department (first floor) to check-in and test prep.  All radiology patients and guests should use entrance C2 at [facility], accessed from [facility], even though the hospital's physical address listed is [address]     Please follow these instructions carefully (unless otherwise directed):   On the Night Before the Test: Be sure to Drink plenty of water. Do not consume any  caffeinated/decaffeinated beverages or chocolate 12 hours prior to your test. Do not take any antihistamines 12 hours prior to your test.  On the Day of the Test: Drink plenty of water until 1 hour prior to the test. Do not eat any food 1 hour prior to test. You may take your regular medications prior to the test.  Take one metoprolol succinate two hours prior to test. HOLD Furosemide the morning of the test. FEMALES- please wear underwire-free bra if available, avoid dresses & tight clothing      After the Test: Drink plenty of water. After receiving IV contrast, you may experience a mild flushed feeling. This is normal. On occasion, you may experience a mild rash up to 24 hours after the test. This is not dangerous. If this occurs, you can take Benadryl 25 mg and increase your fluid intake. If you experience trouble breathing, this can be serious. If it is severe call 911 IMMEDIATELY. If it is mild, please call our office. If you take any of these medications: Glipizide/Metformin, Avandament, Glucavance, please do not take 48 hours after completing test unless otherwise instructed.  We will call to schedule your test 2-4 weeks out understanding that some insurance companies will need an authorization prior to the service being performed.   For non-scheduling related questions, please contact the cardiac imaging nurse navigator should you have any questions/concerns: Marchia Bond, Cardiac Imaging Nurse Navigator Gordy Clement, Cardiac Imaging Nurse Navigator [facility] Direct Office Dial: [phone]   For scheduling needs, including cancellations and rescheduling, please call [place], [phone].

## 2021-11-04 ENCOUNTER — Telehealth: Payer: Self-pay | Admitting: Pharmacist

## 2021-11-04 LAB — BASIC METABOLIC PANEL
BUN/Creatinine Ratio: 14 (ref 12–28)
BUN: 24 mg/dL (ref 8–27)
CO2: 26 mmol/L (ref 20–29)
Calcium: 10.1 mg/dL (ref 8.7–10.3)
Chloride: 103 mmol/L (ref 96–106)
Creatinine, Ser: 1.74 mg/dL — ABNORMAL HIGH (ref 0.57–1.00)
Glucose: 101 mg/dL — ABNORMAL HIGH (ref 70–99)
Potassium: 4.9 mmol/L (ref 3.5–5.2)
Sodium: 143 mmol/L (ref 134–144)
eGFR: 28 mL/min/{1.73_m2} — ABNORMAL LOW (ref >59)

## 2021-11-04 NOTE — Telephone Encounter (Signed)
Patient called to confirm the date and time of her echo and next apt. She also wanted to know if she could take her xanax with her bedtime meds. Advised that was ok. All questions answered.

## 2021-11-10 ENCOUNTER — Ambulatory Visit: Payer: Medicare Other | Admitting: Pulmonary Disease

## 2021-11-10 ENCOUNTER — Encounter: Payer: Self-pay | Admitting: Pulmonary Disease

## 2021-11-10 VITALS — BP 100/60 | HR 51 | Ht 63.0 in | Wt 127.0 lb

## 2021-11-10 DIAGNOSIS — M419 Scoliosis, unspecified: Secondary | ICD-10-CM

## 2021-11-10 DIAGNOSIS — K449 Diaphragmatic hernia without obstruction or gangrene: Secondary | ICD-10-CM | POA: Diagnosis not present

## 2021-11-10 DIAGNOSIS — J4540 Moderate persistent asthma, uncomplicated: Secondary | ICD-10-CM

## 2021-11-10 DIAGNOSIS — R942 Abnormal results of pulmonary function studies: Secondary | ICD-10-CM

## 2021-11-10 DIAGNOSIS — J302 Other seasonal allergic rhinitis: Secondary | ICD-10-CM

## 2021-11-10 NOTE — Progress Notes (Signed)
Synopsis: Referred in Feb 2020 for chronic cough, former patient of Dr. Ashok Cordia, PCP: Elby Showers, MD  Subjective:   PATIENT ID: Terri Bridges GENDER: female DOB: [birth year], MRN: [id number]  Chief Complaint  Patient presents with   Follow-up    C/o today of ongoing bronchitis symptoms. She has had 4 sinus surgeries in the past. She stopped in march 1993 smoking, <1 ppd for 20+ years. The sinuses were an issue for sometime and she states that they were having trouble draining them. She has had ongoing cough for several years. She has hiatal hernia and GERD, she taking PPI, omeprazole. She had pfts in the past with no evidence of obstruction. Spring time seasonal allergies. She has hay fever symptoms, trouble with perfumes. She wheezes routinely at night time. Cold air and wind makes her DOE and SOB much worse. No eczema as a kid. No admissions to the hospital or visits to ER recently for respiratory complaints. Currently taking symbicort and albuterol nebs. She feels like she has to use her nebulizer every night. She has noticed that the albuterol makes her anxious and shaky. She currently uses it 3 times per day.    OV 10/24/2018: Patient seen today in follow-up for recurrent bronchitis type symptoms.  She has a 20+-pack-year history of smoking as stated above.  She does have seasonal allergies and trouble with perfumes and cold air.  Her symptoms right now are well controlled with just as needed albuterol nebulizer.  She is unable to afford the Symbicort.  She also seems to be very frustrated with her home equipment supplier.  Was also frustrated with the pharmacy.  I explained that we would not help her anyway we can with giving her samples of medications if she wanted them.  She states that she does not want to use an inhaler she is doing fine with her nebulizer and I encouraged her to continue to do so.  PFTs again reviewed with the patient today in the office.  OV 05/12/2020: Here today for  follow up.  She does have ongoing complaints today of shortness of breath.  She has difficulty making it from her house to her car.  Last time she was seen by me was in October 2020.  She has had subsequent follow-up in the office with our APP's.  Seasonal allergies perfumes and cold continue to exacerbate.  She is relatively tethered to her albuterol nebulizer.  Unable to afford Symbicort in the past.  She is not currently on any maintenance inhalers.  She is confused as to the discussions that we had last time regarding her PFT results and the fact that she has kyphoscoliosis.  I explained what I meant by her last office visit and PFT results.  OV 04/07/2021: Here today for follow-up.  She still has ongoing shortness of breath.  Was unable to afford Trelegy.  She has not been on any maintenance inhalers for several months.  She uses her albuterol frequently.  She uses her albuterol nebulizer frequently.  We reviewed her PFTs today.  OV 05/09/2021: Here today for follow-up.  Her shortness of breath is much improved.  She feels like she can walk her dog out to the mailbox without getting short of breath.  She has been using her Breztri inhaler.  She was unable to afford Trelegy.  We submitted application for [place] and [place].  Not really sure what the status of this application is or whether or not she has been approved.  We will need to follow-up with [place] today.  OV 11/10/2021: Follow-up regarding chronic symptoms.  She is doing really well on Breztri.  She has a longstanding prior history of smoking.  She is PFTs with a mixed obstructive restrictive defect.  Overall she is feeling better.  She still feels like she is a little weak at times.  But she attributes these changes to her getting older.    Past Medical History:  Diagnosis Date   Allergic rhinitis    Allergy    uses Flonase daily as needed   Anxiety    takes Xanax daily as needed   Arthritis    "back" (04/21/2013)   Chronic lower back pain     Constipation    takes an OTC stool softener   Depression    takes Zoloft daily   Dry eyes    uses eye drops   Exertional shortness of breath    GERD (gastroesophageal reflux disease)    takes Omeprazole daily and Protonix daily as needed   H/O hiatal hernia    Headache(784.0)    sinus   Heart murmur    years ago   History of bronchitis    last time many yrs ago   Hypertension    takes Losartan and Cardizem daily   Hypothyroidism    takes Synthroid daily   Joint pain    Joint swelling    Nocturia    Osteoporosis    Pressure in chest 08/23/13   fell and started having chest pressure, will have ECHO and stress test 09/04/13 before surgery   Ringing in ears    sees Dr.Byers for this   Scoliosis    Urinary frequency    Urinary incontinence    Urinary urgency      Family History  Problem Relation Age of Onset   Heart failure Mother    Heart failure Father    Hypertension Sister    Neuropathy Neg Hx      Past Surgical History:  Procedure Laterality Date   CARDIAC CATHETERIZATION  2005   CATARACT EXTRACTION W/ INTRAOCULAR LENS  IMPLANT, BILATERAL Bilateral    COLONOSCOPY     ESOPHAGOGASTRODUODENOSCOPY     EXCISIONAL TOTAL KNEE ARTHROPLASTY Left 09/08/2013   Procedure: EXCISIONAL TOTAL KNEE ARTHROPLASTY POLYEXCHANGE;  Surgeon: Vickey Huger, MD;  Location: [facility];  Service: Orthopedics;  Laterality: Left;   EYE SURGERY     JOINT REPLACEMENT     KNEE ARTHROSCOPY Right    NASAL SINUS SURGERY     x 4   SHOULDER ARTHROSCOPY W/ ROTATOR CUFF REPAIR Right    TONSILLECTOMY  1940's   TOTAL KNEE ARTHROPLASTY Left 04/21/2013   TOTAL KNEE ARTHROPLASTY Left 04/21/2013   Procedure: TOTAL KNEE ARTHROPLASTY;  Surgeon: Vickey Huger, MD;  Location: [facility];  Service: Orthopedics;  Laterality: Left;   TUBAL LIGATION      Social History   Socioeconomic History   Marital status: Single    Spouse name: Not on file   Number of children: 6   Years of education: 12   Highest education level:  Not on file  Occupational History   Occupation: Retired    Fish farm manager: [employer]    Comment: also caregiver  Tobacco Use   Smoking status: Former    Packs/day: 1.00    Years: 10.00    Total pack years: 10.00    Types: Cigarettes    Quit date: 03/30/1991    Years since quitting: 30.6   Smokeless  tobacco: Never   Tobacco comments:    quit smoking in Mar 1993  Substance and Sexual Activity   Alcohol use: No    Alcohol/week: 0.0 standard drinks of alcohol   Drug use: No   Sexual activity: Never    Birth control/protection: Post-menopausal  Other Topics Concern   Not on file  Social History Narrative   Originally from [place].    Previously worked in Engineer, mining for [employer]    Has 2 dogs currently.    Remote exposure to parrots in her current home.    No known mold exposure.    Remote travel to [place].    Has 1 indoor plant.    Caffeine use: No soda   2 cups coffee every morning   Social Determinants of Health   Financial Resource Strain: Not on file  Food Insecurity: Not on file  Transportation Needs: Not on file  Physical Activity: Not on file  Stress: Not on file  Social Connections: Not on file  Intimate Partner Violence: Not on file     Allergies  Allergen Reactions   Doxycycline Rash     Outpatient Medications Prior to Visit  Medication Sig Dispense Refill   albuterol (PROVENTIL) (2.5 MG/3ML) 0.083% nebulizer solution Inhale 3 mLs into the lungs every 6 (six) hours as needed for wheezing or shortness of breath.     Budeson-Glycopyrrol-Formoterol (BREZTRI AEROSPHERE) 160-9-4.8 MCG/ACT AERO Inhale 2 puffs into the lungs 2 (two) times daily.     ALPRAZolam (XANAX) 0.5 MG tablet TAKE 1 TABLET BY MOUTH AT BEDTIME (Patient taking differently: Take 0.5 mg by mouth at bedtime.) 30 tablet 0   aspirin EC 81 MG tablet Take 1 tablet (81 mg total) by mouth daily. Swallow whole. 90 tablet 3   Calcium-Vitamin D (CALTRATE 600 PLUS-VIT D PO) Take 1 tablet by mouth daily.      empagliflozin (JARDIANCE) 10 MG TABS tablet Take 1 tablet (10 mg total) by mouth daily before breakfast. 90 tablet 3   furosemide (LASIX) 20 MG tablet Take 1 tablet (20 mg total) by mouth daily. 30 tablet 5   ibandronate (BONIVA) 150 MG tablet Take 1 tablet (150 mg total) by mouth every 30 (thirty) days. Take in the morning with a full glass of water, on an empty stomach, and do not take anything else by mouth or lie down for the next 30 min. 1 tablet 11   levothyroxine (SYNTHROID) 100 MCG tablet Take 1 tablet by mouth once daily (Patient taking differently: Take 100 mcg by mouth daily before breakfast.) 90 tablet 1   losartan (COZAAR) 100 MG tablet Take 1 tablet (100 mg total) by mouth at bedtime. 90 tablet 3   metoprolol succinate (TOPROL XL) 25 MG 24 hr tablet Take 1 tablet (25 mg total) by mouth at bedtime. 90 tablet 3   mupirocin ointment (BACTROBAN) 2 % Apply 1 Application topically 2 (two) times daily. 22 g 3   Omega-3 Fatty Acids (FISH OIL PO) Take 1 capsule by mouth daily.     omeprazole (PRILOSEC) 20 MG capsule Take 1 capsule by mouth once daily (Patient taking differently: Take 20 mg by mouth daily.) 90 capsule 3   Spacer/Aero-Holding Chambers DEVI 1 each by Does not apply route in the morning and at bedtime. 1 each 0   spironolactone (ALDACTONE) 25 MG tablet Take 1 tablet (25 mg total) by mouth at bedtime. 90 tablet 3   Facility-Administered Medications Prior to Visit  Medication Dose Route  Frequency Provider Last Rate Last Admin   mupirocin cream (BACTROBAN) 2 %   Topical Once Elby Showers, MD        Review of Systems  Constitutional:  Negative for chills, fever, malaise/fatigue and weight loss.  HENT:  Negative for hearing loss, sore throat and tinnitus.   Eyes:  Negative for blurred vision and double vision.  Respiratory:  Positive for shortness of breath. Negative for cough, hemoptysis, sputum production, wheezing and stridor.   Cardiovascular:  Negative for chest pain,  palpitations, orthopnea, leg swelling and PND.  Gastrointestinal:  Negative for abdominal pain, constipation, diarrhea, heartburn, nausea and vomiting.  Genitourinary:  Negative for dysuria, hematuria and urgency.  Musculoskeletal:  Negative for joint pain and myalgias.  Skin:  Negative for itching and rash.  Neurological:  Negative for dizziness, tingling, weakness and headaches.  Endo/Heme/Allergies:  Negative for environmental allergies. Does not bruise/bleed easily.  Psychiatric/Behavioral:  Negative for depression. The patient is not nervous/anxious and does not have insomnia.   All other systems reviewed and are negative.    Objective:  Physical Exam Vitals reviewed.  Constitutional:      General: She is not in acute distress.    Appearance: She is well-developed.  HENT:     Head: Normocephalic and atraumatic.  Eyes:     General: No scleral icterus.    Conjunctiva/sclera: Conjunctivae normal.     Pupils: Pupils are equal, round, and reactive to light.  Neck:     Vascular: No JVD.     Trachea: No tracheal deviation.  Cardiovascular:     Rate and Rhythm: Normal rate and regular rhythm.     Heart sounds: Normal heart sounds. No murmur heard. Pulmonary:     Effort: Pulmonary effort is normal. No tachypnea, accessory muscle usage or respiratory distress.     Breath sounds: No stridor. No wheezing, rhonchi or rales.     Comments: Diminshed bl breaths sounds  Abdominal:     General: Bowel sounds are normal. There is no distension.     Palpations: Abdomen is soft.     Tenderness: There is no abdominal tenderness.  Musculoskeletal:        General: Deformity present. No tenderness.     Cervical back: Neck supple.     Comments: Kyphoscoliosis   Lymphadenopathy:     Cervical: No cervical adenopathy.  Skin:    General: Skin is warm and dry.     Capillary Refill: Capillary refill takes less than 2 seconds.     Findings: No rash.  Neurological:     Mental Status: She is alert  and oriented to person, place, and time.  Psychiatric:        Behavior: Behavior normal.      Vitals:   11/10/21 1309  BP: 100/60  Pulse: (!) 51  SpO2: 97%  Weight: 127 lb (57.6 kg)  Height: '5\' 3"'$  (1.6 m)   97% on RA BMI Readings from Last 3 Encounters:  11/10/21 22.50 kg/m  11/03/21 22.92 kg/m  10/10/21 26.82 kg/m   Wt Readings from Last 3 Encounters:  11/10/21 127 lb (57.6 kg)  11/03/21 129 lb 6.4 oz (58.7 kg)  10/10/21 132 lb 12.8 oz (60.2 kg)    CBC    Component Value Date/Time   WBC 5.9 09/20/2021 0911   RBC 4.72 09/20/2021 0911   HGB 12.8 09/20/2021 0911   HCT 39.7 09/20/2021 0911   PLT 163 09/20/2021 0911   MCV 84.1 09/20/2021 0911  MCH 27.1 09/20/2021 0911   MCHC 32.2 09/20/2021 0911   RDW 14.2 09/20/2021 0911   LYMPHSABS 1,457 09/20/2021 0911   MONOABS 0.5 02/26/2018 0949   EOSABS 289 09/20/2021 0911   BASOSABS 30 09/20/2021 0911    Chest Imaging: 01/31/2016: CT chest Ascending thoracic aneurysm 3.8 cm, large hiatal hernia, granuloma posterior aspect of the right upper lobe major fissure.  Aberrant right subclavian artery. The patient's images have been independently reviewed by me.    Pulmonary Functions Testing Results:    Latest Ref Rng & Units 07/29/2015    3:49 PM  PFT Results  FVC-Pre L 2.15   FVC-Predicted Pre % 98   FVC-Post L 2.07   FVC-Predicted Post % 95   Pre FEV1/FVC % % 73   Post FEV1/FCV % % 71   FEV1-Pre L 1.57   FEV1-Predicted Pre % 97   FEV1-Post L 1.47   DLCO uncorrected ml/min/mmHg 17.85   DLCO UNC% % 94   DLCO corrected ml/min/mmHg 16.85   DLCO COR %Predicted % 89   DLVA Predicted % 102   TLC L 4.50   TLC % Predicted % 101   RV % Predicted % 112      FeNO: No results found for: "NITRICOXIDE"  Pathology: None   Echocardiogram:  2015 Study Conclusions - Left ventricle: The cavity size was normal. Systolic function was   normal. The estimated ejection fraction was in the range of 55%   to 60%.  Heart  Catheterization: None     Assessment & Plan:   Mixed obstructive and restrictive ventilatory defect  Moderate persistent asthma without complication  Kyphoscoliosis  Seasonal allergies  Hiatal hernia  Discussion:  This is an 87 year old female, likely moderate persistent asthma recurrent exacerbations as PFTs with a mixed obstructive restrictive defect.  Has been able to get medication sent to her house from [place].  Currently using Breztri.  Plan: Continue triple therapy inhaler Continue PPI Continue albuterol as needed We will follow up with [place] as needed or in 1 year.    Current Outpatient Medications:    albuterol (PROVENTIL) (2.5 MG/3ML) 0.083% nebulizer solution, Inhale 3 mLs into the lungs every 6 (six) hours as needed for wheezing or shortness of breath., Disp: , Rfl:    Budeson-Glycopyrrol-Formoterol (BREZTRI AEROSPHERE) 160-9-4.8 MCG/ACT AERO, Inhale 2 puffs into the lungs 2 (two) times daily., Disp: , Rfl:    ALPRAZolam (XANAX) 0.5 MG tablet, TAKE 1 TABLET BY MOUTH AT BEDTIME (Patient taking differently: Take 0.5 mg by mouth at bedtime.), Disp: 30 tablet, Rfl: 0   aspirin EC 81 MG tablet, Take 1 tablet (81 mg total) by mouth daily. Swallow whole., Disp: 90 tablet, Rfl: 3   Calcium-Vitamin D (CALTRATE 600 PLUS-VIT D PO), Take 1 tablet by mouth daily., Disp: , Rfl:    empagliflozin (JARDIANCE) 10 MG TABS tablet, Take 1 tablet (10 mg total) by mouth daily before breakfast., Disp: 90 tablet, Rfl: 3   furosemide (LASIX) 20 MG tablet, Take 1 tablet (20 mg total) by mouth daily., Disp: 30 tablet, Rfl: 5   ibandronate (BONIVA) 150 MG tablet, Take 1 tablet (150 mg total) by mouth every 30 (thirty) days. Take in the morning with a full glass of water, on an empty stomach, and do not take anything else by mouth or lie down for the next 30 min., Disp: 1 tablet, Rfl: 11   levothyroxine (SYNTHROID) 100 MCG tablet, Take 1 tablet by mouth once daily (Patient taking differently: Take  100 mcg by mouth daily before breakfast.), Disp: 90 tablet, Rfl: 1   losartan (COZAAR) 100 MG tablet, Take 1 tablet (100 mg total) by mouth at bedtime., Disp: 90 tablet, Rfl: 3   metoprolol succinate (TOPROL XL) 25 MG 24 hr tablet, Take 1 tablet (25 mg total) by mouth at bedtime., Disp: 90 tablet, Rfl: 3   mupirocin ointment (BACTROBAN) 2 %, Apply 1 Application topically 2 (two) times daily., Disp: 22 g, Rfl: 3   Omega-3 Fatty Acids (FISH OIL PO), Take 1 capsule by mouth daily., Disp: , Rfl:    omeprazole (PRILOSEC) 20 MG capsule, Take 1 capsule by mouth once daily (Patient taking differently: Take 20 mg by mouth daily.), Disp: 90 capsule, Rfl: 3   Spacer/Aero-Holding Chambers DEVI, 1 each by Does not apply route in the morning and at bedtime., Disp: 1 each, Rfl: 0   spironolactone (ALDACTONE) 25 MG tablet, Take 1 tablet (25 mg total) by mouth at bedtime., Disp: 90 tablet, Rfl: 3  Current Facility-Administered Medications:    mupirocin cream (BACTROBAN) 2 %, , Topical, Once, [place], Cresenciano Lick, MD   Garner Nash, DO [facility] 11/10/2021 1:27 PM

## 2021-11-10 NOTE — Patient Instructions (Signed)
Thank you for visiting Dr. Valeta Harms at [facility]. Today we recommend the following:  Continue breztri  Continue albuterol as needed   Return in about 1 year (around 11/11/2022), or if symptoms worsen or fail to improve, for with APP or Dr. Valeta Harms.    Please do your part to reduce the spread of COVID-19.

## 2021-11-17 ENCOUNTER — Other Ambulatory Visit: Payer: Self-pay | Admitting: Internal Medicine

## 2021-11-17 NOTE — Telephone Encounter (Signed)
Left message for patient to return call to office at [phone].  

## 2021-11-21 ENCOUNTER — Other Ambulatory Visit (HOSPITAL_COMMUNITY): Payer: Medicare Other

## 2021-11-21 NOTE — Telephone Encounter (Signed)
Patient does need Rx and waiting for pharmacy to call her to pick up

## 2021-11-21 NOTE — Telephone Encounter (Signed)
Patient called back stating she needs it to help her sleep and it keeps her mind from running and she can't go without it.

## 2021-12-09 ENCOUNTER — Telehealth: Payer: Self-pay | Admitting: Pulmonary Disease

## 2021-12-09 MED ORDER — ALBUTEROL SULFATE HFA 108 (90 BASE) MCG/ACT IN AERS: 2.0000 | INHALATION_SPRAY | Freq: Four times a day (QID) | RESPIRATORY_TRACT | 6 refills | Status: DC | PRN

## 2021-12-09 NOTE — Telephone Encounter (Signed)
Called and spoke to patient and went over instructions with her with breztri inhaler. She states she has a spacer to make sure she gets all of her inhaler. She asked if she could have an emergency inhaler incase she is out. It looks like she has the albuterol nebs for home. I told her to make sure she does not double up with the albuterol to either take the inhaler or the neb as needed. She verbalized understanding. Inhaler sent in. Nothing further needed

## 2021-12-15 ENCOUNTER — Other Ambulatory Visit: Payer: Self-pay | Admitting: Internal Medicine

## 2021-12-31 ENCOUNTER — Encounter (HOSPITAL_COMMUNITY): Payer: Self-pay

## 2021-12-31 ENCOUNTER — Ambulatory Visit (HOSPITAL_COMMUNITY)
Admission: EM | Admit: 2021-12-31 | Discharge: 2021-12-31 | Disposition: A | Discharge status: Home / Self Care | Payer: Medicare Other | Attending: Emergency Medicine | Admitting: Emergency Medicine

## 2021-12-31 DIAGNOSIS — S81812A Laceration without foreign body, left lower leg, initial encounter: Secondary | ICD-10-CM

## 2021-12-31 DIAGNOSIS — M25562 Pain in left knee: Secondary | ICD-10-CM

## 2021-12-31 MED ORDER — CEPHALEXIN 500 MG PO CAPS: 500.0000 mg | ORAL_CAPSULE | Freq: Two times a day (BID) | ORAL | 0 refills | Status: AC

## 2021-12-31 NOTE — ED Provider Notes (Addendum)
[place]   [id number] 12/31/21 Arrival Time: 1008  Chief Complaint  Patient presents with   Leg Injury   Extremity Laceration     SUBJECTIVE:  Terri Bridges is a 87 y.o. female who presents with a laceration that occurred few hours ago.  Symptoms began after she hit her leg against a chair.  Bleeding controlled.  Currently not on blood thinners.  Denies similar symptoms in the past.  Denies fever, chills, nausea, vomiting, redness, swelling, purulent drainage, decrease strength or sensation.   Td UTD: Yes and record was reviewed  ROS: As per HPI.  All other pertinent ROS negative.     Past Medical History:  Diagnosis Date   Allergic rhinitis    Allergy    uses Flonase daily as needed   Anxiety    takes Xanax daily as needed   Arthritis    "back" (04/21/2013)   Chronic lower back pain    Constipation    takes an OTC stool softener   Depression    takes Zoloft daily   Dry eyes    uses eye drops   Exertional shortness of breath    GERD (gastroesophageal reflux disease)    takes Omeprazole daily and Protonix daily as needed   H/O hiatal hernia    Headache(784.0)    sinus   Heart murmur    years ago   History of bronchitis    last time many yrs ago   Hypertension    takes Losartan and Cardizem daily   Hypothyroidism    takes Synthroid daily   Joint pain    Joint swelling    Nocturia    Osteoporosis    Pressure in chest 08/23/13   fell and started having chest pressure, will have ECHO and stress test 09/04/13 before surgery   Ringing in ears    sees Dr.Byers for this   Scoliosis    Urinary frequency    Urinary incontinence    Urinary urgency    Past Surgical History:  Procedure Laterality Date   CARDIAC CATHETERIZATION  2005   CATARACT EXTRACTION W/ INTRAOCULAR LENS  IMPLANT, BILATERAL Bilateral    COLONOSCOPY     ESOPHAGOGASTRODUODENOSCOPY     EXCISIONAL TOTAL KNEE ARTHROPLASTY Left 09/08/2013   Procedure: EXCISIONAL TOTAL KNEE  ARTHROPLASTY POLYEXCHANGE;  Surgeon: Vickey Huger, MD;  Location: [place];  Service: Orthopedics;  Laterality: Left;   EYE SURGERY     JOINT REPLACEMENT     KNEE ARTHROSCOPY Right    NASAL SINUS SURGERY     x 4   SHOULDER ARTHROSCOPY W/ ROTATOR CUFF REPAIR Right    TONSILLECTOMY  1940's   TOTAL KNEE ARTHROPLASTY Left 04/21/2013   TOTAL KNEE ARTHROPLASTY Left 04/21/2013   Procedure: TOTAL KNEE ARTHROPLASTY;  Surgeon: Vickey Huger, MD;  Location: [place];  Service: Orthopedics;  Laterality: Left;   TUBAL LIGATION     Allergies  Allergen Reactions   Doxycycline Rash   Current Facility-Administered Medications on File Prior to Encounter  Medication Dose Route Frequency Provider Last Rate Last Admin   mupirocin cream (BACTROBAN) 2 %   Topical Once Elby Showers, MD       Current Outpatient Medications on File Prior to Encounter  Medication Sig Dispense Refill   albuterol (PROVENTIL) (2.5 MG/3ML) 0.083% nebulizer solution Inhale 3 mLs into the lungs every 6 (six) hours as needed for wheezing or shortness of breath.     albuterol (VENTOLIN HFA) 108 (90 Base) MCG/ACT inhaler  Inhale 2 puffs into the lungs every 6 (six) hours as needed for wheezing or shortness of breath. 8 g 6   ALPRAZolam (XANAX) 0.5 MG tablet TAKE 1 TABLET BY MOUTH AT BEDTIME 30 tablet 2   aspirin EC 81 MG tablet Take 1 tablet (81 mg total) by mouth daily. Swallow whole. 90 tablet 3   Budeson-Glycopyrrol-Formoterol (BREZTRI AEROSPHERE) 160-9-4.8 MCG/ACT AERO Inhale 2 puffs into the lungs 2 (two) times daily.     Calcium-Vitamin D (CALTRATE 600 PLUS-VIT D PO) Take 1 tablet by mouth daily.     empagliflozin (JARDIANCE) 10 MG TABS tablet Take 1 tablet (10 mg total) by mouth daily before breakfast. 90 tablet 3   furosemide (LASIX) 20 MG tablet Take 1 tablet (20 mg total) by mouth daily. 30 tablet 5   ibandronate (BONIVA) 150 MG tablet Take 1 tablet (150 mg total) by mouth every 30 (thirty) days. Take in the morning with a full glass of  water, on an empty stomach, and do not take anything else by mouth or lie down for the next 30 min. 1 tablet 11   levothyroxine (SYNTHROID) 100 MCG tablet Take 1 tablet by mouth once daily 90 tablet 0   losartan (COZAAR) 100 MG tablet Take 1 tablet (100 mg total) by mouth at bedtime. 90 tablet 3   metoprolol succinate (TOPROL XL) 25 MG 24 hr tablet Take 1 tablet (25 mg total) by mouth at bedtime. 90 tablet 3   mupirocin ointment (BACTROBAN) 2 % Apply 1 Application topically 2 (two) times daily. 22 g 3   Omega-3 Fatty Acids (FISH OIL PO) Take 1 capsule by mouth daily.     omeprazole (PRILOSEC) 20 MG capsule Take 1 capsule by mouth once daily (Patient taking differently: Take 20 mg by mouth daily.) 90 capsule 3   Spacer/Aero-Holding Chambers DEVI 1 each by Does not apply route in the morning and at bedtime. 1 each 0   spironolactone (ALDACTONE) 25 MG tablet Take 1 tablet (25 mg total) by mouth at bedtime. 90 tablet 3   Social History   Socioeconomic History   Marital status: Single    Spouse name: Not on file   Number of children: 6   Years of education: 12   Highest education level: Not on file  Occupational History   Occupation: Retired    Fish farm manager: [employer]    Comment: also caregiver  Tobacco Use   Smoking status: Former    Packs/day: 1.00    Years: 10.00    Total pack years: 10.00    Types: Cigarettes    Quit date: 03/30/1991    Years since quitting: 30.7   Smokeless tobacco: Never   Tobacco comments:    quit smoking in Mar 1993  Substance and Sexual Activity   Alcohol use: No    Alcohol/week: 0.0 standard drinks of alcohol   Drug use: No   Sexual activity: Never    Birth control/protection: Post-menopausal  Other Topics Concern   Not on file  Social History Narrative   Originally from [place].    Previously worked in Engineer, mining for [employer]    Has 2 dogs currently.    Remote exposure to parrots in her current home.    No known mold exposure.    Remote travel to [place].    Has  1 indoor plant.    Caffeine use: No soda   2 cups coffee every morning   Social Determinants of Radio broadcast assistant  Strain: Not on file  Food Insecurity: Not on file  Transportation Needs: Not on file  Physical Activity: Not on file  Stress: Not on file  Social Connections: Not on file  Intimate Partner Violence: Not on file   Family History  Problem Relation Age of Onset   Heart failure Mother    Heart failure Father    Hypertension Sister    Neuropathy Neg Hx      OBJECTIVE:  Vitals:   12/31/21 1044  BP: 134/84  Pulse: 67  Resp: 16  Temp: 98 F (36.7 C)  TempSrc: Oral  SpO2: 96%     General appearance: alert; no distress Skin: laceration of left leg; size: approx 12 cm Heart: S1S2 normal Psychological: alert and cooperative; normal mood and affect   Results for orders placed or performed in visit on 67/59/16  Basic Metabolic Panel (BMET)  Result Value Ref Range   Glucose 101 (H) 70 - 99 mg/dL   BUN 24 8 - 27 mg/dL   Creatinine, Ser 1.74 (H) 0.57 - 1.00 mg/dL   eGFR 28 (L) >59 mL/min/1.73   BUN/Creatinine Ratio 14 12 - 28   Sodium 143 134 - 144 mmol/L   Potassium 4.9 3.5 - 5.2 mmol/L   Chloride 103 96 - 106 mmol/L   CO2 26 20 - 29 mmol/L   Calcium 10.1 8.7 - 10.3 mg/dL    Labs Reviewed - No data to display  No results found.  Procedure: Verbal consent obtained. Patient provided with risks and alternatives to the procedure. Wound copiously irrigated with NS then cleansed. Wound carefully explored. No foreign body or nonviable tissue were noted. Using sterile technique Dermabond was applied to reapproximate the wound. Patient tolerated procedure well. No complications. Patient advised to look for and return for any signs of infection such as redness, swelling, discharge, or worsening pain.   ASSESSMENT & PLAN:  1. Laceration of left lower extremity, initial encounter   2. Arthralgia of left lower leg     Meds ordered this encounter   Medications   cephALEXin (KEFLEX) 500 MG capsule    Sig: Take 1 capsule (500 mg total) by mouth 2 (two) times daily for 5 days.    Dispense:  10 capsule    Refill:  0   Discharge instructions  Non adherent dressing was applied  Keep covered  for next 24-48 hours and removed   Avoid submerging wound in water. Keflex was prescribed/ take as directed   Take OTC ibuprofen or tylenol as needed for pain releif Return sooner or go to the ED if you have any new or worsening symptoms such as increased pain, redness, swelling, drainage, discharge, decreased range of motion of extremity, etc..     Reviewed expectations re: course of current medical issues. Questions answered. Outlined signs and symptoms indicating need for more acute intervention. Patient verbalized understanding. After Visit Summary given.    Emerson Monte, FNP 12/31/21 1208    Emerson Monte, FNP 12/31/21 1212

## 2021-12-31 NOTE — ED Triage Notes (Signed)
Chief Complaint: Patient hit the lower calf of the left leg. States her skin is thin and the skin tore. States she normally has to have the skin glued and the skin is unable to tolerate stiches.   Onset: this morning   Prescriptions or OTC medications tried: No

## 2021-12-31 NOTE — Discharge Instructions (Addendum)
Non adherent dressing was applied  Keep covered  for next 24-48 hours and removed   Avoid submerging wound in water. Keflex was prescribed/ take as directed   Take OTC ibuprofen or tylenol as needed for pain releif Return sooner or go to the ED if you have any new or worsening symptoms such as increased pain, redness, swelling, drainage, discharge, decreased range of motion of extremity, etc

## 2022-01-01 ENCOUNTER — Ambulatory Visit (HOSPITAL_COMMUNITY): Payer: Medicare Other

## 2022-01-03 ENCOUNTER — Ambulatory Visit (HOSPITAL_COMMUNITY)
Admission: RE | Admit: 2022-01-03 | Discharge: 2022-01-03 | Disposition: A | Discharge status: Home / Self Care | Payer: Medicare Other | Source: Ambulatory Visit | Attending: Internal Medicine | Admitting: Internal Medicine

## 2022-01-03 ENCOUNTER — Encounter (HOSPITAL_COMMUNITY): Payer: Self-pay

## 2022-01-03 VITALS — BP 139/81 | HR 65 | Temp 98.3°F | Resp 18

## 2022-01-03 DIAGNOSIS — Z5189 Encounter for other specified aftercare: Secondary | ICD-10-CM

## 2022-01-03 DIAGNOSIS — S81812D Laceration without foreign body, left lower leg, subsequent encounter: Secondary | ICD-10-CM

## 2022-01-03 NOTE — ED Provider Notes (Signed)
[place]    CSN: [id number] Arrival date & time: 01/03/22  0805     History   Chief Complaint Chief Complaint  Patient presents with   Leg Injury    Was there dec.23   for accident on leg. Need dressing changed Having pain. What will be app. Time. - Entered by patient    HPI Terri Bridges is a 87 y.o. female.  Presents with left leg pain, wound check Was seen 3 days ago for laceration, closed with Dermabond, was put on Keflex. Reports the bandage has been stuck to the wound and she has not been able to take it off.  Reports she has been up and moving around a lot to try and promote drainage  Has not tried any medications for pain Taking abx as prescribed.  Past Medical History:  Diagnosis Date   Allergic rhinitis    Allergy    uses Flonase daily as needed   Anxiety    takes Xanax daily as needed   Arthritis    "back" (04/21/2013)   Chronic lower back pain    Constipation    takes an OTC stool softener   Depression    takes Zoloft daily   Dry eyes    uses eye drops   Exertional shortness of breath    GERD (gastroesophageal reflux disease)    takes Omeprazole daily and Protonix daily as needed   H/O hiatal hernia    Headache(784.0)    sinus   Heart murmur    years ago   History of bronchitis    last time many yrs ago   Hypertension    takes Losartan and Cardizem daily   Hypothyroidism    takes Synthroid daily   Joint pain    Joint swelling    Nocturia    Osteoporosis    Pressure in chest 08/23/13   fell and started having chest pressure, will have ECHO and stress test 09/04/13 before surgery   Ringing in ears    sees Dr.Byers for this   Scoliosis    Urinary frequency    Urinary incontinence    Urinary urgency     Patient Active Problem List   Diagnosis Date Noted   HFrEF (heart failure with reduced ejection fraction) ([place]) 10/10/2021   Asthma 03/26/2018   Left inguinal hernia 08/05/2017   Osteoarthritis of right knee 08/05/2017    Chronic pansinusitis 05/31/2016   Eustachian tube dysfunction, bilateral 05/31/2016   Sensorineural hearing loss (SNHL), bilateral 05/31/2016   Carpal tunnel syndrome 05/12/2015   Degenerative arthritis of finger 05/12/2015   Primary arthrosis of first carpometacarpal joints, bilateral 05/12/2015   Chronic kidney disease 05/22/2014   Left knee pain 09/08/2013   H/O total knee replacement 08/11/2013   S/P total knee arthroplasty 04/21/2013   Gonalgia 04/11/2013   Osteoporosis, unspecified 03/24/2012   At high risk for falls 02/12/2012   Leg pain, bilateral 02/12/2012   Anxiety 03/06/2011   COPD (chronic obstructive pulmonary disease) ([place]) 01/08/2011   Hypertension 01/05/2011   Hypothyroidism 01/05/2011   Recurrent sinusitis 01/05/2011   Allergic rhinitis 01/05/2011   Depression 01/05/2011   GE reflux 01/05/2011    Past Surgical History:  Procedure Laterality Date   CARDIAC CATHETERIZATION  2005   CATARACT EXTRACTION W/ INTRAOCULAR LENS  IMPLANT, BILATERAL Bilateral    COLONOSCOPY     ESOPHAGOGASTRODUODENOSCOPY     EXCISIONAL TOTAL KNEE ARTHROPLASTY Left 09/08/2013   Procedure: EXCISIONAL TOTAL KNEE ARTHROPLASTY POLYEXCHANGE;  Surgeon:  Vickey Huger, MD;  Location: [place];  Service: Orthopedics;  Laterality: Left;   EYE SURGERY     JOINT REPLACEMENT     KNEE ARTHROSCOPY Right    NASAL SINUS SURGERY     x 4   SHOULDER ARTHROSCOPY W/ ROTATOR CUFF REPAIR Right    TONSILLECTOMY  1940's   TOTAL KNEE ARTHROPLASTY Left 04/21/2013   TOTAL KNEE ARTHROPLASTY Left 04/21/2013   Procedure: TOTAL KNEE ARTHROPLASTY;  Surgeon: Vickey Huger, MD;  Location: [place];  Service: Orthopedics;  Laterality: Left;   TUBAL LIGATION      OB History   No obstetric history on file.      Home Medications    Prior to Admission medications   Medication Sig Start Date End Date Taking? Authorizing Provider  albuterol (PROVENTIL) (2.5 MG/3ML) 0.083% nebulizer solution Inhale 3 mLs into the lungs every 6  (six) hours as needed for wheezing or shortness of breath. 09/27/21  Yes [provider]  albuterol (VENTOLIN HFA) 108 (90 Base) MCG/ACT inhaler Inhale 2 puffs into the lungs every 6 (six) hours as needed for wheezing or shortness of breath. 12/09/21  Yes Icard, Octavio Graves, DO  ALPRAZolam Duanne Moron) 0.5 MG tablet TAKE 1 TABLET BY MOUTH AT BEDTIME 11/21/21  Yes Baxley, Cresenciano Lick, MD  aspirin EC 81 MG tablet Take 1 tablet (81 mg total) by mouth daily. Swallow whole. 07/22/21  Yes Early Osmond, MD  Budeson-Glycopyrrol-Formoterol (BREZTRI AEROSPHERE) 160-9-4.8 MCG/ACT AERO Inhale 2 puffs into the lungs 2 (two) times daily.   Yes [provider]  Calcium-Vitamin D (CALTRATE 600 PLUS-VIT D PO) Take 1 tablet by mouth daily.   Yes [provider]  cephALEXin (KEFLEX) 500 MG capsule Take 1 capsule (500 mg total) by mouth 2 (two) times daily for 5 days. 12/31/21 01/05/22 Yes Avegno, Darrelyn Hillock, FNP  empagliflozin (JARDIANCE) 10 MG TABS tablet Take 1 tablet (10 mg total) by mouth daily before breakfast. 09/02/21  Yes Early Osmond, MD  furosemide (LASIX) 20 MG tablet Take 1 tablet (20 mg total) by mouth daily. 07/08/21  Yes Baxley, Cresenciano Lick, MD  ibandronate (BONIVA) 150 MG tablet Take 1 tablet (150 mg total) by mouth every 30 (thirty) days. Take in the morning with a full glass of water, on an empty stomach, and do not take anything else by mouth or lie down for the next 30 min. 03/11/21  Yes Baxley, Cresenciano Lick, MD  levothyroxine (SYNTHROID) 100 MCG tablet Take 1 tablet by mouth once daily 12/15/21  Yes Baxley, Cresenciano Lick, MD  losartan (COZAAR) 100 MG tablet Take 1 tablet (100 mg total) by mouth at bedtime. 11/03/21  Yes Early Osmond, MD  metoprolol succinate (TOPROL XL) 25 MG 24 hr tablet Take 1 tablet (25 mg total) by mouth at bedtime. 11/03/21  Yes Early Osmond, MD  mupirocin ointment (BACTROBAN) 2 % Apply 1 Application topically 2 (two) times daily. 09/01/21  Yes Baxley, Cresenciano Lick, MD  Omega-3  Fatty Acids (FISH OIL PO) Take 1 capsule by mouth daily.   Yes [provider]  omeprazole (PRILOSEC) 20 MG capsule Take 1 capsule by mouth once daily Patient taking differently: Take 20 mg by mouth daily. 01/31/21  Yes Baxley, Cresenciano Lick, MD  Spacer/Aero-Holding Josiah Lobo DEVI 1 each by Does not apply route in the morning and at bedtime. 10/10/21  Yes Maccia, Melissa D, RPH-CPP  spironolactone (ALDACTONE) 25 MG tablet Take 1 tablet (25 mg total) by mouth at bedtime. 11/03/21  Yes Early Osmond, MD    Family History Family History  Problem Relation Age of Onset   Heart failure Mother    Heart failure Father    Hypertension Sister    Neuropathy Neg Hx     Social History Social History   Tobacco Use   Smoking status: Former    Packs/day: 1.00    Years: 10.00    Total pack years: 10.00    Types: Cigarettes    Quit date: 03/30/1991    Years since quitting: 30.7   Smokeless tobacco: Never   Tobacco comments:    quit smoking in Mar 1993  Substance Use Topics   Alcohol use: No    Alcohol/week: 0.0 standard drinks of alcohol   Drug use: No     Allergies   Doxycycline   Review of Systems Review of Systems As per HPI  Physical Exam Triage Vital Signs ED Triage Vitals  Enc Vitals Group     BP 01/03/22 0856 139/81     Pulse Rate 01/03/22 0856 65     Resp 01/03/22 0856 18     Temp 01/03/22 0856 98.3 F (36.8 C)     Temp Source 01/03/22 0856 Oral     SpO2 01/03/22 0856 95 %     Weight --      Height --      Head Circumference --      Peak Flow --      Pain Score 01/03/22 0854 9     Pain Loc --      Pain Edu? --      Excl. in [place]? --    No data found.  Updated Vital Signs BP 139/81 (BP Location: Right Arm)   Pulse 65   Temp 98.3 F (36.8 C) (Oral)   Resp 18   SpO2 95%    Physical Exam Vitals and nursing note reviewed.  Constitutional:      General: She is not in acute distress. HENT:     Mouth/Throat:     Pharynx: Oropharynx is clear.   Cardiovascular:     Rate and Rhythm: Normal rate and regular rhythm.     Pulses: Normal pulses.     Heart sounds: Normal heart sounds.  Pulmonary:     Effort: Pulmonary effort is normal.     Breath sounds: Normal breath sounds.  Musculoskeletal:        General: Swelling present. Normal range of motion.       Legs:     Comments: Left lower extremity laceration, dermabond intact. Mild bruising and swelling. Small bit of clear/bloody drainage at base of wound when pressure applied. No warmth, erythema, pus drainage. Sensation intact distally with full ROM of ankle.  Skin:    Capillary Refill: Capillary refill takes less than 2 seconds.     Findings: Laceration present.  Neurological:     Mental Status: She is alert and oriented to person, place, and time.     UC Treatments / Results  Labs (all labs ordered are listed, but only abnormal results are displayed) Labs Reviewed - No data to display  EKG  Radiology No results found.  Procedures Procedures  Medications Ordered in UC Medications - No data to display  Initial Impression / Assessment and Plan / UC Course  I have reviewed the triage vital signs and the nursing notes.  Pertinent labs & imaging results that were available during my care of the patient were reviewed by me and considered  in my medical decision making (see chart for details).  Bandage was irrigated and removed by [facility] CMA Dermabond intact, wound healing.  Cleaned and redressed with non-stick gauze, coban. Discussed wound care, pain management with tylenol at home. Elevating the leg to reduce swelling instead of walking around on it. Finish abx. Discussed signs of infection or worsening symptoms to monitor for. Return precautions discussed. Patient agrees to plan  Final Clinical Impressions(s) / UC Diagnoses   Final diagnoses:  Visit for wound check  Laceration of left lower leg, subsequent encounter     Discharge Instructions      Continue  the antibiotic twice daily with food. Finish the full course of medicine.  Keep the bandage on for the rest of today.   As discussed, the glue will come off on it's own. Avoid soaking the leg in water. A little drainage is normal. The cut should start to heal over the next several days.  I recommend to elevate the leg to reduce swelling. You can prop it up on a pillow.  For pain, use tylenol 2-3 times a day.  Please monitor for worsening pain, redness, bruising, bleeding. If you start to have foul smelling/discolored discharge from the wound please return.   With any concerns, please return for re-evaluation.     ED Prescriptions   None    PDMP not reviewed this encounter.   Nazir Hacker, Wells Guiles, PA-C 01/03/22 1011

## 2022-01-03 NOTE — ED Triage Notes (Signed)
Pt here to follow up from last week she said the bandage is stuck to her leg and keeps falling off she is scared to change it. She is stating that the leg is hurting still.

## 2022-01-03 NOTE — Discharge Instructions (Addendum)
Continue the antibiotic twice daily with food. Finish the full course of medicine.  Keep the bandage on for the rest of today.   As discussed, the glue will come off on it's own. Avoid soaking the leg in water. A little drainage is normal. The cut should start to heal over the next several days.  I recommend to elevate the leg to reduce swelling. You can prop it up on a pillow.  For pain, use tylenol 2-3 times a day.  Please monitor for worsening pain, redness, bruising, bleeding. If you start to have foul smelling/discolored discharge from the wound please return.   With any concerns, please return for re-evaluation.

## 2022-01-09 ENCOUNTER — Encounter (HOSPITAL_COMMUNITY): Payer: Self-pay

## 2022-01-09 ENCOUNTER — Ambulatory Visit (HOSPITAL_COMMUNITY)
Admission: RE | Admit: 2022-01-09 | Discharge: 2022-01-09 | Disposition: A | Discharge status: Home / Self Care | Payer: Medicare Other | Source: Ambulatory Visit | Attending: Nurse Practitioner | Admitting: Nurse Practitioner

## 2022-01-09 VITALS — BP 146/85 | HR 68 | Temp 98.0°F | Resp 12

## 2022-01-09 DIAGNOSIS — L03116 Cellulitis of left lower limb: Secondary | ICD-10-CM

## 2022-01-09 DIAGNOSIS — S81812D Laceration without foreign body, left lower leg, subsequent encounter: Secondary | ICD-10-CM | POA: Diagnosis not present

## 2022-01-09 MED ORDER — CEPHALEXIN 500 MG PO CAPS: 500.0000 mg | ORAL_CAPSULE | Freq: Two times a day (BID) | ORAL | 0 refills | Status: AC

## 2022-01-09 MED ORDER — BACITRACIN ZINC 500 UNIT/GM EX OINT
TOPICAL_OINTMENT | CUTANEOUS | Status: AC
  Filled 2022-01-09: qty 28.35

## 2022-01-09 MED ORDER — MUPIROCIN CALCIUM 2 % EX CREA: 1.0000 | TOPICAL_CREAM | Freq: Two times a day (BID) | CUTANEOUS | 0 refills | Status: AC

## 2022-01-09 NOTE — ED Triage Notes (Signed)
Follow up leg injury 1wk

## 2022-01-09 NOTE — Discharge Instructions (Signed)
Keep wound clean and dry and change dressing daily or more often if it becomes soiled We are restarting Keflex twice a day for 7 days Mupirocin antibiotic ointment to the wound twice daily Please follow-up in 2 days for wound check Please go to the emergency room if you have any worsening symptoms.  This includes but is not limited to fevers, chills, swelling or worsening drainage of the area.

## 2022-01-09 NOTE — ED Provider Notes (Signed)
[place]    CSN: [id number] Arrival date & time: 01/09/22  7106      History   Chief Complaint Chief Complaint  Patient presents with   Follow-up    Some painDrainage - Entered by patient    HPI Terri Bridges is a 88 y.o. female presents for evaluation of wound check.  Patient was seen in urgent care on 12/23 for laceration to her left lower leg.  Dermabond was applied to close the wound and she was started on Keflex twice daily for 5 days which she completed as prescribed.  She did return on 12/26 for recheck due to some concern of draining.  Wound was cleaned in clinic at that time and she was advised on wound care, instructed to elevate her leg, and take Tylenol.  No additional antibiotics were started.  Patient states today she noticed redness and swelling distal to the wound that extends to her ankle.  Denies any fevers or chills.  No calf pain.  No history of MRSA.  She has been trying to elevate at home as instructed previously.  She does continue to endorse drainage. No other concerns at this time.   HPI  Past Medical History:  Diagnosis Date   Allergic rhinitis    Allergy    uses Flonase daily as needed   Anxiety    takes Xanax daily as needed   Arthritis    "back" (04/21/2013)   Chronic lower back pain    Constipation    takes an OTC stool softener   Depression    takes Zoloft daily   Dry eyes    uses eye drops   Exertional shortness of breath    GERD (gastroesophageal reflux disease)    takes Omeprazole daily and Protonix daily as needed   H/O hiatal hernia    Headache(784.0)    sinus   Heart murmur    years ago   History of bronchitis    last time many yrs ago   Hypertension    takes Losartan and Cardizem daily   Hypothyroidism    takes Synthroid daily   Joint pain    Joint swelling    Nocturia    Osteoporosis    Pressure in chest 08/23/13   fell and started having chest pressure, will have ECHO and stress test 09/04/13 before surgery    Ringing in ears    sees Dr.Byers for this   Scoliosis    Urinary frequency    Urinary incontinence    Urinary urgency     Patient Active Problem List   Diagnosis Date Noted   HFrEF (heart failure with reduced ejection fraction) ([place]) 10/10/2021   Asthma 03/26/2018   Left inguinal hernia 08/05/2017   Osteoarthritis of right knee 08/05/2017   Chronic pansinusitis 05/31/2016   Eustachian tube dysfunction, bilateral 05/31/2016   Sensorineural hearing loss (SNHL), bilateral 05/31/2016   Carpal tunnel syndrome 05/12/2015   Degenerative arthritis of finger 05/12/2015   Primary arthrosis of first carpometacarpal joints, bilateral 05/12/2015   Chronic kidney disease 05/22/2014   Left knee pain 09/08/2013   H/O total knee replacement 08/11/2013   S/P total knee arthroplasty 04/21/2013   Gonalgia 04/11/2013   Osteoporosis, unspecified 03/24/2012   At high risk for falls 02/12/2012   Leg pain, bilateral 02/12/2012   Anxiety 03/06/2011   COPD (chronic obstructive pulmonary disease) ([place]) 01/08/2011   Hypertension 01/05/2011   Hypothyroidism 01/05/2011   Recurrent sinusitis 01/05/2011   Allergic rhinitis 01/05/2011  Depression 01/05/2011   GE reflux 01/05/2011    Past Surgical History:  Procedure Laterality Date   CARDIAC CATHETERIZATION  2005   CATARACT EXTRACTION W/ INTRAOCULAR LENS  IMPLANT, BILATERAL Bilateral    COLONOSCOPY     ESOPHAGOGASTRODUODENOSCOPY     EXCISIONAL TOTAL KNEE ARTHROPLASTY Left 09/08/2013   Procedure: EXCISIONAL TOTAL KNEE ARTHROPLASTY POLYEXCHANGE;  Surgeon: Vickey Huger, MD;  Location: [facility];  Service: Orthopedics;  Laterality: Left;   EYE SURGERY     JOINT REPLACEMENT     KNEE ARTHROSCOPY Right    NASAL SINUS SURGERY     x 4   SHOULDER ARTHROSCOPY W/ ROTATOR CUFF REPAIR Right    TONSILLECTOMY  1940's   TOTAL KNEE ARTHROPLASTY Left 04/21/2013   TOTAL KNEE ARTHROPLASTY Left 04/21/2013   Procedure: TOTAL KNEE ARTHROPLASTY;  Surgeon: Vickey Huger, MD;   Location: [facility];  Service: Orthopedics;  Laterality: Left;   TUBAL LIGATION      OB History   No obstetric history on file.      Home Medications    Prior to Admission medications   Medication Sig Start Date End Date Taking? Authorizing Provider  albuterol (PROVENTIL) (2.5 MG/3ML) 0.083% nebulizer solution Inhale 3 mLs into the lungs every 6 (six) hours as needed for wheezing or shortness of breath. 09/27/21   [provider]  albuterol (VENTOLIN HFA) 108 (90 Base) MCG/ACT inhaler Inhale 2 puffs into the lungs every 6 (six) hours as needed for wheezing or shortness of breath. 12/09/21   Icard, Octavio Graves, DO  ALPRAZolam Duanne Moron) 0.5 MG tablet TAKE 1 TABLET BY MOUTH AT BEDTIME 11/21/21   Elby Showers, MD  aspirin EC 81 MG tablet Take 1 tablet (81 mg total) by mouth daily. Swallow whole. 07/22/21   Early Osmond, MD  Budeson-Glycopyrrol-Formoterol (BREZTRI AEROSPHERE) 160-9-4.8 MCG/ACT AERO Inhale 2 puffs into the lungs 2 (two) times daily.    [provider]  Calcium-Vitamin D (CALTRATE 600 PLUS-VIT D PO) Take 1 tablet by mouth daily.    [provider]  empagliflozin (JARDIANCE) 10 MG TABS tablet Take 1 tablet (10 mg total) by mouth daily before breakfast. 09/02/21   Early Osmond, MD  furosemide (LASIX) 20 MG tablet Take 1 tablet (20 mg total) by mouth daily. 07/08/21   Elby Showers, MD  ibandronate (BONIVA) 150 MG tablet Take 1 tablet (150 mg total) by mouth every 30 (thirty) days. Take in the morning with a full glass of water, on an empty stomach, and do not take anything else by mouth or lie down for the next 30 min. 03/11/21   Elby Showers, MD  levothyroxine (SYNTHROID) 100 MCG tablet Take 1 tablet by mouth once daily 12/15/21   Elby Showers, MD  losartan (COZAAR) 100 MG tablet Take 1 tablet (100 mg total) by mouth at bedtime. 11/03/21   Early Osmond, MD  metoprolol succinate (TOPROL XL) 25 MG 24 hr tablet Take 1 tablet (25 mg total) by mouth at  bedtime. 11/03/21   Early Osmond, MD  mupirocin ointment (BACTROBAN) 2 % Apply 1 Application topically 2 (two) times daily. 09/01/21   Elby Showers, MD  Omega-3 Fatty Acids (FISH OIL PO) Take 1 capsule by mouth daily.    [provider]  omeprazole (PRILOSEC) 20 MG capsule Take 1 capsule by mouth once daily Patient taking differently: Take 20 mg by mouth daily. 01/31/21   Elby Showers, MD  Spacer/Aero-Holding Josiah Lobo DEVI 1 each  by Does not apply route in the morning and at bedtime. 10/10/21   Marcelle Overlie D, RPH-CPP  spironolactone (ALDACTONE) 25 MG tablet Take 1 tablet (25 mg total) by mouth at bedtime. 11/03/21   Early Osmond, MD    Family History Family History  Problem Relation Age of Onset   Heart failure Mother    Heart failure Father    Hypertension Sister    Neuropathy Neg Hx     Social History Social History   Tobacco Use   Smoking status: Former    Packs/day: 1.00    Years: 10.00    Total pack years: 10.00    Types: Cigarettes    Quit date: 03/30/1991    Years since quitting: 30.8   Smokeless tobacco: Never   Tobacco comments:    quit smoking in Mar 1993  Substance Use Topics   Alcohol use: No    Alcohol/week: 0.0 standard drinks of alcohol   Drug use: No     Allergies   Doxycycline   Review of Systems Review of Systems  Skin:  Positive for wound.     Physical Exam Triage Vital Signs ED Triage Vitals  Enc Vitals Group     BP 01/09/22 1010 (!) 146/85     Pulse Rate 01/09/22 1010 68     Resp 01/09/22 1010 12     Temp 01/09/22 1010 98 F (36.7 C)     Temp Source 01/09/22 1010 Oral     SpO2 01/09/22 1010 95 %     Weight --      Height --      Head Circumference --      Peak Flow --      Pain Score 01/09/22 1008 4     Pain Loc --      Pain Edu? --      Excl. in [place]? --    No data found.  Updated Vital Signs BP (!) 146/85 (BP Location: Right Arm)   Pulse 68   Temp 98 F (36.7 C) (Oral)   Resp 12   SpO2 95%    Visual Acuity Right Eye Distance:   Left Eye Distance:   Bilateral Distance:    Right Eye Near:   Left Eye Near:    Bilateral Near:     Physical Exam Vitals and nursing note reviewed.  Constitutional:      Appearance: Normal appearance.  HENT:     Head: Normocephalic and atraumatic.  Eyes:     Pupils: Pupils are equal, round, and reactive to light.  Cardiovascular:     Rate and Rhythm: Normal rate.  Pulmonary:     Effort: Pulmonary effort is normal.  Skin:    General: Skin is warm and dry.          Comments: Intact laceration to the left lower extremity with surrounding ecchymosis.  There is an small open area secondary to skin tear to the distal part of the laceration. There is mild mucopurulent  drainage at the site.  There is mild nonpitting edema to the distal aspect of the wound that extends to above ankle.  Mild erythema and warmth.  No calf redness, swelling or tenderness.  Neurological:     General: No focal deficit present.     Mental Status: She is alert and oriented to person, place, and time.  Psychiatric:        Mood and Affect: Mood normal.        Behavior: Behavior  normal.      UC Treatments / Results  Labs (all labs ordered are listed, but only abnormal results are displayed) Labs Reviewed - No data to display  EKG   Radiology No results found.  Procedures Procedures (including critical care time)  Medications Ordered in UC Medications - No data to display  Initial Impression / Assessment and Plan / UC Course  I have reviewed the triage vital signs and the nursing notes.  Pertinent labs & imaging results that were available during my care of the patient were reviewed by me and considered in my medical decision making (see chart for details).     Wound was cleansed and redressed with bacitracin and non adherent dressing by nursing staff Discussed with patient cellulitis and need for additional antibiotics.  She does have chronic kidney  disease and an allergy to doxycycline.  CrCl is 61m/min. Due to this we will restart her Keflex twice daily for 7 days and will also start mupirocin ointment twice daily for 7 days. Advised to return to clinic in 2 days for wound check Wound care reviewed Strict ER precautions reviewed and she verbalized understanding Final Clinical Impressions(s) / UC Diagnoses   Final diagnoses:  None   Discharge Instructions   None    ED Prescriptions   None    PDMP not reviewed this encounter.   MMelynda Ripple NP 01/09/22 1128

## 2022-01-11 ENCOUNTER — Telehealth: Payer: Self-pay | Admitting: Internal Medicine

## 2022-01-11 NOTE — Telephone Encounter (Signed)
**Note De-Identified Reah Justo Obfuscation** The pt states that she currently has about a month supply of Jardiance left on hand.   She states that she was advised that by her pharmacy that a 90 day supply will cost her $183 and she states that she cannot afford that. She wants to take a less expensive medication.  I advised her that there are no generic forms of SGLT2 inhibitors currently on the market and that the only other medication that can replace Vania Rea is [place] and that it is name brand also and will cost her the same as Jardiance.  We did discuss the [employer] pt ast program for [place] and she is aware that if she applys and is approved she will receive it free of charge from them for the remainder of this year.  I gave her [place] phone number and advised her to call them to ask questions about their Jardiance asst program and her eligibility to be approved for the program.  She is aware to request that they mail her an application to her home address if advised that she is eligible and that once she receives it to complete her part of if, obtain required documents per [facility], and to bring all to Dr Dara Hoyer office to drop off and that we will take care of the providers page and will fax all to [facility].  She is also aware to call us back if she is not eligible for [employer] C[employer] asst for Jardiance.

## 2022-01-11 NOTE — Telephone Encounter (Signed)
  Pt c/o medication issue:  1. Name of Medication: empagliflozin (JARDIANCE) 10 MG TABS tablet   2. How are you currently taking this medication (dosage and times per day)? Take 1 tablet (10 mg total) by mouth daily before breakfast.   3. Are you having a reaction (difficulty breathing--STAT)? No   4. What is your medication issue? Pt said, this medication its too expensive, it cost $183. She's requesting if it can be changed for a cheaper medication

## 2022-01-12 ENCOUNTER — Encounter: Payer: Self-pay | Admitting: Internal Medicine

## 2022-01-12 ENCOUNTER — Ambulatory Visit (INDEPENDENT_AMBULATORY_CARE_PROVIDER_SITE_OTHER): Payer: Medicare Other | Admitting: Internal Medicine

## 2022-01-12 ENCOUNTER — Telehealth: Payer: Self-pay | Admitting: Internal Medicine

## 2022-01-12 VITALS — BP 112/62 | HR 62 | Temp 98.7°F | Ht 63.0 in | Wt 117.0 lb

## 2022-01-12 DIAGNOSIS — S81812D Laceration without foreign body, left lower leg, subsequent encounter: Secondary | ICD-10-CM | POA: Diagnosis not present

## 2022-01-12 NOTE — Telephone Encounter (Signed)
scheduled

## 2022-01-12 NOTE — Telephone Encounter (Signed)
LVM to CB to schedule an appointment

## 2022-01-12 NOTE — Progress Notes (Signed)
   Subjective:    Patient ID: Terri Bridges, female    DOB: [birth year], 88 y.o.   MRN: [id number]  HPI 88 year old Female with history of anxiety and likely mild dementia seen today regarding abrasion of lower leg.  She continues [place]  She has a history of varicose veins.  Her skin is frail and minor trauma seems to occur on her legs from time to time.  She was seen at the [facility] health urgent care center on [facility] on December 23 with a laceration of her left lower leg that had occurred a few hours previously.  Says she struck her leg against a chair.  She does not take anticoagulants.  Dermabond was applied to the wound.  She returned on December 26 for follow-up at the urgent care center and had dressing change.  She was placed on Keflex at the initial visit.  She could not remove the bandage as it was stuck to the wound.  Dermabond was still intact.  She had mild bruising and swelling.  Bandage was irrigated and removed.  Was advised to finish antibiotic therapy.  Wound was redressed with nonstick gauze and Coban.  She returned on January 1.  It was noted she had been prescribed Keflex twice daily for 5 days which she completed.  On January 1 she noticed redness and swelling distal to the wound that extended to her ankle and she was concerned.  Wound was cleaned and redressed with Bactroban and nonadherent dressing.  Patient was placed on Keflex again twice daily for 7 days.  Bactroban ointment was prescribed.  She is now here for follow-up.  Tetanus immunization given in 2021.  Review of Systems complaining of tinnitus in her ears since before Christmas-this is not likely due to current situation but likely otosclerosis.       Objective:   Physical Exam Vital signs reviewed.  She is afebrile.  TMs are clear with no evidence of wax or infection.  Wound on left lower leg anterior aspect is healing.  It was redressed today with Bactroban and  Telfa.  She was given Bactroban ointment today and was advised to dress wound at least once daily if not twice daily.  Advised to clean with peroxide dry carefully with sterile gauze and then apply ointment and Telfa.       Assessment & Plan:

## 2022-01-12 NOTE — Telephone Encounter (Signed)
Terri Bridges [id number]  Terri Bridges called to say she injured her left leg on 12/31/2021 and went to urgent care they closed it with glue, and gave her   cephALEXin (KEFLEX) 500 MG capsule      Sig: Take 1 capsule (500 mg total) by mouth 2 (two) times daily for 5 days.      Dispense:  10 capsule      Refill:  0  She went back on 01/03/2022 for wound check and again on 01/09/2022 for wound check and they also gave her  Keflex twice daily for 7 days and will also start mupirocin ointment twice daily for 7 days. Advised to return to clinic in 2 days for wound check. Now she would like to come in for you to look at wound to make sure it is healing right.   She also has a ringing in her ears since before Christmas.

## 2022-01-19 DIAGNOSIS — H26493 Other secondary cataract, bilateral: Secondary | ICD-10-CM | POA: Diagnosis not present

## 2022-01-19 DIAGNOSIS — H04123 Dry eye syndrome of bilateral lacrimal glands: Secondary | ICD-10-CM | POA: Diagnosis not present

## 2022-01-19 DIAGNOSIS — H524 Presbyopia: Secondary | ICD-10-CM | POA: Diagnosis not present

## 2022-01-24 ENCOUNTER — Telehealth: Payer: Self-pay

## 2022-01-24 NOTE — Telephone Encounter (Signed)
**Note De-Identified Maxson Oddo Obfuscation** The pts BI Cares pt asst application for Jardiance was left at the office with documents.  I have completed the providers page of her application and have e-mailed all to Dr Dara Hoyer nurse so she can obtain our DOD, Dr Marlou Porch signature as Dr Ali Lowe will not be in the office again until 1/25, to date it, and to fax all to [place] at the fax number written on the cover letter included.

## 2022-01-24 NOTE — Telephone Encounter (Signed)
Forms signed by Dr. Marlou Porch (DOD), scanned and faxed to [facility].

## 2022-02-03 NOTE — Telephone Encounter (Signed)
**Note De-Identified Ludivina Guymon Obfuscation** Letter received from [facility] cares stating that they denied the pt Jardiance assistance. Reason: "Coverage found"  The letter states that they have notified the t of this denial as well.

## 2022-02-08 NOTE — Patient Instructions (Addendum)
Left lower leg wound has been cleaned today, Bactroban ointment applied and wound redressed with Telfa.  No evidence of secondary bacterial infection at this time but it will take a few more days for this wound to heal.  Continue dressing wound in this fashion once or twice daily and let me know if not healing well over the next few days.  Tetanus immunization is up-to-date having been given in 2021.

## 2022-02-14 NOTE — Progress Notes (Unsigned)
Cardiology Office Note:    Date:  02/14/2022   ID:  Terri Bridges, DOB Jun 01, 1934, MRN [id number]  PCP:  Elby Showers, MD   [facility] Providers Cardiologist:  Lenna Sciara, MD Referring MD: Elby Showers, MD   Chief Complaint/Reason for Referral: Dyspnea and bifascicular block  ASSESSMENT:    1. Dilated cardiomyopathy ([place])   2. Bifascicular block   3. Stage 3 chronic kidney disease, unspecified whether stage 3a or 3b CKD ([place])   4. Hyperlipidemia LDL goal <70   5. Aortic atherosclerosis ([place])   6. Nonrheumatic mitral valve regurgitation        PLAN:    In order of problems listed above: 1.  Cardiomyopathy:  2.  Bifascicular block: . 3.  Stage III chronic kidney disease:  4.  Hyperlipidemia: Given advanced age will defer statin.   5.  Aortic atherosclerosis: Continue aspirin and defer atorvastatin. 6.  Mitral regurgitation.  Moderate, follow for now            Dispo:  No follow-ups on file.      Medication Adjustments/Labs and Tests Ordered: Current medicines are reviewed at length with the patient today.  Concerns regarding medicines are outlined above.  The following changes have been made:     Labs/tests ordered: No orders of the defined types were placed in this encounter.   Medication Changes: No orders of the defined types were placed in this encounter.    Current medicines are reviewed at length with the patient today.  The patient does not have concerns regarding medicines.   History of Present Illness:    FOCUSED PROBLEM LIST:   1.  Aortic atherosclerosis on CT scan 2019 2.  Hypothyroidism 3.  Hyperlipidemia 4.  Hypertension 5.  Bifascicular block with first-degree and left bundle branch block 6.  Bronchitis followed by pulmonology 7.  Chronic kidney disease stage III 8.  Cardiomyopathy with ejection fraction of 30 to 35% 9.  At least moderate mitral regurgitation  July 2023: Patient seen for initial consultation regarding  dyspnea and new bifascicular block.  She is noted to have a relatively low blood pressure and denied any shortness of breath.  Her biggest complaint was fatigue.  For this reason her Lasix was held for 3 days.  An echocardiogram was obtained which demonstrated a new cardiomyopathy with an ejection fraction of 30 to 35%  August 2023: The patient continues to do well.  She is able to do most of her activities of daily living without any significant shortness of breath, presyncope, syncope, or chest pain.  She has had some peripheral edema but this seems to be related to cellulitis rather than heart failure symptoms.  She does sleep on 2 pillows and this has been a chronic practice for her stemming from chronic back pain issues.  She fortunately has not required any emergent C room visits or hospitalizations for cardiovascular issues.  Plan: Start Jardiance 10 mg daily, continue losartan, stop amlodipine, and start Toprol-XL 25 mg daily; obtain coronary CTA to characterize cardiomyopathy given patient's reluctance for invasive procedures.  October 2023: The patient is here for expedited follow-up.  She has had issues with her medical therapy given the multiple new medication changes that were instituted at her last visit.  In the interim the patient had her Toprol decreased to half a pill because of some lightheadedness.  She is taking her Toprol, losartan, and spironolactone in the morning.  She also takes her Lasix in the  morning as well as her Jardiance.  She feels like her breathing has been relatively stable.  She definitely cannot do as much as she could do about a year ago.  She denies any chest pain, syncope, significant peripheral edema, or paroxysmal nocturnal dyspnea.  Plan: Change Toprol-XL to 25 mg at bedtime, change losartan to 100 mg at bedtime and spironolactone to 25 mg at bedtime.  Obtain coronary CTA.  Today: In the interim a coronary CTA was not performed.  She was seen by her pulmonologist in  November and was doing well.   Current Medications: No outpatient medications have been marked as taking for the 02/20/22 encounter (Appointment) with Early Osmond, MD.     Allergies:    Doxycycline   Social History:   Social History   Tobacco Use   Smoking status: Former    Packs/day: 1.00    Years: 10.00    Total pack years: 10.00    Types: Cigarettes    Quit date: 03/30/1991    Years since quitting: 30.9   Smokeless tobacco: Never   Tobacco comments:    quit smoking in Mar 1993  Substance Use Topics   Alcohol use: No    Alcohol/week: 0.0 standard drinks of alcohol   Drug use: No     Family Hx: Family History  Problem Relation Age of Onset   Heart failure Mother    Heart failure Father    Hypertension Sister    Neuropathy Neg Hx      Review of Systems:   Please see the history of present illness.    All other systems reviewed and are negative.     EKGs/Labs/Other Test Reviewed:    EKG:  EKG performed July 2023 that I personally reviewed demonstrates sinus rhythm with first-degree AV block and left bundle branch block.  Prior CV studies:  TTE 2023: Ejection fraction of 30 to 35% with mild to moderate mitral regurgitation  30-day monitor 2023: Predominantly sinus rhythm with no atrial fibrillation, sustained ventricular tachyarrhythmias, or bradycardia arrhythmias with occasional nonsustained ventricular tachycardia, PVCs, and PACs.  Other studies Reviewed: Review of the additional studies/records demonstrates: CT abdomen pelvis 2019 with aortoiliac atherosclerosis  Recent Labs: 07/15/2021: Brain Natriuretic Peptide 853 09/20/2021: ALT 48; Hemoglobin 12.8; Platelets 163; TSH 2.77 11/03/2021: BUN 24; Creatinine, Ser 1.74; Potassium 4.9; Sodium 143   Recent Lipid Panel Lab Results  Component Value Date/Time   CHOL 174 09/20/2021 09:11 AM   TRIG 78 09/20/2021 09:11 AM   HDL 60 09/20/2021 09:11 AM   LDLCALC 97 09/20/2021 09:11 AM    Risk  Assessment/Calculations:           Physical Exam:    VS:  There were no vitals taken for this visit.   Wt Readings from Last 3 Encounters:  01/12/22 117 lb (53.1 kg)  11/10/21 127 lb (57.6 kg)  11/03/21 129 lb 6.4 oz (58.7 kg)    GENERAL:  No apparent distress, AOx3 HEENT:  No carotid bruits, +2 carotid impulses, no scleral icterus; dry mucous membranes CAR: RRR with ectopy no murmurs, gallops, rubs, or thrills RES:  Clear to auscultation bilaterally ABD:  Soft, nontender, nondistended, positive bowel sounds x 4 VASC:  +2 radial pulses, +2 carotid pulses, palpable pedal pulses NEURO:  CN 2-12 grossly intact; motor and sensory grossly intact PSYCH:  No active depression or anxiety EXT:  No edema, + ecchymosis, or cyanosis  Signed, Early Osmond, MD  02/14/2022 12:55 PM  [facility] [place], [place], [place]  [address] Phone: [phone]; Fax: [phone]   Note:  This document was prepared using Dragon voice recognition software and may include unintentional dictation errors.

## 2022-02-15 ENCOUNTER — Other Ambulatory Visit: Payer: Self-pay | Admitting: Internal Medicine

## 2022-02-20 ENCOUNTER — Ambulatory Visit: Payer: Medicare Other | Attending: Internal Medicine | Admitting: Internal Medicine

## 2022-02-20 ENCOUNTER — Encounter: Payer: Self-pay | Admitting: Internal Medicine

## 2022-02-20 ENCOUNTER — Ambulatory Visit (HOSPITAL_BASED_OUTPATIENT_CLINIC_OR_DEPARTMENT_OTHER): Payer: Medicare Other

## 2022-02-20 VITALS — BP 138/58 | HR 60 | Ht 63.0 in | Wt 124.4 lb

## 2022-02-20 DIAGNOSIS — R0600 Dyspnea, unspecified: Secondary | ICD-10-CM

## 2022-02-20 DIAGNOSIS — N183 Chronic kidney disease, stage 3 unspecified: Secondary | ICD-10-CM

## 2022-02-20 DIAGNOSIS — I340 Nonrheumatic mitral (valve) insufficiency: Secondary | ICD-10-CM | POA: Insufficient documentation

## 2022-02-20 DIAGNOSIS — E785 Hyperlipidemia, unspecified: Secondary | ICD-10-CM

## 2022-02-20 DIAGNOSIS — I700 Atherosclerosis of aorta: Secondary | ICD-10-CM | POA: Diagnosis not present

## 2022-02-20 DIAGNOSIS — I10 Essential (primary) hypertension: Secondary | ICD-10-CM

## 2022-02-20 DIAGNOSIS — I420 Dilated cardiomyopathy: Secondary | ICD-10-CM

## 2022-02-20 DIAGNOSIS — I452 Bifascicular block: Secondary | ICD-10-CM | POA: Insufficient documentation

## 2022-02-20 LAB — ECHOCARDIOGRAM COMPLETE
Area-P 1/2: 5.16 cm2
S' Lateral: 4.45 cm

## 2022-02-20 MED ORDER — EMPAGLIFLOZIN 25 MG PO TABS: 25.0000 mg | ORAL_TABLET | Freq: Every day | ORAL | 3 refills | Status: DC

## 2022-02-20 NOTE — Patient Instructions (Signed)
Medication Instructions:  Your physician has recommended you make the following change in your medication:   1) INCREASE Jardiance to 37m once daily  *If you need a refill on your cardiac medications before your next appointment, please call your pharmacy*  Lab Work: None  Testing/Procedures: None  Follow-Up: At [facility] you and your health needs are our priority.  As part of our continuing mission to provide you with exceptional heart care, we have created designated Provider Care Teams.  These Care Teams include your primary Cardiologist (physician) and Advanced Practice Providers (APPs -  Physician Assistants and Nurse Practitioners) who all work together to provide you with the care you need, when you need it.  Your next appointment:   6 month(s)  Provider:   TNicholes Rough PA-C, MChristen Bame NP, or SRichardson Dopp PA-C

## 2022-03-06 ENCOUNTER — Other Ambulatory Visit: Payer: Self-pay | Admitting: Internal Medicine

## 2022-03-13 ENCOUNTER — Other Ambulatory Visit: Payer: Self-pay

## 2022-03-13 MED ORDER — IBANDRONATE SODIUM 150 MG PO TABS: 150.0000 mg | ORAL_TABLET | ORAL | 11 refills | Status: DC

## 2022-03-14 ENCOUNTER — Other Ambulatory Visit: Payer: Self-pay | Admitting: Internal Medicine

## 2022-03-21 ENCOUNTER — Other Ambulatory Visit: Payer: Medicare Other

## 2022-03-21 DIAGNOSIS — Z13220 Encounter for screening for lipoid disorders: Secondary | ICD-10-CM

## 2022-03-21 DIAGNOSIS — I10 Essential (primary) hypertension: Secondary | ICD-10-CM | POA: Diagnosis not present

## 2022-03-21 DIAGNOSIS — E039 Hypothyroidism, unspecified: Secondary | ICD-10-CM

## 2022-03-22 LAB — LIPID PANEL
Cholesterol: 183 mg/dL (ref <200)
HDL: 71 mg/dL (ref >=50)
LDL Cholesterol (Calc): 95 mg/dL (calc)
Non-HDL Cholesterol (Calc): 112 mg/dL (calc) (ref <130)
Total CHOL/HDL Ratio: 2.6 (calc) (ref <5.0)
Triglycerides: 83 mg/dL (ref <150)

## 2022-03-22 LAB — CBC WITH DIFFERENTIAL/PLATELET
Absolute Monocytes: 420 cells/uL (ref 200–950)
Basophils Absolute: 22 cells/uL (ref 0–200)
Basophils Relative: 0.4 %
Eosinophils Absolute: 140 cells/uL (ref 15–500)
Eosinophils Relative: 2.5 %
HCT: 42.3 % (ref 35.0–45.0)
Hemoglobin: 13.6 g/dL (ref 11.7–15.5)
Lymphs Abs: 1786 cells/uL (ref 850–3900)
MCH: 28.2 pg (ref 27.0–33.0)
MCHC: 32.2 g/dL (ref 32.0–36.0)
MCV: 87.6 fL (ref 80.0–100.0)
MPV: 10.3 fL (ref 7.5–12.5)
Monocytes Relative: 7.5 %
Neutro Abs: 3231 cells/uL (ref 1500–7800)
Neutrophils Relative %: 57.7 %
Platelets: 177 10*3/uL (ref 140–400)
RBC: 4.83 10*6/uL (ref 3.80–5.10)
RDW: 14.1 % (ref 11.0–15.0)
Total Lymphocyte: 31.9 %
WBC: 5.6 10*3/uL (ref 3.8–10.8)

## 2022-03-22 LAB — COMPLETE METABOLIC PANEL WITH GFR
AG Ratio: 1.8 (calc) (ref 1.0–2.5)
ALT: 14 U/L (ref 6–29)
AST: 21 U/L (ref 10–35)
Albumin: 4.2 g/dL (ref 3.6–5.1)
Alkaline phosphatase (APISO): 50 U/L (ref 37–153)
BUN/Creatinine Ratio: 35 (calc) — ABNORMAL HIGH (ref 6–22)
BUN: 62 mg/dL — ABNORMAL HIGH (ref 7–25)
CO2: 26 mmol/L (ref 20–32)
Calcium: 9.2 mg/dL (ref 8.6–10.4)
Chloride: 106 mmol/L (ref 98–110)
Creat: 1.78 mg/dL — ABNORMAL HIGH (ref 0.60–0.95)
Globulin: 2.4 g/dL (calc) (ref 1.9–3.7)
Glucose, Bld: 80 mg/dL (ref 65–99)
Potassium: 5.2 mmol/L (ref 3.5–5.3)
Sodium: 141 mmol/L (ref 135–146)
Total Bilirubin: 0.5 mg/dL (ref 0.2–1.2)
Total Protein: 6.6 g/dL (ref 6.1–8.1)
eGFR: 28 mL/min/{1.73_m2} — ABNORMAL LOW (ref >=60)

## 2022-03-22 LAB — TSH: TSH: 1.27 mIU/L (ref 0.40–4.50)

## 2022-03-23 NOTE — Progress Notes (Signed)
Annual Wellness Visit    Patient Care Team: Terri Bridges, Terri Lick, MD as PCP - General (Internal Medicine) Terri Osmond, MD as PCP - Cardiology (Cardiology)  Visit Date: 03/30/22   Chief Complaint  Patient presents with   Medicare Wellness    Subjective:   Patient: Terri Bridges, Female    DOB: [birth year], 88 y.o.   MRN: [id number]  Terri Bridges is a 88 y.o. Female who presents today for her Annual Wellness Visit. She has a history of asthma, allergic rhinitis, anxiety, arthritis, chronic lower back pain, constipation, depression, dry eyes, exertional shortness of breath, GERD, hiatal hernia, headache, heart murmur, bronchitis, hypertension, hypothyroidism, joint pain, joint swelling, nocturia, osteoporosis, pressure chest, tinnitus, scoliosis, urinary frequency, incontinence, urgency, Stage 3b kidney failure, COPD, restrictive lung disease.  History of stasis dermatitis from varicose veins. Has a new red spot on her left lower leg. She has been using hot compresses on this.  History of hypertension treated with Cozaar 100 mg daily at bedtime, Toprol XL 25 mg daily at bedtime. Blood pressure normal today at 98/52.  History of hypothyroidism treated with Synthroid 100 mcg daily. TSH normal at 1.27 on 03/21/22.  History of anxiety treated with Xanax 0.5 mg daily at bedtime.  History of COPD and restrictive lung disease. Followed by pulmonary. Has not been using inhalers. Uses nebulizer with albuterol.   History of heart failure treated with Lasix 20 mg daily, Aldactone 25 mg daily at bedtime. Followed by [facility]. She was there not long ago and felt to be doing fairly well. History of Stage 3b chronic kidney failure treated with Jardiance 25 mg daily. Creatinine at 1.78 on 03/21/22, up from 1.74 on 11/03/21. This is probably related to her heart failure, NSAID use, longstanding history of hypertension.  History of left knee arthroplasty by Dr. Lorre Nick in 2015 and had  revision of surgery 4 months later due to instability. She has been scheduled to have right knee arthroplasty in the remote past but has postponed it.  In 1991 she had sinus surgery by Dr. Lorella Nimrod. Please see dictation January 21, 2015. Subsequently suffered a periorbital abscess which was drained by right intranasal ethmoidectomy in March 1993. Also had ethmoidectomy, left sphenoidectomy and transnasal frontal sinusotomy March 1993.  An arthroscopic surgery was done on her right knee in 2004. Bilateral grommet tubes placed February 2004. Left cataract extraction 2013. Right rotator cuff repair 2011. In 2008 she had colonoscopy done by Dr. Oletta Lamas and upper endoscopy which showed hiatal hernia.  History of asthma and allergic rhinitis treated by Dr. Neldon Mc.  Had exercise tolerance test to evaluate chest pain with Dr. Roselie Skinner in November 1998. Study was negative. Dr. Verl Blalock saw her in 2005 for palpitations and placed her on Cardizem with improvement in blood pressure and palpitations. She had cardiac cath at that time showing no significant coronary disease.  History of osteoporosis treated with Boniva 150 mg every 30 days. DEXA scan last completed 03/07/17. T-score at -3.0 at total left femur.  Because of GE reflux it was not likely she would tolerate Fosamax. She took Actonel for a while in 2002 but discontinued it. Treated with Prilosec 20 mg daily.  She is a former heavy smoker having smoked 1 to 2 packs of cigarettes daily for 17 years. She quit smoking in 1993.  She suffered a superficial laceration right anterior lower leg in 2021. Laceration was treated with Steri-Strips with no evidence of secondary infection. Tetanus update  was given in August 2021. She had both Prevnar 13 and pneumococcal 23 vaccines. Gets annual flu vaccine. Records indicates she has had 3 Moderna vaccines for COVID.   Lipid panel normal, CBC normal.   AST normal at 21, ALT at 14 on 03/21/22, down from 37, 48 on  09/20/21.  Pelvic exam deferred.  Mammogram last completed 03/07/17. No mammographic evidence of malignancy. Recommended repeat in 2020.   Past Medical History:  Diagnosis Date   Allergic rhinitis    Allergy    uses Flonase daily as needed   Anxiety    takes Xanax daily as needed   Arthritis    "back" (04/21/2013)   Chronic lower back pain    Constipation    takes an OTC stool softener   Depression    takes Zoloft daily   Dry eyes    uses eye drops   Exertional shortness of breath    GERD (gastroesophageal reflux disease)    takes Omeprazole daily and Protonix daily as needed   H/O hiatal hernia    Headache(784.0)    sinus   Heart murmur    years ago   History of bronchitis    last time many yrs ago   Hypertension    takes Losartan and Cardizem daily   Hypothyroidism    takes Synthroid daily   Joint pain    Joint swelling    Nocturia    Osteoporosis    Pressure in chest 08/23/13   fell and started having chest pressure, will have ECHO and stress test 09/04/13 before surgery   Ringing in ears    sees Dr.Byers for this   Scoliosis    Urinary frequency    Urinary incontinence    Urinary urgency      Family History  Problem Relation Age of Onset   Heart failure Mother    Heart failure Father    Hypertension Sister    Neuropathy Neg Hx      Social History   Social History Narrative   Originally from [place].    Previously worked in Engineer, mining for [employer]    Has 2 dogs currently.    Remote exposure to parrots in her current home.    No known mold exposure.    Remote travel to [place].    Has 1 indoor plant.    Caffeine use: No soda   2 cups coffee every morning     Review of Systems  Constitutional:  Negative for chills, fever, malaise/fatigue and weight loss.  HENT:  Negative for hearing loss, sinus pain and sore throat.   Respiratory:  Negative for cough, hemoptysis and shortness of breath.   Cardiovascular:  Negative for chest pain, palpitations, leg  swelling and PND.  Gastrointestinal:  Negative for abdominal pain, constipation, diarrhea, heartburn, nausea and vomiting.  Genitourinary:  Negative for dysuria, frequency and urgency.  Musculoskeletal:  Negative for back pain, myalgias and neck pain.  Skin:  Negative for itching and rash.       (+) Red spot on left lower leg  Neurological:  Negative for dizziness, tingling, seizures and headaches.  Endo/Heme/Allergies:  Negative for polydipsia.  Psychiatric/Behavioral:  Negative for depression. The patient is not nervous/anxious.       Objective:   Vitals: BP (!) 98/52   Pulse 61   Temp 98.5 F (36.9 C) (Tympanic)   Ht 4\' 11"  (1.499 m)   Wt 123 lb 12.8 oz (56.2 kg)   SpO2 94%  BMI 25.00 kg/m   Physical Exam Vitals and nursing note reviewed.  Constitutional:      General: She is not in acute distress.    Appearance: Normal appearance. She is not ill-appearing or toxic-appearing.  HENT:     Head: Normocephalic and atraumatic.     Right Ear: Hearing, tympanic membrane, ear canal and external ear normal.     Left Ear: Hearing, tympanic membrane, ear canal and external ear normal.     Mouth/Throat:     Pharynx: Oropharynx is clear.  Eyes:     Extraocular Movements: Extraocular movements intact.     Pupils: Pupils are equal, round, and reactive to light.  Neck:     Thyroid: No thyroid mass, thyromegaly or thyroid tenderness.     Vascular: No carotid bruit.  Cardiovascular:     Rate and Rhythm: Normal rate and regular rhythm. No extrasystoles are present.    Pulses:          Dorsalis pedis pulses are 0 on the right side and 0 on the left side.       Posterior tibial pulses are 0 on the right side and 0 on the left side.     Heart sounds: Normal heart sounds. No murmur heard.    No friction rub. No gallop.  Pulmonary:     Effort: Pulmonary effort is normal.     Breath sounds: Normal breath sounds. No decreased breath sounds, wheezing, rhonchi or rales.  Chest:     Chest  wall: No mass.  Abdominal:     Palpations: Abdomen is soft. There is no hepatomegaly, splenomegaly or mass.     Tenderness: There is no abdominal tenderness.     Hernia: No hernia is present.  Musculoskeletal:     Cervical back: Normal range of motion.     Right lower leg: No edema.     Left lower leg: No edema.     Comments: Kyphosis present.  Feet:     Comments: Onychomycosis on all toes bilaterally. Lymphadenopathy:     Cervical: No cervical adenopathy.     Upper Body:     Right upper body: No supraclavicular adenopathy.     Left upper body: No supraclavicular adenopathy.  Skin:    General: Skin is warm and dry.     Comments: Dime-sized area of erythema with an abraded area in the center that is located 1/3 down the left lower leg to the left of the tibia. Stasis dermatitis, hemosiderin deposits on bilateral legs and arms. Senile purpura on right elbow and left forearm.  Neurological:     General: No focal deficit present.     Mental Status: She is alert and oriented to person, place, and time. Mental status is at baseline.     Sensory: Sensation is intact.     Motor: Motor function is intact. No weakness.     Deep Tendon Reflexes: Reflexes are normal and symmetric.  Psychiatric:        Attention and Perception: Attention normal.        Mood and Affect: Mood normal.        Speech: Speech normal.        Behavior: Behavior normal.        Thought Content: Thought content normal.        Cognition and Memory: Cognition normal.        Judgment: Judgment normal.      Most recent functional status assessment:    03/30/2022  10:21 AM  In your present state of health, do you have any difficulty performing the following activities:  Hearing? 0  Vision? 0  Difficulty concentrating or making decisions? 0  Walking or climbing stairs? 0  Dressing or bathing? 0  Doing errands, shopping? 0  Preparing Food and eating ? N  Using the Toilet? N  In the past six months, have you  accidently leaked urine? N  Do you have problems with loss of bowel control? N  Managing your Medications? N  Managing your Finances? N  Housekeeping or managing your Housekeeping? N   Most recent fall risk assessment:    03/30/2022   10:20 AM  Fall Risk   Falls in the past year? 1  Number falls in past yr: 1  Injury with Fall? 1  Risk for fall due to : Impaired mobility  Follow up Falls prevention discussed    Most recent depression screenings:    03/30/2022   10:20 AM 01/12/2022    3:57 PM  PHQ 2/9 Scores  PHQ - 2 Score 0 0   Most recent cognitive screening:    03/30/2022   10:23 AM  6CIT Screen  What Year? 0 points  What month? 0 points  What time? 0 points  Count back from 20 0 points  Months in reverse 0 points  Repeat phrase 0 points  Total Score 0 points     Results:   Studies obtained and personally reviewed by me:  Mammogram last completed 03/07/17. No mammographic evidence of malignancy. Recommended repeat in 2020.  DEXA scan last completed 03/07/17. T-score at -3.0 at total left femur.   Labs:       Component Value Date/Time   NA 141 03/21/2022 0910   NA 143 11/03/2021 1207   K 5.2 03/21/2022 0910   CL 106 03/21/2022 0910   CO2 26 03/21/2022 0910   GLUCOSE 80 03/21/2022 0910   BUN 62 (H) 03/21/2022 0910   BUN 24 11/03/2021 1207   CREATININE 1.78 (H) 03/21/2022 0910   CALCIUM 9.2 03/21/2022 0910   PROT 6.6 03/21/2022 0910   ALBUMIN 3.7 01/20/2016 1027   AST 21 03/21/2022 0910   ALT 14 03/21/2022 0910   ALKPHOS 67 01/20/2016 1027   BILITOT 0.5 03/21/2022 0910   GFRNONAA 34 (L) 03/18/2020 1209   GFRAA 40 (L) 03/18/2020 1209     Lab Results  Component Value Date   WBC 5.6 03/21/2022   HGB 13.6 03/21/2022   HCT 42.3 03/21/2022   MCV 87.6 03/21/2022   PLT 177 03/21/2022    Lab Results  Component Value Date   CHOL 183 03/21/2022   HDL 71 03/21/2022   LDLCALC 95 03/21/2022   TRIG 83 03/21/2022   CHOLHDL 2.6 03/21/2022    No  results found for: "HGBA1C"   Lab Results  Component Value Date   TSH 1.27 03/21/2022    Assessment & Plan:   Area of Terri cellulitis left lower leg: apply Bactroban 2% twice daily. Prescribed Keflex 500 mg twice daily for 7 days.  Hypertension: treated with Cozaar 100 mg daily at bedtime, Toprol XL 25 mg daily at bedtime. Blood pressure normal today at 98/52.  Hypothyroidism: treated with Synthroid 100 mcg daily. TSH normal at 1.27 on 03/21/22.  Anxiety: treated with Xanax 0.5 mg daily at bedtime.  COPD and restrictive lung disease: Followed by pulmonary. Has not been using inhalers. Uses nebulizer with albuterol. Asthma and allergic rhinitis treated by Dr. Neldon Mc.  Heart  failure: treated with Lasix 20 mg daily, Aldactone 25 mg daily at bedtime. Followed by [facility]. She was there not long ago and felt to be doing fairly well. History of Stage 3b chronic kidney failure treated with Jardiance 25 mg daily. Creatinine at 1.78 on 03/21/22, up from 1.74 on 11/03/21. This is probably related to her heart failure, NSAID use, longstanding history of hypertension.  Osteoporosis: treated with Boniva 150 mg every 30 days. DEXA scan last completed 03/07/17. T-score at -3.0 at total left femur.  GERD: treated with Prilosec 20 mg daily.  Mild elevation of AST/ALT: AST normal at 21, ALT at 14 on 03/21/22, down from 37, 48 on 09/20/21.  Pelvic exam deferred.  CKD stage 4- declines renal evaluation  Mammogram: last completed 03/07/17. No mammographic evidence of malignancy. Recommended repeat in 2020.  Vaccine counseling: Pneumococcal 20 vaccine administered. She will be due for flu and Covid-19 this fall. UTD on tetanus vaccine.  Health maintenance: A1c collected.  Return in 6 months for health maintenance exam.     Annual wellness visit done today including the all of the following: Reviewed patient's Family Medical History Reviewed and updated list of patient's medical  providers Assessment of cognitive impairment was done Assessed patient's functional ability Established a written schedule for health screening [place] Completed and Reviewed  Discussed health benefits of physical activity, and encouraged her to engage in regular exercise appropriate for her age and condition.        I,Alexander Ruley,acting as a Education administrator for Elby Showers, MD.,have documented all relevant documentation on the behalf of Elby Showers, MD,as directed by  Elby Showers, MD while in the presence of Elby Showers, MD.   I, Elby Showers, MD, have reviewed all documentation for this visit. The documentation on 04/05/22 for the exam, diagnosis, procedures, and orders are all accurate and complete.

## 2022-03-30 ENCOUNTER — Ambulatory Visit (INDEPENDENT_AMBULATORY_CARE_PROVIDER_SITE_OTHER): Payer: Medicare Other | Admitting: Internal Medicine

## 2022-03-30 ENCOUNTER — Ambulatory Visit: Payer: Medicare Other

## 2022-03-30 ENCOUNTER — Encounter: Payer: Self-pay | Admitting: Internal Medicine

## 2022-03-30 VITALS — BP 98/52 | HR 61 | Temp 98.5°F | Ht 59.0 in | Wt 123.8 lb

## 2022-03-30 DIAGNOSIS — Z8709 Personal history of other diseases of the respiratory system: Secondary | ICD-10-CM

## 2022-03-30 DIAGNOSIS — N184 Chronic kidney disease, stage 4 (severe): Secondary | ICD-10-CM

## 2022-03-30 DIAGNOSIS — K4090 Unilateral inguinal hernia, without obstruction or gangrene, not specified as recurrent: Secondary | ICD-10-CM

## 2022-03-30 DIAGNOSIS — F419 Anxiety disorder, unspecified: Secondary | ICD-10-CM

## 2022-03-30 DIAGNOSIS — F32A Depression, unspecified: Secondary | ICD-10-CM | POA: Diagnosis not present

## 2022-03-30 DIAGNOSIS — F409 Phobic anxiety disorder, unspecified: Secondary | ICD-10-CM

## 2022-03-30 DIAGNOSIS — Z0000 Encounter for general adult medical examination without abnormal findings: Secondary | ICD-10-CM

## 2022-03-30 DIAGNOSIS — I340 Nonrheumatic mitral (valve) insufficiency: Secondary | ICD-10-CM

## 2022-03-30 DIAGNOSIS — R7302 Impaired glucose tolerance (oral): Secondary | ICD-10-CM

## 2022-03-30 DIAGNOSIS — F5105 Insomnia due to other mental disorder: Secondary | ICD-10-CM

## 2022-03-30 DIAGNOSIS — I5023 Acute on chronic systolic (congestive) heart failure: Secondary | ICD-10-CM | POA: Diagnosis not present

## 2022-03-30 DIAGNOSIS — Z23 Encounter for immunization: Secondary | ICD-10-CM

## 2022-03-30 DIAGNOSIS — I8393 Asymptomatic varicose veins of bilateral lower extremities: Secondary | ICD-10-CM | POA: Diagnosis not present

## 2022-03-30 DIAGNOSIS — I10 Essential (primary) hypertension: Secondary | ICD-10-CM

## 2022-03-30 DIAGNOSIS — M1711 Unilateral primary osteoarthritis, right knee: Secondary | ICD-10-CM | POA: Diagnosis not present

## 2022-03-30 LAB — POCT URINALYSIS DIPSTICK
Bilirubin, UA: NEGATIVE
Blood, UA: NEGATIVE
Glucose, UA: NEGATIVE
Ketones, UA: NEGATIVE
Leukocytes, UA: NEGATIVE
Nitrite, UA: NEGATIVE
Protein, UA: NEGATIVE
Spec Grav, UA: 1.01 (ref 1.010–1.025)
Urobilinogen, UA: 0.2 E.U./dL
pH, UA: 5 (ref 5.0–8.0)

## 2022-03-30 MED ORDER — CEPHALEXIN 500 MG PO CAPS: 500.0000 mg | ORAL_CAPSULE | Freq: Two times a day (BID) | ORAL | 0 refills | Status: DC

## 2022-03-31 LAB — HEMOGLOBIN A1C
Hgb A1c MFr Bld: 5.7 % of total Hgb — ABNORMAL HIGH (ref <5.7)
Mean Plasma Glucose: 117 mg/dL
eAG (mmol/L): 6.5 mmol/L

## 2022-04-05 NOTE — Patient Instructions (Addendum)
Continue current meds and close Cardiology follow up. Pneumococcal 20 vaccine given today. Has area of cellulitis left leg. Please use Bactroban ointment and take Keflex twice daily for 7 days. RTC in 6 months

## 2022-04-11 ENCOUNTER — Other Ambulatory Visit: Payer: Self-pay

## 2022-04-11 MED ORDER — IBANDRONATE SODIUM 150 MG PO TABS: 150.0000 mg | ORAL_TABLET | ORAL | 11 refills | Status: DC

## 2022-05-17 ENCOUNTER — Other Ambulatory Visit: Payer: Self-pay | Admitting: Internal Medicine

## 2022-06-06 ENCOUNTER — Telehealth: Payer: Self-pay | Admitting: Pharmacist

## 2022-06-06 NOTE — Telephone Encounter (Signed)
Patient called stating her shipment from [facility] cares did come so she got a fill of Jardiance at [facility]. They gave her 25mg  tablets, but supposed to be on 10mg . Wondering if that will hurt her. Advised that it will be fine. Go ahead and take 1/2 tablet (12.5mg ) until her shipment from [facility] cares comes or she runs out.

## 2022-06-19 ENCOUNTER — Other Ambulatory Visit: Payer: Self-pay | Admitting: Internal Medicine

## 2022-06-30 ENCOUNTER — Telehealth: Payer: Self-pay | Admitting: Internal Medicine

## 2022-06-30 NOTE — Telephone Encounter (Signed)
Rivka Peatross [phone]  Terri Bridges called to say she has been trouble swallowing for the last week or so, she feels like it may be some swollen glands under her chin, wanted to see if she could come in next week. She had trouble with her oatmeal this morning, and sometimes water, its like it gets stuck in her throat.

## 2022-07-03 NOTE — Telephone Encounter (Signed)
Left message for patient to return call to office at [phone].  

## 2022-07-04 NOTE — Telephone Encounter (Signed)
Patient called back and I let her know what Dr Lenord Fellers suggested and she said she would think about doing a endoscopy and call us back

## 2022-07-05 ENCOUNTER — Encounter: Payer: Self-pay | Admitting: Internal Medicine

## 2022-07-07 ENCOUNTER — Ambulatory Visit (HOSPITAL_COMMUNITY)
Admission: EM | Admit: 2022-07-07 | Discharge: 2022-07-07 | Disposition: A | Discharge status: Home / Self Care | Payer: Medicare Other

## 2022-07-07 ENCOUNTER — Encounter (HOSPITAL_COMMUNITY): Payer: Self-pay

## 2022-07-07 DIAGNOSIS — S41111A Laceration without foreign body of right upper arm, initial encounter: Secondary | ICD-10-CM

## 2022-07-07 NOTE — Discharge Instructions (Addendum)
Monitor for any signs of infection such as; increasing pain, spreading redness, drainage from the wound, increased swelling. Return if you notice these symptoms.  Do NOT soak the wound in any water. Keep the area clean and dry. Do not apply any ointments over the stickers. The stickers will fall off on their own in a few days. Do not remove them yourself. Change the gauze at least once daily. Take tylenol for pain/discomfort.

## 2022-07-07 NOTE — ED Provider Notes (Signed)
[facility]    CSN: [id number] Arrival date & time: 07/07/22  1215     History   Chief Complaint Chief Complaint  Patient presents with   Extremity Laceration    HPI Terri Bridges is a 88 y.o. female.  About an hour ago she was getting out of her car, door caught on her arm. Reports skin has torn on right upper arm. Minimal bleeding, pain only when she touches the wound.  She does not take a blood thinner  History of several skin tears over the last 1-2 years. Has several falls and injuries  Tetanus is UTD  Past Medical History:  Diagnosis Date   Allergic rhinitis    Allergy    uses Flonase daily as needed   Anxiety    takes Xanax daily as needed   Arthritis    "back" (04/21/2013)   Chronic lower back pain    Constipation    takes an OTC stool softener   Depression    takes Zoloft daily   Dry eyes    uses eye drops   Exertional shortness of breath    GERD (gastroesophageal reflux disease)    takes Omeprazole daily and Protonix daily as needed   H/O hiatal hernia    Headache(784.0)    sinus   Heart murmur    years ago   History of bronchitis    last time many yrs ago   Hypertension    takes Losartan and Cardizem daily   Hypothyroidism    takes Synthroid daily   Joint pain    Joint swelling    Nocturia    Osteoporosis    Pressure in chest 08/23/13   fell and started having chest pressure, will have ECHO and stress test 09/04/13 before surgery   Ringing in ears    sees Dr.Byers for this   Scoliosis    Urinary frequency    Urinary incontinence    Urinary urgency     Patient Active Problem List   Diagnosis Date Noted   HFrEF (heart failure with reduced ejection fraction) (HCC) 10/10/2021   Asthma 03/26/2018   Left inguinal hernia 08/05/2017   Osteoarthritis of right knee 08/05/2017   Chronic pansinusitis 05/31/2016   Eustachian tube dysfunction, bilateral 05/31/2016   Sensorineural hearing loss (SNHL), bilateral 05/31/2016   Carpal  tunnel syndrome 05/12/2015   Degenerative arthritis of finger 05/12/2015   Primary arthrosis of first carpometacarpal joints, bilateral 05/12/2015   Chronic kidney disease 05/22/2014   Left knee pain 09/08/2013   H/O total knee replacement 08/11/2013   S/P total knee arthroplasty 04/21/2013   Gonalgia 04/11/2013   Osteoporosis, unspecified 03/24/2012   At high risk for falls 02/12/2012   Leg pain, bilateral 02/12/2012   Anxiety 03/06/2011   COPD (chronic obstructive pulmonary disease) (HCC) 01/08/2011   Hypertension 01/05/2011   Hypothyroidism 01/05/2011   Recurrent sinusitis 01/05/2011   Allergic rhinitis 01/05/2011   Depression 01/05/2011   GE reflux 01/05/2011    Past Surgical History:  Procedure Laterality Date   CARDIAC CATHETERIZATION  2005   CATARACT EXTRACTION W/ INTRAOCULAR LENS  IMPLANT, BILATERAL Bilateral    COLONOSCOPY     ESOPHAGOGASTRODUODENOSCOPY     EXCISIONAL TOTAL KNEE ARTHROPLASTY Left 09/08/2013   Procedure: EXCISIONAL TOTAL KNEE ARTHROPLASTY POLYEXCHANGE;  Surgeon: Dannielle Huh, MD;  Location: [facility];  Service: Orthopedics;  Laterality: Left;   EYE SURGERY     JOINT REPLACEMENT     KNEE ARTHROSCOPY Right  NASAL SINUS SURGERY     x 4   SHOULDER ARTHROSCOPY W/ ROTATOR CUFF REPAIR Right    TONSILLECTOMY  1940's   TOTAL KNEE ARTHROPLASTY Left 04/21/2013   TOTAL KNEE ARTHROPLASTY Left 04/21/2013   Procedure: TOTAL KNEE ARTHROPLASTY;  Surgeon: Dannielle Huh, MD;  Location: [facility];  Service: Orthopedics;  Laterality: Left;   TUBAL LIGATION      OB History   No obstetric history on file.      Home Medications    Prior to Admission medications   Medication Sig Start Date End Date Taking? Authorizing Provider  ALPRAZolam Prudy Feeler) 0.5 MG tablet TAKE 1 TABLET BY MOUTH AT BEDTIME 05/18/22  Yes Baxley, Luanna Cole, MD  amLODipine (NORVASC) 5 MG tablet Take by mouth. 08/11/13  Yes [provider]  aspirin EC 81 MG tablet Take 1 tablet (81 mg total) by mouth  daily. Swallow whole. 07/22/21  Yes Orbie Pyo, MD  Budeson-Glycopyrrol-Formoterol (BREZTRI AEROSPHERE) 160-9-4.8 MCG/ACT AERO Inhale 2 puffs into the lungs 2 (two) times daily.   Yes [provider]  cephALEXin (KEFLEX) 500 MG capsule Take 1 capsule (500 mg total) by mouth 2 (two) times daily. 03/30/22  Yes Baxley, Luanna Cole, MD  empagliflozin (JARDIANCE) 10 MG TABS tablet Take 1 tablet (10 mg total) by mouth daily before breakfast. 06/06/22  Yes Orbie Pyo, MD  furosemide (LASIX) 20 MG tablet Take 1 tablet by mouth once daily 03/06/22  Yes Baxley, Luanna Cole, MD  ibandronate (BONIVA) 150 MG tablet Take 1 tablet (150 mg total) by mouth every 30 (thirty) days. Take in the morning with a full glass of water, on an empty stomach, and do not take anything else by mouth or lie down for the next 30 min. 04/11/22  Yes Baxley, Luanna Cole, MD  levothyroxine (SYNTHROID) 100 MCG tablet Take 1 tablet by mouth once daily 06/19/22  Yes Baxley, Luanna Cole, MD  losartan (COZAAR) 100 MG tablet Take 1 tablet (100 mg total) by mouth at bedtime. 11/03/21  Yes Orbie Pyo, MD  metoprolol succinate (TOPROL XL) 25 MG 24 hr tablet Take 1 tablet (25 mg total) by mouth at bedtime. 11/03/21  Yes Orbie Pyo, MD  mupirocin ointment (BACTROBAN) 2 % Apply 1 Application topically 2 (two) times daily. 09/01/21  Yes BaxleyLuanna Cole, MD  omeprazole (PRILOSEC) 20 MG capsule Take 1 capsule by mouth once daily 02/15/22  Yes Baxley, Luanna Cole, MD  Spacer/Aero-Holding Chambers DEVI 1 each by Does not apply route in the morning and at bedtime. 10/10/21  Yes Maccia, Melissa D, RPH-CPP  spironolactone (ALDACTONE) 25 MG tablet Take 1 tablet (25 mg total) by mouth at bedtime. 11/03/21  Yes Orbie Pyo, MD    Family History Family History  Problem Relation Age of Onset   Heart failure Mother    Heart failure Father    Hypertension Sister    Neuropathy Neg Hx     Social History Social History   Tobacco Use   Smoking status:  Former    Packs/day: 1.00    Years: 10.00    Additional pack years: 0.00    Total pack years: 10.00    Types: Cigarettes    Quit date: 03/30/1991    Years since quitting: 31.2   Smokeless tobacco: Never   Tobacco comments:    quit smoking in Mar 1993  Substance Use Topics   Alcohol use: No    Alcohol/week: 0.0 standard drinks of alcohol  Drug use: No     Allergies   Doxycycline   Review of Systems Review of Systems Per HPI  Physical Exam Triage Vital Signs ED Triage Vitals  Enc Vitals Group     BP 07/07/22 1256 119/68     Pulse Rate 07/07/22 1256 62     Resp 07/07/22 1256 16     Temp 07/07/22 1256 97.7 F (36.5 C)     Temp Source 07/07/22 1256 Oral     SpO2 07/07/22 1256 94 %     Weight 07/07/22 1255 123 lb 14.4 oz (56.2 kg)     Height 07/07/22 1255 4\' 11"  (1.499 m)     Head Circumference --      Peak Flow --      Pain Score 07/07/22 1253 5     Pain Loc --      Pain Edu? --      Excl. in GC? --    No data found.  Updated Vital Signs BP 119/68 (BP Location: Left Arm)   Pulse 62   Temp 97.7 F (36.5 C) (Oral)   Resp 16   Ht 4\' 11"  (1.499 m)   Wt 123 lb 14.4 oz (56.2 kg)   SpO2 94%   BMI 25.02 kg/m    Physical Exam Vitals and nursing note reviewed.  Constitutional:      General: She is not in acute distress. HENT:     Mouth/Throat:     Pharynx: Oropharynx is clear.  Cardiovascular:     Rate and Rhythm: Normal rate and regular rhythm.     Pulses: Normal pulses.     Heart sounds: Normal heart sounds.  Pulmonary:     Effort: Pulmonary effort is normal.     Breath sounds: Normal breath sounds.  Musculoskeletal:     Cervical back: Normal range of motion.     Comments: Full ROM of right upper extremity. Distal sensation intact. Cap refill < 2 seconds, strong radial pulse  Skin:    Capillary Refill: Capillary refill takes less than 2 seconds.     Findings: Wound present.     Comments: Skin tear on right upper arm, just superior to antecubital  area. Separates with light pressure and extension of arm. Bleeding is controlled. Bruise forming around wound. No other lacerations or abrasions noted.   Neurological:     Mental Status: She is alert and oriented to person, place, and time.     UC Treatments / Results  Labs (all labs ordered are listed, but only abnormal results are displayed) Labs Reviewed - No data to display  EKG  Radiology No results found.  Procedures Procedures   Medications Ordered in UC Medications - No data to display  Initial Impression / Assessment and Plan / UC Course  I have reviewed the triage vital signs and the nursing notes.  Pertinent labs & imaging results that were available during my care of the patient were reviewed by me and considered in my medical decision making (see chart for details).  Cleansed wound with Shur-cleanse and water. Applied 3 steri-strips, wound well approximated. Non stick dressing and coban wrap. Discussed wound care, pain control, signs of infection to monitor for. All questions answered, concerns addressed. Understands return precautions.   Final Clinical Impressions(s) / UC Diagnoses   Final diagnoses:  Skin tear of right upper arm without complication, initial encounter     Discharge Instructions      Monitor for any signs of infection such as;  increasing pain, spreading redness, drainage from the wound, increased swelling. Return if you notice these symptoms.  Do NOT soak the wound in any water. Keep the area clean and dry. Do not apply any ointments over the stickers. The stickers will fall off on their own in a few days. Do not remove them yourself. Change the gauze at least once daily. Take tylenol for pain/discomfort.      ED Prescriptions   None    PDMP not reviewed this encounter.   Marlow Baars, [place] 07/07/22 1553

## 2022-07-07 NOTE — ED Triage Notes (Signed)
Skin ripped about 20 minutes before the Patient arrived her. Patient states she was getting out of the car, the tip of the car door caught her arm and tore the skin on the right lateral forearm and elbow.   Patient not on any blood thinners.

## 2022-08-17 ENCOUNTER — Other Ambulatory Visit: Payer: Self-pay | Admitting: Internal Medicine

## 2022-09-01 ENCOUNTER — Other Ambulatory Visit: Payer: Self-pay | Admitting: Internal Medicine

## 2022-09-13 ENCOUNTER — Other Ambulatory Visit: Payer: Self-pay | Admitting: Internal Medicine

## 2022-09-18 ENCOUNTER — Other Ambulatory Visit: Payer: Self-pay | Admitting: Internal Medicine

## 2022-09-22 ENCOUNTER — Other Ambulatory Visit: Payer: Self-pay | Admitting: Internal Medicine

## 2022-09-26 NOTE — Progress Notes (Signed)
Patient Care Team: Margaree Mackintosh, MD as PCP - General (Internal Medicine) Orbie Pyo, MD as PCP - Cardiology (Cardiology)  Visit Date: 10/03/22  Subjective:    Patient ID: Terri Bridges , Female   DOB: 1935/01/02, 88 y.o.    MRN: [id number]   88 y.o. Female presents today for a 6 month follow-up. History of allergic rhinitis, allergy, anxiety and depression, arthritis, chronic lower back pain, constipation, GERD, hiatal hernia, heart murmur, hypertension, hypothyroidism, nocturia, osteoporosis, scoliosis.  Seen in ED on 07/07/22 for a skin tear of the right upper arm. Wound was cleaned, dressed, and covered.   History of mild impaired glucose tolerance. HGBA1c at 5.7%, no change from 6 months ago.  History of anxiety treated with Xanax 0.5 mg daily at bedtime.   History of osteoporosis treated with Boniva 150 mg monthly. DEXA scan last completed 03/07/17. T-score at -3.0 at total left femur.   History of hypertension treated with amlodipine 5 mg daily, losartan 100 mg at bedtime, metoprolol succinate 25 mg at bedtime.  History of heart failure treated with Lasix 20 mg daily, Aldactone 25 mg daily at bedtime. Followed by [facility].   History of Stage 3b chronic kidney failure treated with Jardiance 10 mg daily. BUN elevated at 40 on 09/28/22, down from 62 on 03/21/22. Creatinine elevated at 1.91 on 09/28/22, up from 1.43 on 09/09/21. This is probably related to her heart failure, NSAID use, longstanding history of hypertension. She did not drink any water on the morning of this blood draw. Denies frequent use of NSAID's.  History of asthma and allergic rhinitis treated by Dr. Lucie Leather.   History of COPD and restrictive lung disease. Followed by pulmonary.  History of hypothyroidism treated with levothyroxine 100 mcg daily. TSH at 0.43.   Because of GE reflux it was not likely she would tolerate Fosamax. She took Actonel for a while in 2002 but discontinued it. Treated with  Prilosec 20 mg daily.   Labs are reviewed. Glucose normal. Electrolytes normal. LDL slightly elevated at 104.   Mammogram last completed 03/07/17. No mammographic evidence of malignancy. Recommended repeat in 2020.   She is a former heavy smoker having smoked 1 to 2 packs of cigarettes daily for 17 years. She quit smoking in 1993.  Past Medical History:  Diagnosis Date   Allergic rhinitis    Allergy    uses Flonase daily as needed   Anxiety    takes Xanax daily as needed   Arthritis    "back" (04/21/2013)   Chronic lower back pain    Constipation    takes an OTC stool softener   Depression    takes Zoloft daily   Dry eyes    uses eye drops   Exertional shortness of breath    GERD (gastroesophageal reflux disease)    takes Omeprazole daily and Protonix daily as needed   H/O hiatal hernia    Headache(784.0)    sinus   Heart murmur    years ago   History of bronchitis    last time many yrs ago   Hypertension    takes Losartan and Cardizem daily   Hypothyroidism    takes Synthroid daily   Joint pain    Joint swelling    Nocturia    Osteoporosis    Pressure in chest 08/23/13   fell and started having chest pressure, will have ECHO and stress test 09/04/13 before surgery   Ringing in ears  sees Dr.Byers for this   Scoliosis    Urinary frequency    Urinary incontinence    Urinary urgency      Family History  Problem Relation Age of Onset   Heart failure Mother    Heart failure Father    Hypertension Sister    Neuropathy Neg Hx     Social History   Social History Narrative   Originally from [place].    Previously worked in Production designer, theatre/television/film for [employer].    Has 2 dogs currently.    Remote exposure to parrots in her current home.    No known mold exposure.    Remote travel to [facility] & [place].    Has 1 indoor plant.    Caffeine use: No soda   2 cups coffee every morning      Review of Systems  Constitutional:  Negative for fever and malaise/fatigue.  HENT:  Negative for  congestion.   Eyes:  Negative for blurred vision.  Respiratory:  Negative for cough and shortness of breath.   Cardiovascular:  Negative for chest pain, palpitations and leg swelling.  Gastrointestinal:  Negative for vomiting.  Musculoskeletal:  Negative for back pain.  Skin:  Negative for rash.  Neurological:  Negative for loss of consciousness and headaches.        Objective:   Vitals: BP 110/80   Pulse (!) 53   Ht 4\' 11"  (1.499 m)   Wt 120 lb (54.4 kg)   SpO2 98%   BMI 24.24 kg/m    Physical Exam Vitals and nursing note reviewed.  Constitutional:      General: She is not in acute distress.    Appearance: Normal appearance. She is not toxic-appearing.  HENT:     Head: Normocephalic and atraumatic.  Cardiovascular:     Rate and Rhythm: Normal rate and regular rhythm. No extrasystoles are present.    Pulses: Normal pulses.     Heart sounds: Normal heart sounds. No murmur heard.    No friction rub. No gallop.     Comments: Bradycardic. Pulmonary:     Effort: Pulmonary effort is normal. No respiratory distress.     Breath sounds: Normal breath sounds. No wheezing or rales.  Skin:    General: Skin is warm and dry.  Neurological:     Mental Status: She is alert and oriented to person, place, and time. Mental status is at baseline.  Psychiatric:        Mood and Affect: Mood normal.        Behavior: Behavior normal.        Thought Content: Thought content normal.        Judgment: Judgment normal.       Results:   Studies obtained and personally reviewed by me:  Mammogram last completed 03/07/17. No mammographic evidence of malignancy. Recommended repeat in 2020.   Labs:       Component Value Date/Time   NA 140 09/28/2022 0932   NA 143 11/03/2021 1207   K 5.3 09/28/2022 0932   CL 106 09/28/2022 0932   CO2 25 09/28/2022 0932   GLUCOSE 86 09/28/2022 0932   BUN 40 (H) 09/28/2022 0932   BUN 24 11/03/2021 1207   CREATININE 1.91 (H) 09/28/2022 0932   CALCIUM  9.8 09/28/2022 0932   PROT 6.6 03/21/2022 0910   ALBUMIN 3.7 01/20/2016 1027   AST 21 03/21/2022 0910   ALT 14 03/21/2022 0910   ALKPHOS 67 01/20/2016 1027   BILITOT 0.5  03/21/2022 0910   GFRNONAA 34 (L) 03/18/2020 1209   GFRAA 40 (L) 03/18/2020 1209     Lab Results  Component Value Date   WBC 5.6 03/21/2022   HGB 13.6 03/21/2022   HCT 42.3 03/21/2022   MCV 87.6 03/21/2022   PLT 177 03/21/2022    Lab Results  Component Value Date   CHOL 196 09/28/2022   HDL 77 09/28/2022   LDLCALC 104 (H) 09/28/2022   TRIG 64 09/28/2022   CHOLHDL 2.5 09/28/2022    Lab Results  Component Value Date   HGBA1C 5.7 (H) 09/28/2022     Lab Results  Component Value Date   TSH 0.43 09/28/2022      Assessment & Plan:   Mild impaired glucose tolerance: HGBA1c at 5.7%, no change from 6 months ago.  Anxiety: stable with Xanax 0.5 mg daily at bedtime.   Osteoporosis: treated with Boniva 150 mg monthly. DEXA scan last completed 03/07/17. T-score at -3.0 at total left femur.   Hypertension: treated with amlodipine 5 mg daily, losartan 100 mg at bedtime, metoprolol succinate 25 mg at bedtime. Blood pressure normal today at 110/80.   Dilated Cardiomyopathy: treated with Lasix 20 mg daily, Aldactone 25 mg daily at bedtime. Followed by [facility].   Stage 4- chronic kidney disease: treated with Jardiance 10 mg daily. BUN elevated at 40 on 09/28/22, down from 62 on 03/21/22. Creatinine elevated at 1.91 on 09/28/22, up from 1.43 on 09/09/21. She did not drink any water on the morning of this blood draw. Ordered renal ultrasound.  Asthma; allergic rhinitis: treated by Dr. Lucie Leather.  COPD and restrictive lung disease: followed by Pulmonary.  Hypothyroidism: treated with levothyroxine 100 mcg daily. TSH at 0.43.   Mammogram last completed 03/07/17. No mammographic evidence of malignancy. Recommended repeat in 2020.   Vaccine counseling: UTD on tetanus, pneumococcal 20 vaccines. Suggested flu, Covid-19  boosters with time in-between.  Return next week for repeat kidney functions.    I,Alexander Ruley,acting as a Neurosurgeon for Margaree Mackintosh, MD.,have documented all relevant documentation on the behalf of Margaree Mackintosh, MD,as directed by  Margaree Mackintosh, MD while in the presence of Margaree Mackintosh, MD.   I, Margaree Mackintosh, MD, have reviewed all documentation for this visit. The documentation on 10/03/22 for the exam, diagnosis, procedures, and orders are all accurate and complete.

## 2022-09-28 ENCOUNTER — Other Ambulatory Visit: Payer: Medicare Other

## 2022-09-28 DIAGNOSIS — E039 Hypothyroidism, unspecified: Secondary | ICD-10-CM | POA: Diagnosis not present

## 2022-09-28 DIAGNOSIS — R7302 Impaired glucose tolerance (oral): Secondary | ICD-10-CM

## 2022-09-28 DIAGNOSIS — F419 Anxiety disorder, unspecified: Secondary | ICD-10-CM

## 2022-09-28 DIAGNOSIS — N184 Chronic kidney disease, stage 4 (severe): Secondary | ICD-10-CM | POA: Diagnosis not present

## 2022-09-28 DIAGNOSIS — I10 Essential (primary) hypertension: Secondary | ICD-10-CM

## 2022-09-28 DIAGNOSIS — M1711 Unilateral primary osteoarthritis, right knee: Secondary | ICD-10-CM

## 2022-09-28 DIAGNOSIS — Z0000 Encounter for general adult medical examination without abnormal findings: Secondary | ICD-10-CM

## 2022-09-28 NOTE — Addendum Note (Signed)
Addended by: Gregery Na on: 09/28/2022 09:27 AM   Modules accepted: Orders

## 2022-09-29 LAB — BASIC METABOLIC PANEL
BUN/Creatinine Ratio: 21 (calc) (ref 6–22)
BUN: 40 mg/dL — ABNORMAL HIGH (ref 7–25)
CO2: 25 mmol/L (ref 20–32)
Calcium: 9.8 mg/dL (ref 8.6–10.4)
Chloride: 106 mmol/L (ref 98–110)
Creat: 1.91 mg/dL — ABNORMAL HIGH (ref 0.60–0.95)
Glucose, Bld: 86 mg/dL (ref 65–99)
Potassium: 5.3 mmol/L (ref 3.5–5.3)
Sodium: 140 mmol/L (ref 135–146)

## 2022-09-29 LAB — HEMOGLOBIN A1C
Hgb A1c MFr Bld: 5.7 % of total Hgb — ABNORMAL HIGH (ref <5.7)
Mean Plasma Glucose: 117 mg/dL
eAG (mmol/L): 6.5 mmol/L

## 2022-09-29 LAB — LIPID PANEL
Cholesterol: 196 mg/dL (ref <200)
HDL: 77 mg/dL (ref >=50)
LDL Cholesterol (Calc): 104 mg/dL (calc) — ABNORMAL HIGH
Non-HDL Cholesterol (Calc): 119 mg/dL (calc) (ref <130)
Total CHOL/HDL Ratio: 2.5 (calc) (ref <5.0)
Triglycerides: 64 mg/dL (ref <150)

## 2022-09-29 LAB — TSH: TSH: 0.43 mIU/L (ref 0.40–4.50)

## 2022-10-03 ENCOUNTER — Ambulatory Visit (INDEPENDENT_AMBULATORY_CARE_PROVIDER_SITE_OTHER): Payer: Medicare Other | Admitting: Internal Medicine

## 2022-10-03 ENCOUNTER — Encounter: Payer: Self-pay | Admitting: Internal Medicine

## 2022-10-03 VITALS — BP 110/80 | HR 53 | Ht 59.0 in | Wt 120.0 lb

## 2022-10-03 DIAGNOSIS — R7302 Impaired glucose tolerance (oral): Secondary | ICD-10-CM | POA: Diagnosis not present

## 2022-10-03 DIAGNOSIS — M810 Age-related osteoporosis without current pathological fracture: Secondary | ICD-10-CM | POA: Diagnosis not present

## 2022-10-03 DIAGNOSIS — R7989 Other specified abnormal findings of blood chemistry: Secondary | ICD-10-CM | POA: Diagnosis not present

## 2022-10-03 DIAGNOSIS — I10 Essential (primary) hypertension: Secondary | ICD-10-CM | POA: Diagnosis not present

## 2022-10-03 DIAGNOSIS — N184 Chronic kidney disease, stage 4 (severe): Secondary | ICD-10-CM | POA: Diagnosis not present

## 2022-10-03 DIAGNOSIS — I340 Nonrheumatic mitral (valve) insufficiency: Secondary | ICD-10-CM

## 2022-10-03 DIAGNOSIS — Z8709 Personal history of other diseases of the respiratory system: Secondary | ICD-10-CM

## 2022-10-03 NOTE — Patient Instructions (Addendum)
Ultrasound of kidneys ordered. Return for labs (B-met) when well hydrated. Continue same meds.Referral to [facility].

## 2022-10-12 ENCOUNTER — Other Ambulatory Visit: Payer: Medicare Other

## 2022-10-12 DIAGNOSIS — R7989 Other specified abnormal findings of blood chemistry: Secondary | ICD-10-CM | POA: Diagnosis not present

## 2022-10-12 DIAGNOSIS — R799 Abnormal finding of blood chemistry, unspecified: Secondary | ICD-10-CM

## 2022-10-13 ENCOUNTER — Ambulatory Visit
Admission: RE | Admit: 2022-10-13 | Discharge: 2022-10-13 | Disposition: A | Discharge status: Home / Self Care | Payer: Medicare Other | Source: Ambulatory Visit | Attending: Internal Medicine | Admitting: Internal Medicine

## 2022-10-13 DIAGNOSIS — N281 Cyst of kidney, acquired: Secondary | ICD-10-CM | POA: Diagnosis not present

## 2022-10-13 LAB — BUN/CREATININE RATIO
BUN/Creatinine Ratio: 13 (calc) (ref 6–22)
BUN: 24 mg/dL (ref 7–25)
Creat: 1.81 mg/dL — ABNORMAL HIGH (ref 0.60–0.95)
eGFR: 27 mL/min/1.73m2 — ABNORMAL LOW

## 2022-10-13 NOTE — Progress Notes (Signed)
Referral already placed.

## 2022-10-16 ENCOUNTER — Telehealth: Payer: Self-pay | Admitting: Pharmacist

## 2022-10-16 NOTE — Telephone Encounter (Signed)
Patient called in stating she has 10 tablets of jardiance left and she doesn't know what to do and what strength she should be on. She says normally she gets the medication mailed to her from [facility]. Looks like Dr. Lynnette Caffey increased dose to 25mg , but she doesn't have DM, so 10mg  is the correct dose.  I gave her the number to [facility] to call for refill. Advised to call me back with any problems.

## 2022-10-24 DIAGNOSIS — I129 Hypertensive chronic kidney disease with stage 1 through stage 4 chronic kidney disease, or unspecified chronic kidney disease: Secondary | ICD-10-CM | POA: Diagnosis not present

## 2022-10-24 DIAGNOSIS — I420 Dilated cardiomyopathy: Secondary | ICD-10-CM | POA: Diagnosis not present

## 2022-10-24 DIAGNOSIS — N184 Chronic kidney disease, stage 4 (severe): Secondary | ICD-10-CM | POA: Diagnosis not present

## 2022-10-24 DIAGNOSIS — N1832 Chronic kidney disease, stage 3b: Secondary | ICD-10-CM | POA: Diagnosis not present

## 2022-10-24 DIAGNOSIS — N281 Cyst of kidney, acquired: Secondary | ICD-10-CM | POA: Diagnosis not present

## 2022-10-29 ENCOUNTER — Other Ambulatory Visit: Payer: Self-pay | Admitting: Internal Medicine

## 2022-11-01 ENCOUNTER — Ambulatory Visit (HOSPITAL_COMMUNITY)
Admission: EM | Admit: 2022-11-01 | Discharge: 2022-11-01 | Disposition: A | Discharge status: Home / Self Care | Payer: Medicare Other | Attending: Emergency Medicine | Admitting: Emergency Medicine

## 2022-11-01 ENCOUNTER — Ambulatory Visit (INDEPENDENT_AMBULATORY_CARE_PROVIDER_SITE_OTHER): Payer: Medicare Other

## 2022-11-01 ENCOUNTER — Encounter (HOSPITAL_COMMUNITY): Payer: Self-pay

## 2022-11-01 DIAGNOSIS — R058 Other specified cough: Secondary | ICD-10-CM | POA: Diagnosis not present

## 2022-11-01 DIAGNOSIS — J069 Acute upper respiratory infection, unspecified: Secondary | ICD-10-CM | POA: Diagnosis not present

## 2022-11-01 DIAGNOSIS — I700 Atherosclerosis of aorta: Secondary | ICD-10-CM | POA: Diagnosis not present

## 2022-11-01 DIAGNOSIS — K449 Diaphragmatic hernia without obstruction or gangrene: Secondary | ICD-10-CM | POA: Diagnosis not present

## 2022-11-01 DIAGNOSIS — I771 Stricture of artery: Secondary | ICD-10-CM | POA: Diagnosis not present

## 2022-11-01 MED ORDER — BENZONATATE 100 MG PO CAPS: 100.0000 mg | ORAL_CAPSULE | Freq: Three times a day (TID) | ORAL | 0 refills | Status: DC

## 2022-11-01 NOTE — ED Provider Notes (Signed)
[facility]    CSN: [id number] Arrival date & time: 11/01/22  1214      History   Chief Complaint Chief Complaint  Patient presents with   Cough    HPI Terri Bridges is a 88 y.o. female.   Patient presents to clinic complaining of a productive cough with yellowish-green sputum that started on Friday.  Overall she has felt slightly more fatigued.  She did test for COVID-19 at home yesterday, or the day before (she is unsure) and it was negative.  Denies shortness of breath or wheezing. No fevers.   Patient has been using over-the-counter cough medication and cough drops, reports this is flaring up her acid reflux and not really improving her cough.  Does have a history of HFrEF, denies any lower extremity edema.  The history is provided by the patient and medical records.  Cough Associated symptoms: no chest pain, no fever, no shortness of breath and no wheezing     Past Medical History:  Diagnosis Date   Allergic rhinitis    Allergy    uses Flonase daily as needed   Anxiety    takes Xanax daily as needed   Arthritis    "back" (04/21/2013)   Chronic lower back pain    Constipation    takes an OTC stool softener   Depression    takes Zoloft daily   Dry eyes    uses eye drops   Exertional shortness of breath    GERD (gastroesophageal reflux disease)    takes Omeprazole daily and Protonix daily as needed   H/O hiatal hernia    Headache(784.0)    sinus   Heart murmur    years ago   History of bronchitis    last time many yrs ago   Hypertension    takes Losartan and Cardizem daily   Hypothyroidism    takes Synthroid daily   Joint pain    Joint swelling    Nocturia    Osteoporosis    Pressure in chest 08/23/13   fell and started having chest pressure, will have ECHO and stress test 09/04/13 before surgery   Ringing in ears    sees Dr.Byers for this   Scoliosis    Urinary frequency    Urinary incontinence    Urinary urgency      Patient Active Problem List   Diagnosis Date Noted   HFrEF (heart failure with reduced ejection fraction) (HCC) 10/10/2021   Asthma 03/26/2018   Left inguinal hernia 08/05/2017   Osteoarthritis of right knee 08/05/2017   Chronic pansinusitis 05/31/2016   Eustachian tube dysfunction, bilateral 05/31/2016   Sensorineural hearing loss (SNHL), bilateral 05/31/2016   Carpal tunnel syndrome 05/12/2015   Degenerative arthritis of finger 05/12/2015   Primary arthrosis of first carpometacarpal joints, bilateral 05/12/2015   Chronic kidney disease 05/22/2014   Left knee pain 09/08/2013   H/O total knee replacement 08/11/2013   S/P total knee arthroplasty 04/21/2013   Gonalgia 04/11/2013   Osteoporosis 03/24/2012   At high risk for falls 02/12/2012   Leg pain, bilateral 02/12/2012   Anxiety 03/06/2011   COPD (chronic obstructive pulmonary disease) (HCC) 01/08/2011   Hypertension 01/05/2011   Hypothyroidism 01/05/2011   Recurrent sinusitis 01/05/2011   Allergic rhinitis 01/05/2011   Depression 01/05/2011   GE reflux 01/05/2011    Past Surgical History:  Procedure Laterality Date   CARDIAC CATHETERIZATION  2005   CATARACT EXTRACTION W/ INTRAOCULAR LENS  IMPLANT, BILATERAL Bilateral  COLONOSCOPY     ESOPHAGOGASTRODUODENOSCOPY     EXCISIONAL TOTAL KNEE ARTHROPLASTY Left 09/08/2013   Procedure: EXCISIONAL TOTAL KNEE ARTHROPLASTY POLYEXCHANGE;  Surgeon: Dannielle Huh, MD;  Location: [facility];  Service: Orthopedics;  Laterality: Left;   EYE SURGERY     JOINT REPLACEMENT     KNEE ARTHROSCOPY Right    NASAL SINUS SURGERY     x 4   SHOULDER ARTHROSCOPY W/ ROTATOR CUFF REPAIR Right    TONSILLECTOMY  1940's   TOTAL KNEE ARTHROPLASTY Left 04/21/2013   TOTAL KNEE ARTHROPLASTY Left 04/21/2013   Procedure: TOTAL KNEE ARTHROPLASTY;  Surgeon: Dannielle Huh, MD;  Location: [facility];  Service: Orthopedics;  Laterality: Left;   TUBAL LIGATION      OB History   No obstetric history on file.       Home Medications    Prior to Admission medications   Medication Sig Start Date End Date Taking? Authorizing Provider  benzonatate (TESSALON) 100 MG capsule Take 1 capsule (100 mg total) by mouth every 8 (eight) hours. 11/01/22  Yes Rinaldo Ratel, [place], FNP  ALPRAZolam Prudy Feeler) 0.5 MG tablet TAKE 1 TABLET BY MOUTH AT BEDTIME 10/29/22   Margaree Mackintosh, MD  aspirin EC 81 MG tablet Take 1 tablet (81 mg total) by mouth daily. Swallow whole. 07/22/21   Orbie Pyo, MD  Budeson-Glycopyrrol-Formoterol (BREZTRI AEROSPHERE) 160-9-4.8 MCG/ACT AERO Inhale 2 puffs into the lungs 2 (two) times daily.    [provider]  empagliflozin (JARDIANCE) 10 MG TABS tablet Take 1 tablet (10 mg total) by mouth daily before breakfast. 06/06/22   Orbie Pyo, MD  furosemide (LASIX) 20 MG tablet Take 1 tablet by mouth once daily 09/01/22   Margaree Mackintosh, MD  ibandronate (BONIVA) 150 MG tablet Take 1 tablet (150 mg total) by mouth every 30 (thirty) days. Take in the morning with a full glass of water, on an empty stomach, and do not take anything else by mouth or lie down for the next 30 min. 04/11/22   Margaree Mackintosh, MD  levothyroxine (SYNTHROID) 100 MCG tablet Take 1 tablet by mouth once daily 09/18/22   Margaree Mackintosh, MD  losartan (COZAAR) 100 MG tablet Take 1 tablet (100 mg total) by mouth at bedtime. 11/03/21   Orbie Pyo, MD  metoprolol succinate (TOPROL XL) 25 MG 24 hr tablet Take 1 tablet (25 mg total) by mouth at bedtime. 11/03/21   Orbie Pyo, MD  mupirocin ointment (BACTROBAN) 2 % Apply 1 Application topically 2 (two) times daily. 09/01/21   Margaree Mackintosh, MD  omeprazole (PRILOSEC) 20 MG capsule Take 1 capsule by mouth once daily 02/15/22   Margaree Mackintosh, MD  Spacer/Aero-Holding Chambers DEVI 1 each by Does not apply route in the morning and at bedtime. 10/10/21   Malena Peer D, RPH-CPP  spironolactone (ALDACTONE) 25 MG tablet Take 1 tablet (25 mg total) by mouth at bedtime.  11/03/21   Orbie Pyo, MD    Family History Family History  Problem Relation Age of Onset   Heart failure Mother    Heart failure Father    Hypertension Sister    Neuropathy Neg Hx     Social History Social History   Tobacco Use   Smoking status: Former    Current packs/day: 0.00    Average packs/day: 1 pack/day for 10.0 years (10.0 ttl pk-yrs)    Types: Cigarettes    Start date: 03/29/1981    Quit date:  03/30/1991    Years since quitting: 31.6   Smokeless tobacco: Never   Tobacco comments:    quit smoking in Mar 1993  Vaping Use   Vaping status: Never Used  Substance Use Topics   Alcohol use: No    Alcohol/week: 0.0 standard drinks of alcohol   Drug use: No     Allergies   Doxycycline   Review of Systems Review of Systems  Constitutional:  Positive for fatigue. Negative for fever.  HENT:  Positive for congestion and postnasal drip.   Respiratory:  Positive for cough. Negative for shortness of breath and wheezing.   Cardiovascular:  Negative for chest pain.     Physical Exam Triage Vital Signs ED Triage Vitals  Encounter Vitals Group     BP 11/01/22 1254 134/71     Systolic BP Percentile --      Diastolic BP Percentile --      Pulse Rate 11/01/22 1254 60     Resp 11/01/22 1254 18     Temp 11/01/22 1254 98.8 F (37.1 C)     Temp Source 11/01/22 1254 Oral     SpO2 11/01/22 1254 95 %     Weight --      Height --      Head Circumference --      Peak Flow --      Pain Score 11/01/22 1253 0     Pain Loc --      Pain Education --      Exclude from Growth Chart --    No data found.  Updated Vital Signs BP 134/71 (BP Location: Right Arm)   Pulse 60   Temp 98.8 F (37.1 C) (Oral)   Resp 18   SpO2 95%   Visual Acuity Right Eye Distance:   Left Eye Distance:   Bilateral Distance:    Right Eye Near:   Left Eye Near:    Bilateral Near:     Physical Exam Vitals and nursing note reviewed.  Constitutional:      Appearance: Normal  appearance.  HENT:     Head: Normocephalic and atraumatic.     Right Ear: External ear normal.     Left Ear: External ear normal.     Nose: Nose normal.     Mouth/Throat:     Mouth: Mucous membranes are moist.  Eyes:     Conjunctiva/sclera: Conjunctivae normal.  Cardiovascular:     Rate and Rhythm: Normal rate and regular rhythm.     Heart sounds: Normal heart sounds. No murmur heard. Pulmonary:     Effort: No respiratory distress.     Breath sounds: Normal breath sounds.  Musculoskeletal:        General: Normal range of motion.  Skin:    General: Skin is warm and dry.  Neurological:     General: No focal deficit present.     Mental Status: She is alert and oriented to person, place, and time.  Psychiatric:        Mood and Affect: Mood normal.        Behavior: Behavior normal. Behavior is cooperative.      UC Treatments / Results  Labs (all labs ordered are listed, but only abnormal results are displayed) Labs Reviewed - No data to display  EKG   Radiology No results found.  Procedures Procedures (including critical care time)  Medications Ordered in UC Medications - No data to display  Initial Impression / Assessment and Plan / UC  Course  I have reviewed the triage vital signs and the nursing notes.  Pertinent labs & imaging results that were available during my care of the patient were reviewed by me and considered in my medical decision making (see chart for details).  Vitals and triage reviewed, patient is hemodynamically stable.  Lungs are vesicular, heart with regular rate and rhythm.  Due to age and symptoms, chest x-ray obtained.  Imaging does not show any obvious abnormalities or infiltrates, awaiting official radiology over-read. Suspect viral etiology, management for viral illness discussed. Strict return and emergency precautions given, no questions at this time.      Final Clinical Impressions(s) / UC Diagnoses   Final diagnoses:  Viral URI with  cough     Discharge Instructions      Overall your chest x-ray was reassuring.  I will contact you if the radiologist interprets it as pneumonia, and this requires treatment.  Use the [employer] as needed.  I suggest sleeping with a humidifier.  Continue with your over-the-counter treatment.  Please return to clinic if no improvement over the next 5 to 7 days, or you develop any new concerning symptoms.     ED Prescriptions     Medication Sig Dispense Auth. Provider   benzonatate (TESSALON) 100 MG capsule Take 1 capsule (100 mg total) by mouth every 8 (eight) hours. 21 capsule Sameeha Rockefeller, [place], [place]      PDMP not reviewed this encounter.   Ellene Bloodsaw, [place] N, [place] 11/01/22 [phone]

## 2022-11-01 NOTE — ED Triage Notes (Signed)
Pt c/o productive cough (yellowish-green sputum) since Friday. Had negative home Covid test. Endorses sore throat and nasal drainage.

## 2022-11-01 NOTE — Discharge Instructions (Signed)
Overall your chest x-ray was reassuring.  I will contact you if the radiologist interprets it as pneumonia, and this requires treatment.  Use the [employer] as needed.  I suggest sleeping with a humidifier.  Continue with your over-the-counter treatment.  Please return to clinic if no improvement over the next 5 to 7 days, or you develop any new concerning symptoms.

## 2022-11-06 ENCOUNTER — Encounter (HOSPITAL_COMMUNITY): Payer: Self-pay

## 2022-11-06 ENCOUNTER — Telehealth: Payer: Self-pay

## 2022-11-06 ENCOUNTER — Ambulatory Visit (HOSPITAL_COMMUNITY)
Admission: EM | Admit: 2022-11-06 | Discharge: 2022-11-06 | Disposition: A | Discharge status: Home / Self Care | Payer: Medicare Other | Attending: Internal Medicine | Admitting: Internal Medicine

## 2022-11-06 DIAGNOSIS — R051 Acute cough: Secondary | ICD-10-CM

## 2022-11-06 MED ORDER — AZITHROMYCIN 250 MG PO TABS: 250.0000 mg | ORAL_TABLET | Freq: Every day | ORAL | 0 refills | Status: DC

## 2022-11-06 NOTE — Discharge Instructions (Signed)
Continue your over-the-counter cough medicine, antibiotics as written.  See your doctor 5 days if no improvement

## 2022-11-06 NOTE — ED Triage Notes (Signed)
Pt presents to the office for cough and congestion x 1 weeks. Pt was prescribed tessalon pearls on 11/01/2022

## 2022-11-06 NOTE — Telephone Encounter (Signed)
Patient is returning phone call. Patient phone number is [phone].

## 2022-11-06 NOTE — Telephone Encounter (Signed)
Attempted to call pt, no answer, pt is currently at urgent care. This call was regards to patient assistance lvvm, for pt to call office back.

## 2022-11-06 NOTE — ED Provider Notes (Signed)
[facility]    CSN: [id number] Arrival date & time: 11/06/22  0930      History   Chief Complaint Chief Complaint  Patient presents with   Cough    HPI Donyetta Darius [facility] is a 88 y.o. female.    Cough Sick for 10 days symptoms include rhinorrhea, postnasal drip, scratchy throat, cough productive of green sputum. She was seen recently, chart review shows seen 11/01/2022, chest x-ray which did not show pneumonia was treated with Tessalon Perles.  States her symptoms persist.  Denies fever although did have chills.  Nuys chest pain.  Admits shortness of breath which is an ongoing issue, has an appointment with her cardiologist in 2 days.  Denies wheezing, abdominal pain, nausea, vomiting  Past Medical History:  Diagnosis Date   Allergic rhinitis    Allergy    uses Flonase daily as needed   Anxiety    takes Xanax daily as needed   Arthritis    "back" (04/21/2013)   Chronic lower back pain    Constipation    takes an OTC stool softener   Depression    takes Zoloft daily   Dry eyes    uses eye drops   Exertional shortness of breath    GERD (gastroesophageal reflux disease)    takes Omeprazole daily and Protonix daily as needed   H/O hiatal hernia    Headache(784.0)    sinus   Heart murmur    years ago   History of bronchitis    last time many yrs ago   Hypertension    takes Losartan and Cardizem daily   Hypothyroidism    takes Synthroid daily   Joint pain    Joint swelling    Nocturia    Osteoporosis    Pressure in chest 08/23/13   fell and started having chest pressure, will have ECHO and stress test 09/04/13 before surgery   Ringing in ears    sees Dr.Byers for this   Scoliosis    Urinary frequency    Urinary incontinence    Urinary urgency     Patient Active Problem List   Diagnosis Date Noted   HFrEF (heart failure with reduced ejection fraction) (HCC) 10/10/2021   Asthma 03/26/2018   Left inguinal hernia 08/05/2017   Osteoarthritis  of right knee 08/05/2017   Chronic pansinusitis 05/31/2016   Eustachian tube dysfunction, bilateral 05/31/2016   Sensorineural hearing loss (SNHL), bilateral 05/31/2016   Carpal tunnel syndrome 05/12/2015   Degenerative arthritis of finger 05/12/2015   Primary arthrosis of first carpometacarpal joints, bilateral 05/12/2015   Chronic kidney disease 05/22/2014   Left knee pain 09/08/2013   H/O total knee replacement 08/11/2013   S/P total knee arthroplasty 04/21/2013   Gonalgia 04/11/2013   Osteoporosis 03/24/2012   At high risk for falls 02/12/2012   Leg pain, bilateral 02/12/2012   Anxiety 03/06/2011   COPD (chronic obstructive pulmonary disease) (HCC) 01/08/2011   Hypertension 01/05/2011   Hypothyroidism 01/05/2011   Recurrent sinusitis 01/05/2011   Allergic rhinitis 01/05/2011   Depression 01/05/2011   GE reflux 01/05/2011    Past Surgical History:  Procedure Laterality Date   CARDIAC CATHETERIZATION  2005   CATARACT EXTRACTION W/ INTRAOCULAR LENS  IMPLANT, BILATERAL Bilateral    COLONOSCOPY     ESOPHAGOGASTRODUODENOSCOPY     EXCISIONAL TOTAL KNEE ARTHROPLASTY Left 09/08/2013   Procedure: EXCISIONAL TOTAL KNEE ARTHROPLASTY POLYEXCHANGE;  Surgeon: Dannielle Huh, MD;  Location: [facility];  Service: Orthopedics;  Laterality: Left;  EYE SURGERY     JOINT REPLACEMENT     KNEE ARTHROSCOPY Right    NASAL SINUS SURGERY     x 4   SHOULDER ARTHROSCOPY W/ ROTATOR CUFF REPAIR Right    TONSILLECTOMY  1940's   TOTAL KNEE ARTHROPLASTY Left 04/21/2013   TOTAL KNEE ARTHROPLASTY Left 04/21/2013   Procedure: TOTAL KNEE ARTHROPLASTY;  Surgeon: Dannielle Huh, MD;  Location: [facility];  Service: Orthopedics;  Laterality: Left;   TUBAL LIGATION      OB History   No obstetric history on file.      Home Medications    Prior to Admission medications   Medication Sig Start Date End Date Taking? Authorizing Provider  ALPRAZolam Prudy Feeler) 0.5 MG tablet TAKE 1 TABLET BY MOUTH AT BEDTIME 10/29/22  Yes  Baxley, Luanna Cole, MD  aspirin EC 81 MG tablet Take 1 tablet (81 mg total) by mouth daily. Swallow whole. 07/22/21  Yes Orbie Pyo, MD  benzonatate (TESSALON) 100 MG capsule Take 1 capsule (100 mg total) by mouth every 8 (eight) hours. 11/01/22  Yes Rinaldo Ratel, [place] N, FNP  Budeson-Glycopyrrol-Formoterol (BREZTRI AEROSPHERE) 160-9-4.8 MCG/ACT AERO Inhale 2 puffs into the lungs 2 (two) times daily.   Yes [provider]  empagliflozin (JARDIANCE) 10 MG TABS tablet Take 1 tablet (10 mg total) by mouth daily before breakfast. 06/06/22  Yes Orbie Pyo, MD  furosemide (LASIX) 20 MG tablet Take 1 tablet by mouth once daily 09/01/22  Yes Baxley, Luanna Cole, MD  ibandronate (BONIVA) 150 MG tablet Take 1 tablet (150 mg total) by mouth every 30 (thirty) days. Take in the morning with a full glass of water, on an empty stomach, and do not take anything else by mouth or lie down for the next 30 min. 04/11/22  Yes Baxley, Luanna Cole, MD  levothyroxine (SYNTHROID) 100 MCG tablet Take 1 tablet by mouth once daily 09/18/22  Yes Baxley, Luanna Cole, MD  losartan (COZAAR) 100 MG tablet Take 1 tablet (100 mg total) by mouth at bedtime. 11/03/21  Yes Orbie Pyo, MD  metoprolol succinate (TOPROL XL) 25 MG 24 hr tablet Take 1 tablet (25 mg total) by mouth at bedtime. 11/03/21  Yes Orbie Pyo, MD  mupirocin ointment (BACTROBAN) 2 % Apply 1 Application topically 2 (two) times daily. 09/01/21  Yes BaxleyLuanna Cole, MD  omeprazole (PRILOSEC) 20 MG capsule Take 1 capsule by mouth once daily 02/15/22  Yes Baxley, Luanna Cole, MD  Spacer/Aero-Holding Chambers DEVI 1 each by Does not apply route in the morning and at bedtime. 10/10/21  Yes Maccia, Melissa D, RPH-CPP  spironolactone (ALDACTONE) 25 MG tablet Take 1 tablet (25 mg total) by mouth at bedtime. 11/03/21  Yes Orbie Pyo, MD    Family History Family History  Problem Relation Age of Onset   Heart failure Mother    Heart failure Father    Hypertension Sister     Neuropathy Neg Hx     Social History Social History   Tobacco Use   Smoking status: Former    Current packs/day: 0.00    Average packs/day: 1 pack/day for 10.0 years (10.0 ttl pk-yrs)    Types: Cigarettes    Start date: 03/29/1981    Quit date: 03/30/1991    Years since quitting: 31.6   Smokeless tobacco: Never   Tobacco comments:    quit smoking in Mar 1993  Vaping Use   Vaping status: Never Used  Substance Use Topics   Alcohol  use: No    Alcohol/week: 0.0 standard drinks of alcohol   Drug use: No     Allergies   Doxycycline   Review of Systems Review of Systems  Respiratory:  Positive for cough.      Physical Exam Triage Vital Signs ED Triage Vitals [11/06/22 1037]  Encounter Vitals Group     BP 135/74     Systolic BP Percentile      Diastolic BP Percentile      Pulse Rate (!) 55     Resp 16     Temp 97.8 F (36.6 C)     Temp Source Oral     SpO2 95 %     Weight      Height      Head Circumference      Peak Flow      Pain Score      Pain Loc      Pain Education      Exclude from Growth Chart    No data found.  Updated Vital Signs BP 135/74 (BP Location: Left Arm)   Pulse (!) 55   Temp 97.8 F (36.6 C) (Oral)   Resp 16   SpO2 95%   Visual Acuity Right Eye Distance:   Left Eye Distance:   Bilateral Distance:    Right Eye Near:   Left Eye Near:    Bilateral Near:     Physical Exam   UC Treatments / Results  Labs (all labs ordered are listed, but only abnormal results are displayed) Labs Reviewed - No data to display  EKG   Radiology No results found.  Procedures Procedures (including critical care time)  Medications Ordered in UC Medications - No data to display  Initial Impression / Assessment and Plan / UC Course  I have reviewed the triage vital signs and the nursing notes.  Pertinent labs & imaging results that were available during my care of the patient were reviewed by me and considered in my medical decision  making (see chart for details).     88 year old female with productive cough for 10 days, no documented fever but had chills.  Seen here 5 days ago for same had chest x-ray without obvious infiltrate.  Lungs are clear, will treat with course of Zithromax to cover atypical infections however she was counseled to continue over-the-counter Robitussin DM and see her PCP if her symptoms fail to improve.  For severe symptoms.  Keep appointment with cardiologist in 2 days Final Clinical Impressions(s) / UC Diagnoses   Final diagnoses:  None   Discharge Instructions   None    ED Prescriptions   None    PDMP not reviewed this encounter.   Meliton Rattan, [place] 11/06/22 1121

## 2022-11-07 NOTE — Telephone Encounter (Signed)
Patient checking on the patient assistance for [facility]. Patient phone number is [phone].

## 2022-11-08 NOTE — Progress Notes (Unsigned)
Cardiology Office Note:  .   Date:  11/09/2022  ID:  Terri Bridges [place], DOB 12-30-1934, MRN [id number] PCP: Margaree Mackintosh, MD  [facility] Providers Cardiologist:  Orbie Pyo, MD {  History of Present Illness: Marland Kitchen   Terri Bridges is a 88 y.o. female with a past medical history of dilated cardiomyopathy, bifascicular block, stage III CKD, HLD, aortic atherosclerosis, nonrheumatic MVR, and hypertension here for follow-up appointment.  She was last seen February 2024 and at that time she had a TTE performed and her [place] Pepper was increased to 25 mg a day.  It was mentioned that the patient is against invasive procedures such as coronary angiography and ICD implantation and would like to be treated conservatively with medical therapy.  Today, she presents with a history of heart disease managed with Jardiance.   She reports a recent increase in Jardiance to 25mg , which she declined due to concerns about the cost and the effectiveness of her current regimen. She has been managing with 10mg  Jardiance, which she reports is working well for her. She denies fluid retention or dyspnea.  In addition to her heart disease, the patient recently had a cough and congestion, which she initially thought was a cold. However, due to the persistence of her symptoms, she sought medical attention and was prescribed azithromycin. She reports that her symptoms are improving and she has a few more doses of the antibiotic to complete.  Reports no shortness of breath nor dyspnea on exertion. Reports no chest pain, pressure, or tightness. No edema, orthopnea, PND. Reports no palpitations.   ROS: Pertinent ROS in HPI  Studies Reviewed: Marland Kitchen        Prior CV studies:   TTE 2023: Ejection fraction of 30 to 35% with mild to moderate mitral regurgitation   30-day monitor 2023: Predominantly sinus rhythm with no atrial fibrillation, sustained ventricular tachyarrhythmias, or bradycardia arrhythmias  with occasional nonsustained ventricular tachycardia, PVCs, and PACs.       Physical Exam:   VS:  BP 122/62   Pulse (!) 50   Ht 4\' 11"  (1.499 m)   Wt 119 lb 6.4 oz (54.2 kg)   SpO2 98%   BMI 24.12 kg/m    Wt Readings from Last 3 Encounters:  11/09/22 119 lb 6.4 oz (54.2 kg)  10/03/22 120 lb (54.4 kg)  07/07/22 123 lb 14.4 oz (56.2 kg)    GEN: Well nourished, well developed in no acute distress NECK: No JVD; No carotid bruits CARDIAC: RRR, no murmurs, rubs, gallops RESPIRATORY:  Clear to auscultation without rales, wheezing or rhonchi  ABDOMEN: Soft, non-tender, non-distended EXTREMITIES:  No edema; No deformity   ASSESSMENT AND PLAN: .     HLD -continue current medication regimen -LDL 104, Triglyerides 64  Nonrheumatic mitral valve regurgitation/DCM -continue to monitor via echo -symptomatic at this time  Bradycardia Heart rate in the 50s, down from 60s in February. No reported symptoms of dizziness or lightheadedness. Currently on Toprolol. -Reduce Toprolol to half a pill at night to potentially increase heart rate and reduce thirst.  Upper Respiratory Infection Recent cough and congestion, improving with Azithromycin. -Complete course of Azithromycin.  Diabetes Stable on Jardiance 10mg , recent confusion with medication shipment. -Continue Jardiance 10mg  as prescribed.  General Health Maintenance -Complete Jardiance paperwork for insurance coverage.       Dispo: She can follow-up in 6 months with Dr. Lynnette Caffey  Signed, Sharlene Dory, PA-C

## 2022-11-08 NOTE — Telephone Encounter (Signed)
Patient has pt assistance application completed [place] she will bring to appt on 11/26. Nothing further needed.

## 2022-11-09 ENCOUNTER — Ambulatory Visit: Payer: Medicare Other | Attending: Physician Assistant | Admitting: Physician Assistant

## 2022-11-09 ENCOUNTER — Telehealth: Payer: Self-pay

## 2022-11-09 ENCOUNTER — Encounter: Payer: Self-pay | Admitting: Physician Assistant

## 2022-11-09 VITALS — BP 122/62 | HR 50 | Ht 59.0 in | Wt 119.4 lb

## 2022-11-09 DIAGNOSIS — I5020 Unspecified systolic (congestive) heart failure: Secondary | ICD-10-CM

## 2022-11-09 DIAGNOSIS — I420 Dilated cardiomyopathy: Secondary | ICD-10-CM | POA: Diagnosis not present

## 2022-11-09 DIAGNOSIS — I452 Bifascicular block: Secondary | ICD-10-CM | POA: Diagnosis not present

## 2022-11-09 DIAGNOSIS — I340 Nonrheumatic mitral (valve) insufficiency: Secondary | ICD-10-CM

## 2022-11-09 DIAGNOSIS — I700 Atherosclerosis of aorta: Secondary | ICD-10-CM

## 2022-11-09 DIAGNOSIS — E785 Hyperlipidemia, unspecified: Secondary | ICD-10-CM | POA: Diagnosis not present

## 2022-11-09 DIAGNOSIS — N183 Chronic kidney disease, stage 3 unspecified: Secondary | ICD-10-CM

## 2022-11-09 MED ORDER — METOPROLOL SUCCINATE ER 25 MG PO TB24: 12.5000 mg | ORAL_TABLET | Freq: Every day | ORAL | 0 refills | Status: DC

## 2022-11-09 NOTE — Telephone Encounter (Signed)
Patient was seen today by Julian Hy but did not leave her annual income information.   Patient states she will bring the documents back to the office tomorrow morning. I will hold forms until she brings in the remaining documents.   Patient told to ask for me once she returns in the morning. She voiced understanding.

## 2022-11-09 NOTE — Patient Instructions (Addendum)
Medication Instructions:  DECREASE Metoprolol to 12.5mg  Take 1 tablet once a day every night  *If you need a refill on your cardiac medications before your next appointment, please call your pharmacy*   Lab Work: None Ordered   Testing/Procedures: None Ordered   Follow-Up: At [facility], you and your health needs are our priority.  As part of our continuing mission to provide you with exceptional heart care, we have created designated Provider Care Teams.  These Care Teams include your primary Cardiologist (physician) and Advanced Practice Providers (APPs -  Physician Assistants and Nurse Practitioners) who all work together to provide you with the care you need, when you need it.  We recommend signing up for the patient portal called "MyChart".  Sign up information is provided on this After Visit Summary.  MyChart is used to connect with patients for Virtual Visits (Telemedicine).  Patients are able to view lab/test results, encounter notes, upcoming appointments, etc.  Non-urgent messages can be sent to your provider as well.   To learn more about what you can do with MyChart, go to [email or web address].    Your next appointment:   6 month(s)  Provider:   Orbie Pyo, MD     Other Instructions

## 2022-11-10 NOTE — Telephone Encounter (Signed)
Patient has dropped off income forms and all patient assistance paperwork has been faxed over to main fax line and will be routed to patient assistance pool.

## 2022-11-13 ENCOUNTER — Telehealth: Payer: Self-pay

## 2022-11-13 ENCOUNTER — Other Ambulatory Visit (HOSPITAL_COMMUNITY): Payer: Self-pay

## 2022-11-13 MED ORDER — EMPAGLIFLOZIN 10 MG PO TABS: 10.0000 mg | ORAL_TABLET | Freq: Every day | ORAL | 3 refills | Status: DC

## 2022-11-13 NOTE — Telephone Encounter (Signed)
Please send in prescription for [facility] to [id number] - [place] - [address]  [address], [place]) [place] [address]  including grant billing information in RX comment ([id number], [id number], Group: [id number], XB:[id number])

## 2022-11-13 NOTE — Telephone Encounter (Signed)
Sent to pharmacy as requested.  Patient aware.

## 2022-11-13 NOTE — Telephone Encounter (Signed)
Patient Advocate Encounter   The patient was approved for a [employer] grant that will help cover the cost of JARDIANCE Total amount awarded, $10,000.  Effective: 10/14/22 - 10/13/23   [id number] [id number] [id number]  Haze Rushing, CPhT  Pharmacy Patient Advocate Specialist  Direct Number: [phone] Fax: [phone]

## 2022-11-17 ENCOUNTER — Encounter: Payer: Self-pay | Admitting: Internal Medicine

## 2022-11-17 ENCOUNTER — Telehealth: Payer: Self-pay | Admitting: Internal Medicine

## 2022-11-17 ENCOUNTER — Ambulatory Visit (INDEPENDENT_AMBULATORY_CARE_PROVIDER_SITE_OTHER): Payer: Medicare Other | Admitting: Internal Medicine

## 2022-11-17 VITALS — BP 110/70 | HR 82 | Temp 99.3°F | Ht 59.0 in | Wt 121.0 lb

## 2022-11-17 DIAGNOSIS — K5900 Constipation, unspecified: Secondary | ICD-10-CM | POA: Diagnosis not present

## 2022-11-17 NOTE — Telephone Encounter (Signed)
Notice patient has called back and got an appointment this afternoon.

## 2022-11-17 NOTE — Telephone Encounter (Signed)
LVM for patient to call back. ?

## 2022-11-17 NOTE — Telephone Encounter (Signed)
Copied from CRM [id number]. Topic: Clinical - Medical Advice >> Nov 17, 2022  2:49 PM Terri Bridges wrote: Reason for CRM: Pt called and needs advise on BM. Pt has tried everything over counter. Please would like a call [phone] back or communication via MyChart on what she should do?

## 2022-11-17 NOTE — Patient Instructions (Signed)
Please go to pharmacy and obtain 1 bottle of magnesium citrate and drink this tonight over ice and finish it before bedtime.  You will likely have a bowel movement early in the morning.  Stay well-hydrated.  Once the constipation is relieved hopefully overnight, start back taking MiraLAX daily.  Put 1 capful in 8 ounces of water and drink the entire glass daily.  Xanax may cause constipation.

## 2022-11-17 NOTE — Progress Notes (Signed)
   Subjective:    Patient ID: Terri Bridges, female    DOB: [birth year], 88 y.o.   MRN: [id number]  HPI Here today for constipation. Has tried multiple preparations this week including Dulcolax and  a capful of Miralax in water once or twice. Has only had small amount of stool thus far. She is worried and does not want  to have to go to ED over the weekend. She called the Call Center mid afternoon, and we agreed to see her immediately this afternoon.  Says she has no bright red blood per rectum, chills, fever, headache, abdominal pain.  Had Medicare wellness visit  here in March 2024.  She has a history hiatal hernia, GE reflux, heart murmur, COPD, hypothyroidism, anxiety, osteoporosis hyperlipidemia, chronic kidney disease stage IIIb, mild to moderate mitral valve regurgitation.  Has had cardiac telemetry showing no evidence of atrial fibrillation in July 2023.  No sustained V. tach or bradycardia arrhythmias.  At 11 beats of V. tach on cardiac telemetry in July 2023.  [place].  She continues to drive.  She is getting a bit forgetful.  Family is supportive.  Review of Systems no fever ,chills, nausea or vomiting. Says he appetite is fair. No dysuria     Objective:   Physical Exam  VS reviewed. She is anxious today. Chest is clear.  Cardiac exam regular rate and rhythm without significant ectopy.  Abdomen is soft nondistended without masses.  Rectal exam: She is not impacted.  Brown loose stool in rectal vault is guaiac negative.      Assessment & Plan:  Constipation: She does not appear to be obstructed.  Bowel sounds are active but not overactive.  It is too late in the afternoon for me to get an outpatient x-ray at [facility].  She does not want to go to the emergency department for evaluation.  She is to go to the pharmacy and obtain 1 bottle of magnesium citrate and drink the entire bottle over ice this evening.   This should help relieve her constipation and she can go back to taking MiraLAX on a daily basis.

## 2022-11-28 ENCOUNTER — Other Ambulatory Visit: Payer: Self-pay | Admitting: Internal Medicine

## 2022-12-04 DIAGNOSIS — N184 Chronic kidney disease, stage 4 (severe): Secondary | ICD-10-CM | POA: Diagnosis not present

## 2022-12-05 ENCOUNTER — Ambulatory Visit: Payer: Medicare Other | Admitting: Primary Care

## 2022-12-05 ENCOUNTER — Encounter: Payer: Self-pay | Admitting: Primary Care

## 2022-12-05 VITALS — BP 148/75 | HR 77 | Temp 98.0°F | Ht 63.0 in | Wt 120.8 lb

## 2022-12-05 DIAGNOSIS — J302 Other seasonal allergic rhinitis: Secondary | ICD-10-CM

## 2022-12-05 DIAGNOSIS — K219 Gastro-esophageal reflux disease without esophagitis: Secondary | ICD-10-CM

## 2022-12-05 DIAGNOSIS — J4540 Moderate persistent asthma, uncomplicated: Secondary | ICD-10-CM

## 2022-12-05 DIAGNOSIS — J449 Chronic obstructive pulmonary disease, unspecified: Secondary | ICD-10-CM | POA: Diagnosis not present

## 2022-12-05 DIAGNOSIS — R942 Abnormal results of pulmonary function studies: Secondary | ICD-10-CM

## 2022-12-05 MED ORDER — BREZTRI AEROSPHERE 160-9-4.8 MCG/ACT IN AERO: 2.0000 | INHALATION_SPRAY | Freq: Two times a day (BID) | RESPIRATORY_TRACT | Status: DC

## 2022-12-05 MED ORDER — BREZTRI AEROSPHERE 160-9-4.8 MCG/ACT IN AERO: 2.0000 | INHALATION_SPRAY | Freq: Two times a day (BID) | RESPIRATORY_TRACT | 11 refills | Status: DC

## 2022-12-05 MED ORDER — FLUTICASONE PROPIONATE 50 MCG/ACT NA SUSP: 1.0000 | Freq: Every day | NASAL | 0 refills | Status: DC

## 2022-12-05 MED ORDER — PROMETHAZINE-DM 6.25-15 MG/5ML PO SYRP: 5.0000 mL | ORAL_SOLUTION | Freq: Four times a day (QID) | ORAL | 0 refills | Status: DC | PRN

## 2022-12-05 NOTE — Progress Notes (Signed)
@Patient  ID: Terri Bridges, female    DOB: 11-09-1934, 88 y.o.   MRN: [id number]  No chief complaint on file.   Referring provider: Margaree Mackintosh, MD  HPI: 88 year old female, former smoker.  Past medical history significant for heart failure with reduced ejection fraction, hypertension, COPD, recurrent sinusitis, hypothyroidism, osteoporosis, kidney disease.  Patient of Dr. Tonia Brooms, last seen on 11/10/2021.  Previous [facility] pulmonary encounter:   OV 05/09/2021: Here today for follow-up.  Her shortness of breath is much improved.  She feels like she can walk her dog out to the mailbox without getting short of breath.  She has been using her Breztri inhaler.  She was unable to afford Trelegy.  [place].  Not really sure what the status of this application is or whether or not she has been approved.  We will need to follow-up with [place] today.  OV 11/10/2021: Follow-up regarding chronic symptoms.  She is doing really well on Breztri.  She has a longstanding prior history of smoking.  She is PFTs with a mixed obstructive restrictive defect.  Overall she is feeling better.  She still feels like she is a little weak at times.  But she attributes these changes to her getting older.  Mixed obstructive and restrictive ventilatory defect Moderate persistent asthma without complication Kyphoscoliosis Seasonal allergies Hiatal hernia   Discussion: This is an 88 year old female, likely moderate persistent asthma recurrent exacerbations as PFTs with a mixed obstructive restrictive defect.  Has been able to get medication sent to her house from [facility].  Currently using Breztri.   Plan: Continue triple therapy inhaler Continue PPI Continue albuterol as needed We will follow up with [place] as needed or in 1 year.    12/05/2022- Interim hx  Patient presents today for 1 year follow-up.  Followed by our office for history of moderate persistent asthma, seasonal  allergies and hiatal hernia.  Pulmonary function testing showed mixed obstructive and restrictive defect.  Currently maintained on [employer].  Plan is to continue triple therapy inhaler, albuterol as needed and PPI. CXR at [facility] in October 2024 showed no acute findings, large retrocardiac haital hernia, elevated right hemidiaphragm.  Discussed the use of AI scribe software for clinical note transcription with the patient, who gave verbal consent to proceed.  History of Present Illness   The patient, a known case of chronic cough, presented for a one-year follow-up. Despite being 88 years old, the patient reported feeling well overall, with her respiratory symptoms being relatively well controlled. She reports using Breztri inhaler, two puffs morning and evening, and a nebulizer for symptom management. The patient expressed a preference for the nebulizer, stating it works faster and is easier to use. However, she also acknowledged the convenience of the [facility] inhaler. The patient reported that the use of an aerochamber with the inhaler has improved her comfort during administration. The patient expressed a desire to continue her current regimen of Breztri inhaler and albuterol nebulizer as needed. She also reported a recent increase in hoarseness, which she attributed to the weather. The patient also reported taking omeprazole as needed for reflux and Jardiance for her diabetes.    Allergies  Allergen Reactions   Doxycycline Rash    Immunization History  Administered Date(s) Administered   Fluad Quad(high Dose 65+) 10/24/2018, 11/19/2021   Influenza Split 10/20/2011   Influenza, High Dose Seasonal PF 09/12/2013, 10/27/2015, 10/13/2016, 10/27/2017, 10/24/2018   Influenza,inj,Quad PF,6+ Mos 09/12/2013, 10/13/2016, 09/18/2019, 09/24/2020  Influenza-Unspecified 10/20/2011, 09/12/2013   Moderna Sars-Covid-2 Vaccination 02/22/2019, 03/22/2019, 11/24/2019   PNEUMOCOCCAL CONJUGATE-20 03/30/2022    Pfizer(Comirnaty)Fall Seasonal Vaccine 12 years and older 12/08/2021   Pneumococcal Conjugate-13 01/21/2015   Pneumococcal Polysaccharide-23 05/11/2003   Tdap 05/11/2003, 03/13/2013, 09/08/2019    Past Medical History:  Diagnosis Date   Allergic rhinitis    Allergy    uses Flonase daily as needed   Anxiety    takes Xanax daily as needed   Arthritis    "back" (04/21/2013)   Chronic lower back pain    Constipation    takes an OTC stool softener   Depression    takes Zoloft daily   Dry eyes    uses eye drops   Exertional shortness of breath    GERD (gastroesophageal reflux disease)    takes Omeprazole daily and Protonix daily as needed   H/O hiatal hernia    Headache(784.0)    sinus   Heart murmur    years ago   History of bronchitis    last time many yrs ago   Hypertension    takes Losartan and Cardizem daily   Hypothyroidism    takes Synthroid daily   Joint pain    Joint swelling    Nocturia    Osteoporosis    Pressure in chest 08/23/13   fell and started having chest pressure, will have ECHO and stress test 09/04/13 before surgery   Ringing in ears    sees Dr.Byers for this   Scoliosis    Urinary frequency    Urinary incontinence    Urinary urgency     Tobacco History: Social History   Tobacco Use  Smoking Status Former   Current packs/day: 0.00   Average packs/day: 1 pack/day for 10.0 years (10.0 ttl pk-yrs)   Types: Cigarettes   Start date: 03/29/1981   Quit date: 03/30/1991   Years since quitting: 31.7  Smokeless Tobacco Never  Tobacco Comments   quit smoking in Mar 1993   Counseling given: Not Answered Tobacco comments: quit smoking in Mar 1993   Outpatient Medications Prior to Visit  Medication Sig Dispense Refill   ALPRAZolam (XANAX) 0.5 MG tablet TAKE 1 TABLET BY MOUTH AT BEDTIME 30 tablet 2   aspirin EC 81 MG tablet Take 1 tablet (81 mg total) by mouth daily. Swallow whole. 90 tablet 3   benzonatate (TESSALON) 100 MG capsule Take 1  capsule (100 mg total) by mouth every 8 (eight) hours. 21 capsule 0   Budeson-Glycopyrrol-Formoterol (BREZTRI AEROSPHERE) 160-9-4.8 MCG/ACT AERO Inhale 2 puffs into the lungs 2 (two) times daily.     empagliflozin (JARDIANCE) 10 MG TABS tablet Take 1 tablet (10 mg total) by mouth daily before breakfast. 90 tablet 3   furosemide (LASIX) 20 MG tablet Take 1 tablet by mouth once daily 90 tablet 0   ibandronate (BONIVA) 150 MG tablet Take 1 tablet (150 mg total) by mouth every 30 (thirty) days. Take in the morning with a full glass of water, on an empty stomach, and do not take anything else by mouth or lie down for the next 30 min. 1 tablet 11   levothyroxine (SYNTHROID) 100 MCG tablet Take 1 tablet by mouth once daily 90 tablet 0   losartan (COZAAR) 100 MG tablet Take 1 tablet (100 mg total) by mouth at bedtime. 90 tablet 3   metoprolol succinate (TOPROL XL) 25 MG 24 hr tablet Take 0.5 tablets (12.5 mg total) by mouth at bedtime. 45 tablet 0  mupirocin ointment (BACTROBAN) 2 % Apply 1 Application topically 2 (two) times daily. 22 g 3   omeprazole (PRILOSEC) 20 MG capsule Take 1 capsule by mouth once daily 90 capsule 0   Spacer/Aero-Holding Chambers DEVI 1 each by Does not apply route in the morning and at bedtime. 1 each 0   No facility-administered medications prior to visit.      Review of Systems  Review of Systems  Constitutional: Negative.   HENT:  Positive for congestion.   Respiratory:  Positive for cough. Negative for chest tightness, shortness of breath and wheezing.   Cardiovascular: Negative.    Physical Exam  There were no vitals taken for this visit. Physical Exam Constitutional:      General: She is not in acute distress.    Appearance: Normal appearance. She is not ill-appearing.  HENT:     Head: Normocephalic and atraumatic.     Mouth/Throat:     Mouth: Mucous membranes are moist.     Pharynx: Oropharynx is clear.  Cardiovascular:     Rate and Rhythm: Normal rate  and regular rhythm.  Pulmonary:     Effort: Pulmonary effort is normal.     Breath sounds: Normal breath sounds. No wheezing, rhonchi or rales.  Musculoskeletal:        General: Normal range of motion.  Skin:    General: Skin is warm and dry.  Neurological:     General: No focal deficit present.     Mental Status: She is alert and oriented to person, place, and time. Mental status is at baseline.  Psychiatric:        Mood and Affect: Mood normal.        Behavior: Behavior normal.        Thought Content: Thought content normal.        Judgment: Judgment normal.      Lab Results:  CBC    Component Value Date/Time   WBC 5.6 03/21/2022 0910   RBC 4.83 03/21/2022 0910   HGB 13.6 03/21/2022 0910   HCT 42.3 03/21/2022 0910   PLT 177 03/21/2022 0910   MCV 87.6 03/21/2022 0910   MCH 28.2 03/21/2022 0910   MCHC 32.2 03/21/2022 0910   RDW 14.1 03/21/2022 0910   LYMPHSABS 1,786 03/21/2022 0910   MONOABS 0.5 02/26/2018 0949   EOSABS 140 03/21/2022 0910   BASOSABS 22 03/21/2022 0910    BMET    Component Value Date/Time   NA 140 09/28/2022 0932   NA 143 11/03/2021 1207   K 5.3 09/28/2022 0932   CL 106 09/28/2022 0932   CO2 25 09/28/2022 0932   GLUCOSE 86 09/28/2022 0932   BUN 24 10/12/2022 1033   BUN 24 11/03/2021 1207   CREATININE 1.81 (H) 10/12/2022 1033   CALCIUM 9.8 09/28/2022 0932   GFRNONAA 34 (L) 03/18/2020 1209   GFRAA 40 (L) 03/18/2020 1209    BNP    Component Value Date/Time   BNP 853 (H) 07/15/2021 1224    ProBNP No results found for: "PROBNP"  Imaging: No results found.   Assessment & Plan:    Moderate persistent asthma  - Stable on Breztri Aerosphere and Albuterol nebulizer as needed. Patient prefers nebulizer for faster relief but finds Breztri more convenient. CXR in October 2024 showed showed no acute findings, large retrocardiac haital hernia, elevated right hemidiaphragm -Refill Breztri prescription through patient assistance  program.  Reflux - Stable; Managed with Prilosec as needed.  Cough - Recent hoarseness and cough,  possibly due to weather changes. No current over-the-counter medications for cough. -Prescribe Promethazine DM, take four times a day as needed for cough. -Prescribe Fluticasone nasal spray, one spray per nostril daily until symptoms improve.  Follow-up in one year.      Glenford Bayley, NP 12/05/2022

## 2022-12-05 NOTE — Patient Instructions (Signed)
-  CHRONIC OBSTRUCTIVE PULMONARY DISEASE (COPD): COPD is a chronic lung condition that makes it hard to breathe. You are stable on your current medications, Breztri (2 puffs morning and evening) and Albuterol nebulizer as needed. Continue using both as directed. We will refill your Breztri prescription through the patient assistance program.  -REFLUX: Reflux occurs when stomach acid moves up into your esophagus, causing discomfort. You are managing this with Prilosec as needed. Continue taking it when you experience symptoms.  -COUGH: Your recent hoarseness and cough may be due to weather changes. We have prescribed Promethazine DM to take four times a day as needed for your cough and a nasal spray to use once daily until your symptoms improve.  Rx: Refill Breztri / [place]  Follow-up 1 year with Dr. Tonia Brooms or Waynetta Sandy NP

## 2022-12-11 ENCOUNTER — Other Ambulatory Visit: Payer: Self-pay | Admitting: Internal Medicine

## 2022-12-13 ENCOUNTER — Other Ambulatory Visit: Payer: Self-pay | Admitting: *Deleted

## 2022-12-13 MED ORDER — LOSARTAN POTASSIUM 100 MG PO TABS
100.0000 mg | ORAL_TABLET | Freq: Every day | ORAL | 3 refills | Status: AC
Start: 2022-12-13 — End: ?

## 2023-02-07 ENCOUNTER — Other Ambulatory Visit: Payer: Self-pay | Admitting: Internal Medicine

## 2023-02-22 DIAGNOSIS — H353111 Nonexudative age-related macular degeneration, right eye, early dry stage: Secondary | ICD-10-CM | POA: Diagnosis not present

## 2023-02-22 DIAGNOSIS — H52203 Unspecified astigmatism, bilateral: Secondary | ICD-10-CM | POA: Diagnosis not present

## 2023-03-04 ENCOUNTER — Other Ambulatory Visit: Payer: Self-pay | Admitting: Internal Medicine

## 2023-03-12 ENCOUNTER — Other Ambulatory Visit: Payer: Self-pay | Admitting: Internal Medicine

## 2023-03-29 ENCOUNTER — Other Ambulatory Visit: Payer: Medicare Other

## 2023-03-29 DIAGNOSIS — N184 Chronic kidney disease, stage 4 (severe): Secondary | ICD-10-CM

## 2023-03-29 DIAGNOSIS — R600 Localized edema: Secondary | ICD-10-CM

## 2023-03-29 DIAGNOSIS — Z0000 Encounter for general adult medical examination without abnormal findings: Secondary | ICD-10-CM

## 2023-03-29 DIAGNOSIS — I5023 Acute on chronic systolic (congestive) heart failure: Secondary | ICD-10-CM

## 2023-03-29 DIAGNOSIS — I10 Essential (primary) hypertension: Secondary | ICD-10-CM

## 2023-03-29 DIAGNOSIS — E039 Hypothyroidism, unspecified: Secondary | ICD-10-CM

## 2023-03-29 DIAGNOSIS — R7989 Other specified abnormal findings of blood chemistry: Secondary | ICD-10-CM

## 2023-03-29 NOTE — Progress Notes (Signed)
 Lab only

## 2023-03-30 LAB — COMPREHENSIVE METABOLIC PANEL
AG Ratio: 2 (calc) (ref 1.0–2.5)
ALT: 10 U/L (ref 6–29)
AST: 17 U/L (ref 10–35)
Albumin: 4.2 g/dL (ref 3.6–5.1)
Alkaline phosphatase (APISO): 54 U/L (ref 37–153)
BUN/Creatinine Ratio: 18 (calc) (ref 6–22)
BUN: 28 mg/dL — ABNORMAL HIGH (ref 7–25)
CO2: 28 mmol/L (ref 20–32)
Calcium: 9.2 mg/dL (ref 8.6–10.4)
Chloride: 106 mmol/L (ref 98–110)
Creat: 1.54 mg/dL — ABNORMAL HIGH (ref 0.60–0.95)
Globulin: 2.1 g/dL (ref 1.9–3.7)
Glucose, Bld: 79 mg/dL (ref 65–99)
Potassium: 4.4 mmol/L (ref 3.5–5.3)
Sodium: 143 mmol/L (ref 135–146)
Total Bilirubin: 0.6 mg/dL (ref 0.2–1.2)
Total Protein: 6.3 g/dL (ref 6.1–8.1)
eGFR: 33 mL/min/{1.73_m2} — ABNORMAL LOW (ref >=60)

## 2023-03-30 LAB — LIPID PANEL
Cholesterol: 193 mg/dL (ref <200)
HDL: 78 mg/dL (ref >=50)
LDL Cholesterol (Calc): 103 mg/dL — ABNORMAL HIGH
Non-HDL Cholesterol (Calc): 115 mg/dL (ref <130)
Total CHOL/HDL Ratio: 2.5 (calc) (ref <5.0)
Triglycerides: 42 mg/dL (ref <150)

## 2023-03-30 LAB — CBC WITH DIFFERENTIAL/PLATELET
Absolute Lymphocytes: 1575 {cells}/uL (ref 850–3900)
Absolute Monocytes: 614 {cells}/uL (ref 200–950)
Basophils Absolute: 37 {cells}/uL (ref 0–200)
Basophils Relative: 0.6 %
Eosinophils Absolute: 99 {cells}/uL (ref 15–500)
Eosinophils Relative: 1.6 %
HCT: 40.9 % (ref 35.0–45.0)
Hemoglobin: 13 g/dL (ref 11.7–15.5)
MCH: 27.4 pg (ref 27.0–33.0)
MCHC: 31.8 g/dL — ABNORMAL LOW (ref 32.0–36.0)
MCV: 86.3 fL (ref 80.0–100.0)
MPV: 10.6 fL (ref 7.5–12.5)
Monocytes Relative: 9.9 %
Neutro Abs: 3875 {cells}/uL (ref 1500–7800)
Neutrophils Relative %: 62.5 %
Platelets: 160 10*3/uL (ref 140–400)
RBC: 4.74 10*6/uL (ref 3.80–5.10)
RDW: 12.8 % (ref 11.0–15.0)
Total Lymphocyte: 25.4 %
WBC: 6.2 10*3/uL (ref 3.8–10.8)

## 2023-03-30 LAB — HEMOGLOBIN A1C
Hgb A1c MFr Bld: 5.6 %{Hb} (ref <5.7)
Mean Plasma Glucose: 114 mg/dL
eAG (mmol/L): 6.3 mmol/L

## 2023-03-30 LAB — TSH: TSH: 0.43 m[IU]/L (ref 0.40–4.50)

## 2023-04-03 ENCOUNTER — Encounter: Payer: Self-pay | Admitting: Internal Medicine

## 2023-04-03 ENCOUNTER — Ambulatory Visit (INDEPENDENT_AMBULATORY_CARE_PROVIDER_SITE_OTHER): Payer: Medicare Other | Admitting: Internal Medicine

## 2023-04-03 VITALS — BP 126/80 | HR 63 | Temp 97.6°F | Resp 12 | Ht 63.0 in | Wt 118.8 lb

## 2023-04-03 DIAGNOSIS — N184 Chronic kidney disease, stage 4 (severe): Secondary | ICD-10-CM

## 2023-04-03 DIAGNOSIS — I5023 Acute on chronic systolic (congestive) heart failure: Secondary | ICD-10-CM | POA: Diagnosis not present

## 2023-04-03 DIAGNOSIS — M1711 Unilateral primary osteoarthritis, right knee: Secondary | ICD-10-CM

## 2023-04-03 DIAGNOSIS — R8281 Pyuria: Secondary | ICD-10-CM

## 2023-04-03 DIAGNOSIS — Z0000 Encounter for general adult medical examination without abnormal findings: Secondary | ICD-10-CM | POA: Diagnosis not present

## 2023-04-03 DIAGNOSIS — F5105 Insomnia due to other mental disorder: Secondary | ICD-10-CM

## 2023-04-03 DIAGNOSIS — E039 Hypothyroidism, unspecified: Secondary | ICD-10-CM

## 2023-04-03 DIAGNOSIS — I10 Essential (primary) hypertension: Secondary | ICD-10-CM

## 2023-04-03 DIAGNOSIS — F409 Phobic anxiety disorder, unspecified: Secondary | ICD-10-CM

## 2023-04-03 DIAGNOSIS — F32A Depression, unspecified: Secondary | ICD-10-CM

## 2023-04-03 DIAGNOSIS — I8393 Asymptomatic varicose veins of bilateral lower extremities: Secondary | ICD-10-CM

## 2023-04-03 DIAGNOSIS — K4090 Unilateral inguinal hernia, without obstruction or gangrene, not specified as recurrent: Secondary | ICD-10-CM

## 2023-04-03 DIAGNOSIS — M810 Age-related osteoporosis without current pathological fracture: Secondary | ICD-10-CM

## 2023-04-03 DIAGNOSIS — Z8709 Personal history of other diseases of the respiratory system: Secondary | ICD-10-CM

## 2023-04-03 DIAGNOSIS — R7302 Impaired glucose tolerance (oral): Secondary | ICD-10-CM | POA: Diagnosis not present

## 2023-04-03 DIAGNOSIS — I340 Nonrheumatic mitral (valve) insufficiency: Secondary | ICD-10-CM

## 2023-04-03 LAB — POCT URINALYSIS DIP (MANUAL ENTRY)
Bilirubin, UA: NEGATIVE
Blood, UA: NEGATIVE
Glucose, UA: 100 mg/dL — AB
Ketones, POC UA: NEGATIVE mg/dL
Nitrite, UA: NEGATIVE
Protein Ur, POC: NEGATIVE mg/dL
Spec Grav, UA: 1.015 (ref 1.010–1.025)
Urobilinogen, UA: 0.2 U/dL
pH, UA: 5 (ref 5.0–8.0)

## 2023-04-03 NOTE — Progress Notes (Signed)
 Annual Wellness Visit   Patient Care Team: Jaquail Mclees, Luanna Cole, MD as PCP - General (Internal Medicine) Orbie Pyo, MD as PCP - Cardiology (Cardiology)  Visit Date: 04/03/23   Chief Complaint  Patient presents with   Annual Exam   Subjective:  Patient: Terri Bridges, Female DOB: 25-Oct-1934, 89 y.o. MRN: [id number]  Terri Bridges is a 89 y.o. Female who presents today for her Annual Wellness Visit. Patient has history of Chronic Depression, Hypertension, Hypothyroidism, Allergic Rhinitis, GE Reflux, Recurrent Sinusitis, COPD, and Osteoporosis.  History of Hypertension treated with Metoprolol succinate 12.5 mg nightly, Losartan 100 mg nightly, and Lasix 20 mg daily. Blood Pressure: normotensive today at 126/80.   History of Mixed Obstructive, Restrictive Lung Disease; Asthma managed with Breztri inhaler twice daily and Flonase daily. Followed by Pulmonology.   History of Anxiety treated with Xanax 0.5 mg nightly.   History of GE Reflex treated with Prilosec 20 mg daily.   History of Hypothyroidism treated with Levothyroxine 100 mcg daily. 03/29/2023 TSH: 0.43.   History of Chronic Kidney Disease stage 4 treated with Jardiance 10 mg daily. Followed by [facility], which she says she has an upcoming appointment for 04/20/2023. Renal US 10/2022 showed no hydronephrosis and mildly complex left renal cyst measuring 4 cm. She says that she only drinks water.   Labs 03/29/2023 CBC, compared to 03/2022: MCHC 31.8, decreased from 32.2; otherwise WNL.  CMP, compared to 03/2022: BUN 28, decreased from 62; Creat 1.54, improved from 1.78; eGFR 33, elevated from 28; otherwise WNL.  Lipid Panel, compared to 03/2022: LDL 103, elevated from 95; otherwise WNL.  HgbA1c, compared to 03/30/2022: 5.6, decreased from 5.7.  Overdue for Mammogram since 2020. Last completed 03/07/17 w/o mammographic evidence of malignancy.   Colonoscopy; Upper Endoscopy 2008 by Dr. Randa Evens  which showed hiatal hernia.   Bone Density 2019 T-score -3. History of Osteoporosis treated with Boniva 150 mg monthly. Because of GE reflux it was thought unlikely she would tolerate Fosamax; in 2002 she was taking Actonel for a while but discontinued it.  Vaccine Counseling: UTD on Flu, PNA, and Tdap.  Past Medical History:  Diagnosis Date   Allergic rhinitis    Allergy    uses Flonase daily as needed   Anxiety    takes Xanax daily as needed   Arthritis    "back" (04/21/2013)   Chronic lower back pain    Constipation    takes an OTC stool softener   Depression    takes Zoloft daily   Dry eyes    uses eye drops   Exertional shortness of breath    GERD (gastroesophageal reflux disease)    takes Omeprazole daily and Protonix daily as needed   H/O hiatal hernia    Headache(784.0)    sinus   Heart murmur    years ago   History of bronchitis    last time many yrs ago   Hypertension    takes Losartan and Cardizem daily   Hypothyroidism    takes Synthroid daily   Joint pain    Joint swelling    Nocturia    Osteoporosis    Pressure in chest 08/23/13   fell and started having chest pressure, will have ECHO and stress test 09/04/13 before surgery   Ringing in ears    sees Dr.Byers for this   Scoliosis    Urinary frequency    Urinary incontinence    Urinary urgency   Medical/Surgical History Narrative:  2021 - suffered a Superficial Laceration Right Anterior Lower Leg, treated with Steri-Strips w/o evidence of secondary infection.  Tetanus update was given in August 2021.    2017 - PFTs   2015 - hx of Left Knee Arthroplasty by Dr. Valentina Gu. Had revision of surgery 4 months later due to instability.    2013 - Left Cataract Extraction   2011 - Right Rotator Cuff Repair   2005 - Dr. Daleen Squibb saw her for Palpitations and placed her on Cardizem w/ improvement in blood pressure and palpitations.  She had cardiac cath at that time showing no significant coronary disease  2004 -  Arthroscopic Surgery, Right Knee. Bilateral Grommet Tubes placed in February.  1998 - Exercise Tolerance Test to evaluate Chest Pain with Dr. Lewis Shock in November. Study was negative.   1991/1993 - Sinus Surgery by Dr. Garrison [place]. Subsequently suffered a periorbital abscess which was drained by Right Intranasal Ethmoidectomy in March 1993.  Also had ethmoidectomy, left sphenoidectomy and transnasal frontal sinusotomy March 1993. Hx of Smoking, former 1-2 PPD x 20+ years, quit 1993.   Other - Hx of: asthma and allergic rhinitis treated by Dr. Lucie Leather;  Family History  Problem Relation Age of Onset   Heart failure Mother    Heart failure Father    Hypertension Sister    Neuropathy Neg Hx   Family History Narrative: Father, deceased aged 15, of an MI.   Mother, deceased aged 38, of an MI. Sister deceased in 2022/06/29at age 41 due to age and debilitation.  Son, in December 2014 suffered an arrest in the backyard and subsequently expired presumably of an MI, with hx/ of Drug Abuse  Granddaughter who has a leg deformity  Social History   Social History Narrative   Originally from [place].    Previously worked in Production designer, theatre/television/film for [employer].    Has 2 dogs currently.    Remote exposure to parrots in her current home.   No known mold exposure.    Remote travel to [facility] & [place].   Has 1 indoor plant.    Caffeine use: No soda   2 cups coffee every morning.   Divorced and resides alone. 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  Prior to that she worked at [employer].  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 She gets her lot rent for free in exchange for doing this work and she feels safe there. She does not smoke - quit 03/1991 - and does not consume alcohol. In December 2014 her son suffered an arrest in the backyard and subsequently expired presumably of an MI -- he had a prior history  of drug abuse. Her granddaughter, who has a leg deformity and has had multiple surgeries at [facility] in [place], has moved to the [place] area with her boyfriend and now has a Development worker, international aid.   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         Review of Systems  Constitutional:  Negative for chills, fever, malaise/fatigue and weight loss.  HENT:  Negative for hearing loss, sinus pain and sore throat.   Respiratory:  Negative for cough, hemoptysis and shortness of breath.   Cardiovascular:  Negative for chest pain, palpitations, leg swelling and PND.  Gastrointestinal:  Negative for abdominal pain, constipation, diarrhea, heartburn, nausea and  vomiting.  Genitourinary:  Negative for dysuria, frequency and urgency.  Musculoskeletal:  Negative for back pain, myalgias and neck pain.  Skin:  Negative for itching and rash.  Neurological:  Negative for dizziness, tingling, seizures and headaches.  Endo/Heme/Allergies:  Negative for polydipsia.  Psychiatric/Behavioral:  Negative for depression. The patient is not nervous/anxious.     Objective:  Vitals: BP 126/80 (BP Location: Left Arm, Patient Position: Sitting, Cuff Size: Normal)   Pulse 63   Temp 97.6 F (36.4 C) (Oral)   Resp 12   Ht 5\' 3"  (1.6 m)   Wt 118 lb 12.8 oz (53.9 kg)   SpO2 99%   BMI 21.04 kg/m  Physical Exam Vitals and nursing note reviewed.  Constitutional:      General: She is not in acute distress.    Appearance: Normal appearance. She is not ill-appearing or toxic-appearing.  HENT:     Head: Normocephalic and atraumatic.     Right Ear: Hearing, tympanic membrane, ear canal and external ear normal.     Left Ear: Hearing, tympanic membrane, ear canal and external ear normal.     Mouth/Throat:     Pharynx: Oropharynx is clear.  Eyes:     Extraocular Movements: Extraocular movements intact.     Pupils: Pupils are equal, round, and reactive to light.  Neck:      Thyroid: No thyroid mass, thyromegaly or thyroid tenderness.     Vascular: No carotid bruit.  Cardiovascular:     Rate and Rhythm: Normal rate and regular rhythm. No extrasystoles are present.    Pulses:          Dorsalis pedis pulses are 2+ on the right side and 2+ on the left side.     Heart sounds: Normal heart sounds. No murmur heard.    No friction rub. No gallop.  Pulmonary:     Effort: Pulmonary effort is normal.     Breath sounds: Normal breath sounds. No decreased breath sounds, wheezing, rhonchi or rales.  Chest:     Chest wall: No mass.     Comments: Breast Exam deferred  Abdominal:     Palpations: Abdomen is soft. There is no hepatomegaly, splenomegaly or mass.     Tenderness: There is no abdominal tenderness.     Hernia: No hernia is present.  Genitourinary:    Comments: Pelvic exam deferred Musculoskeletal:     Cervical back: Normal range of motion.     Right lower leg: No edema.     Left lower leg: No edema.     Comments: Kyphosis noted  Lymphadenopathy:     Cervical: No cervical adenopathy.     Upper Body:     Right upper body: No supraclavicular adenopathy.     Left upper body: No supraclavicular adenopathy.  Skin:    General: Skin is warm and dry.     Findings: Ecchymosis (Lower Extremities) present.  Neurological:     General: No focal deficit present.     Mental Status: She is alert and oriented to person, place, and time. Mental status is at baseline.     Sensory: Sensation is intact.     Motor: Motor function is intact. No weakness.     Deep Tendon Reflexes: Reflexes are normal and symmetric.  Psychiatric:        Attention and Perception: Attention normal.        Mood and Affect: Mood normal.        Speech: Speech normal.  Behavior: Behavior normal.        Thought Content: Thought content normal.        Cognition and Memory: Cognition normal.        Judgment: Judgment normal.   Most Recent Fall Risk Assessment:    04/03/2023    9:57 AM   Fall Risk   Falls in the past year? 0  Number falls in past yr: 0  Injury with Fall? 0   Most Recent Depression Screenings:    04/03/2023    9:58 AM 03/30/2022   10:20 AM  PHQ 2/9 Scores  PHQ - 2 Score 0 0  PHQ- 9 Score 0    Most Recent Cognitive Screening:    03/30/2022   10:23 AM  6CIT Screen  What Year? 0 points  What month? 0 points  What time? 0 points  Count back from 20 0 points  Months in reverse 0 points  Repeat phrase 0 points  Total Score 0 points   Results:  Studies Obtained And Personally Reviewed By Me:  Overdue for Mammogram since 2020. Last completed 03/07/17 w/o mammographic evidence of malignancy.   Colonoscopy; Upper Endoscopy 2008 by Dr. Randa Evens which showed hiatal hernia.   Bone Density 2019 T-score -3.   Labs:     Component Value Date/Time   NA 143 03/29/2023 0955   NA 143 11/03/2021 1207   K 4.4 03/29/2023 0955   CL 106 03/29/2023 0955   CO2 28 03/29/2023 0955   GLUCOSE 79 03/29/2023 0955   BUN 28 (H) 03/29/2023 0955   BUN 24 11/03/2021 1207   CREATININE 1.54 (H) 03/29/2023 0955   CALCIUM 9.2 03/29/2023 0955   PROT 6.3 03/29/2023 0955   ALBUMIN 3.7 01/20/2016 1027   AST 17 03/29/2023 0955   ALT 10 03/29/2023 0955   ALKPHOS 67 01/20/2016 1027   BILITOT 0.6 03/29/2023 0955   GFRNONAA 34 (L) 03/18/2020 1209   GFRAA 40 (L) 03/18/2020 1209    Lab Results  Component Value Date   WBC 6.2 03/29/2023   HGB 13.0 03/29/2023   HCT 40.9 03/29/2023   MCV 86.3 03/29/2023   PLT 160 03/29/2023   Lab Results  Component Value Date   CHOL 193 03/29/2023   HDL 78 03/29/2023   LDLCALC 103 (H) 03/29/2023   TRIG 42 03/29/2023   CHOLHDL 2.5 03/29/2023   Lab Results  Component Value Date   HGBA1C 5.6 03/29/2023    Lab Results  Component Value Date   TSH 0.43 03/29/2023    Results for orders placed or performed in visit on 04/03/23  POCT urinalysis dipstick  Result Value Ref Range   Color, UA yellow yellow   Clarity, UA cloudy (A) clear    Glucose, UA =100 (A) negative mg/dL   Bilirubin, UA negative negative   Ketones, POC UA negative negative mg/dL   Spec Grav, UA 8.295 6.213 - 1.025   Blood, UA negative negative   pH, UA 5.0 5.0 - 8.0   Protein Ur, POC negative negative mg/dL   Urobilinogen, UA 0.2 0.2 or 1.0 E.U./dL   Nitrite, UA Negative Negative   Leukocytes, UA Moderate (2+) (A) Negative   Assessment & Plan:   Orders Placed This Encounter  Procedures   Urine Culture   POCT urinalysis dipstick  Other Labs Reviewed today: CBC, compared to 03/2022: MCHC 31.8, decreased from 32.2; otherwise WNL.  CMP, compared to 03/2022: BUN 28, decreased from 62; Creat 1.54, improved from 1.78; eGFR 33, elevated from  28; otherwise WNL.  Lipid Panel, compared to 03/2022: LDL 103, elevated from 95; otherwise WNL.  HgbA1c, compared to 03/30/2022: 5.6, decreased from 5.7.  Leukocytes in Urine: UA today abnormal, moderately leukocytic, sending off for culture.   Hypertension treated with Metoprolol succinate 12.5 mg nightly, Losartan 100 mg nightly, and Lasix 20 mg daily. Blood Pressure: normotensive today at 126/80.   Mixed Obstructive, Restrictive Lung Disease; Asthma managed with Breztri inhaler twice daily and Flonase daily. Followed by Pulmonology.   Anxiety treated with Xanax 0.5 mg nightly.   GE Reflex treated with Prilosec 20 mg daily.   Hypothyroidism treated with Levothyroxine 100 mcg daily. 03/29/2023 TSH: 0.43.   Chronic Kidney Disease stage 4 treated with Jardiance 10 mg daily. Followed by [facility], which she says she has an upcoming appointment for 04/20/2023. Renal US 10/2022 showed no hydronephrosis and mildly complex left renal cyst measuring 4 cm. She says that she only drinks water.   Overdue for Mammogram since 2020. Last completed 03/07/17 w/o mammographic evidence of malignancy.   Colonoscopy; Upper Endoscopy 2008 by Dr. Randa Evens which showed hiatal hernia.   Bone Density 2019 T-score -3.    Osteoporosis treated with Boniva 150 mg monthly.   Vaccine Counseling: UTD on Flu, PNA, and Tdap.    Annual wellness visit done today including the all of the following: Reviewed patient's Family Medical History Reviewed and updated list of patient's medical providers Assessment of cognitive impairment was done Assessed patient's functional ability Established a written schedule for health screening services Health Risk Assessent Completed and Reviewed  Discussed health benefits of physical activity, and encouraged her to engage in regular exercise appropriate for her age and condition.    I,Emily Lagle,acting as a Neurosurgeon for Margaree Mackintosh, MD.,have documented all relevant documentation on the behalf of Margaree Mackintosh, MD,as directed by  Margaree Mackintosh, MD while in the presence of Margaree Mackintosh, MD.   I, Margaree Mackintosh, MD, have reviewed all documentation for this visit. The documentation on 04/04/23 for the exam, diagnosis, procedures, and orders are all accurate and complete.

## 2023-04-04 ENCOUNTER — Encounter: Payer: Self-pay | Admitting: Internal Medicine

## 2023-04-04 NOTE — Patient Instructions (Addendum)
 This is the best I have seen her in some time both physically and mentally. Labs are stable. No change in meds. Follow up in 6 months here.

## 2023-04-05 LAB — URINE CULTURE
MICRO NUMBER:: 16244472
Result:: NO GROWTH
SPECIMEN QUALITY:: ADEQUATE

## 2023-04-07 ENCOUNTER — Telehealth: Admitting: Physician Assistant

## 2023-04-07 ENCOUNTER — Ambulatory Visit (HOSPITAL_COMMUNITY)
Admission: EM | Admit: 2023-04-07 | Discharge: 2023-04-07 | Disposition: A | Discharge status: Home / Self Care | Attending: Emergency Medicine | Admitting: Emergency Medicine

## 2023-04-07 ENCOUNTER — Encounter (HOSPITAL_COMMUNITY): Payer: Self-pay | Admitting: Emergency Medicine

## 2023-04-07 DIAGNOSIS — S51811A Laceration without foreign body of right forearm, initial encounter: Secondary | ICD-10-CM | POA: Diagnosis not present

## 2023-04-07 DIAGNOSIS — W19XXXA Unspecified fall, initial encounter: Secondary | ICD-10-CM | POA: Diagnosis not present

## 2023-04-07 DIAGNOSIS — S41111A Laceration without foreign body of right upper arm, initial encounter: Secondary | ICD-10-CM | POA: Diagnosis not present

## 2023-04-07 DIAGNOSIS — Z203 Contact with and (suspected) exposure to rabies: Secondary | ICD-10-CM | POA: Diagnosis not present

## 2023-04-07 MED ORDER — TETANUS-DIPHTH-ACELL PERTUSSIS 5-2.5-18.5 LF-MCG/0.5 IM SUSY
0.5000 mL | PREFILLED_SYRINGE | Freq: Once | INTRAMUSCULAR | Status: AC
  Administered 2023-04-07: 0.5 mL via INTRAMUSCULAR

## 2023-04-07 MED ORDER — TETANUS-DIPHTH-ACELL PERTUSSIS 5-2.5-18.5 LF-MCG/0.5 IM SUSY
PREFILLED_SYRINGE | INTRAMUSCULAR | Status: AC
  Filled 2023-04-07: qty 0.5

## 2023-04-07 NOTE — ED Provider Notes (Signed)
 [facility]    CSN: [id number] Arrival date & time: 04/07/23  1002      History   Chief Complaint Chief Complaint  Patient presents with   Fall   Arm Injury    HPI Terri Bridges is a 89 y.o. female.   Patient presents to clinic over concerns of multiple skin tears to her right forearm and hands.  She had a fall around midnight last night in her bathroom, caught her arm when she tried to grab out, thinks she hit the toilet paper holder. Multiple skin tears. Some right sided arm discomfort, thinks from where she tried to grab herself. She does not think she broke anything, elbow does not hurt or shoulder with range of motion. Right sided chest wall pain, denies bruising, shortness of breath or swelling.  Denies hitting head or loss of consciousness. Patient did E-visit this morning where she was advised to come to UC to rule out further injuries.   Unsure when her last Tdap was.  On record review it looks like it was in 2005.  The history is provided by the patient and medical records.  Fall  Arm Injury   Past Medical History:  Diagnosis Date   Allergic rhinitis    Allergy    uses Flonase daily as needed   Anxiety    takes Xanax daily as needed   Arthritis    "back" (04/21/2013)   Chronic lower back pain    Constipation    takes an OTC stool softener   Depression    takes Zoloft daily   Dry eyes    uses eye drops   Exertional shortness of breath    GERD (gastroesophageal reflux disease)    takes Omeprazole daily and Protonix daily as needed   H/O hiatal hernia    Headache(784.0)    sinus   Heart murmur    years ago   History of bronchitis    last time many yrs ago   Hypertension    takes Losartan and Cardizem daily   Hypothyroidism    takes Synthroid daily   Joint pain    Joint swelling    Nocturia    Osteoporosis    Pressure in chest 08/23/13   fell and started having chest pressure, will have ECHO and stress test 09/04/13 before  surgery   Ringing in ears    sees Dr.Byers for this   Scoliosis    Urinary frequency    Urinary incontinence    Urinary urgency     Patient Active Problem List   Diagnosis Date Noted   HFrEF (heart failure with reduced ejection fraction) (HCC) 10/10/2021   Asthma 03/26/2018   Left inguinal hernia 08/05/2017   Osteoarthritis of right knee 08/05/2017   Chronic pansinusitis 05/31/2016   Eustachian tube dysfunction, bilateral 05/31/2016   Sensorineural hearing loss (SNHL), bilateral 05/31/2016   Carpal tunnel syndrome 05/12/2015   Degenerative arthritis of finger 05/12/2015   Primary arthrosis of first carpometacarpal joints, bilateral 05/12/2015   Chronic kidney disease 05/22/2014   Left knee pain 09/08/2013   H/O total knee replacement 08/11/2013   S/P total knee arthroplasty 04/21/2013   Gonalgia 04/11/2013   Osteoporosis 03/24/2012   At high risk for falls 02/12/2012   Leg pain, bilateral 02/12/2012   Anxiety 03/06/2011   COPD (chronic obstructive pulmonary disease) (HCC) 01/08/2011   Hypertension 01/05/2011   Hypothyroidism 01/05/2011   Recurrent sinusitis 01/05/2011   Allergic rhinitis 01/05/2011   Depression 01/05/2011  GE reflux 01/05/2011    Past Surgical History:  Procedure Laterality Date   CARDIAC CATHETERIZATION  2005   CATARACT EXTRACTION W/ INTRAOCULAR LENS  IMPLANT, BILATERAL Bilateral    COLONOSCOPY     ESOPHAGOGASTRODUODENOSCOPY     EXCISIONAL TOTAL KNEE ARTHROPLASTY Left 09/08/2013   Procedure: EXCISIONAL TOTAL KNEE ARTHROPLASTY POLYEXCHANGE;  Surgeon: Dannielle Huh, MD;  Location: [facility];  Service: Orthopedics;  Laterality: Left;   EYE SURGERY     JOINT REPLACEMENT     KNEE ARTHROSCOPY Right    NASAL SINUS SURGERY     x 4   SHOULDER ARTHROSCOPY W/ ROTATOR CUFF REPAIR Right    TONSILLECTOMY  1940's   TOTAL KNEE ARTHROPLASTY Left 04/21/2013   TOTAL KNEE ARTHROPLASTY Left 04/21/2013   Procedure: TOTAL KNEE ARTHROPLASTY;  Surgeon: Dannielle Huh, MD;   Location: [facility];  Service: Orthopedics;  Laterality: Left;   TUBAL LIGATION      OB History   No obstetric history on file.      Home Medications    Prior to Admission medications   Medication Sig Start Date End Date Taking? Authorizing Provider  ALPRAZolam Prudy Feeler) 0.5 MG tablet TAKE 1 TABLET BY MOUTH AT BEDTIME 02/07/23   Margaree Mackintosh, MD  aspirin EC 81 MG tablet Take 1 tablet (81 mg total) by mouth daily. Swallow whole. 07/22/21   Orbie Pyo, MD  Budeson-Glycopyrrol-Formoterol (BREZTRI AEROSPHERE) 160-9-4.8 MCG/ACT AERO Inhale 2 puffs into the lungs 2 (two) times daily. 12/05/22   Glenford Bayley, NP  empagliflozin (JARDIANCE) 10 MG TABS tablet Take 1 tablet (10 mg total) by mouth daily before breakfast. 11/13/22   Orbie Pyo, MD  fluticasone (FLONASE) 50 MCG/ACT nasal spray Place 1 spray into both nostrils daily. 12/05/22   Glenford Bayley, NP  furosemide (LASIX) 20 MG tablet Take 1 tablet by mouth once daily 03/05/23   Margaree Mackintosh, MD  ibandronate (BONIVA) 150 MG tablet Take 1 tablet (150 mg total) by mouth every 30 (thirty) days. Take in the morning with a full glass of water, on an empty stomach, and do not take anything else by mouth or lie down for the next 30 min. 04/11/22   Margaree Mackintosh, MD  levothyroxine (SYNTHROID) 100 MCG tablet Take 1 tablet by mouth once daily 03/12/23   Margaree Mackintosh, MD  losartan (COZAAR) 100 MG tablet Take 1 tablet (100 mg total) by mouth at bedtime. 12/13/22   Orbie Pyo, MD  metoprolol succinate (TOPROL XL) 25 MG 24 hr tablet Take 0.5 tablets (12.5 mg total) by mouth at bedtime. 11/09/22   Sharlene Dory, PA-C  omeprazole (PRILOSEC) 20 MG capsule Take 1 capsule by mouth once daily 02/15/22   Margaree Mackintosh, MD  Spacer/Aero-Holding Chambers DEVI 1 each by Does not apply route in the morning and at bedtime. 10/10/21   Malena Peer D, RPH-CPP    Family History Family History  Problem Relation Age of Onset   Heart failure Mother     Heart failure Father    Hypertension Sister    Neuropathy Neg Hx     Social History Social History   Tobacco Use   Smoking status: Former    Current packs/day: 0.00    Average packs/day: 1 pack/day for 10.0 years (10.0 ttl pk-yrs)    Types: Cigarettes    Start date: 03/29/1981    Quit date: 03/30/1991    Years since quitting: 32.0   Smokeless tobacco: Never  Tobacco comments:    quit smoking in Mar 1993  Vaping Use   Vaping status: Never Used  Substance Use Topics   Alcohol use: No    Alcohol/week: 0.0 standard drinks of alcohol   Drug use: No     Allergies   Doxycycline   Review of Systems Review of Systems  Per HPI  Physical Exam Triage Vital Signs ED Triage Vitals  Encounter Vitals Group     BP 04/07/23 1016 (!) 140/76     Systolic BP Percentile --      Diastolic BP Percentile --      Pulse Rate 04/07/23 1016 60     Resp 04/07/23 1016 16     Temp 04/07/23 1016 98 F (36.7 C)     Temp Source 04/07/23 1016 Oral     SpO2 04/07/23 1016 98 %     Weight --      Height --      Head Circumference --      Peak Flow --      Pain Score 04/07/23 1014 5     Pain Loc --      Pain Education --      Exclude from Growth Chart --    No data found.  Updated Vital Signs BP (!) 140/76 (BP Location: Left Arm)   Pulse 60   Temp 98 F (36.7 C) (Oral)   Resp 16   SpO2 98%   Visual Acuity Right Eye Distance:   Left Eye Distance:   Bilateral Distance:    Right Eye Near:   Left Eye Near:    Bilateral Near:     Physical Exam Vitals and nursing note reviewed.  Constitutional:      Appearance: Normal appearance.  HENT:     Head: Normocephalic and atraumatic.     Right Ear: External ear normal.     Left Ear: External ear normal.     Nose: Nose normal.     Mouth/Throat:     Mouth: Mucous membranes are moist.  Eyes:     Conjunctiva/sclera: Conjunctivae normal.  Cardiovascular:     Rate and Rhythm: Normal rate.  Pulmonary:     Effort: Pulmonary effort  is normal. No respiratory distress.  Musculoskeletal:        General: Tenderness present. Normal range of motion.       Arms:  Skin:    General: Skin is warm and dry.     Findings: Abrasion present.       Neurological:     General: No focal deficit present.     Mental Status: She is alert.  Psychiatric:        Mood and Affect: Mood normal.      UC Treatments / Results  Labs (all labs ordered are listed, but only abnormal results are displayed) Labs Reviewed - No data to display  EKG   Radiology No results found.  Procedures Procedures (including critical care time)  Medications Ordered in UC Medications  Tdap (BOOSTRIX) injection 0.5 mL (has no administration in time range)    Initial Impression / Assessment and Plan / UC Course  I have reviewed the triage vital signs and the nursing notes.  Pertinent labs & imaging results that were available during my care of the patient were reviewed by me and considered in my medical decision making (see chart for details).  Vitals and triage reviewed, patient is hemodynamically stable.  Multiple skin tears and abrasions to the right forearm  and 1 to the right hand.  Due to fragility of skin, not a good candidate for sutures, Dermabond or Steri-Strips.  Wound care provided in clinic.  Tdap updated.  Right sided chest wall discomfort, without shortness of breath, crepitus or deformity.  Full range of motion in the elbow and right shoulder.  Imaging deferred at this time.  Expectant teaching and wound care discussed.  Plan of care, follow-up care return precautions given, no questions at this time.     Final Clinical Impressions(s) / UC Diagnoses   Final diagnoses:  Fall, initial encounter  Skin tear of forearm without complication, right, initial encounter     Discharge Instructions      We are unable to suture your arm due to the fragility of your skin.  Please keep the area clean and dry, you can apply a small amounts of  antibacterial ointment such as Neosporin twice daily and wrapped with a bulky dressing to help prevent further injury.  It may take a few weeks for the area to heal.  You may develop soreness over the next few days from the fall, you can take 500 mg of Tylenol every 6 hours as needed for pain.  Return to clinic or follow-up with your primary care provider if you develop any warmth, drainage, streaking, fevers or signs of infection.      ED Prescriptions   None    PDMP not reviewed this encounter.   Latesia Norrington, [place], [place] 04/07/23 [phone]

## 2023-04-07 NOTE — Progress Notes (Signed)
 E-Visit for Simple Cut/Laceration  We are sorry that you have had an injury. Here is how we plan to help!  Based on what you shared with me it looks like you have a simple laceration that does not need to be repaired with stitches or tissue glue.  However, due to the fall, I do encourage you to be seen at an urgent Care to make sure you do not have any other injuries   HOME CARE: Clean the cut or scrape - Wash it well with soap and water. * avoid using hydrogen peroxide which may cause tissue damage, or impede wound healing.  Stop the bleeding - If your cut or scrape is bleeding, press a clean cloth or bandage firmly on the area for 20 minutes. You can also help slow the bleeding by holding the cut above the level of your heart.   Put a thin layer of Bacitracin antibiotic ointment on the cut or scrape. (this can be purchased at any local pharmacy- ask your pharmacist if you need assistance)   Cover the cut or scrape with a bandage or gauze. Keep the bandage clean and dry. Change the bandage 1 to 2 times every day until your cut or scrape heals.   Watch for signs that your cut or scrape is infected (redness, drainage, pain, warmth, swelling or fever)  Over the next 48 hours your wound should start to improve with less pain, less swelling and less redness. If you should develop increasing pain, swelling, redness, fever, pus from the wound you should be seen immediately to make sure this is not becoming infected.   WOUND CARE: Please keep a layer of antibiotic ointment (bacitracin preferred) on this wound at least twice a day for the next seven days and keep a sterile dressing over top of it. You may gently clean the wound with warm soap and water between dressing changes.  We strongly recommend that you have a medical provider reevaluate your wound within 2 to 3 days in person to make sure that it is healing appropriately.  Thank you for choosing an e-visit.  Your e-visit answers were  reviewed by a board certified advanced clinical practitioner to complete your personal care plan. Depending upon the condition, your plan could have included both over the counter or prescription medications.  Please review your pharmacy choice. Make sure the pharmacy is open so you can pick up prescription now. If there is a problem, you may contact your provider through [employer] and have the prescription routed to another pharmacy.  Your safety is important to [place]. If you have drug allergies check your prescription carefully.   For the next 24 hours you can use MyChart to ask questions about today's visit, request a non-urgent call back, or ask for a work or school excuse. You will get an email in the next two days asking about your experience. I hope that your e-visit has been valuable and will speed your recovery.   I have spent 5 minutes in review of e-visit questionnaire, review and updating patient chart, medical decision making and response to patient.   Kasandra Knudsen Mayers, PA-C

## 2023-04-07 NOTE — Discharge Instructions (Addendum)
 We are unable to suture your arm due to the fragility of your skin.  Please keep the area clean and dry, you can apply a small amounts of antibacterial ointment such as Neosporin twice daily and wrapped with a bulky dressing to help prevent further injury.  It may take a few weeks for the area to heal.  You may develop soreness over the next few days from the fall, you can take 500 mg of Tylenol every 6 hours as needed for pain.  Return to clinic or follow-up with your primary care provider if you develop any warmth, drainage, streaking, fevers or signs of infection.

## 2023-04-07 NOTE — ED Triage Notes (Signed)
 Pt reports got up around midnight to go to the bathroom. Reports she doesn't know what happened to cause her to fall but her bathroom is small so she was able to reach and catch herself on the bathtub. Pt has cuts on right hand and forearm. Pt had bandaged in triage that has bleeding controled. Reports that her right chest is sore. Reports iced arm/hand during the night.

## 2023-04-09 ENCOUNTER — Ambulatory Visit (HOSPITAL_COMMUNITY)
Admission: EM | Admit: 2023-04-09 | Discharge: 2023-04-09 | Disposition: A | Discharge status: Home / Self Care | Attending: Physician Assistant | Admitting: Physician Assistant

## 2023-04-09 ENCOUNTER — Encounter (HOSPITAL_COMMUNITY): Payer: Self-pay | Admitting: Emergency Medicine

## 2023-04-09 DIAGNOSIS — N184 Chronic kidney disease, stage 4 (severe): Secondary | ICD-10-CM | POA: Diagnosis not present

## 2023-04-09 DIAGNOSIS — S51811D Laceration without foreign body of right forearm, subsequent encounter: Secondary | ICD-10-CM | POA: Diagnosis not present

## 2023-04-09 DIAGNOSIS — Z5189 Encounter for other specified aftercare: Secondary | ICD-10-CM

## 2023-04-09 MED ORDER — SILVER SULFADIAZINE 1 % EX CREA
TOPICAL_CREAM | CUTANEOUS | Status: AC
Start: 2023-04-09 — End: ?
  Filled 2023-04-09: qty 85

## 2023-04-09 MED ORDER — SILVER SULFADIAZINE 1 % EX CREA: TOPICAL_CREAM | Freq: Every day | CUTANEOUS | Status: AC

## 2023-04-09 MED ORDER — SILVER SULFADIAZINE 1 % EX CREA: 1.0000 | TOPICAL_CREAM | Freq: Two times a day (BID) | CUTANEOUS | 0 refills | Status: DC

## 2023-04-09 NOTE — ED Triage Notes (Signed)
 Pt c/o severe pain on her skin tears on right forearm. Pt was seen here Saturday for her arm injury from fall during the night.

## 2023-04-09 NOTE — ED Provider Notes (Signed)
 [facility]    CSN: [id number] Arrival date & time: 04/09/23  0807      History   Chief Complaint Chief Complaint  Patient presents with   Wound Check    HPI Terri Bridges [facility] is a 89 y.o. female.   HPI   Patient presents today with concerns for skin injury that occurred after a fall over the weekend.  She was seen at urgent care on Saturday, 04/07/2023, for the same.  At that time her skin was rolled to fragile for closure and she was provided with wound cleansing as well as an updated tetanus booster. She reports today that she has concern for continued reopening of the skin tears and potential infection.  She reports whenever she tries to move her forearm or her wrist that the skin tear seem to open up further which is very painful.  She reports that she has been taking Tylenol as directed but this is not helping with pain management.    Past Medical History:  Diagnosis Date   Allergic rhinitis    Allergy    uses Flonase daily as needed   Anxiety    takes Xanax daily as needed   Arthritis    "back" (04/21/2013)   Chronic lower back pain    Constipation    takes an OTC stool softener   Depression    takes Zoloft daily   Dry eyes    uses eye drops   Exertional shortness of breath    GERD (gastroesophageal reflux disease)    takes Omeprazole daily and Protonix daily as needed   H/O hiatal hernia    Headache(784.0)    sinus   Heart murmur    years ago   History of bronchitis    last time many yrs ago   Hypertension    takes Losartan and Cardizem daily   Hypothyroidism    takes Synthroid daily   Joint pain    Joint swelling    Nocturia    Osteoporosis    Pressure in chest 08/23/13   fell and started having chest pressure, will have ECHO and stress test 09/04/13 before surgery   Ringing in ears    sees Dr.Byers for this   Scoliosis    Urinary frequency    Urinary incontinence    Urinary urgency     Patient Active Problem List    Diagnosis Date Noted   HFrEF (heart failure with reduced ejection fraction) (HCC) 10/10/2021   Asthma 03/26/2018   Left inguinal hernia 08/05/2017   Osteoarthritis of right knee 08/05/2017   Chronic pansinusitis 05/31/2016   Eustachian tube dysfunction, bilateral 05/31/2016   Sensorineural hearing loss (SNHL), bilateral 05/31/2016   Carpal tunnel syndrome 05/12/2015   Degenerative arthritis of finger 05/12/2015   Primary arthrosis of first carpometacarpal joints, bilateral 05/12/2015   Chronic kidney disease 05/22/2014   Left knee pain 09/08/2013   H/O total knee replacement 08/11/2013   S/P total knee arthroplasty 04/21/2013   Gonalgia 04/11/2013   Osteoporosis 03/24/2012   At high risk for falls 02/12/2012   Leg pain, bilateral 02/12/2012   Anxiety 03/06/2011   COPD (chronic obstructive pulmonary disease) (HCC) 01/08/2011   Hypertension 01/05/2011   Hypothyroidism 01/05/2011   Recurrent sinusitis 01/05/2011   Allergic rhinitis 01/05/2011   Depression 01/05/2011   GE reflux 01/05/2011    Past Surgical History:  Procedure Laterality Date   CARDIAC CATHETERIZATION  2005   CATARACT EXTRACTION W/ INTRAOCULAR LENS  IMPLANT, BILATERAL Bilateral  COLONOSCOPY     ESOPHAGOGASTRODUODENOSCOPY     EXCISIONAL TOTAL KNEE ARTHROPLASTY Left 09/08/2013   Procedure: EXCISIONAL TOTAL KNEE ARTHROPLASTY POLYEXCHANGE;  Surgeon: Dannielle Huh, MD;  Location: [facility];  Service: Orthopedics;  Laterality: Left;   EYE SURGERY     JOINT REPLACEMENT     KNEE ARTHROSCOPY Right    NASAL SINUS SURGERY     x 4   SHOULDER ARTHROSCOPY W/ ROTATOR CUFF REPAIR Right    TONSILLECTOMY  1940's   TOTAL KNEE ARTHROPLASTY Left 04/21/2013   TOTAL KNEE ARTHROPLASTY Left 04/21/2013   Procedure: TOTAL KNEE ARTHROPLASTY;  Surgeon: Dannielle Huh, MD;  Location: [facility];  Service: Orthopedics;  Laterality: Left;   TUBAL LIGATION      OB History   No obstetric history on file.      Home Medications    Prior to  Admission medications   Medication Sig Start Date End Date Taking? Authorizing Provider  silver sulfADIAZINE (SILVADENE) 1 % cream Apply 1 Application topically 2 (two) times daily for 7 days. 04/09/23 04/16/23 Yes Shekelia Boutin E, PA-C  ALPRAZolam (XANAX) 0.5 MG tablet TAKE 1 TABLET BY MOUTH AT BEDTIME 02/07/23   Margaree Mackintosh, MD  aspirin EC 81 MG tablet Take 1 tablet (81 mg total) by mouth daily. Swallow whole. 07/22/21   Orbie Pyo, MD  Budeson-Glycopyrrol-Formoterol (BREZTRI AEROSPHERE) 160-9-4.8 MCG/ACT AERO Inhale 2 puffs into the lungs 2 (two) times daily. 12/05/22   Glenford Bayley, NP  empagliflozin (JARDIANCE) 10 MG TABS tablet Take 1 tablet (10 mg total) by mouth daily before breakfast. 11/13/22   Orbie Pyo, MD  fluticasone (FLONASE) 50 MCG/ACT nasal spray Place 1 spray into both nostrils daily. 12/05/22   Glenford Bayley, NP  furosemide (LASIX) 20 MG tablet Take 1 tablet by mouth once daily 03/05/23   Margaree Mackintosh, MD  ibandronate (BONIVA) 150 MG tablet Take 1 tablet (150 mg total) by mouth every 30 (thirty) days. Take in the morning with a full glass of water, on an empty stomach, and do not take anything else by mouth or lie down for the next 30 min. 04/11/22   Margaree Mackintosh, MD  levothyroxine (SYNTHROID) 100 MCG tablet Take 1 tablet by mouth once daily 03/12/23   Margaree Mackintosh, MD  losartan (COZAAR) 100 MG tablet Take 1 tablet (100 mg total) by mouth at bedtime. 12/13/22   Orbie Pyo, MD  metoprolol succinate (TOPROL XL) 25 MG 24 hr tablet Take 0.5 tablets (12.5 mg total) by mouth at bedtime. 11/09/22   Sharlene Dory, PA-C  omeprazole (PRILOSEC) 20 MG capsule Take 1 capsule by mouth once daily 02/15/22   Margaree Mackintosh, MD  Spacer/Aero-Holding Chambers DEVI 1 each by Does not apply route in the morning and at bedtime. 10/10/21   Malena Peer D, RPH-CPP    Family History Family History  Problem Relation Age of Onset   Heart failure Mother    Heart failure Father     Hypertension Sister    Neuropathy Neg Hx     Social History Social History   Tobacco Use   Smoking status: Former    Current packs/day: 0.00    Average packs/day: 1 pack/day for 10.0 years (10.0 ttl pk-yrs)    Types: Cigarettes    Start date: 03/29/1981    Quit date: 03/30/1991    Years since quitting: 32.0   Smokeless tobacco: Never   Tobacco comments:    quit smoking  in Mar 1993  Vaping Use   Vaping status: Never Used  Substance Use Topics   Alcohol use: No    Alcohol/week: 0.0 standard drinks of alcohol   Drug use: No     Allergies   Doxycycline   Review of Systems Review of Systems   Physical Exam Triage Vital Signs ED Triage Vitals  Encounter Vitals Group     BP 04/09/23 0913 (!) 155/77     Systolic BP Percentile --      Diastolic BP Percentile --      Pulse Rate 04/09/23 0913 66     Resp 04/09/23 0913 18     Temp 04/09/23 0913 98 F (36.7 C)     Temp Source 04/09/23 0913 Oral     SpO2 04/09/23 0913 97 %     Weight --      Height --      Head Circumference --      Peak Flow --      Pain Score 04/09/23 0912 8     Pain Loc --      Pain Education --      Exclude from Growth Chart --    No data found.  Updated Vital Signs BP (!) 155/77 (BP Location: Left Arm)   Pulse 66   Temp 98 F (36.7 C) (Oral)   Resp 18   SpO2 97%   Visual Acuity Right Eye Distance:   Left Eye Distance:   Bilateral Distance:    Right Eye Near:   Left Eye Near:    Bilateral Near:     Physical Exam Vitals reviewed.  Constitutional:      General: She is awake.     Appearance: Normal appearance. She is well-developed and well-groomed.  Pulmonary:     Effort: Pulmonary effort is normal.  Musculoskeletal:     Cervical back: Normal range of motion.  Skin:    General: Skin is warm and dry.  Neurological:     General: No focal deficit present.     Mental Status: She is alert and oriented to person, place, and time.  Psychiatric:        Mood and Affect: Mood  normal.        Behavior: Behavior normal. Behavior is cooperative.        Thought Content: Thought content normal.        Judgment: Judgment normal.      Media Information   Document Information  Photos  Right forearm skin tear  04/09/2023 09:35  Attached To:  Hospital Encounter on 04/09/23  Source Information  Serayah Yazdani, Mirian Mo  [facility]  Document History     Media Information   Document Information  Photos  Right forearm skin tear  04/09/2023 09:34  Attached To:  Hospital Encounter on 04/09/23  Source Information  Brandin Dilday, Mirian Mo  [facility]  Document History     Media Information   Document Information  Photos  Right wrist skin tear  04/09/2023 09:34  Attached To:  Hospital Encounter on 04/09/23  Source Information  Tashonna Descoteaux, Mirian Mo  [facility]  Document History     Media Information   Document Information  Photos  Right hand skin tear  04/09/2023 09:34  Attached To:  Hospital Encounter on 04/09/23  Source Information  Evlyn Amason, Oswaldo Conroy, PA-C  [facility]  Document History     UC Treatments / Results  Labs (all labs ordered are  listed, but only abnormal results are displayed) Labs Reviewed - No data to display  EKG   Radiology No results found.  Procedures Procedures (including critical care time)  Medications Ordered in UC Medications  silver sulfADIAZINE (SILVADENE) 1 % cream (has no administration in time range)    Initial Impression / Assessment and Plan / UC Course  I have reviewed the triage vital signs and the nursing notes.  Pertinent labs & imaging results that were available during my care of the patient were reviewed by me and considered in my medical decision making (see chart for details).      Final Clinical Impressions(s) / UC Diagnoses   Final diagnoses:  Visit for wound check  Skin tear of forearm without complication, right, subsequent encounter     Patient presents today with concerns for her injuries to her right hand and forearm following a fall over the weekend.  She was previously seen at this urgent care on 04/07/2023 for the skin tears.  The provider at that time did not think that she was a good candidate for skin closure due to the fragility and thinness of her skin.  I reviewed this with her and recommend continued home measures.  Physical exam shows for skin tears present.  The injuries appear to be healing well at this time and did not show signs of swelling, purulent drainage, erythema.  There is significant amount of bruising to the area but she reports that this has not worsened since the injury.  She does have intact movement of her fingers but reports that this is painful due to the skin tear on her hand because flexing her fingers causes tension which pulls at open further.  She reports that she is having to keep her arm in a flexed position as further movement seems to pull on the tears further.  Will give her a sling today to assist with further injury.  We will redress her wounds and apply Silvadene cream for ABX coverage.  Will send in prescription for Silvadene and recommend application twice per day for at least 7 days to help with infection prevention.  Recommend she continues with twice daily bandage changes and reviewed soaking the bandages to help with removal.  ED and return precautions reviewed and provided in after visit summary.  Follow-up as needed for progressing or persistent symptoms     Discharge Instructions      The skin tears on your right hand and forearm appear to be healing rather well.  I recommend that you continue your home measures with regards to cleansing and bandage changes.  Make sure that you are using a gentle cleanser and warm water to irrigate the areas and prevent infection.  I do not recommend scrubbing or rubbing at the area as this could cause further injury to your skin.  Gently soaking  the area in a warm water and soap make sure should be sufficient to cleanse it.  I have sent in a prescription for a cream called Silvadene for you to apply to your wounds twice per day during bandage changes.  This should help prevent infection as well as improve healing. Please make sure that you are using nonstick bandages and try soaking the bandages before you remove them to help them come off a bit easier and not pull at the skin. If at any point you start to have severe pain, redness, swelling, drainage that looks like pus at any of your injuries please return here  or go to the emergency room for further evaluation management.     ED Prescriptions     Medication Sig Dispense Auth. Provider   silver sulfADIAZINE (SILVADENE) 1 % cream Apply 1 Application topically 2 (two) times daily for 7 days. 50 g Eunice Oldaker E, PA-C      PDMP not reviewed this encounter.   Providence Crosby, PA-C 04/09/23 1610

## 2023-04-09 NOTE — Discharge Instructions (Signed)
 The skin tears on your right hand and forearm appear to be healing rather well.  I recommend that you continue your home measures with regards to cleansing and bandage changes.  Make sure that you are using a gentle cleanser and warm water to irrigate the areas and prevent infection.  I do not recommend scrubbing or rubbing at the area as this could cause further injury to your skin.  Gently soaking the area in a warm water and soap make sure should be sufficient to cleanse it.  I have sent in a prescription for a cream called Silvadene for you to apply to your wounds twice per day during bandage changes.  This should help prevent infection as well as improve healing. Please make sure that you are using nonstick bandages and try soaking the bandages before you remove them to help them come off a bit easier and not pull at the skin. If at any point you start to have severe pain, redness, swelling, drainage that looks like pus at any of your injuries please return here or go to the emergency room for further evaluation management.

## 2023-04-16 ENCOUNTER — Ambulatory Visit: Admitting: Primary Care

## 2023-04-16 ENCOUNTER — Encounter: Payer: Self-pay | Admitting: Primary Care

## 2023-04-16 VITALS — BP 124/70 | HR 65 | Temp 98.1°F | Ht 63.0 in | Wt 119.8 lb

## 2023-04-16 DIAGNOSIS — J4540 Moderate persistent asthma, uncomplicated: Secondary | ICD-10-CM | POA: Diagnosis not present

## 2023-04-16 DIAGNOSIS — J302 Other seasonal allergic rhinitis: Secondary | ICD-10-CM

## 2023-04-16 MED ORDER — FLUTICASONE PROPIONATE 50 MCG/ACT NA SUSP: 1.0000 | Freq: Every day | NASAL | 2 refills | Status: DC

## 2023-04-16 NOTE — Patient Instructions (Addendum)
-  ASTHMA: Asthma is a condition where your airways narrow and swell, making it difficult to breathe. Your asthma is well-managed with the Breztri inhaler, and you use a nebulizer about once a month for faster relief. Continue using the Breztri inhaler as prescribed, and we have refilled your nebulizer prescription.  -SEASONAL ALLERGIES: Seasonal allergies occur when your immune system reacts to allergens like pollen, causing symptoms such as postnasal drip and nasal congestion. These symptoms are contributing to your cough. We have prescribed a nasal spray with two refills for you to use as needed to help relieve these symptoms.  -GASTROESOPHAGEAL REFLUX DISEASE (GERD): GERD is a condition where stomach acid frequently flows back into the tube connecting your mouth and stomach, causing discomfort. Your GERD is controlled with omeprazole, which you take once daily. Continue taking omeprazole as prescribed.  -HYPOTHYROIDISM: Hypothyroidism is a condition where your thyroid gland doesn't produce enough thyroid hormone. Your condition is managed with levothyroxine, and your thyroid levels are normal. Continue taking levothyroxine as currently prescribed.  INSTRUCTIONS: Please continue with your current medication regimen and follow the new prescriptions as discussed. If you experience any new or worsening symptoms, schedule a follow-up appointment. Refill your nebulizer prescription and use the nasal spray as needed for your allergies.  Follow-up: 1 year with Beth NP or sooner if needed

## 2023-04-16 NOTE — Progress Notes (Signed)
 @Patient  ID: Terri Bridges, female    DOB: 10-Nov-1934, 89 y.o.   MRN: [id number]  No chief complaint on file.   Referring provider: Margaree Mackintosh, MD  HPI: 89 year old female, former smoker.  Past medical history significant for heart failure with reduced ejection fraction, hypertension, COPD, recurrent sinusitis, hypothyroidism, osteoporosis, kidney disease.  Former patient of Dr. Tonia Brooms.  Previous [facility] pulmonary encounter:   OV 05/09/2021: Here today for follow-up.  Her shortness of breath is much improved.  She feels like she can walk her dog out to the mailbox without getting short of breath.  She has been using her Breztri inhaler.  She was unable to afford Trelegy.  [place] for [place].  Not really sure what the status of this application is or whether or not she has been approved.  We will need to follow-up with [place] today.  OV 11/10/2021: Follow-up regarding chronic symptoms.  She is doing really well on Breztri.  She has a longstanding prior history of smoking.  She is PFTs with a mixed obstructive restrictive defect.  Overall she is feeling better.  She still feels like she is a little weak at times.  But she attributes these changes to her getting older.  Mixed obstructive and restrictive ventilatory defect Moderate persistent asthma without complication Kyphoscoliosis Seasonal allergies Hiatal hernia   Discussion: This is an 89 year old female, likely moderate persistent asthma recurrent exacerbations as PFTs with a mixed obstructive restrictive defect.  Has been able to get medication sent to her house from [facility].  Currently using Breztri.   Plan: Continue triple therapy inhaler Continue PPI Continue albuterol as needed We will follow up with [place] as needed or in 1 year.    12/05/2022 Patient presents today for 1 year follow-up.  Followed by our office for history of moderate persistent asthma, seasonal allergies and hiatal hernia.   Pulmonary function testing showed mixed obstructive and restrictive defect.  Currently maintained on [employer].  Plan is to continue triple therapy inhaler, albuterol as needed and PPI. CXR at [facility] in October 2024 showed no acute findings, large retrocardiac haital hernia, elevated right hemidiaphragm.  Discussed the use of AI scribe software for clinical note transcription with the patient, who gave verbal consent to proceed.  History of Present Illness   The patient, a known case of chronic cough, presented for a one-year follow-up. Despite being 89 years old, the patient reported feeling well overall, with her respiratory symptoms being relatively well controlled. She reports using Breztri inhaler, two puffs morning and evening, and a nebulizer for symptom management. The patient expressed a preference for the nebulizer, stating it works faster and is easier to use. However, she also acknowledged the convenience of the [facility] inhaler. The patient reported that the use of an aerochamber with the inhaler has improved her comfort during administration. The patient expressed a desire to continue her current regimen of Breztri inhaler and albuterol nebulizer as needed. She also reported a recent increase in hoarseness, which she attributed to the weather. The patient also reported taking omeprazole as needed for reflux and Jardiance for her diabetes.    Moderate persistent asthma  - Stable on Breztri Aerosphere and Albuterol nebulizer as needed. Patient prefers nebulizer for faster relief but finds Breztri more convenient. CXR in October 2024 showed showed no acute findings, large retrocardiac haital hernia, elevated right hemidiaphragm  04/16/2023- Interim hx  Patient presents today for routine follow-up asthma/chronic cough.  Discussed the use of AI scribe software for clinical note transcription with the patient, who gave verbal consent to proceed.  History of Present Illness   Terri Bridges is an 89 year old female with asthma and seasonal allergies who presents for follow-up.  She manages her asthma with the [facility] inhaler, which she finds effective. She does not use an albuterol inhaler but utilizes a nebulizer approximately once a month for faster relief. A chest x-ray conducted last fall showed no acute findings related to asthma.  She experiences symptoms suggestive of seasonal allergies, including occasional postnasal drip and nasal congestion, which she associates with her cough. She does not currently use nasal sprays or cough syrup.  Her current medication regimen includes Lasix, Jardiance in the morning, and levothyroxine, losartan, metoprolol, omeprazole, and Xanax at night. She takes omeprazole once a day for reflux. Her thyroid levels are normal, with no changes to her medication routine. A chest x-ray last fall revealed a hiatal hernia, which is being managed with omeprazole for reflux symptoms.         Allergies  Allergen Reactions   Doxycycline Rash    Immunization History  Administered Date(s) Administered   Fluad Quad(high Dose 65+) 10/24/2018, 11/19/2021   Fluad Trivalent(High Dose 65+) 12/01/2022   Influenza Split 10/20/2011   Influenza, High Dose Seasonal PF 09/12/2013, 10/27/2015, 10/13/2016, 10/27/2017, 10/24/2018   Influenza,inj,Quad PF,6+ Mos 09/12/2013, 10/13/2016, 09/18/2019, 09/24/2020   Influenza-Unspecified 10/20/2011, 09/12/2013   Moderna Sars-Covid-2 Vaccination 02/22/2019, 03/22/2019, 11/24/2019   PNEUMOCOCCAL CONJUGATE-20 03/30/2022   Pfizer(Comirnaty)Fall Seasonal Vaccine 12 years and older 12/08/2021   Pneumococcal Conjugate-13 01/21/2015   Pneumococcal Polysaccharide-23 05/11/2003   Tdap 05/11/2003, 03/13/2013, 09/08/2019, 04/07/2023    Past Medical History:  Diagnosis Date   Allergic rhinitis    Allergy    uses Flonase daily as needed   Anxiety    takes Xanax daily as needed   Arthritis    "back"  (04/21/2013)   Chronic lower back pain    Constipation    takes an OTC stool softener   Depression    takes Zoloft daily   Dry eyes    uses eye drops   Exertional shortness of breath    GERD (gastroesophageal reflux disease)    takes Omeprazole daily and Protonix daily as needed   H/O hiatal hernia    Headache(784.0)    sinus   Heart murmur    years ago   History of bronchitis    last time many yrs ago   Hypertension    takes Losartan and Cardizem daily   Hypothyroidism    takes Synthroid daily   Joint pain    Joint swelling    Nocturia    Osteoporosis    Pressure in chest 08/23/13   fell and started having chest pressure, will have ECHO and stress test 09/04/13 before surgery   Ringing in ears    sees Dr.Byers for this   Scoliosis    Urinary frequency    Urinary incontinence    Urinary urgency     Tobacco History: Social History   Tobacco Use  Smoking Status Former   Current packs/day: 0.00   Average packs/day: 1 pack/day for 10.0 years (10.0 ttl pk-yrs)   Types: Cigarettes   Start date: 03/29/1981   Quit date: 03/30/1991   Years since quitting: 32.0  Smokeless Tobacco Never  Tobacco Comments   quit smoking in Mar 1993   Counseling given: Not Answered Tobacco comments: quit smoking in  Mar 1993   Outpatient Medications Prior to Visit  Medication Sig Dispense Refill   ALPRAZolam (XANAX) 0.5 MG tablet TAKE 1 TABLET BY MOUTH AT BEDTIME 30 tablet 3   aspirin EC 81 MG tablet Take 1 tablet (81 mg total) by mouth daily. Swallow whole. 90 tablet 3   Budeson-Glycopyrrol-Formoterol (BREZTRI AEROSPHERE) 160-9-4.8 MCG/ACT AERO Inhale 2 puffs into the lungs 2 (two) times daily. 10.7 g 11   empagliflozin (JARDIANCE) 10 MG TABS tablet Take 1 tablet (10 mg total) by mouth daily before breakfast. 90 tablet 3   fluticasone (FLONASE) 50 MCG/ACT nasal spray Place 1 spray into both nostrils daily. 16 g 0   furosemide (LASIX) 20 MG tablet Take 1 tablet by mouth once daily 90 tablet  1   ibandronate (BONIVA) 150 MG tablet Take 1 tablet (150 mg total) by mouth every 30 (thirty) days. Take in the morning with a full glass of water, on an empty stomach, and do not take anything else by mouth or lie down for the next 30 min. 1 tablet 11   levothyroxine (SYNTHROID) 100 MCG tablet Take 1 tablet by mouth once daily 90 tablet 0   losartan (COZAAR) 100 MG tablet Take 1 tablet (100 mg total) by mouth at bedtime. 90 tablet 3   metoprolol succinate (TOPROL XL) 25 MG 24 hr tablet Take 0.5 tablets (12.5 mg total) by mouth at bedtime. 45 tablet 0   omeprazole (PRILOSEC) 20 MG capsule Take 1 capsule by mouth once daily 90 capsule 0   silver sulfADIAZINE (SILVADENE) 1 % cream Apply 1 Application topically 2 (two) times daily for 7 days. 50 g 0   Spacer/Aero-Holding Chambers DEVI 1 each by Does not apply route in the morning and at bedtime. 1 each 0   No facility-administered medications prior to visit.      Review of Systems  Review of Systems  Constitutional: Negative.   HENT:  Positive for postnasal drip.   Respiratory:  Negative for cough, shortness of breath and wheezing.   Cardiovascular: Negative.     Physical Exam  There were no vitals taken for this visit. Physical Exam Constitutional:      Appearance: Normal appearance.  HENT:     Head: Normocephalic and atraumatic.  Cardiovascular:     Rate and Rhythm: Normal rate and regular rhythm.  Pulmonary:     Effort: Pulmonary effort is normal.     Breath sounds: Normal breath sounds.  Musculoskeletal:        General: Normal range of motion.  Skin:    General: Skin is warm and dry.  Neurological:     General: No focal deficit present.     Mental Status: She is alert and oriented to person, place, and time. Mental status is at baseline.     Deep Tendon Reflexes: Abnormal reflex: .AP.  Psychiatric:        Mood and Affect: Mood normal.        Behavior: Behavior normal.        Thought Content: Thought content normal.         Judgment: Judgment normal.      Lab Results:  CBC    Component Value Date/Time   WBC 6.2 03/29/2023 0955   RBC 4.74 03/29/2023 0955   HGB 13.0 03/29/2023 0955   HCT 40.9 03/29/2023 0955   PLT 160 03/29/2023 0955   MCV 86.3 03/29/2023 0955   MCH 27.4 03/29/2023 0955   MCHC 31.8 (L) 03/29/2023 0955  RDW 12.8 03/29/2023 0955   LYMPHSABS 1,786 03/21/2022 0910   MONOABS 0.5 02/26/2018 0949   EOSABS 99 03/29/2023 0955   BASOSABS 37 03/29/2023 0955    BMET    Component Value Date/Time   NA 143 03/29/2023 0955   NA 143 11/03/2021 1207   K 4.4 03/29/2023 0955   CL 106 03/29/2023 0955   CO2 28 03/29/2023 0955   GLUCOSE 79 03/29/2023 0955   BUN 28 (H) 03/29/2023 0955   BUN 24 11/03/2021 1207   CREATININE 1.54 (H) 03/29/2023 0955   CALCIUM 9.2 03/29/2023 0955   GFRNONAA 34 (L) 03/18/2020 1209   GFRAA 40 (L) 03/18/2020 1209    BNP    Component Value Date/Time   BNP 853 (H) 07/15/2021 1224    ProBNP No results found for: "PROBNP"  Imaging: No results found.   Assessment & Plan:   1. Moderate persistent asthma without complication (Primary)  2. Seasonal allergies   Assessment and Plan    Asthma Asthma is well-managed with Breztri inhaler. No acute exacerbations. Uses nebulizer monthly for relief. - Continue Breztri inhaler as prescribed. - Refill nebulizer prescription.  Seasonal Allergies Seasonal allergies cause postnasal drip and congestion, leading to coughing. She is open to nasal sprays for relief. - Prescribe Fluticasone nasal spray with two refills for use as needed.  Gastroesophageal Reflux Disease (GERD) GERD is controlled with omeprazole once daily. She is unsure about the regimen but confirms it is routine. - Continue omeprazole once daily.  Hypothyroidism Hypothyroidism is managed with levothyroxine. Thyroid levels are normal.  - Continue levothyroxine as currently prescribed.         Glenford Bayley, NP 04/16/2023

## 2023-04-20 DIAGNOSIS — I420 Dilated cardiomyopathy: Secondary | ICD-10-CM | POA: Diagnosis not present

## 2023-04-20 DIAGNOSIS — N281 Cyst of kidney, acquired: Secondary | ICD-10-CM | POA: Diagnosis not present

## 2023-04-20 DIAGNOSIS — N184 Chronic kidney disease, stage 4 (severe): Secondary | ICD-10-CM | POA: Diagnosis not present

## 2023-04-20 DIAGNOSIS — N2581 Secondary hyperparathyroidism of renal origin: Secondary | ICD-10-CM | POA: Diagnosis not present

## 2023-04-20 DIAGNOSIS — I129 Hypertensive chronic kidney disease with stage 1 through stage 4 chronic kidney disease, or unspecified chronic kidney disease: Secondary | ICD-10-CM | POA: Diagnosis not present

## 2023-04-22 ENCOUNTER — Other Ambulatory Visit: Payer: Self-pay | Admitting: Physician Assistant

## 2023-05-04 NOTE — Progress Notes (Signed)
 Cardiology Office Note:   Date:  05/11/2023  ID:  Terri Bridges [place], DOB 1934/02/15, MRN [id number] PCP:  Terri Evener, MD  [facility] Providers Cardiologist:  [name], MD Referring MD: Terri Evener, MD  Chief Complaint/Reason for Referral: Follow-up for cardiomyopathy ASSESSMENT:    1. HFrEF (heart failure with reduced ejection fraction) (HCC)   2. Bifascicular block   3. Hyperlipidemia LDL goal <70   4. Aortic atherosclerosis (HCC)   5. Stage 3b chronic kidney disease (HCC)   6. Nonrheumatic mitral valve regurgitation   7. Primary hypertension     PLAN:   In order of problems listed above: Cardiomyopathy: Patient defers coronary angiography or ICD therapies.  Continue medical management with Toprol  12.5 mg daily, losartan  100 mg daily, Lasix  20 mg daily, and Jardiance  10 mg daily. Bifascicular block:  Monitor for now. Hyperlipidemia: Patient defers statins. Aortic atherosclerosis: Continue aspirin  81 mg daily; patient defers statins. CKD stage IIIb: Continue losartan  100 mg daily and Jardiance  10 mg daily. Mitral regurgitation: Mild to moderate; patient does not invasive procedures.  Continue aggressive medical management of cardiomyopathy with Jardiance  10 mg daily, Lasix  20 mg daily, losartan  100 mg daily, and Toprol  12.5 mg daily. Hypertension: Continue losartan  100 mg, Toprol  12.5 mg.             Dispo:  Return in about 6 months (around 11/11/2023).      Medication Adjustments/Labs and Tests Ordered: Current medicines are reviewed at length with the patient today.  Concerns regarding medicines are outlined above.  The following changes have been made:  no change   Labs/tests ordered: No orders of the defined types were placed in this encounter.   Medication Changes: No orders of the defined types were placed in this encounter.   Current medicines are reviewed at length with the patient today.  The patient does not have concerns regarding  medicines.  I spent 33 minutes reviewing all clinical data during and prior to this visit including all relevant imaging studies, laboratories, clinical information from other health systems and prior notes from both Cardiology and other specialties, interviewing the patient, conducting a complete physical examination, and coordinating care in order to formulate a comprehensive and personalized evaluation and treatment plan.   History of Present Illness:      FOCUSED PROBLEM LIST:   Bifascicular block 1 AVB plus LBBB Hyperlipidemia Patient defers statins Aortic atherosclerosis CT 2019 Hypertension Cardiomyopathy EF 30 to 35% TTE February 2024 Patient defers invasive procedures or ICD CKD stage IIIb MR Mild to moderate TTE February 2024  July 2023: Patient seen for initial consultation regarding dyspnea and new bifascicular block.  She is noted to have a relatively low blood pressure and denied any shortness of breath.  Her biggest complaint was fatigue.  For this reason her Lasix  was held for 3 days.  An echocardiogram was obtained which demonstrated a new cardiomyopathy with an ejection fraction of 30 to 35%   August 2023: The patient continues to do well.  She is able to do most of her activities of daily living without any significant shortness of breath, presyncope, syncope, or chest pain.  She has had some peripheral edema but this seems to be related to cellulitis rather than heart failure symptoms.  She does sleep on 2 pillows and this has been a chronic practice for her stemming from chronic back pain issues.  She fortunately has not required any emergency room visits or hospitalizations for cardiovascular issues.  Plan: Start Jardiance  10 mg daily, continue losartan , stop amlodipine , and start Toprol -XL 25 mg daily; obtain coronary CTA to characterize cardiomyopathy given patient's reluctance for invasive procedures.   October 2023: The patient is here for expedited follow-up.  She  has had issues with her medical therapy given the multiple new medication changes that were instituted at her last visit.  In the interim the patient had her Toprol  decreased to half a pill because of some lightheadedness.  She is taking her Toprol , losartan , and spironolactone  in the morning.  She also takes her Lasix  in the morning as well as her Jardiance .  She feels like her breathing has been relatively stable.  She definitely cannot do as much as she could do about a year ago.  She denies any chest pain, syncope, significant peripheral edema, or paroxysmal nocturnal dyspnea.  Plan: Change Toprol -XL to 25 mg at bedtime, change losartan  to 100 mg at bedtime and spironolactone  to 25 mg at bedtime.  Obtain coronary CTA.   February 2024: In the interim a coronary CTA was not performed.  She was seen by her pulmonologist in November and was doing well.  She had an echocardiogram done today which has not yet been read but looks to be improved with an ejection fraction around 40%.  The patient is doing very well.  She denies any significant shortness of breath, paroxysmal nocturnal dyspnea, severe peripheral edema, presyncope, or syncope.  She is drinking protein shakes and feels much better as well.  She is able to walk her dog without any limitations.  She has not required any emergency room visits or hospitalizations.  Plan: Increase Jardiance  to 25 mg daily.  April 2025:  Patient consents to use of AI scribe. The patient did not increase her Jardiance  to 25 mg.  She was seen by general cardiology in October and was doing well.  Her heart rate was lower and so her Toprol  was decreased to 12.5 mg.  She experiences lightheadedness, particularly when transitioning from sitting to standing quickly, which she attributes to her blood pressure medication. This has led to a recent fall about a month ago when she stood up quickly after waking from a nap, resulting in skin tears due to her thin skin. She managed the  injury by wrapping it up and sought care at urgent care the following morning, where she applied a silver  cream and advised her on wound care. She continues to clean the wound and apply the cream, which has aided in healing.  She notes a history of hernias, which are tender, but does not report any new or worsening symptoms related to this condition.        Current Medications: Current Meds  Medication Sig   ALPRAZolam  (XANAX ) 0.5 MG tablet TAKE 1 TABLET BY MOUTH AT BEDTIME   aspirin  EC 81 MG tablet Take 1 tablet (81 mg total) by mouth daily. Swallow whole.   Budeson-Glycopyrrol-Formoterol  (BREZTRI  AEROSPHERE) 160-9-4.8 MCG/ACT AERO Inhale 2 puffs into the lungs 2 (two) times daily.   empagliflozin  (JARDIANCE ) 10 MG TABS tablet Take 1 tablet (10 mg total) by mouth daily before breakfast.   furosemide  (LASIX ) 20 MG tablet Take 1 tablet by mouth once daily   levothyroxine  (SYNTHROID ) 100 MCG tablet Take 1 tablet by mouth once daily   losartan  (COZAAR ) 100 MG tablet Take 1 tablet (100 mg total) by mouth at bedtime.   metoprolol  succinate (TOPROL -XL) 25 MG 24 hr tablet TAKE 1/2 (ONE-HALF) TABLET BY MOUTH AT  BEDTIME   omeprazole  (PRILOSEC) 20 MG capsule Take 1 capsule by mouth once daily   Spacer/Aero-Holding Chambers DEVI 1 each by Does not apply route in the morning and at bedtime.     Review of Systems:   Please see the history of present illness.    All other systems reviewed and are negative.     EKGs/Labs/Other Test Reviewed:   EKG: 2024 sinus bradycardia with first-degree AV block and left bundle branch block  EKG Interpretation Date/Time:    Ventricular Rate:    PR Interval:    QRS Duration:    QT Interval:    QTC Calculation:   R Axis:      Text Interpretation:           Risk Assessment/Calculations:          Physical Exam:   VS:  BP 130/80   Pulse 74   Ht 5\' 3"  (1.6 m)   Wt 119 lb 6.4 oz (54.2 kg)   SpO2 98%   BMI 21.15 kg/m        Wt Readings from Last  3 Encounters:  05/11/23 119 lb 6.4 oz (54.2 kg)  04/16/23 119 lb 12.8 oz (54.3 kg)  04/03/23 118 lb 12.8 oz (53.9 kg)      GENERAL:  No apparent distress, AOx3 HEENT:  No carotid bruits, +2 carotid impulses, no scleral icterus CAR: RRR no murmurs, gallops, rubs, or thrills RES:  Clear to auscultation bilaterally ABD:  Soft, nontender, nondistended, positive bowel sounds x 4 VASC:  +2 radial pulses, +2 carotid pulses NEURO:  CN 2-12 grossly intact; motor and sensory grossly intact PSYCH:  No active depression or anxiety EXT:  No edema, ecchymosis, or cyanosis  Signed, Ece Cumberland K Leitha Hyppolite, MD  05/11/2023 9:38 AM    [facility] [address] [place], [place], [place]  [address] Phone: [phone]; Fax: [phone]   Note:  This document was prepared using Dragon voice recognition software and may include unintentional dictation errors.

## 2023-05-09 ENCOUNTER — Other Ambulatory Visit: Payer: Self-pay | Admitting: Internal Medicine

## 2023-05-11 ENCOUNTER — Encounter: Payer: Self-pay | Admitting: Internal Medicine

## 2023-05-11 ENCOUNTER — Ambulatory Visit: Payer: Medicare Other | Attending: Internal Medicine | Admitting: Internal Medicine

## 2023-05-11 VITALS — BP 130/80 | HR 74 | Ht 63.0 in | Wt 119.4 lb

## 2023-05-11 DIAGNOSIS — I10 Essential (primary) hypertension: Secondary | ICD-10-CM | POA: Diagnosis not present

## 2023-05-11 DIAGNOSIS — I452 Bifascicular block: Secondary | ICD-10-CM | POA: Diagnosis not present

## 2023-05-11 DIAGNOSIS — I700 Atherosclerosis of aorta: Secondary | ICD-10-CM

## 2023-05-11 DIAGNOSIS — E785 Hyperlipidemia, unspecified: Secondary | ICD-10-CM | POA: Diagnosis not present

## 2023-05-11 DIAGNOSIS — I340 Nonrheumatic mitral (valve) insufficiency: Secondary | ICD-10-CM

## 2023-05-11 DIAGNOSIS — I5020 Unspecified systolic (congestive) heart failure: Secondary | ICD-10-CM

## 2023-05-11 DIAGNOSIS — N1832 Chronic kidney disease, stage 3b: Secondary | ICD-10-CM

## 2023-05-11 NOTE — Patient Instructions (Signed)
 Medication Instructions:  No changes *If you need a refill on your cardiac medications before your next appointment, please call your pharmacy*  Lab Work: none If you have labs (blood work) drawn today and your tests are completely normal, you will receive your results only by: MyChart Message (if you have MyChart) OR A paper copy in the mail If you have any lab test that is abnormal or we need to change your treatment, we will call you to review the results.  Testing/Procedures: none  Follow-Up: At [facility], you and your health needs are our priority.  As part of our continuing mission to provide you with exceptional heart care, our providers are all part of one team.  This team includes your primary Cardiologist (physician) and Advanced Practice Providers or APPs (Physician Assistants and Nurse Practitioners) who all work together to provide you with the care you need, when you need it.  Your next appointment:   6 month(s)  Provider:   Arun K Thukkani, MD

## 2023-06-05 ENCOUNTER — Other Ambulatory Visit: Payer: Self-pay | Admitting: Internal Medicine

## 2023-06-07 ENCOUNTER — Telehealth: Payer: Self-pay

## 2023-06-07 MED ORDER — BREZTRI AEROSPHERE 160-9-4.8 MCG/ACT IN AERO: 2.0000 | INHALATION_SPRAY | Freq: Two times a day (BID) | RESPIRATORY_TRACT | 3 refills | Status: DC

## 2023-06-07 NOTE — Telephone Encounter (Signed)
 [facility] faxed over papers that the pt needed an updated RX sent in for Breztri  as they did not have a active rx for this. Pt was last seen by Irby Mannan, NP on 04-16-23 and it was stated on the lov that the pt was doing well on Breztri  and to continue to take it. The last rx for Breztri  was sent in on 12-05-22. The rx would need to go to [facility]. I will send a refill for Breztri  through [facility] and fax it. NFN

## 2023-06-15 ENCOUNTER — Other Ambulatory Visit: Payer: Self-pay | Admitting: Internal Medicine

## 2023-07-02 ENCOUNTER — Other Ambulatory Visit: Payer: Self-pay | Admitting: Internal Medicine

## 2023-07-02 NOTE — Telephone Encounter (Signed)
 Medication: Xanax  Directions: TAKE 1 TABLET BY MOUTH AT BEDTIME  Last given: 02/07/2023 Number refills: 3 Last o/v: 04/03/2023 Follow up: Return in about 6 months (around 10/04/2023).  Labs:  04/01/2023

## 2023-07-06 ENCOUNTER — Telehealth: Payer: Self-pay

## 2023-07-06 ENCOUNTER — Other Ambulatory Visit: Payer: Self-pay

## 2023-07-06 DIAGNOSIS — M816 Localized osteoporosis [Lequesne]: Secondary | ICD-10-CM

## 2023-07-06 MED ORDER — IBANDRONATE SODIUM 150 MG PO TABS
150.0000 mg | ORAL_TABLET | ORAL | 3 refills | Status: DC
Start: 2023-07-06 — End: 2023-08-09

## 2023-07-06 NOTE — Telephone Encounter (Signed)
 Completed.

## 2023-07-06 NOTE — Telephone Encounter (Signed)
 Medication: Bearl    Rx request via fax, paper left on providers desk.

## 2023-07-23 ENCOUNTER — Other Ambulatory Visit: Payer: Self-pay | Admitting: Physician Assistant

## 2023-08-09 ENCOUNTER — Ambulatory Visit (INDEPENDENT_AMBULATORY_CARE_PROVIDER_SITE_OTHER): Admitting: Internal Medicine

## 2023-08-09 ENCOUNTER — Encounter: Payer: Self-pay | Admitting: Internal Medicine

## 2023-08-09 VITALS — BP 110/80 | HR 80 | Temp 98.7°F | Ht 63.0 in | Wt 119.0 lb

## 2023-08-09 DIAGNOSIS — M810 Age-related osteoporosis without current pathological fracture: Secondary | ICD-10-CM

## 2023-08-09 DIAGNOSIS — F409 Phobic anxiety disorder, unspecified: Secondary | ICD-10-CM

## 2023-08-09 DIAGNOSIS — I10 Essential (primary) hypertension: Secondary | ICD-10-CM

## 2023-08-09 DIAGNOSIS — F439 Reaction to severe stress, unspecified: Secondary | ICD-10-CM

## 2023-08-09 DIAGNOSIS — E039 Hypothyroidism, unspecified: Secondary | ICD-10-CM | POA: Diagnosis not present

## 2023-08-09 DIAGNOSIS — Z8709 Personal history of other diseases of the respiratory system: Secondary | ICD-10-CM | POA: Diagnosis not present

## 2023-08-09 DIAGNOSIS — R7302 Impaired glucose tolerance (oral): Secondary | ICD-10-CM | POA: Diagnosis not present

## 2023-08-09 DIAGNOSIS — F419 Anxiety disorder, unspecified: Secondary | ICD-10-CM | POA: Diagnosis not present

## 2023-08-09 DIAGNOSIS — F5105 Insomnia due to other mental disorder: Secondary | ICD-10-CM

## 2023-08-09 DIAGNOSIS — F32A Depression, unspecified: Secondary | ICD-10-CM

## 2023-08-09 MED ORDER — IBANDRONATE SODIUM 150 MG PO TABS: 150.0000 mg | ORAL_TABLET | ORAL | 3 refills | Status: DC

## 2023-08-09 MED ORDER — IBANDRONATE SODIUM 150 MG PO TABS: 150.0000 mg | ORAL_TABLET | ORAL | 3 refills | Status: AC

## 2023-08-09 NOTE — Progress Notes (Incomplete)
 Patient Care Team: Perri Ronal PARAS, MD as PCP - General (Internal Medicine) Thukkani, Arun K, MD as PCP - Cardiology (Cardiology)  Visit Date: 08/09/23  Subjective:   Chief Complaint  Patient presents with  . Fatigue   Patient PI:Terri Bridges, Terri Bridges DOB:1934-07-04,89 y.o. FMW:[id number]   89 y.o.Female presents today for acute sick visit with Fatigue and Anxiety. Patient has a past medical history of Anxiety treated with Xanax  0.5 mg, and quarters a 10 mg Melatonin to sleep. Says that she believes she may be depressed because for the past year her daughter, Luke, has been living with her after husband died. This reportedly has been causing significant stress for her due to the quality of the their relationship and her daughter does not contribute much to the household. Says that she has told her daughter that she does not want to live with her, she would like to live alone again, but her daughter has ignored this. She admits that she hasn't been sleeping well, about 5 hours a night: she takes her Melatonin to relax, and then around 1 AM goes down to 3-4 AM.   Discussed Boniva  - Bone Density 2019 T-score -3, Osteoporosis treated with Boniva  150 mg monthly.  Past Medical History:  Diagnosis Date  . Allergic rhinitis   . Allergy     uses Flonase  daily as needed  . Anxiety    takes Xanax  daily as needed  . Arthritis    back (04/21/2013)  . Chronic lower back pain   . Constipation    takes an OTC stool softener  . Depression    takes Zoloft  daily  . Dry eyes    uses eye drops  . Exertional shortness of breath   . GERD (gastroesophageal reflux disease)    takes Omeprazole  daily and Protonix  daily as needed  . H/O hiatal hernia   . Headache(784.0)    sinus  . Heart murmur    years ago  . History of bronchitis    last time many yrs ago  . Hypertension    takes Losartan  and Cardizem  daily  . Hypothyroidism    takes Synthroid  daily  . Joint pain   . Joint swelling    . Nocturia   . Osteoporosis   . Pressure in chest 08/23/13   fell and started having chest pressure, will have ECHO and stress test 09/04/13 before surgery  . Ringing in ears    sees Dr.Byers for this  . Scoliosis   . Urinary frequency   . Urinary incontinence   . Urinary urgency     Allergies  Allergen Reactions  . Doxycycline  Rash    Family History  Problem Relation Age of Onset  . Heart failure Mother   . Heart failure Father   . Hypertension Sister   . Neuropathy Neg Hx    Social History   Social History Narrative   Originally from [place].    Previously worked in Production designer, theatre/television/film for [employer].    Has 2 dogs currently.    Remote exposure to parrots in her current home.   No known mold exposure.    Remote travel to [place] & [place].   Has 1 indoor plant.    Caffeine use: No soda   2 cups coffee every morning.   Divorced and resides alone. 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  Prior to that she worked at [employer].  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 She gets her lot rent for free in exchange for doing this work and she feels safe there. She does not smoke - quit 03/1991 - and does not consume alcohol. In December 2014 her son suffered an arrest in the backyard and subsequently expired presumably of an MI -- he had a prior history of drug abuse. Her granddaughter, who has a leg deformity and has had multiple surgeries at [facility] in [place] , has moved to the [place] area with her boyfriend and now has a baby.   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         Review of Systems  Constitutional:  Negative for malaise/fatigue.  Cardiovascular:  Positive for palpitations.  Psychiatric/Behavioral:  The patient is nervous/anxious.        (+) Stress  All other systems reviewed and are negative.     Objective:  Vitals: BP 110/80   Pulse 80   Temp 98.7 F (37.1 C)   Ht 5' 3 (1.6 m)   Wt 119 lb (54 kg)   SpO2 96%   BMI 21.08 kg/m   Physical Exam Vitals and nursing note reviewed.  Constitutional:      General: She is not in acute distress.    Appearance: Normal appearance. She is not toxic-appearing.  HENT:     Head: Normocephalic and atraumatic.  Pulmonary:     Effort: Pulmonary effort is normal.  Skin:    General: Skin is warm and dry.     Findings: Ecchymosis present.  Neurological:     Mental Status: She is alert and oriented to person, place, and time. Mental status is at baseline.  Psychiatric:        Mood and Affect: Mood normal.        Behavior: Behavior normal.        Thought Content: Thought content normal.        Judgment: Judgment normal.     Results:  Studies Obtained And Personally Reviewed By Me: Labs:     Component Value Date/Time   NA 143 03/29/2023 0955   NA 143 11/03/2021 1207   K 4.4 03/29/2023 0955   CL 106 03/29/2023 0955   CO2 28 03/29/2023 0955   GLUCOSE 79 03/29/2023 0955   BUN 28 (H) 03/29/2023 0955   BUN 24 11/03/2021 1207   CREATININE 1.54 (H) 03/29/2023 0955   CALCIUM  9.2 03/29/2023 0955   PROT 6.3 03/29/2023 0955   ALBUMIN 3.7 01/20/2016 1027   AST 17 03/29/2023 0955   ALT 10 03/29/2023 0955   ALKPHOS 67 01/20/2016 1027   BILITOT 0.6 03/29/2023 0955   GFRNONAA 34 (L) 03/18/2020 1209   GFRAA 40 (L) 03/18/2020 1209    Lab Results  Component Value Date   WBC 6.2 03/29/2023   HGB 13.0 03/29/2023   HCT 40.9 03/29/2023   MCV 86.3 03/29/2023   PLT 160 03/29/2023   Lab Results  Component Value Date   CHOL 193 03/29/2023   HDL 78 03/29/2023   LDLCALC 103 (H) 03/29/2023   TRIG 42 03/29/2023   CHOLHDL 2.5 03/29/2023   Lab Results  Component Value Date   HGBA1C 5.6 03/29/2023    Lab Results  Component Value Date   TSH 0.43 03/29/2023    Assessment & Plan:   Anxiety; Situational Stress: treated with Xanax  0.5 mg,  and quarters a  10 mg Melatonin to sleep. For the past year her daughter has been living with her. This reportedly has been causing significant stress for her due to the quality of the their relationship and her daughter does not contribute much to the household. She has told her daughter that she does not want to live with her, but her daughter has ignored this. She hasn't been sleeping well, about 5 hours a night: she takes her Melatonin to relax, and then around 1 AM goes down to 3-4 AM.  She is not willing to confront her daughter. Patient does not seem willing to go to counseling for situational stress. I am reluctant to increase Dose of Xanax  at this time.  Discussed Boniva  - Bone Density 2019 T-score -3.0, Osteoporosis treated with Boniva  150 mg monthly. I had decided to discontinue this due to her age and last bone density did not indicate improvement, however she would like to continue. Refill Boniva   Hypothyroidism treated with thyroid  replacement medication  Hypertension treated with losartan  and metoprolol  and lasix   Hx of CHF with reduced ejection fraction treated wth Jardiance , Toprol . Lasix   COPD treated with Breztri   GERD treated with omeprazole     I,Emily Lagle,acting as a scribe for Ronal JINNY Hailstone, MD.,have documented all relevant documentation on the behalf of Ronal JINNY Hailstone, MD,as directed by  Ronal JINNY Hailstone, MD while in the presence of Ronal JINNY Hailstone, MD.   ***

## 2023-08-09 NOTE — Progress Notes (Signed)
 Patient Care Team: Perri Ronal PARAS, MD as PCP - General (Internal Medicine) Thukkani, Arun K, MD as PCP - Cardiology (Cardiology)  Visit Date: 08/09/23  Subjective:   Chief Complaint  Patient presents with   Fatigue   Patient PI:Terri Bridges, Blatz DOB:1934/05/16,89 y.o. FMW:[id number]   89 y.o.Female presents today for acute sick visit with Fatigue. Patient has a past medical history of Anxiety treated with Xanax  0.5 mg, and quarters a 10 mg Melatonin to sleep. Says that she believes she may be depressed because for the past year her daughter, Luke, has been living with her after her husband died. This reportedly has been causing significant stress for her due to the quality of the their relationship and her daughter does not contribute much to the household. Says that she has told her daughter that she does not want to live with her, she would like to live alone again, but her daughter has ignored this. She admits that she hasn't been sleeping well, about 5 hours a night: she takes her Melatonin to relax, and then around 1 AM goes down to 3-4 AM.   Discussed Boniva  - Bone Density 2019 T-score -3, Osteoporosis treated with Boniva  150 mg monthly.  Past Medical History:  Diagnosis Date   Allergic rhinitis    Allergy     uses Flonase  daily as needed   Anxiety    takes Xanax  daily as needed   Arthritis    back (04/21/2013)   Chronic lower back pain    Constipation    takes an OTC stool softener   Depression    takes Zoloft  daily   Dry eyes    uses eye drops   Exertional shortness of breath    GERD (gastroesophageal reflux disease)    takes Omeprazole  daily and Protonix  daily as needed   H/O hiatal hernia    Headache(784.0)    sinus   Heart murmur    years ago   History of bronchitis    last time many yrs ago   Hypertension    takes Losartan  and Cardizem  daily   Hypothyroidism    takes Synthroid  daily   Joint pain    Joint swelling    Nocturia    Osteoporosis     Pressure in chest 08/23/13   fell and started having chest pressure, will have ECHO and stress test 09/04/13 before surgery   Ringing in ears    sees Dr.Byers for this   Scoliosis    Urinary frequency    Urinary incontinence    Urinary urgency     Allergies  Allergen Reactions   Doxycycline  Rash    Family History  Problem Relation Age of Onset   Heart failure Mother    Heart failure Father    Hypertension Sister    Neuropathy Neg Hx    Social History   Social History Narrative   Originally from [place].    Previously worked in Production designer, theatre/television/film for [employer].    Has 2 dogs currently.    Remote exposure to parrots in her current home.   No known mold exposure.    Remote travel to [facility] & [place].   Has 1 indoor plant.    Caffeine use: No soda   2 cups coffee every morning.   Divorced and resides alone. 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  Prior to that she worked at [employer].  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 She gets her lot rent for free in exchange for doing this work and she feels safe there. She does not smoke - quit 03/1991 - and does not consume alcohol. In December 2014 her son suffered an arrest in the backyard and subsequently expired presumably of an MI -- he had a prior history of drug abuse. Her granddaughter, who has a leg deformity and has had multiple surgeries at [facility] in [place] , has moved to the [place] area with her boyfriend and now has a baby.   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         Review of Systems  Constitutional:  Negative for malaise/fatigue.  Cardiovascular:  Positive for palpitations.  Psychiatric/Behavioral:  The patient is nervous/anxious.        (+) Stress  All other systems reviewed and are negative.    Objective:  Vitals: BP 110/80   Pulse 80    Temp 98.7 F (37.1 C)   Ht 5' 3 (1.6 m)   Wt 119 lb (54 kg)   SpO2 96%   BMI 21.08 kg/m   Physical Exam Vitals and nursing note reviewed.  Constitutional:      General: She is not in acute distress.    Appearance: Normal appearance. She is not toxic-appearing.  HENT:     Head: Normocephalic and atraumatic.  Pulmonary:     Effort: Pulmonary effort is normal.  Skin:    General: Skin is warm and dry.     Findings: Ecchymosis present.  Neurological:     Mental Status: She is alert and oriented to person, place, and time. Mental status is at baseline.  Psychiatric:        Mood and Affect: Mood normal.        Behavior: Behavior normal.        Thought Content: Thought content normal.        Judgment: Judgment normal.     Results:  Studies Obtained And Personally Reviewed By Me: Labs:     Component Value Date/Time   NA 143 03/29/2023 0955   NA 143 11/03/2021 1207   K 4.4 03/29/2023 0955   CL 106 03/29/2023 0955   CO2 28 03/29/2023 0955   GLUCOSE 79 03/29/2023 0955   BUN 28 (H) 03/29/2023 0955   BUN 24 11/03/2021 1207   CREATININE 1.54 (H) 03/29/2023 0955   CALCIUM  9.2 03/29/2023 0955   PROT 6.3 03/29/2023 0955   ALBUMIN 3.7 01/20/2016 1027   AST 17 03/29/2023 0955   ALT 10 03/29/2023 0955   ALKPHOS 67 01/20/2016 1027   BILITOT 0.6 03/29/2023 0955   GFRNONAA 34 (L) 03/18/2020 1209   GFRAA 40 (L) 03/18/2020 1209    Lab Results  Component Value Date   WBC 6.2 03/29/2023   HGB 13.0 03/29/2023   HCT 40.9 03/29/2023   MCV 86.3 03/29/2023   PLT 160 03/29/2023   Lab Results  Component Value Date   CHOL 193 03/29/2023   HDL 78 03/29/2023   LDLCALC 103 (H) 03/29/2023   TRIG 42 03/29/2023   CHOLHDL 2.5 03/29/2023   Lab Results  Component Value Date   HGBA1C 5.6 03/29/2023    Lab Results  Component Value Date   TSH 0.43 03/29/2023    Assessment & Plan:   Anxiety; Situational Stress: treated with Xanax  0.5 mg, and quarters a 10 mg  Melatonin to sleep. For the  past year her daughter has been living with her. This reportedly has been causing significant stress for her due to the quality of the their relationship and her daughter does not contribute much to the household. She has told her daughter that she does not want to live with her, but her daughter has ignored this. She hasn't been sleeping well, about 5 hours a night: she takes her Melatonin to relax, and then around 1 AM goes down to 3-4 AM.    Discussed Boniva  - Bone Density 2019 T-score -3, Osteoporosis treated with Boniva  150 mg monthly. I had decided to discontinue this due to her age and last bone density did not indicate improvement, however she would like to continue.    I,Emily Lagle,acting as a Neurosurgeon for Ronal JINNY Hailstone, MD.,have documented all relevant documentation on the behalf of Ronal JINNY Hailstone, MD,as directed by  Ronal JINNY Hailstone, MD while in the presence of Ronal JINNY Hailstone, MD.   ***

## 2023-08-10 NOTE — Patient Instructions (Signed)
 Suggested counseling but patient not interested in going. I do not think it is wise to increase dose of Xanax  at her age. Family members may be of help in confronting her daughter and getting her some help and moving her out of patient's residence. Boniva  refilled at pt. Request.

## 2023-09-03 ENCOUNTER — Other Ambulatory Visit: Payer: Self-pay | Admitting: Internal Medicine

## 2023-09-11 ENCOUNTER — Other Ambulatory Visit: Payer: Self-pay | Admitting: Internal Medicine

## 2023-09-28 ENCOUNTER — Other Ambulatory Visit: Payer: Self-pay | Admitting: Internal Medicine

## 2023-10-03 ENCOUNTER — Ambulatory Visit: Payer: Self-pay | Admitting: Primary Care

## 2023-10-03 NOTE — Telephone Encounter (Signed)
 FYI Only or Action Required?: Action required by provider: request for appointment.  Patient is followed in Pulmonology for COPD, last seen on 04/16/2023 by Hope Almarie ORN, NP.  Called Nurse Triage reporting Cough.  Symptoms began several weeks ago.  Interventions attempted: Maintenance inhaler and Nebulizer treatments.  Symptoms are: gradually worsening.  Triage Disposition: See Physician Within 24 Hours  Patient/caregiver understands and will follow disposition?: No, wishes to speak with PCP     Copied from CRM #[id number]. Topic: Clinical - Red Word Triage >> Oct 03, 2023 12:28 PM Devaughn RAMAN wrote: Red Word that prompted transfer to Nurse Triage: Coughing up yellow and green mucus. Patient stated she has to use her inhaler more frequently.       Reason for Disposition  [1] Known COPD or other severe lung disease (i.e., bronchiectasis, cystic fibrosis, lung surgery) AND [2] symptoms getting worse (i.e., increased sputum purulence or amount, increased breathing difficulty  Answer Assessment - Initial Assessment Questions Patient offered an appointment but she only wants to see Almarie Hope. She stated she will try to reach out to her herself and disconnected the call.      1. ONSET: When did the cough begin?      3 weeks ago  2. SEVERITY: How bad is the cough today?      Moderate  3. SPUTUM: Describe the color of your sputum (e.g., none, dry cough; clear, white, yellow, green)     Yellow and green 4. HEMOPTYSIS: Are you coughing up any blood? If Yes, ask: How much? (e.g., flecks, streaks, tablespoons, etc.)     No 5. DIFFICULTY BREATHING: Are you having difficulty breathing? If Yes, ask: How bad is it? (e.g., mild, moderate, severe)      Mild  6. FEVER: Do you have a fever? If Yes, ask: What is your temperature, how was it measured, and when did it start?     No 7. CARDIAC HISTORY: Do you have any history of heart disease? (e.g., heart attack,  congestive heart failure)      Yes 8. LUNG HISTORY: Do you have any history of lung disease?  (e.g., pulmonary embolus, asthma, emphysema)     COPD 9. PE RISK FACTORS: Do you have a history of blood clots? (or: recent major surgery, recent prolonged travel, bedridden)     No 10. OTHER SYMPTOMS: Do you have any other symptoms? (e.g., runny nose, wheezing, chest pain)       No  Protocols used: Cough - Acute Productive-A-AH

## 2023-10-09 ENCOUNTER — Other Ambulatory Visit

## 2023-10-09 DIAGNOSIS — R7302 Impaired glucose tolerance (oral): Secondary | ICD-10-CM

## 2023-10-10 ENCOUNTER — Ambulatory Visit: Payer: Self-pay | Admitting: Internal Medicine

## 2023-10-10 LAB — HEMOGLOBIN A1C
Hgb A1c MFr Bld: 5.5 % (ref <5.7)
Mean Plasma Glucose: 111 mg/dL
eAG (mmol/L): 6.2 mmol/L

## 2023-10-11 ENCOUNTER — Encounter: Payer: Self-pay | Admitting: Internal Medicine

## 2023-10-11 ENCOUNTER — Ambulatory Visit: Admitting: Internal Medicine

## 2023-10-11 VITALS — BP 130/80 | HR 68 | Ht 63.0 in | Wt 118.0 lb

## 2023-10-11 DIAGNOSIS — I10 Essential (primary) hypertension: Secondary | ICD-10-CM

## 2023-10-11 DIAGNOSIS — E039 Hypothyroidism, unspecified: Secondary | ICD-10-CM

## 2023-10-11 DIAGNOSIS — M1711 Unilateral primary osteoarthritis, right knee: Secondary | ICD-10-CM

## 2023-10-11 DIAGNOSIS — I8393 Asymptomatic varicose veins of bilateral lower extremities: Secondary | ICD-10-CM

## 2023-10-11 DIAGNOSIS — M810 Age-related osteoporosis without current pathological fracture: Secondary | ICD-10-CM

## 2023-10-11 DIAGNOSIS — Z8709 Personal history of other diseases of the respiratory system: Secondary | ICD-10-CM | POA: Diagnosis not present

## 2023-10-11 DIAGNOSIS — F5105 Insomnia due to other mental disorder: Secondary | ICD-10-CM

## 2023-10-11 DIAGNOSIS — R7302 Impaired glucose tolerance (oral): Secondary | ICD-10-CM | POA: Diagnosis not present

## 2023-10-11 DIAGNOSIS — F419 Anxiety disorder, unspecified: Secondary | ICD-10-CM

## 2023-10-11 DIAGNOSIS — K219 Gastro-esophageal reflux disease without esophagitis: Secondary | ICD-10-CM

## 2023-10-11 DIAGNOSIS — F32A Depression, unspecified: Secondary | ICD-10-CM

## 2023-10-11 DIAGNOSIS — N184 Chronic kidney disease, stage 4 (severe): Secondary | ICD-10-CM

## 2023-10-11 DIAGNOSIS — F409 Phobic anxiety disorder, unspecified: Secondary | ICD-10-CM

## 2023-10-11 DIAGNOSIS — I340 Nonrheumatic mitral (valve) insufficiency: Secondary | ICD-10-CM

## 2023-10-11 NOTE — Progress Notes (Unsigned)
 Cardiology Office Note:  .   Date:  10/12/2023  ID:  [name], DOB October 18, 1934, MRN [id number] PCP: Perri Ronal PARAS, MD  [facility] Providers Cardiologist:  Lurena MARLA Red, MD {  History of Present Illness: Terri   Der Gagliano Bridges is a 89 y.o. female with a past medical history of dilated cardiomyopathy, bifascicular block, stage III CKD, HLD, aortic atherosclerosis, nonrheumatic MVR, and hypertension here for follow-up appointment.  She was last seen February 2024 and at that time she had a TTE performed and her Jardiance  was increased to 25 mg a day.  It was mentioned that the patient is against invasive procedures such as coronary angiography and ICD implantation and would like to be treated conservatively with medical therapy.   She was seen by me 11/09/2022, she presents with a history of heart disease managed with Jardiance .   She reports a recent increase in Jardiance  to 25mg , which she declined due to concerns about the cost and the effectiveness of her current regimen. She has been managing with 10mg  Jardiance , which she reports is working well for her. She denies fluid retention or dyspnea.  In addition to her heart disease, the patient recently had a cough and congestion, which she initially thought was a cold. However, due to the persistence of her symptoms, she sought medical attention and was prescribed azithromycin . She reports that her symptoms are improving and she has a few more doses of the antibiotic to complete.  Reports no shortness of breath nor dyspnea on exertion. Reports no chest pain, pressure, or tightness. No edema, orthopnea, PND. Reports no palpitations.   Today, she presents with a hx of mitral valve regurgitation who presents for cardiovascular follow-up. She is accompanied by her daughter, who has moved in with her.  She experiences occasional palpitations and a heaviness in her chest without soreness on the very left side. Her mitral  valve regurgitation was previously assessed as mild to moderate, with no echocardiogram since February 2024.  She takes Jardiance  10 mg, with some cost concerns. She manages her blood pressure at home and experiences occasional lightheadedness, particularly when standing quickly.  Her daughter moving in has increased her anxiety, prompting her to seek medication for sleep. 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  Reports no shortness of breath nor dyspnea on exertion. Reports no chest pain, pressure, or tightness. No edema, orthopnea, PND.   Discussed the use of AI scribe software for clinical note transcription with the patient, who gave verbal consent to proceed.  ROS: Pertinent ROS in HPI  Studies Reviewed: Terri        Prior CV studies:   TTE 2023: Ejection fraction of 30 to 35% with mild to moderate mitral regurgitation   30-day monitor 2023: Predominantly sinus rhythm with no atrial fibrillation, sustained ventricular tachyarrhythmias, or bradycardia arrhythmias with occasional nonsustained ventricular tachycardia, PVCs, and PACs.       Physical Exam:   VS:  BP (!) 154/83 (BP Location: Left Arm, Patient Position: Sitting, Cuff Size: Normal)   Pulse 66   Ht 5' 3 (1.6 m)   Wt 118 lb 12.8 oz (53.9 kg)   SpO2 97%   BMI 21.04 kg/m    Wt Readings from Last 3 Encounters:  10/12/23 118 lb 12.8 oz (53.9 kg)  10/11/23 118 lb (53.5 kg)  08/09/23 119 lb (54 kg)    GEN: Well nourished, well developed in no acute distress NECK: No JVD; No carotid  bruits CARDIAC: RRR, no murmurs, rubs, gallops RESPIRATORY:  Clear to auscultation without rales, wheezing or rhonchi  ABDOMEN: Soft, non-tender, non-distended EXTREMITIES:  No edema; No deformity   ASSESSMENT AND PLAN: .    Nonrheumatic mitral valve insufficiency (mild to moderate) Mild to moderate mitral valve insufficiency. Monitoring is necessary. - Order echocardiogram to assess mitral valve function.  Dilated  cardiomyopathy No changes in management during this visit.  Premature ventricular contractions Occasional PVCs on EKG, consistent with previous findings. No significant symptoms necessitating intervention. -if she becomes symptomatic or if they become more frequent may require monitor  Essential hypertension Blood pressure slightly elevated but not concerning given age. - Continue current antihypertensive regimen. - Monitor blood pressure at home and report significant increases.  Venous stasis changes of lower extremities Venous stasis changes likely due to age-related venous insufficiency. Discussed compression garments for arms due to bruising.     Hyperlipidemia - Lipid panel from March showed LDL 103, HDL 78, total cholesterol 806, triglycerides 42. - Statins deferred  Dispo: She can follow-up in 6 months with Dr. Wendel  Signed, Orren LOISE Fabry, PA-C

## 2023-10-11 NOTE — Progress Notes (Signed)
 Patient Care Team: Perri Ronal PARAS, MD as PCP - General (Internal Medicine) Thukkani, Arun K, MD as PCP - Cardiology (Cardiology)  Visit Date: 10/11/23  Subjective:    Patient ID: Terri Bridges [facility] , Female   DOB: 07-06-1934, 89 y.o.    MRN: [id number]   89 y.o. Female presents today for 6 month follow up . Patient has a past medical history of Chronic Depression, Hypertension, Hypothyroidism, Allergic Rhinitis, GE Reflux, Recurrent Sinusitis, COPD, and Osteoporosis. .  Says that she's doing well despite multiple medical issues. Was able to get her daughter to another residence which was good as she now has some peace and privacy.   History of Hypertension treated with Metoprolol  succinate 12.5 mg nightly, Losartan  100 mg nightly, and Lasix  20 mg daily. Blood Pressure normal today at 130/80.    History of Mixed Obstructive, Restrictive Lung Disease; Asthma managed with Breztri  inhaler twice daily and Proventil  inhaler as needed for wheezing. Followed by Pulmonology.    History of Anxiety treated with Xanax  0.5 mg nightly.    History of Glucose intolerance, 10/09/2023 HgbA1c 5.5% decreased from 03/29/2023 HgbA1c 5.6%    History of GE Reflux treated with Prilosec 20 mg daily.    History of Hypothyroidism treated with Levothyroxine  100 mcg daily. 03/29/2023 TSH: 0.43.    History of Chronic Kidney Disease stage 4 treated with Jardiance  10 mg daily. Followed by [facility]. Last seen in April.  Renal US  10/2022 showed no hydronephrosis and mildly complex left renal cyst measuring 4 cm. She says that she only drinks water.   Vaccine counseling: Influenza vaccine received today.  Past Medical History:  Diagnosis Date   Allergic rhinitis    Allergy     uses Flonase  daily as needed   Anxiety    takes Xanax  daily as needed   Arthritis    back (04/21/2013)   Chronic lower back pain    Constipation    takes an OTC stool softener   Depression    takes Zoloft  daily    Dry eyes    uses eye drops   Exertional shortness of breath    GERD (gastroesophageal reflux disease)    takes Omeprazole  daily and Protonix  daily as needed   H/O hiatal hernia    Headache(784.0)    sinus   Heart murmur    years ago   History of bronchitis    last time many yrs ago   Hypertension    takes Losartan  and Cardizem  daily   Hypothyroidism    takes Synthroid  daily   Joint pain    Joint swelling    Nocturia    Osteoporosis    Pressure in chest 08/23/13   fell and started having chest pressure, will have ECHO and stress test 09/04/13 before surgery   Ringing in ears    sees Dr.Byers for this   Scoliosis    Urinary frequency    Urinary incontinence    Urinary urgency      Family History  Problem Relation Age of Onset   Heart failure Mother    Heart failure Father    Hypertension Sister    Neuropathy Neg Hx   Father, deceased aged 41, of an MI.   Mother, deceased aged 78, of an MI. Sister deceased in May 20, 2022at age 20 due to age and debilitation.  Son, in December 2014 suffered an arrest in the backyard and subsequently expired presumably of an MI, with hx/ of Drug Abuse  Granddaughter who has a leg deformity   Social History   Social History Narrative   Originally from [place].    Previously worked in Production designer, theatre/television/film for [employer].    Has 2 dogs currently.    Remote exposure to parrots in her current home.   No known mold exposure.    Remote travel to [facility] & [place].   Has 1 indoor plant.    Caffeine use: No soda   2 cups coffee every morning.   Divorced and resides alone. 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  Prior to that she worked at [employer].  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 She gets her lot rent for free in exchange for doing this work and she feels safe there. She does not smoke - quit 03/1991 - and does not consume alcohol. In  December 2014 her son suffered an arrest in the backyard and subsequently expired presumably of an MI -- he had a prior history of drug abuse. Her granddaughter, who has a leg deformity and has had multiple surgeries at [facility] in [place] , has moved to the [place] area with her boyfriend and now has a baby.   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            Review of Systems  All other systems reviewed and are negative.       Objective:   Vitals: BP 130/80   Pulse 68   Ht 5' 3 (1.6 m)   Wt 118 lb (53.5 kg)   SpO2 97%   BMI 20.90 kg/m    Physical Exam Vitals and nursing note reviewed.  Constitutional:      General: She is not in acute distress.    Appearance: Normal appearance. She is not toxic-appearing.  HENT:     Head: Normocephalic and atraumatic.  Neck:     Vascular: No carotid bruit.     Comments: Presbyesophagus  Cardiovascular:     Rate and Rhythm: Normal rate and regular rhythm. No extrasystoles are present.    Pulses: Normal pulses.     Heart sounds: Normal heart sounds. No murmur heard.    No friction rub. No gallop.  Pulmonary:     Effort: Pulmonary effort is normal. No respiratory distress.     Breath sounds: Normal breath sounds. No wheezing or rales.  Musculoskeletal:     Comments: Contusion left lower leg, minor abrasions.   Skin:    General: Skin is warm and dry.  Neurological:     Mental Status: She is alert and oriented to person, place, and time. Mental status is at baseline.  Psychiatric:        Mood and Affect: Mood normal.        Behavior: Behavior normal.        Thought Content: Thought content normal.        Judgment: Judgment normal.       Results:       Labs:       Component Value Date/Time   NA 143 03/29/2023 0955   NA 143 11/03/2021 1207   K 4.4 03/29/2023 0955   CL 106 03/29/2023 0955   CO2 28 03/29/2023 0955   GLUCOSE 79 03/29/2023 0955   BUN 28  (H) 03/29/2023 0955   BUN 24 11/03/2021  1207   CREATININE 1.54 (H) 03/29/2023 0955   CALCIUM  9.2 03/29/2023 0955   PROT 6.3 03/29/2023 0955   ALBUMIN 3.7 01/20/2016 1027   AST 17 03/29/2023 0955   ALT 10 03/29/2023 0955   ALKPHOS 67 01/20/2016 1027   BILITOT 0.6 03/29/2023 0955   GFRNONAA 34 (L) 03/18/2020 1209   GFRAA 40 (L) 03/18/2020 1209     Lab Results  Component Value Date   WBC 6.2 03/29/2023   HGB 13.0 03/29/2023   HCT 40.9 03/29/2023   MCV 86.3 03/29/2023   PLT 160 03/29/2023    Lab Results  Component Value Date   CHOL 193 03/29/2023   HDL 78 03/29/2023   LDLCALC 103 (H) 03/29/2023   TRIG 42 03/29/2023   CHOLHDL 2.5 03/29/2023    Lab Results  Component Value Date   HGBA1C 5.5 10/09/2023     Lab Results  Component Value Date   TSH 0.43 03/29/2023        Assessment & Plan:   Hypertension: treated with Metoprolol  succinate 12.5 mg nightly, Losartan  100 mg nightly, and Lasix  20 mg daily. Blood Pressure normal today at 130/80.    Mixed Obstructive, Restrictive Lung Disease; Asthma: managed with Breztri  inhaler twice daily and Proventil  inhaler as needed for wheezing. Followed by Pulmonology.   Anxiety: treated with Xanax  0.5 mg nightly.    Glucose intolerance: 10/09/2023 HgbA1c 5.5% decreased from 03/29/2023  HgbA1c 5.6%    GE Reflux: treated with Prilosec 20 mg daily.    Hypothyroidism: treated with Levothyroxine  100 mcg daily. 03/29/2023 TSH: 0.43.    Chronic Kidney Disease stage 4: treated with Jardiance  10 mg daily. Followed by [facility]. Renal US  10/2022 showed no hydronephrosis and mildly complex left renal cyst measuring 4 cm. She says that she only drinks water.   Vaccine counseling: Influenza vaccine received today.   Her labs are reviewed and are stable. Her affect is much brighter and she seems happier.   I,Makayla C Reid,acting as a scribe for Ronal JINNY Hailstone, MD.,have documented all relevant documentation on the behalf of  Ronal JINNY Hailstone, MD,as directed by  Ronal JINNY Hailstone, MD while in the presence of Ronal JINNY Hailstone, MD.   I, Ronal JINNY Hailstone, MD, have reviewed all documentation for this visit. The documentation on 10/11/2023 for the exam, diagnosis, procedures, and orders are all accurate and complete.

## 2023-10-12 ENCOUNTER — Encounter: Payer: Self-pay | Admitting: Physician Assistant

## 2023-10-12 ENCOUNTER — Ambulatory Visit: Attending: Physician Assistant | Admitting: Physician Assistant

## 2023-10-12 VITALS — BP 154/83 | HR 66 | Ht 63.0 in | Wt 118.8 lb

## 2023-10-12 DIAGNOSIS — I420 Dilated cardiomyopathy: Secondary | ICD-10-CM | POA: Diagnosis not present

## 2023-10-12 DIAGNOSIS — I700 Atherosclerosis of aorta: Secondary | ICD-10-CM | POA: Diagnosis not present

## 2023-10-12 DIAGNOSIS — N183 Chronic kidney disease, stage 3 unspecified: Secondary | ICD-10-CM | POA: Diagnosis not present

## 2023-10-12 DIAGNOSIS — N1832 Chronic kidney disease, stage 3b: Secondary | ICD-10-CM | POA: Diagnosis not present

## 2023-10-12 DIAGNOSIS — I10 Essential (primary) hypertension: Secondary | ICD-10-CM

## 2023-10-12 DIAGNOSIS — E785 Hyperlipidemia, unspecified: Secondary | ICD-10-CM | POA: Diagnosis not present

## 2023-10-12 DIAGNOSIS — I340 Nonrheumatic mitral (valve) insufficiency: Secondary | ICD-10-CM | POA: Diagnosis not present

## 2023-10-12 LAB — MICROALBUMIN / CREATININE URINE RATIO
Creatinine, Urine: 20 mg/dL (ref 20–275)
Microalb Creat Ratio: 15 mg/g{creat}
Microalb, Ur: 0.3 mg/dL

## 2023-10-12 NOTE — Patient Instructions (Signed)
 Medication Instructions:   Your physician recommends that you continue on your current medications as directed. Please refer to the Current Medication list given to you today.   *If you need a refill on your cardiac medications before your next appointment, please call your pharmacy*  Lab Work: NONE ORDERED  TODAY     If you have labs (blood work) drawn today and your tests are completely normal, you will receive your results only by: MyChart Message (if you have MyChart) OR A paper copy in the mail If you have any lab test that is abnormal or we need to change your treatment, we will call you to review the results.  Testing/Procedures: Your physician has requested that you have an echocardiogram. Echocardiography is a painless test that uses sound waves to create images of your heart. It provides your doctor with information about the size and shape of your heart and how well your heart's chambers and valves are working. This procedure takes approximately one hour. There are no restrictions for this procedure. Please do NOT wear cologne, perfume, aftershave, or lotions (deodorant is allowed). Please arrive 15 minutes prior to your appointment time.  Please note: We ask at that you not bring children with you during ultrasound (echo/ vascular) testing. Due to room size and safety concerns, children are not allowed in the ultrasound rooms during exams. Our front office staff cannot provide observation of children in our lobby area while testing is being conducted. An adult accompanying a patient to their appointment will only be allowed in the ultrasound room at the discretion of the ultrasound technician under special circumstances. We apologize for any inconvenience.   Follow-Up: At [facility], you and your health needs are our priority.  As part of our continuing mission to provide you with exceptional heart care, our providers are all part of one team.  This team includes your  primary Cardiologist (physician) and Advanced Practice Providers or APPs (Physician Assistants and Nurse Practitioners) who all work together to provide you with the care you need, when you need it.  Your next appointment:    6 month(s)  Provider:  Orren Fabry, PA-C       We recommend signing up for the patient portal called MyChart.  Sign up information is provided on this After Visit Summary.  MyChart is used to connect with patients for Virtual Visits (Telemedicine).  Patients are able to view lab/test results, encounter notes, upcoming appointments, etc.  Non-urgent messages can be sent to your provider as well.   To learn more about what you can do with MyChart, go to [email or web address].   Other Instructions

## 2023-10-15 DIAGNOSIS — N184 Chronic kidney disease, stage 4 (severe): Secondary | ICD-10-CM | POA: Diagnosis not present

## 2023-10-16 ENCOUNTER — Telehealth: Payer: Self-pay | Admitting: Primary Care

## 2023-10-16 NOTE — Telephone Encounter (Signed)
 Patient needs to re-apply for patient assistance for Breztri , was receiving the medication from [employer]

## 2023-10-16 NOTE — Telephone Encounter (Signed)
 ATC patient.  LVM to return call.  Sent [employer].

## 2023-10-17 ENCOUNTER — Other Ambulatory Visit: Payer: Self-pay | Admitting: Internal Medicine

## 2023-10-17 NOTE — Telephone Encounter (Signed)
 Pt called and states  her daughter dropped off a manilla envelope w/ Attn: Landry Ferrari (in red ink) and left at front desk .  Pt states she filled out her part.   Pt wondered why her inhalers had stopped. Pt states if yoou need anything else, just call.

## 2023-10-17 NOTE — Patient Instructions (Addendum)
 It was a pleasure to see you today. Please continue current medications and return in March for Medicare wellness visit.

## 2023-10-19 ENCOUNTER — Encounter: Payer: Self-pay | Admitting: Pharmacy Technician

## 2023-10-19 NOTE — Telephone Encounter (Signed)
 error

## 2023-10-22 DIAGNOSIS — I129 Hypertensive chronic kidney disease with stage 1 through stage 4 chronic kidney disease, or unspecified chronic kidney disease: Secondary | ICD-10-CM | POA: Diagnosis not present

## 2023-10-22 DIAGNOSIS — N184 Chronic kidney disease, stage 4 (severe): Secondary | ICD-10-CM | POA: Diagnosis not present

## 2023-10-22 DIAGNOSIS — N2581 Secondary hyperparathyroidism of renal origin: Secondary | ICD-10-CM | POA: Diagnosis not present

## 2023-10-22 DIAGNOSIS — I420 Dilated cardiomyopathy: Secondary | ICD-10-CM | POA: Diagnosis not present

## 2023-10-24 ENCOUNTER — Telehealth: Payer: Self-pay | Admitting: Pharmacy Technician

## 2023-10-24 NOTE — Telephone Encounter (Signed)
 Patient Advocate Encounter   The patient was approved for a [employer] grant that will help cover the cost of jardiance  Total amount awarded, 7500.  Effective: 10/14/23 - 10/12/24   APW:[id number] ERW:[id number] Hmnle:[id number] PI:[id number] [employer] ID: [id number]   Pharmacy provided with approval and processing information. Patient informed via mychart

## 2023-10-29 ENCOUNTER — Telehealth: Payer: Self-pay | Admitting: Primary Care

## 2023-10-29 NOTE — Telephone Encounter (Signed)
 Pt called to follow up on the paper work she completed to get with financial assistance for her Breztri  inhaler through [employer]. She asked that someone follow up with her.

## 2023-10-29 NOTE — Telephone Encounter (Signed)
 Copied from CRM #[id number]. Topic: General - Call Back - No Documentation >> Oct 29, 2023 11:00 AM Chantha C wrote: Reason for CRM: Patient [phone] is returning the office call, unsure of the call it may be regarding Breztri  inhalet forms. Warm transferred to CAL.

## 2023-11-12 ENCOUNTER — Ambulatory Visit: Payer: Self-pay

## 2023-11-12 NOTE — Telephone Encounter (Signed)
 Patient refused triage. Will route to office for follow up.        Copied from CRM [id number]. Topic: Clinical - Red Word Triage >> Nov 12, 2023  3:11 PM Viola FALCON wrote: Red Word that prompted transfer to Nurse Triage: Patient has blisters on left leg and swelling Answer Assessment - Initial Assessment Questions This RN spoke with  patient regarding her symptoms. She states she hit her leg and states the area will not heal and has blisters on it. She also states she is having ankle swelling. Using ointments on area for symptoms.  Patient was upset due to having to answer questions during triage process. She stated  I will just go to the emergency room and refused to answer any additional questions. Refused any additional assistance with being seen in the office. Will route to office for follow up  1. ONSET: When did the swelling start? (e.g., minutes, hours, days)     2 weeks  2. REDNESS: Is there redness or signs of infection?     Redness around the sores  3. PAIN: Is the swelling painful to touch? If Yes, ask: How painful is it?   (Scale 1-10; mild, moderate or severe)     Denies pain  Protocols used: Leg Swelling and Edema-A-AH

## 2023-11-19 ENCOUNTER — Other Ambulatory Visit: Payer: Self-pay | Admitting: Internal Medicine

## 2023-11-21 MED ORDER — EMPAGLIFLOZIN 10 MG PO TABS: 10.0000 mg | ORAL_TABLET | Freq: Every day | ORAL | 3 refills | Status: AC

## 2023-11-26 ENCOUNTER — Ambulatory Visit (HOSPITAL_COMMUNITY)
Admission: RE | Admit: 2023-11-26 | Discharge: 2023-11-26 | Disposition: A | Discharge status: Home / Self Care | Source: Ambulatory Visit | Attending: Cardiology | Admitting: Cardiology

## 2023-11-26 DIAGNOSIS — I340 Nonrheumatic mitral (valve) insufficiency: Secondary | ICD-10-CM | POA: Diagnosis present

## 2023-11-26 LAB — ECHOCARDIOGRAM COMPLETE: S' Lateral: 4.58 cm

## 2023-11-26 MED ORDER — PERFLUTREN LIPID MICROSPHERE
1.0000 mL | INTRAVENOUS | Status: AC | PRN
  Administered 2023-11-26: 2 mL via INTRAVENOUS

## 2023-11-28 ENCOUNTER — Telehealth: Payer: Self-pay | Admitting: *Deleted

## 2023-11-28 ENCOUNTER — Other Ambulatory Visit: Payer: Self-pay | Admitting: *Deleted

## 2023-11-28 ENCOUNTER — Ambulatory Visit: Payer: Self-pay | Admitting: Physician Assistant

## 2023-11-28 DIAGNOSIS — I10 Essential (primary) hypertension: Secondary | ICD-10-CM

## 2023-11-28 MED ORDER — BREZTRI AEROSPHERE 160-9-4.8 MCG/ACT IN AERO: 2.0000 | INHALATION_SPRAY | Freq: Two times a day (BID) | RESPIRATORY_TRACT | Status: AC

## 2023-11-28 NOTE — Telephone Encounter (Signed)
 Patient called and I spoke with patient, she has the patient assistance forms for Breztri  and will bring them by tomorrow when she comes to get Breztri  samples.  She has been out for some time and has only been using Albuterol  because that is all she has right now.  I let her know that when she drops off the paperwork, we will fax it to the manufacturer to get the process approved.  She verified understanding.  Samples up front for her.  Nothing further needed.`

## 2023-11-29 ENCOUNTER — Telehealth: Payer: Self-pay | Admitting: Primary Care

## 2023-11-29 MED ORDER — SPIRONOLACTONE 25 MG PO TABS: 12.5000 mg | ORAL_TABLET | Freq: Every day | ORAL | 1 refills | Status: AC

## 2023-11-29 NOTE — Telephone Encounter (Signed)
 Patient called and I spoke with patient, she has the patient assistance forms for Breztri  and will bring them by tomorrow when she comes to get Breztri  samples.  She has been out for some time and has only been using Albuterol  because that is all she has right now.  I let her know that when she drops off the paperwork, we will fax it to the manufacturer to get the process approved.  She verified understanding.  Samples up front for her.  Nothing further needed.`

## 2023-11-29 NOTE — Telephone Encounter (Signed)
 [employer] papers received. I will fill out the providers part and have Almarie Ferrari, NP sign this when she is back in office tomorrow.

## 2023-11-29 NOTE — Telephone Encounter (Signed)
 Patient has dropped off [place] [place] forms. They are placed in [facility] NP's box.

## 2023-11-29 NOTE — Telephone Encounter (Signed)
 Labs ordered and new rx sent in.

## 2023-12-03 ENCOUNTER — Telehealth: Payer: Self-pay | Admitting: Physician Assistant

## 2023-12-03 NOTE — Telephone Encounter (Signed)
 Pt contacted and advised, pt reports that she will start taking it in the morning.

## 2023-12-03 NOTE — Telephone Encounter (Signed)
 Pt c/o medication issue:  1. Name of Medication:   spironolactone  (ALDACTONE ) 25 MG tablet    2. How are you currently taking this medication (dosage and times per day)? As written   3. Are you having a reaction (difficulty breathing--STAT)? No   4. What is your medication issue? Pt called in to ask what's the best time to take this med.

## 2023-12-03 NOTE — Telephone Encounter (Signed)
 Is there a certain time of day that you prefer for this patient to take spironolactone ?

## 2023-12-05 ENCOUNTER — Other Ambulatory Visit: Payer: Self-pay

## 2023-12-05 MED ORDER — BREZTRI AEROSPHERE 160-9-4.8 MCG/ACT IN AERO: 2.0000 | INHALATION_SPRAY | Freq: Two times a day (BID) | RESPIRATORY_TRACT | 3 refills | Status: AC

## 2023-12-15 ENCOUNTER — Other Ambulatory Visit: Payer: Self-pay | Admitting: Internal Medicine

## 2023-12-19 NOTE — Telephone Encounter (Signed)
 Pt is calling to check status of refill. She is out of medication

## 2024-04-04 ENCOUNTER — Other Ambulatory Visit: Payer: Self-pay

## 2024-04-08 ENCOUNTER — Ambulatory Visit: Payer: Self-pay | Admitting: Internal Medicine
